# Patient Record
Sex: Female | Born: 1940
Health system: Southern US, Community
[De-identification: ages and names within clinical notes are randomized; demographics above are authoritative.]

## PROBLEM LIST (undated history)

## (undated) DIAGNOSIS — R600 Localized edema: Secondary | ICD-10-CM

## (undated) DIAGNOSIS — H35033 Hypertensive retinopathy, bilateral: Secondary | ICD-10-CM

## (undated) DIAGNOSIS — Z9889 Other specified postprocedural states: Secondary | ICD-10-CM

## (undated) DIAGNOSIS — Z9289 Personal history of other medical treatment: Secondary | ICD-10-CM

## (undated) DIAGNOSIS — I350 Nonrheumatic aortic (valve) stenosis: Secondary | ICD-10-CM

## (undated) DIAGNOSIS — K220 Achalasia of cardia: Secondary | ICD-10-CM

## (undated) DIAGNOSIS — M1990 Unspecified osteoarthritis, unspecified site: Secondary | ICD-10-CM

## (undated) DIAGNOSIS — Z8669 Personal history of other diseases of the nervous system and sense organs: Secondary | ICD-10-CM

## (undated) DIAGNOSIS — K219 Gastro-esophageal reflux disease without esophagitis: Secondary | ICD-10-CM

## (undated) DIAGNOSIS — D649 Anemia, unspecified: Secondary | ICD-10-CM

## (undated) DIAGNOSIS — R112 Nausea with vomiting, unspecified: Secondary | ICD-10-CM

## (undated) DIAGNOSIS — I10 Essential (primary) hypertension: Secondary | ICD-10-CM

## (undated) DIAGNOSIS — I4892 Unspecified atrial flutter: Secondary | ICD-10-CM

## (undated) DIAGNOSIS — M62838 Other muscle spasm: Secondary | ICD-10-CM

## (undated) DIAGNOSIS — K5900 Constipation, unspecified: Secondary | ICD-10-CM

## (undated) DIAGNOSIS — I051 Rheumatic mitral insufficiency: Secondary | ICD-10-CM

## (undated) DIAGNOSIS — I099 Rheumatic heart disease, unspecified: Secondary | ICD-10-CM

## (undated) DIAGNOSIS — R609 Edema, unspecified: Secondary | ICD-10-CM

## (undated) DIAGNOSIS — R351 Nocturia: Secondary | ICD-10-CM

## (undated) DIAGNOSIS — Z8619 Personal history of other infectious and parasitic diseases: Secondary | ICD-10-CM

## (undated) DIAGNOSIS — R011 Cardiac murmur, unspecified: Secondary | ICD-10-CM

## (undated) HISTORY — PX: ABDOMINAL HYSTERECTOMY: SHX81

## (undated) HISTORY — DX: Achalasia of cardia: K22.0

## (undated) HISTORY — DX: Unspecified atrial flutter: I48.92

## (undated) HISTORY — DX: Hypertensive retinopathy, bilateral: H35.033

## (undated) HISTORY — PX: MULTIPLE TOOTH EXTRACTIONS: SHX2053

## (undated) HISTORY — DX: Rheumatic heart disease, unspecified: I09.9

## (undated) HISTORY — DX: Unspecified osteoarthritis, unspecified site: M19.90

## (undated) HISTORY — DX: Essential (primary) hypertension: I10

## (undated) HISTORY — PX: COLONOSCOPY W/ BIOPSIES AND POLYPECTOMY: SHX1376

## (undated) HISTORY — DX: Nonrheumatic aortic (valve) stenosis: I35.0

## (undated) HISTORY — PX: CYST EXCISION: SHX5701

## (undated) HISTORY — DX: Cardiac murmur, unspecified: R01.1

## (undated) HISTORY — DX: Rheumatic mitral insufficiency: I05.1

---

## 1997-12-11 ENCOUNTER — Encounter: Admission: RE | Admit: 1997-12-11 | Discharge: 1997-12-11 | Payer: Self-pay | Admitting: Obstetrics & Gynecology

## 1998-09-13 ENCOUNTER — Encounter: Admission: RE | Admit: 1998-09-13 | Discharge: 1998-09-13 | Payer: Self-pay | Admitting: Internal Medicine

## 1998-10-21 ENCOUNTER — Encounter: Admission: RE | Admit: 1998-10-21 | Discharge: 1998-10-21 | Payer: Self-pay | Admitting: Internal Medicine

## 1998-12-03 ENCOUNTER — Encounter: Admission: RE | Admit: 1998-12-03 | Discharge: 1998-12-03 | Payer: Self-pay | Admitting: *Deleted

## 1999-02-04 ENCOUNTER — Encounter: Admission: RE | Admit: 1999-02-04 | Discharge: 1999-02-04 | Payer: Self-pay | Admitting: Internal Medicine

## 1999-02-14 ENCOUNTER — Encounter: Admission: RE | Admit: 1999-02-14 | Discharge: 1999-02-14 | Payer: Self-pay | Admitting: Internal Medicine

## 1999-09-26 ENCOUNTER — Encounter: Admission: RE | Admit: 1999-09-26 | Discharge: 1999-09-26 | Payer: Self-pay | Admitting: Internal Medicine

## 1999-09-30 ENCOUNTER — Encounter: Admission: RE | Admit: 1999-09-30 | Discharge: 1999-09-30 | Payer: Self-pay | Admitting: Internal Medicine

## 2000-11-13 ENCOUNTER — Encounter: Admission: RE | Admit: 2000-11-13 | Discharge: 2000-11-13 | Payer: Self-pay | Admitting: Obstetrics & Gynecology

## 2000-11-22 ENCOUNTER — Encounter: Admission: RE | Admit: 2000-11-22 | Discharge: 2000-11-22 | Payer: Self-pay | Admitting: Internal Medicine

## 2000-12-04 ENCOUNTER — Ambulatory Visit (HOSPITAL_COMMUNITY): Admission: RE | Admit: 2000-12-04 | Discharge: 2000-12-04 | Payer: Self-pay

## 2000-12-07 ENCOUNTER — Encounter: Payer: Self-pay | Admitting: Obstetrics

## 2000-12-07 ENCOUNTER — Encounter: Admission: RE | Admit: 2000-12-07 | Discharge: 2000-12-07 | Payer: Self-pay | Admitting: Obstetrics

## 2000-12-07 ENCOUNTER — Encounter: Admission: RE | Admit: 2000-12-07 | Discharge: 2000-12-07 | Payer: Self-pay | Admitting: Internal Medicine

## 2001-01-31 ENCOUNTER — Encounter: Admission: RE | Admit: 2001-01-31 | Discharge: 2001-01-31 | Payer: Self-pay | Admitting: Internal Medicine

## 2001-04-26 ENCOUNTER — Encounter: Admission: RE | Admit: 2001-04-26 | Discharge: 2001-04-26 | Payer: Self-pay

## 2001-05-31 ENCOUNTER — Encounter: Admission: RE | Admit: 2001-05-31 | Discharge: 2001-05-31 | Payer: Self-pay | Admitting: Internal Medicine

## 2002-01-06 ENCOUNTER — Encounter: Admission: RE | Admit: 2002-01-06 | Discharge: 2002-01-06 | Payer: Self-pay | Admitting: Internal Medicine

## 2002-01-07 ENCOUNTER — Encounter: Admission: RE | Admit: 2002-01-07 | Discharge: 2002-01-07 | Payer: Self-pay | Admitting: Internal Medicine

## 2002-01-21 ENCOUNTER — Encounter: Payer: Self-pay | Admitting: Internal Medicine

## 2002-01-21 ENCOUNTER — Ambulatory Visit (HOSPITAL_COMMUNITY): Admission: RE | Admit: 2002-01-21 | Discharge: 2002-01-21 | Payer: Self-pay | Admitting: Internal Medicine

## 2003-04-14 ENCOUNTER — Ambulatory Visit (HOSPITAL_COMMUNITY): Admission: RE | Admit: 2003-04-14 | Discharge: 2003-04-14 | Payer: Self-pay | Admitting: Sports Medicine

## 2003-06-12 ENCOUNTER — Encounter: Admission: RE | Admit: 2003-06-12 | Discharge: 2003-06-12 | Payer: Self-pay | Admitting: Internal Medicine

## 2003-06-12 ENCOUNTER — Encounter (INDEPENDENT_AMBULATORY_CARE_PROVIDER_SITE_OTHER): Payer: Self-pay | Admitting: Internal Medicine

## 2003-06-16 ENCOUNTER — Encounter: Admission: RE | Admit: 2003-06-16 | Discharge: 2003-06-16 | Payer: Self-pay | Admitting: Internal Medicine

## 2003-07-17 ENCOUNTER — Ambulatory Visit (HOSPITAL_COMMUNITY): Admission: RE | Admit: 2003-07-17 | Discharge: 2003-07-17 | Payer: Self-pay | Admitting: Internal Medicine

## 2003-07-17 ENCOUNTER — Encounter: Admission: RE | Admit: 2003-07-17 | Discharge: 2003-07-17 | Payer: Self-pay | Admitting: Internal Medicine

## 2003-07-31 ENCOUNTER — Encounter: Admission: RE | Admit: 2003-07-31 | Discharge: 2003-07-31 | Payer: Self-pay | Admitting: Internal Medicine

## 2003-09-13 DIAGNOSIS — Z9189 Other specified personal risk factors, not elsewhere classified: Secondary | ICD-10-CM | POA: Insufficient documentation

## 2003-11-20 ENCOUNTER — Ambulatory Visit: Payer: Self-pay | Admitting: Internal Medicine

## 2004-03-30 ENCOUNTER — Encounter: Admission: RE | Admit: 2004-03-30 | Discharge: 2004-03-30 | Payer: Self-pay | Admitting: Internal Medicine

## 2004-11-17 ENCOUNTER — Encounter: Admission: RE | Admit: 2004-11-17 | Discharge: 2004-11-17 | Payer: Self-pay | Admitting: Internal Medicine

## 2004-12-07 ENCOUNTER — Ambulatory Visit: Payer: Self-pay | Admitting: Internal Medicine

## 2004-12-13 ENCOUNTER — Ambulatory Visit: Payer: Self-pay | Admitting: Internal Medicine

## 2005-11-02 ENCOUNTER — Ambulatory Visit: Payer: Self-pay | Admitting: Internal Medicine

## 2005-11-16 ENCOUNTER — Encounter (INDEPENDENT_AMBULATORY_CARE_PROVIDER_SITE_OTHER): Payer: Self-pay | Admitting: *Deleted

## 2005-11-16 ENCOUNTER — Ambulatory Visit: Payer: Self-pay | Admitting: Internal Medicine

## 2005-11-16 LAB — CONVERTED CEMR LAB
Calcium: 9.7 mg/dL (ref 8.4–10.5)
Chlamydia, DNA Probe: NEGATIVE
Cholesterol: 164 mg/dL (ref 0–200)
Creatinine, Ser: 0.6 mg/dL (ref 0.40–1.20)
GC Probe Amp, Genital: NEGATIVE
Glucose, Bld: 103 mg/dL — ABNORMAL HIGH (ref 70–99)
HDL: 41 mg/dL (ref >39)
Ketones, ur: NEGATIVE mg/dL
Leukocytes, UA: NEGATIVE
Nitrite: NEGATIVE
Sodium: 140 meq/L (ref 135–145)
Specific Gravity, Urine: 1.018 (ref 1.005–1.03)
Total CHOL/HDL Ratio: 4
Triglycerides: 84 mg/dL (ref <150)
Urobilinogen, UA: 0.2 (ref 0.0–1.0)

## 2005-11-20 ENCOUNTER — Ambulatory Visit: Payer: Self-pay | Admitting: Hospitalist

## 2005-11-20 DIAGNOSIS — I10 Essential (primary) hypertension: Secondary | ICD-10-CM | POA: Insufficient documentation

## 2005-11-20 DIAGNOSIS — K5900 Constipation, unspecified: Secondary | ICD-10-CM | POA: Insufficient documentation

## 2005-11-27 ENCOUNTER — Ambulatory Visit (HOSPITAL_COMMUNITY): Admission: RE | Admit: 2005-11-27 | Discharge: 2005-11-27 | Payer: Self-pay | Admitting: Internal Medicine

## 2005-11-30 ENCOUNTER — Ambulatory Visit: Payer: Self-pay | Admitting: Internal Medicine

## 2006-01-12 DIAGNOSIS — M8949 Other hypertrophic osteoarthropathy, multiple sites: Secondary | ICD-10-CM | POA: Insufficient documentation

## 2006-01-12 DIAGNOSIS — R7301 Impaired fasting glucose: Secondary | ICD-10-CM | POA: Insufficient documentation

## 2006-01-12 DIAGNOSIS — M159 Polyosteoarthritis, unspecified: Secondary | ICD-10-CM | POA: Insufficient documentation

## 2006-01-12 DIAGNOSIS — M150 Primary generalized (osteo)arthritis: Secondary | ICD-10-CM

## 2006-05-02 ENCOUNTER — Encounter (INDEPENDENT_AMBULATORY_CARE_PROVIDER_SITE_OTHER): Payer: Self-pay | Admitting: *Deleted

## 2006-05-02 ENCOUNTER — Ambulatory Visit: Payer: Self-pay | Admitting: Hospitalist

## 2006-05-02 DIAGNOSIS — N760 Acute vaginitis: Secondary | ICD-10-CM | POA: Insufficient documentation

## 2006-05-02 LAB — CONVERTED CEMR LAB
Chlamydia, DNA Probe: NEGATIVE
GC Probe Amp, Genital: NEGATIVE
Hemoglobin, Urine: NEGATIVE
Ketones, ur: NEGATIVE mg/dL
Nitrite: NEGATIVE
Protein, ur: NEGATIVE mg/dL
pH: 6 (ref 5.0–8.0)

## 2006-05-03 ENCOUNTER — Encounter (INDEPENDENT_AMBULATORY_CARE_PROVIDER_SITE_OTHER): Payer: Self-pay | Admitting: *Deleted

## 2006-05-03 LAB — CONVERTED CEMR LAB: Gardnerella vaginalis: NEGATIVE

## 2006-07-23 ENCOUNTER — Telehealth (INDEPENDENT_AMBULATORY_CARE_PROVIDER_SITE_OTHER): Payer: Self-pay | Admitting: *Deleted

## 2006-12-03 ENCOUNTER — Ambulatory Visit (HOSPITAL_COMMUNITY): Admission: RE | Admit: 2006-12-03 | Discharge: 2006-12-03 | Payer: Self-pay | Admitting: Internal Medicine

## 2007-02-13 ENCOUNTER — Telehealth: Payer: Self-pay | Admitting: *Deleted

## 2007-02-18 ENCOUNTER — Ambulatory Visit: Payer: Self-pay | Admitting: Internal Medicine

## 2007-02-18 ENCOUNTER — Encounter: Payer: Self-pay | Admitting: Internal Medicine

## 2007-02-19 DIAGNOSIS — E876 Hypokalemia: Secondary | ICD-10-CM | POA: Insufficient documentation

## 2007-02-19 LAB — CONVERTED CEMR LAB
ALT: 27 units/L (ref 0–35)
CO2: 24 meq/L (ref 19–32)
Calcium: 9.3 mg/dL (ref 8.4–10.5)
Candida species: POSITIVE — AB
Chloride: 104 meq/L (ref 96–112)
Cholesterol: 135 mg/dL (ref 0–200)
Creatinine, Ser: 0.72 mg/dL (ref 0.40–1.20)
Glucose, Bld: 98 mg/dL (ref 70–99)
Sodium: 143 meq/L (ref 135–145)
Total Protein: 6.7 g/dL (ref 6.0–8.3)
Trichomonal Vaginitis: NEGATIVE

## 2007-03-20 ENCOUNTER — Encounter (INDEPENDENT_AMBULATORY_CARE_PROVIDER_SITE_OTHER): Payer: Self-pay | Admitting: Internal Medicine

## 2007-03-20 ENCOUNTER — Ambulatory Visit: Payer: Self-pay | Admitting: Infectious Diseases

## 2007-03-20 LAB — CONVERTED CEMR LAB
BUN: 9 mg/dL (ref 6–23)
Calcium: 9.4 mg/dL (ref 8.4–10.5)
Creatinine, Ser: 0.59 mg/dL (ref 0.40–1.20)

## 2007-03-25 LAB — CONVERTED CEMR LAB
Candida species: NEGATIVE
Trichomonal Vaginitis: NEGATIVE

## 2007-04-02 ENCOUNTER — Ambulatory Visit: Payer: Self-pay | Admitting: Internal Medicine

## 2007-04-09 ENCOUNTER — Encounter: Payer: Self-pay | Admitting: Internal Medicine

## 2007-04-09 ENCOUNTER — Ambulatory Visit (HOSPITAL_COMMUNITY): Admission: RE | Admit: 2007-04-09 | Discharge: 2007-04-09 | Payer: Self-pay | Admitting: Internal Medicine

## 2007-04-17 ENCOUNTER — Ambulatory Visit: Payer: Self-pay | Admitting: Internal Medicine

## 2007-04-23 ENCOUNTER — Telehealth: Payer: Self-pay | Admitting: *Deleted

## 2007-04-26 ENCOUNTER — Encounter (INDEPENDENT_AMBULATORY_CARE_PROVIDER_SITE_OTHER): Payer: Self-pay | Admitting: *Deleted

## 2007-07-31 ENCOUNTER — Encounter: Payer: Self-pay | Admitting: Internal Medicine

## 2007-10-22 ENCOUNTER — Ambulatory Visit: Payer: Self-pay | Admitting: Internal Medicine

## 2007-10-28 ENCOUNTER — Encounter: Payer: Self-pay | Admitting: Infectious Disease

## 2007-12-20 ENCOUNTER — Ambulatory Visit: Payer: Self-pay | Admitting: Internal Medicine

## 2008-01-14 ENCOUNTER — Ambulatory Visit (HOSPITAL_COMMUNITY): Admission: RE | Admit: 2008-01-14 | Discharge: 2008-01-14 | Payer: Self-pay | Admitting: Internal Medicine

## 2008-02-10 ENCOUNTER — Telehealth (INDEPENDENT_AMBULATORY_CARE_PROVIDER_SITE_OTHER): Payer: Self-pay | Admitting: Internal Medicine

## 2008-04-24 ENCOUNTER — Encounter: Payer: Self-pay | Admitting: Internal Medicine

## 2008-07-08 ENCOUNTER — Encounter: Payer: Self-pay | Admitting: Internal Medicine

## 2008-07-08 ENCOUNTER — Telehealth: Payer: Self-pay | Admitting: Internal Medicine

## 2008-10-16 ENCOUNTER — Encounter: Payer: Self-pay | Admitting: Internal Medicine

## 2008-10-16 ENCOUNTER — Encounter: Admission: RE | Admit: 2008-10-16 | Discharge: 2008-10-16 | Payer: Self-pay | Admitting: Family Medicine

## 2008-10-27 ENCOUNTER — Telehealth: Payer: Self-pay | Admitting: Internal Medicine

## 2008-10-29 ENCOUNTER — Telehealth (INDEPENDENT_AMBULATORY_CARE_PROVIDER_SITE_OTHER): Payer: Self-pay | Admitting: *Deleted

## 2009-02-01 ENCOUNTER — Telehealth: Payer: Self-pay | Admitting: Internal Medicine

## 2009-02-03 ENCOUNTER — Ambulatory Visit: Payer: Self-pay | Admitting: Internal Medicine

## 2009-02-03 ENCOUNTER — Telehealth: Payer: Self-pay | Admitting: *Deleted

## 2009-02-03 LAB — CONVERTED CEMR LAB
Potassium: 3.9 meq/L (ref 3.5–5.3)
Sodium: 139 meq/L (ref 135–145)

## 2009-02-11 ENCOUNTER — Ambulatory Visit (HOSPITAL_COMMUNITY): Admission: RE | Admit: 2009-02-11 | Discharge: 2009-02-11 | Payer: Self-pay | Admitting: Internal Medicine

## 2009-07-13 ENCOUNTER — Telehealth: Payer: Self-pay | Admitting: Internal Medicine

## 2010-01-23 ENCOUNTER — Encounter: Payer: Self-pay | Admitting: Internal Medicine

## 2010-02-01 NOTE — Progress Notes (Signed)
Summary: refill/ hla  Phone Note Refill Request Message from:  Fax from Pharmacy on July 13, 2009 12:22 PM  Refills Requested: Medication #1:  NORVASC 5 MG  TABS take 1 pill by mouth daily.   Dosage confirmed as above?Dosage Confirmed   Last Refilled: 4/30 last visit and lab 02/03/2009  Initial call taken by: Marin Roberts RN,  July 13, 2009 12:23 PM  Follow-up for Phone Call        completed refill, thank you Iskra  Follow-up by: Mliss Sax MD,  July 13, 2009 2:45 PM    Prescriptions: NORVASC 5 MG  TABS (AMLODIPINE BESYLATE) take 1 pill by mouth daily.  #31 x 11   Entered and Authorized by:   Mliss Sax MD   Signed by:   Mliss Sax MD on 07/13/2009   Method used:   Electronically to        [facility]  [phone]* (retail)       [address]       [place], [place]  [address]       Ph: [phone] or [phone]       Fax: [phone]   RxID:   [id number] HYDROCHLOROTHIAZIDE 25 MG TABS (HYDROCHLOROTHIAZIDE) Take 1 tablet by mouth once a day for high blood pressure  #90 x 11   Entered and Authorized by:   Mliss Sax MD   Signed by:   Mliss Sax MD on 07/13/2009   Method used:   Electronically to        [facility]  [phone]* (retail)       [address]       [place], [place]  [address]       Ph: [phone] or [phone]       Fax: [phone]   RxID:   [id number] LISINOPRIL 40 MG TABS (LISINOPRIL) Take 1 tablet by mouth once a day for high blood pressure  #90 x 11   Entered and Authorized by:   Mliss Sax MD   Signed by:   Mliss Sax MD on 07/13/2009   Method used:   Electronically to        [facility]  [phone]* (retail)       [address]       [place], [place]  [address]       Ph: [phone] or [phone]       Fax: [phone]   RxID:   [id number] ATENOLOL 100 MG TABS (ATENOLOL) Take 1 tablet by mouth once daily for high  blood pressure  #90 x 11   Entered and Authorized by:   Mliss Sax MD   Signed by:   Mliss Sax MD on 07/13/2009   Method used:   Electronically to        [facility]  [phone]* (retail)       [address]       [place], [place]  [address]       Ph: [phone] or [phone]       Fax: [phone]   RxID:   [id number]

## 2010-02-01 NOTE — Progress Notes (Signed)
Summary: refill  Phone Note Refill Request   call about premarin in the AM.  cost $ 80  Initial call taken by: Merrie Roof RN,  February 03, 2009 5:07 PM

## 2010-02-01 NOTE — Assessment & Plan Note (Signed)
Summary: per Venita Sheffield, pt. need vignal creme refill [mkj]   Vital Signs:  Patient profile:   70 year old female Height:      64 inches (162.56 cm) Weight:      222.6 pounds (101.18 kg) BMI:     38.35 Temp:     97.1 degrees F oral Pulse rate:   57 / minute BP sitting:   133 / 79  (right arm)  Vitals Entered By: Chinita Pester RN (February 03, 2009 3:28 PM) CC: Need refill on Vaginal cream. Is Patient Diabetic? No Pain Assessment Patient in pain? no      Nutritional Status BMI of > 30 = obese  Have you ever been in a relationship where you felt threatened, hurt or afraid?No   Does patient need assistance? Functional Status Self care Ambulation Normal   Primary Care Provider:  Madelaine Etienne MD  CC:  Need refill on Vaginal cream..  History of Present Illness: 70 yo female with PMH of HTN and OA came in for regular follow up appointment and needs a refill for her Estrace cream of which she has been taking couple of years ago secondary to vaginal pruritis. Pt has noticed increased vaginal itching since last week. No associated discharge, or odour.   No other complaints or concerns today.   Preventive Screening-Counseling & Management  Alcohol-Tobacco     Alcohol drinks/day: 0     Smoking Status: never     Passive Smoke Exposure: no  Caffeine-Diet-Exercise     Does Patient Exercise: yes     Type of exercise: stretching  exercises  Bone Scan  Procedure date:  10/16/2008  Findings:      abnormal:    Current Medications (verified): 1)  Hydrochlorothiazide 25 Mg Tabs (Hydrochlorothiazide) .... Take 1 Tablet By Mouth Once A Day For High Blood Pressure 2)  Atenolol 100 Mg Tabs (Atenolol) .... Take 1 Tablet By Mouth Once Daily For High Blood Pressure 3)  Lisinopril 40 Mg Tabs (Lisinopril) .... Take 1 Tablet By Mouth Once A Day For High Blood Pressure 4)  Metamucil 30.9 % Powd (Psyllium) .... Dissolve 2 Scoops in 8 Ounces of Water and Drink Once A Day For Constipation 5)   Estrace 0.1 Mg/gm  Crea (Estradiol) .... Insert The Equivalent of 0.3 Mg Into The Vagina Using The Provided Applicator (Fill To The 3 Gram Line) Twice A Week 6)  Norvasc 5 Mg  Tabs (Amlodipine Besylate) .... Take 1 Pill By Mouth Daily. 7)  Premarin 0.625 Mg/gm Crea (Estrogens, Conjugated) .... Inject Intra-Vaginally W/applicator Once Daily  At Bedtime -3 Weeks On 1 Week Off. 8)  Caltrate 600+d 600-400 Mg-Unit Tabs (Calcium Carbonate-Vitamin D) .... 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 CV:  Denies chest pain or discomfort, fatigue, lightheadness, palpitations, and swelling of feet.  Resp:  Denies chest pain with inspiration and cough. GI:  Denies abdominal pain and change in bowel habits. GU:  Denies abnormal vaginal bleeding, decreased libido, discharge, dysuria, nocturia, urinary frequency, and urinary hesitancy; increased vaginal itching. MS:  Denies joint pain.   Impression & Recommendations:  Problem # 1:  VAGINITIS NOS (ICD-616.10) Assessment Unchanged Pt has been on Estrace vaginal cream, reports she has run out of this cream. Performed vaginal exam, did not reveal any ulcers, lesions,  discharge or odor. Pt reports that she does not use this cream regularly and that she uses it 1-2 times a week. As discussed with Dr. Meredith Pel, the dosing is adequate as patient does not use more than recommended dose.   Plan: refill Estrace  Problem # 2:  HYPERTENSION (ICD-401.9) Assessment: Unchanged  Blood pressure well controlled under current regimen. Will continue, and check BMet to assess renal function.   Her updated medication list for this problem includes:    Hydrochlorothiazide 25 Mg Tabs (Hydrochlorothiazide) .[name]... Take 1 tablet by mouth once a day for high blood pressure    Atenolol 100 Mg Tabs (Atenolol) .[name]... Take 1 tablet by mouth once daily for high blood pressure    Lisinopril 40 Mg Tabs (Lisinopril) .[name]... Take 1 tablet by mouth once a day for high blood pressure    Norvasc 5 Mg Tabs (Amlodipine besylate) .[name]... Take 1 pill by mouth daily.  BP today: 133/79 Prior BP: 139/87 (12/20/2007)  Prior 10 Yr Risk Heart Disease: 13 % (04/17/2007)  Labs Reviewed: K+: 3.9 (03/20/2007) Creat: : 0.59 (03/20/2007)   Chol: 135 (02/18/2007)   HDL: 38 (02/18/2007)   LDL: 71 (02/18/2007)   TG: 131 (02/18/2007)  Orders: T-Basic Metabolic Panel [id number])  Complete Medication List: 1)  Hydrochlorothiazide 25 Mg Tabs (Hydrochlorothiazide) .... Take 1 tablet by mouth once a day for high blood pressure 2)  Atenolol 100 Mg Tabs (Atenolol) .... Take 1 tablet by mouth once daily for high blood pressure 3)  Lisinopril 40 Mg Tabs (Lisinopril) .... Take 1 tablet by mouth once a day for high blood pressure 4)  Metamucil 30.9 % Powd (Psyllium) .... Dissolve 2 scoops in 8 ounces of water and drink once a day for constipation 5)  Estrace 0.1 Mg/gm Crea (Estradiol) .... Insert the equivalent of 0.3 mg into the vagina using the provided applicator (fill to the 3 gram line) twice a week 6)  Norvasc 5 Mg Tabs (Amlodipine besylate) .... Take 1 pill by mouth daily. 7)  Premarin 0.625 Mg/gm Crea  (Estrogens, conjugated) .... Inject intra-vaginally w/applicator once daily  at bedtime -3 weeks on 1 week off. 8)  Caltrate 600+d 600-400 Mg-unit Tabs (Calcium carbonate-vitamin d) .... Take 1 tablet by mouth once a day  Other Orders: Mammogram (Screening) (Mammo)  Patient Instructions: 1)  Please schedule a follow-up appointment in 6 months. 2)  Please take all medications as directed.  Prescriptions: PREMARIN 0.625 MG/GM CREA (ESTROGENS, CONJUGATED) Inject intra-vaginally w/applicator once daily  at bedtime -3 weeks on 1 week off.  #42.5 g x 3   Entered and Authorized by:   Melida Quitter MD   Signed by:   Melida Quitter MD on 02/03/2009   Method used:   Electronically to        [facility]  [phone]* (retail)       [address]       [place], [place]  [address]  Ph: [phone] or [phone]       Fax: [phone]   RxID:   [id number] NORVASC 5 MG  TABS (AMLODIPINE BESYLATE) take 1 pill by mouth daily.  #31 x 3   Entered and Authorized by:   Melida Quitter MD   Signed by:   Melida Quitter MD on 02/03/2009   Method used:   Electronically to        [facility]  [phone]* (retail)       [address]       [place], [place]  [address]       Ph: [phone] or [phone]       Fax: [phone]   RxID:   [id number] ESTRACE 0.1 MG/GM  CREA (ESTRADIOL) Insert the equivalent of 0.3 mg into the vagina using the provided applicator (fill to the 3 gram line) twice a week  #45 Grams x 0   Entered and Authorized by:   Melida Quitter MD   Signed by:   Melida Quitter MD on 02/03/2009   Method used:   Electronically to        [facility]  [phone]* (retail)       [address]       [place], [place]  [address]       Ph: [phone] or [phone]       Fax: [phone]   RxID:   [id number] LISINOPRIL 40 MG TABS (LISINOPRIL) Take 1 tablet by mouth once a day for  high blood pressure  #90 x 3   Entered and Authorized by:   Melida Quitter MD   Signed by:   Melida Quitter MD on 02/03/2009   Method used:   Electronically to        [facility]  [phone]* (retail)       [address]       [place], [place]  [address]       Ph: [phone] or [phone]       Fax: [phone]   RxID:   [id number] ATENOLOL 100 MG TABS (ATENOLOL) Take 1 tablet by mouth once daily for high blood pressure  #90 x 3   Entered and Authorized by:   Melida Quitter MD   Signed by:   Melida Quitter MD on 02/03/2009   Method used:   Electronically to        [facility]  [phone]* (retail)       [address]       [place], [place]  [address]       Ph: [phone] or [phone]       Fax: [phone]   RxID:   [id number] HYDROCHLOROTHIAZIDE 25 MG TABS (HYDROCHLOROTHIAZIDE) Take 1 tablet by mouth once a day for high blood pressure  #90 x 3   Entered and Authorized by:   Melida Quitter MD   Signed by:   Melida Quitter MD on 02/03/2009   Method used:   Electronically to        [facility]  [phone]* (retail)       [address]       [place], [place]  [address]       Ph: [phone] or [phone]       Fax: [phone]  RxID:   [id number]   Prevention & Chronic Care Immunizations   Influenza vaccine: Not documented    Tetanus booster: Not documented    Pneumococcal vaccine: Not documented    H. zoster vaccine: Not documented  Colorectal Screening   Hemoccult: Not documented    Colonoscopy: Not documented  Other Screening   Pap smear: Not documented   Pap smear action/deferral: Not indicated S/P hysterectomy  (02/03/2009)    Mammogram: ASSESSMENT: Negative - BI-RADS 1^MM DIGITAL SCREENING  (01/14/2008)   Mammogram action/deferral: Ordered  (02/03/2009)    DXA bone density scan: Not documented  Reports requested:   Last DXA report  requested.  Smoking status: never  (02/03/2009)  Lipids   Total Cholesterol: 135  (02/18/2007)   LDL: 71  (02/18/2007)   LDL Direct: Not documented   HDL: 38  (02/18/2007)   Triglycerides: 131  (02/18/2007)  Hypertension   Last Blood Pressure: 133 / 79  (02/03/2009)   Serum creatinine: 0.59  (03/20/2007)   Serum potassium 3.9  (03/20/2007)    Hypertension flowsheet reviewed?: Yes   Progress toward BP goal: At goal  Self-Management Support :    Patient will work on the following items until the next clinic visit to reach self-care goals:     Medications and monitoring: check my blood pressure, bring all of my medications to every visit  (02/03/2009)     Activity: take a 30 minute walk every day  (02/03/2009)    Hypertension self-management support: BP self-monitoring log, Written self-care plan, Education handout  (02/03/2009)   Hypertension self-care plan printed.   Hypertension education handout printed   Nursing Instructions: Schedule screening mammogram (see order) Request report of last DXA scan   Process Orders Check Orders Results:     [facility]: Check successful Tests Sent for requisitioning (February 03, 2009 4:28 PM):     02/03/2009: [facility] -- T-Basic Metabolic Panel [id number] (signed)    Process Orders Check Orders Results:     [facility]: Check successful Tests Sent for requisitioning (February 03, 2009 4:28 PM):     02/03/2009: [facility] -- T-Basic Metabolic Panel [id number] (signed)

## 2010-02-01 NOTE — Progress Notes (Signed)
Summary: Refill/gh  Phone Note Refill Request Message from:  Patient on February 01, 2009 3:17 PM  Refills Requested: Medication #1:  ESTRACE 0.1 MG/GM  CREA Insert the equivalent of 0.3 mg into the vagina using the provided applicator (fill to the 3 gram line) every day for 3 weeks   Dosage confirmed as above?Dosage Confirmed   Supply Requested: 1 month   Last Refilled: 11/02/2008 Pt was advised that she will need to schedule an appointment. Pt was given an appointment for 02/04/2009.  Last office visit was 12/09. Last labs were 2/09.   Method Requested: Electronic Initial call taken by: Angelina Ok RN,  February 01, 2009 3:17 PM    New/Updated Medications: ESTRACE 0.1 MG/GM  CREA (ESTRADIOL) Insert the equivalent of 0.3 mg into the vagina using the provided applicator (fill to the 3 gram line) twice a week Prescriptions: ESTRACE 0.1 MG/GM  CREA (ESTRADIOL) Insert the equivalent of 0.3 mg into the vagina using the provided applicator (fill to the 3 gram line) twice a week  #45 Grams x 0   Entered and Authorized by:   Doneen Poisson MD   Signed by:   Doneen Poisson MD on 02/01/2009   Method used:   Electronically to        [facility]  [phone]* (retail)       [address]       [place], [place]  [address]       Ph: [phone] or [phone]       Fax: [phone]   RxID:   [id number]

## 2010-02-10 ENCOUNTER — Encounter: Payer: Self-pay | Admitting: Internal Medicine

## 2010-03-04 ENCOUNTER — Other Ambulatory Visit: Payer: Self-pay | Admitting: Family Medicine

## 2010-03-04 DIAGNOSIS — Z1231 Encounter for screening mammogram for malignant neoplasm of breast: Secondary | ICD-10-CM

## 2010-03-24 ENCOUNTER — Ambulatory Visit (HOSPITAL_COMMUNITY)
Admission: RE | Admit: 2010-03-24 | Discharge: 2010-03-24 | Disposition: A | Discharge status: Home / Self Care | Payer: MEDICARE | Source: Ambulatory Visit | Attending: Family Medicine | Admitting: Family Medicine

## 2010-03-24 DIAGNOSIS — Z1231 Encounter for screening mammogram for malignant neoplasm of breast: Secondary | ICD-10-CM | POA: Insufficient documentation

## 2010-07-12 ENCOUNTER — Other Ambulatory Visit: Payer: Self-pay | Admitting: *Deleted

## 2010-07-12 MED ORDER — AMLODIPINE BESYLATE 5 MG PO TABS: 5.0000 mg | ORAL_TABLET | Freq: Every day | ORAL | Status: DC

## 2010-07-12 NOTE — Telephone Encounter (Signed)
Pt requests 90 day supply

## 2010-10-14 ENCOUNTER — Other Ambulatory Visit: Payer: Self-pay | Admitting: Internal Medicine

## 2010-10-17 NOTE — Telephone Encounter (Signed)
Has appt in couple of days. Will refill.

## 2010-10-21 ENCOUNTER — Encounter: Payer: Self-pay | Admitting: Ophthalmology

## 2010-10-21 ENCOUNTER — Ambulatory Visit (INDEPENDENT_AMBULATORY_CARE_PROVIDER_SITE_OTHER): Payer: Medicare Other | Admitting: Ophthalmology

## 2010-10-21 VITALS — BP 123/80 | HR 66 | Temp 97.6°F | Ht 64.0 in | Wt 225.1 lb

## 2010-10-21 DIAGNOSIS — Z23 Encounter for immunization: Secondary | ICD-10-CM

## 2010-10-21 DIAGNOSIS — Z0000 Encounter for general adult medical examination without abnormal findings: Secondary | ICD-10-CM

## 2010-10-21 DIAGNOSIS — M1990 Unspecified osteoarthritis, unspecified site: Secondary | ICD-10-CM

## 2010-10-21 DIAGNOSIS — I10 Essential (primary) hypertension: Secondary | ICD-10-CM

## 2010-10-21 DIAGNOSIS — M810 Age-related osteoporosis without current pathological fracture: Secondary | ICD-10-CM | POA: Insufficient documentation

## 2010-10-21 MED ORDER — LOSARTAN POTASSIUM 25 MG PO TABS: 25.0000 mg | ORAL_TABLET | Freq: Every day | ORAL | Status: DC

## 2010-10-21 NOTE — Progress Notes (Addendum)
Subjective:   Patient ID: [name] female   DOB: [birth year] 70 y.o.   MRN: [id number]  HPI: Knee Pain [name] is a 70 y.o. woman who presented to clinic today after being called during our narcotics review since there was vicodin on her prescription list that we did not prescribe from the clinic. She explained that she sees Dr. Marcene Corning of [facility] for her osteoarthritic knees. He recommended that she have bilateral knee replacements but she is hesitant to do so since she wants to keep working. He is providing knee injections Q3 months and BID vicodin. She reports that the shot and vicodin are helping with the pain quite a bit but she still has morning stiffness that takes about 30 minutes to work out. I asked if she had taken advil or aleeve to help with inflammation. She hasn't taken NSAIDs regularly. She also admits to some constipation, I advised her that this is a known side effect of vicodin. She is taking OTC dulcolax which is producing daily BM with BID dosing.  HTN  Patient called for new prescriptions and it was noted that she is taking lisinopril even though she has a documented history of allergies to ACE-inhibitors. I stopped this medication today since she does admit to having some cough managed with OTC Delsym cough syrup.    Past Medical History  Diagnosis Date  . Hypertension   . OA (osteoarthritis)   . Breast mass 2005    mammogram 2006 negative for malignancy  . Systolic murmur     per patient report she has had rheumatic fever in childhood  . Osteoporosis, post-menopausal 2010   Current Outpatient Prescriptions  Medication Sig Dispense Refill  . amLODipine (NORVASC) 5 MG tablet Take 1 tablet (5 mg total) by mouth daily.  90 tablet  11  . atenolol (TENORMIN) 100 MG tablet Take 1 tablet (100 mg total) by mouth daily.  90 tablet  1  . Calcium Carbonate-Vitamin D (CALTRATE 600+D) 600-400 MG-UNIT per tablet Take 1 tablet by mouth daily.         [name] conjugated estrogens (PREMARIN) vaginal cream Place vaginally daily. 3 weeks on and 1 week off       . losartan (COZAAR) 25 MG tablet Take 25 mg by mouth daily.        . meloxicam (MOBIC) 7.5 MG tablet Take 7.5 mg by mouth daily.        . hydrochlorothiazide (HYDRODIURIL) 25 MG tablet Take 1 tablet (25 mg total) by mouth daily.  90 tablet  1   Family History  Problem Relation Age of Onset  . Cancer Mother   . Stroke Father    History   Social History  . Marital Status: Married    Spouse Name: N/A    Number of Children: N/A  . Years of Education: N/A   Social History Main Topics  . Smoking status: Former Games developer  . Smokeless tobacco: Not on file  . Alcohol Use: No  . Drug Use: No  . Sexually Active: Yes   Other Topics Concern  . 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   Review of Systems: Respiratory: reports mild cough  Gastrointestinal: reports some constipation and morning gas that is uncomfortable Genitourinary: reports nocturia   Objective:  Physical Exam: Filed Vitals:  10/21/10 1333  BP: 123/80  Pulse: 66  Temp: 97.6 F (36.4 C)  TempSrc: Oral  Height: 5\' 4"  (1.626 m)  Weight: 225 lb 1.6 oz (102.105 kg)  SpO2: 97%   General: pleasant elderly woman sitting in chair HEENT: PERRL, left eyelid is ptotic in comparison to right Cardiac: RRR, systolic murmur Pulm: clear to auscultation bilaterally, moving normal volumes of air Abd: soft, nontender, nondistended, BS present Ext: warm and well perfused, no pedal edema. Knees have crepitus with flexion and pre-patellar fluid present bilaterally. Neuro: alert and oriented X3, cranial nerves II-XII grossly intact, strength and sensation to light touch equal in bilateral upper and lower extremities Assessment & Plan:

## 2010-10-21 NOTE — Patient Instructions (Signed)
Please start cozaar for blood pressure and STOP lisinopril. See if this helps your cough. If insurance doesn't cover please call clinic and we can switch meds. Please start mobic for knee pain and see if it helps with inflammation. You will be contacted to schedule a colonoscopy.

## 2010-10-25 ENCOUNTER — Other Ambulatory Visit: Payer: Self-pay | Admitting: *Deleted

## 2010-10-25 MED ORDER — MELOXICAM 7.5 MG PO TABS: 7.5000 mg | ORAL_TABLET | Freq: Every day | ORAL | Status: DC | PRN

## 2010-11-07 DIAGNOSIS — Z0000 Encounter for general adult medical examination without abnormal findings: Secondary | ICD-10-CM | POA: Insufficient documentation

## 2010-11-07 NOTE — Assessment & Plan Note (Signed)
Patient is 70 years old and has had 1 colonoscopy which is not documented in EPIC or centricity. Patient believes this was more than 10 years ago. Since patient has a good life expectancy I will refer her for colonoscopy. Counseled patient that losing weight would help with both her knee pain and her HTN and may decrease the amount of medications that she would need to take. She admits that her weight has crept up 5 lbs in the last year or two.

## 2010-11-07 NOTE — Assessment & Plan Note (Signed)
Pain is well managed on vicodin and presumably steroid injections from Dr. Jerl Santos. Will start mobic today and monitor patient for any signs of GI upset at follow up visit.

## 2010-11-07 NOTE — Assessment & Plan Note (Signed)
very well controlled. Stopped lisinopril and will start cozaar (ARB) since patient has documented allergy to ACE-inhibitors. Will monitor BP at next visit after this change.

## 2010-11-28 ENCOUNTER — Encounter: Payer: Self-pay | Admitting: Ophthalmology

## 2010-11-28 ENCOUNTER — Ambulatory Visit (INDEPENDENT_AMBULATORY_CARE_PROVIDER_SITE_OTHER): Payer: Medicare Other | Admitting: Ophthalmology

## 2010-11-28 DIAGNOSIS — M1990 Unspecified osteoarthritis, unspecified site: Secondary | ICD-10-CM

## 2010-11-28 DIAGNOSIS — I10 Essential (primary) hypertension: Secondary | ICD-10-CM

## 2010-11-28 MED ORDER — ATENOLOL 100 MG PO TABS: 100.0000 mg | ORAL_TABLET | Freq: Every day | ORAL | Status: DC

## 2010-11-28 MED ORDER — LOSARTAN POTASSIUM 100 MG PO TABS: 100.0000 mg | ORAL_TABLET | Freq: Every day | ORAL | Status: DC

## 2010-11-28 MED ORDER — HYDROCHLOROTHIAZIDE 25 MG PO TABS: 25.0000 mg | ORAL_TABLET | Freq: Every day | ORAL | Status: DC

## 2010-11-28 NOTE — Assessment & Plan Note (Signed)
Patient was on maximum dose of lisinopril so she will likely tolerate maximum dose of cozaar. Instructed patient to take 100mg  of Cozaar from today on since her BP is not well controlled after the switch to ARB.

## 2010-11-28 NOTE — Assessment & Plan Note (Signed)
Patient did not benefit from Mobic so she stopped using it. It could also contribute to her HTN, so I agree with this. She is taking tylenol OTC along with vicodin BID for pain.

## 2010-11-28 NOTE — Progress Notes (Signed)
I have reviewed and discussed the care of this patient in detail with the resident physician including pertinent patient records, physical exam findings and data. I agree with details of this encounter.   

## 2010-11-28 NOTE — Progress Notes (Signed)
  Subjective:   Patient ID: [name] female   DOB: 01/23/1940 70 y.o.   MRN: [id number]  HPI: [name] is a 70 y.o. woman with HTN who presents for 1 month follow up after being switched from lisinopril to cozaar due to cough.  BP: cough is resolved, patient is very happy and says she feel better than she has in a long time. Was started on 25mg  Cozaar.  Osteoarthritis: started taking mobic, irritated stomach and didn't help pain so stopped it for several weeks now. Is taking vicodin from outside orthopedic surgeon and takes tylenol occasionally though she assures me this is no more than 1 pill every few days.  Past Medical History  Diagnosis Date  . Hypertension   . OA (osteoarthritis)   . Breast mass 2005    mammogram 2006 negative for malignancy  . Systolic murmur     per patient report she has had rheumatic fever in childhood  . Osteoporosis, post-menopausal 2010   Current Outpatient Prescriptions  Medication Sig Dispense Refill  . ACTONEL 35 MG tablet       . amLODipine (NORVASC) 5 MG tablet Take 1 tablet (5 mg total) by mouth daily.  90 tablet  11  . atenolol (TENORMIN) 100 MG tablet Take 1 tablet (100 mg total) by mouth daily.  90 tablet  1  . Calcium Carbonate-Vitamin D (CALTRATE 600+D) 600-400 MG-UNIT per tablet Take 1 tablet by mouth daily.        [name] conjugated estrogens (PREMARIN) vaginal cream Place vaginally daily. 3 weeks on and 1 week off       . hydrochlorothiazide (HYDRODIURIL) 25 MG tablet Take 1 tablet (25 mg total) by mouth daily.  90 tablet  1  . HYDROcodone-acetaminophen (VICODIN) 5-500 MG per tablet Take by mouth Twice daily as needed.      [name] losartan (COZAAR) 25 MG tablet Take 1 tablet (25 mg total) by mouth daily.  90 tablet  3  . meloxicam (MOBIC) 7.5 MG tablet Take 1 tablet (7.5 mg total) by mouth daily as needed for pain.  30 tablet  2   Family History  Problem Relation Age of Onset  . Cancer Mother   . Stroke Father    History   Social  History  . Marital Status: Married    Spouse Name: N/A    Number of Children: N/A  . Years of Education: N/A   Social History Main Topics  . Smoking status: Former Games developer  . Smokeless tobacco: None  . Alcohol Use: No  . Drug Use: No  . Sexually Active: Yes   Other Topics Concern  . 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   Objective:  Physical Exam: Filed Vitals:   11/28/10 0844  BP: 170/90  Pulse: 65  Temp: 97 F (36.1 C)  TempSrc: Oral  Height: 5\' 4"  (1.626 m)  Weight: 226 lb 3.2 oz (102.604 kg)   General: elderly woman with asymmetric eyelid openings HEENT: PERRL, EOMI, no scleral icterus Cardiac: RRR, systolic murmur  Pulm: clear to auscultation bilaterally, moving normal volumes of air Psych: patient has normal mood and affect  Assessment & Plan:

## 2010-11-28 NOTE — Patient Instructions (Signed)
Please take 100mg  of the cozaar (can take FOUR pills of the 25mg  cozaar at the same time until you finish those pills then take ONE of the 100mg  pills from then on)

## 2010-12-12 ENCOUNTER — Encounter: Payer: Self-pay | Admitting: Internal Medicine

## 2010-12-12 ENCOUNTER — Other Ambulatory Visit (HOSPITAL_COMMUNITY)
Admission: RE | Admit: 2010-12-12 | Discharge: 2010-12-12 | Disposition: A | Discharge status: Home / Self Care | Payer: Medicare Other | Source: Ambulatory Visit | Attending: Internal Medicine | Admitting: Internal Medicine

## 2010-12-12 ENCOUNTER — Ambulatory Visit (INDEPENDENT_AMBULATORY_CARE_PROVIDER_SITE_OTHER): Payer: Medicare Other | Admitting: Internal Medicine

## 2010-12-12 VITALS — BP 129/76 | HR 59 | Temp 97.0°F | Ht 64.0 in | Wt 229.2 lb

## 2010-12-12 DIAGNOSIS — I10 Essential (primary) hypertension: Secondary | ICD-10-CM

## 2010-12-12 DIAGNOSIS — L918 Other hypertrophic disorders of the skin: Secondary | ICD-10-CM

## 2010-12-12 DIAGNOSIS — L909 Atrophic disorder of skin, unspecified: Secondary | ICD-10-CM | POA: Insufficient documentation

## 2010-12-12 NOTE — Progress Notes (Signed)
  Subjective:    Patient ID: [name], female    DOB: [birth year], 70 y.o.   MRN: [id number]  HPI  Inches 70 year old female with a past medical history listed below, presents to the outpatient clinic for followup of her blood pressure, after medication adjustments recently, today her blood pressure is within goal patient denies any complaints reports medication compliance. Patient reports that she has a skin tag on the right side in the posterior axillary line, it has been present for many years, has no recent changes, patient would like this to be removed given that it's causing her some irritation.   Patient Active Problem List  Diagnoses  . HYPERTENSION  . OSTEOARTHRITIS  . Osteoporosis, post-menopausal  . Routine adult health maintenance   Current Outpatient Prescriptions on File Prior to Visit  Medication Sig Dispense Refill  . ACTONEL 35 MG tablet       . amLODipine (NORVASC) 5 MG tablet Take 1 tablet (5 mg total) by mouth daily.  90 tablet  11  . aspirin 81 MG tablet Take 81 mg by mouth daily.        [name] atenolol (TENORMIN) 100 MG tablet Take 1 tablet (100 mg total) by mouth daily.  90 tablet  3  . Calcium Carbonate-Vitamin D (CALTRATE 600+D) 600-400 MG-UNIT per tablet Take 1 tablet by mouth daily.        [name] conjugated estrogens (PREMARIN) vaginal cream Place vaginally daily. 3 weeks on and 1 week off       . hydrochlorothiazide (HYDRODIURIL) 25 MG tablet Take 1 tablet (25 mg total) by mouth daily.  90 tablet  3  . HYDROcodone-acetaminophen (VICODIN) 5-500 MG per tablet Take by mouth Twice daily as needed.      [name] losartan (COZAAR) 100 MG tablet Take 1 tablet (100 mg total) by mouth daily.  90 tablet  3  . losartan (COZAAR) 25 MG tablet Take 1 tablet (25 mg total) by mouth daily.  90 tablet  3   Allergies  Allergen Reactions  . Ace Inhibitors Other (See Comments)    cough     Review of Systems  All other systems reviewed and are negative.       Objective:   Physical  Exam  Nursing note and vitals reviewed. Constitutional: She is oriented to person, place, and time. She appears well-developed and well-nourished.  HENT:  Head: Normocephalic and atraumatic.  Eyes: Pupils are equal, round, and reactive to light.  Neck: Normal range of motion. Neck supple. No JVD present. No thyromegaly present.  Cardiovascular: Normal rate, regular rhythm and normal heart sounds.   No murmur heard. Pulmonary/Chest: Effort normal and breath sounds normal. She has no wheezes. She has no rales.  Abdominal: Soft. Bowel sounds are normal.  Musculoskeletal: Normal range of motion. She exhibits no edema.  Neurological: She is alert and oriented to person, place, and time.  Skin: Skin is warm and dry.             Assessment & Plan:

## 2010-12-12 NOTE — Assessment & Plan Note (Signed)
Presents for many years, and unchanged, however is irritating the patient , is located in the right posterior axillary line with a size of about 1 cm . Excision was performed today, Procedure explained in detail with risk and benefits. consent obtained, time out done, site preped with betadine and alcohol. 25G needed used to inject lidocaine, 11 blade scalpel was used to excise this contact, bleeding was controlled with 3 simple interrupted sutures using a 4-0 absorbable monofilament suture. sucessful without complications. Patient to return in one week for suture removal. Noted specimens were sent to pathology for analysis

## 2010-12-12 NOTE — Assessment & Plan Note (Signed)
Well-controlled with recent medication change, no changes today, will check a basic metabolic panel.

## 2010-12-12 NOTE — Patient Instructions (Signed)
Please come back in 1 week for stiches removal

## 2010-12-13 NOTE — Progress Notes (Signed)
Addended by: Darnelle Maffucci on: 12/13/2010 11:44 AM   Modules accepted: Orders

## 2010-12-19 ENCOUNTER — Encounter: Payer: Self-pay | Admitting: Internal Medicine

## 2010-12-19 ENCOUNTER — Ambulatory Visit (INDEPENDENT_AMBULATORY_CARE_PROVIDER_SITE_OTHER): Payer: Medicare Other | Admitting: Internal Medicine

## 2010-12-19 VITALS — BP 124/83 | HR 62 | Temp 97.4°F | Ht 64.0 in | Wt 223.1 lb

## 2010-12-19 DIAGNOSIS — I10 Essential (primary) hypertension: Secondary | ICD-10-CM

## 2010-12-19 DIAGNOSIS — L918 Other hypertrophic disorders of the skin: Secondary | ICD-10-CM

## 2010-12-19 LAB — BASIC METABOLIC PANEL
CO2: 28 mEq/L (ref 19–32)
Calcium: 9.7 mg/dL (ref 8.4–10.5)
Creat: 0.85 mg/dL (ref 0.50–1.10)
Glucose, Bld: 115 mg/dL — ABNORMAL HIGH (ref 70–99)
Sodium: 140 mEq/L (ref 135–145)

## 2010-12-19 NOTE — Assessment & Plan Note (Signed)
Since removal patient had not had any complications, is here today for suture removal, 3 sutures were removed without any bleeding, surgical sites appears free of infection and healing well. Surgical pathology results pending from the skin tag.

## 2010-12-19 NOTE — Assessment & Plan Note (Addendum)
Excellent controlled, check basic metabolic panel today, given that it was not checked at last visit.

## 2010-12-19 NOTE — Patient Instructions (Signed)

## 2010-12-19 NOTE — Progress Notes (Signed)
Subjective:     Patient ID: [name], female   DOB: [birth year], 70 y.o.   MRN: [id number]  HPI  Patient is a 70 year old female with a past medical history listed below, presents to the outpatient clinic for followup of her blood pressure, and post skin tag removal on the right side in the posterior axillary line, it has been present for many years, with no recent changes, patient has this removed here last week given that it was causing her some irritation. Since removal patient denies any wound complications, or irritation, and is here for suture removal.    Patient Active Problem List  Diagnoses  . HYPERTENSION  . OSTEOARTHRITIS  . Osteoporosis, post-menopausal  . Routine adult health maintenance  . Skin tag     Current Outpatient Prescriptions on File Prior to Visit  Medication Sig Dispense Refill  . ACTONEL 35 MG tablet       . amLODipine (NORVASC) 5 MG tablet Take 1 tablet (5 mg total) by mouth daily.  90 tablet  11  . aspirin 81 MG tablet Take 81 mg by mouth daily.        [name] atenolol (TENORMIN) 100 MG tablet Take 1 tablet (100 mg total) by mouth daily.  90 tablet  3  . Calcium Carbonate-Vitamin D (CALTRATE 600+D) 600-400 MG-UNIT per tablet Take 1 tablet by mouth daily.        [name] conjugated estrogens (PREMARIN) vaginal cream Place vaginally daily. 3 weeks on and 1 week off       . hydrochlorothiazide (HYDRODIURIL) 25 MG tablet Take 1 tablet (25 mg total) by mouth daily.  90 tablet  3  . HYDROcodone-acetaminophen (VICODIN) 5-500 MG per tablet Take by mouth Twice daily as needed.      [name] losartan (COZAAR) 100 MG tablet Take 1 tablet (100 mg total) by mouth daily.  90 tablet  3  . losartan (COZAAR) 25 MG tablet Take 1 tablet (25 mg total) by mouth daily.  90 tablet  3   Allergies  Allergen Reactions  . Ace Inhibitors Other (See Comments)    cough     Review of Systems  All other systems reviewed and are negative.       Objective:   Physical Exam  Skin:

## 2011-03-03 ENCOUNTER — Other Ambulatory Visit: Payer: Self-pay | Admitting: Internal Medicine

## 2011-03-03 DIAGNOSIS — Z1231 Encounter for screening mammogram for malignant neoplasm of breast: Secondary | ICD-10-CM

## 2011-03-28 ENCOUNTER — Ambulatory Visit (HOSPITAL_COMMUNITY): Payer: Medicare Other | Attending: Internal Medicine

## 2011-05-08 ENCOUNTER — Encounter: Payer: Medicare Other | Admitting: Ophthalmology

## 2011-05-09 ENCOUNTER — Encounter: Payer: Medicare Other | Admitting: Ophthalmology

## 2011-05-10 ENCOUNTER — Encounter: Payer: Self-pay | Admitting: Ophthalmology

## 2011-05-10 ENCOUNTER — Ambulatory Visit (INDEPENDENT_AMBULATORY_CARE_PROVIDER_SITE_OTHER): Payer: Medicare Other | Admitting: Ophthalmology

## 2011-05-10 VITALS — BP 187/95 | HR 60 | Temp 97.2°F | Ht 64.0 in | Wt 228.4 lb

## 2011-05-10 DIAGNOSIS — I10 Essential (primary) hypertension: Secondary | ICD-10-CM

## 2011-05-10 DIAGNOSIS — M1990 Unspecified osteoarthritis, unspecified site: Secondary | ICD-10-CM

## 2011-05-10 DIAGNOSIS — N952 Postmenopausal atrophic vaginitis: Secondary | ICD-10-CM | POA: Insufficient documentation

## 2011-05-10 MED ORDER — ESTRADIOL 10 MCG VA TABS: 1.0000 | ORAL_TABLET | VAGINAL | Status: DC

## 2011-05-10 NOTE — Assessment & Plan Note (Signed)
Patient was taking headache medicine (aspirin/tylenol/caffeine) as well as Mobic and her pain was well controlled. She did not feel mobic worked well in November so I have asked her to stop this since it may contribute to her hypertension. Will continue headache medicine, I asked her to reduce dose to 1 pill twice a day instead of 2 pills since the aspirin dose is 500mg  for 2 pills and may cause stomach irritation.

## 2011-05-10 NOTE — Progress Notes (Signed)
Subjective:   Patient ID: [name] female   DOB: 12-May-1940 71 y.o.   MRN: [id number]  HPI: [name] is a 71 y.o. woman who is here for routine follow up for HTN and osteoarthritis.  HTN: patient's pressure is very elevated today. She doesn't seem to be taking amlodipine right now. It is crossed out on in her notebook. She says she stopped taking it when she filled her prescriptions in April.  Osteoarthritis: patient is currently taking 250 tylenol/ 250 aspirin/65 caffeine- 2 pills twice a day and meloxicam once a day. Meloxicam she has only been taking for the past 2 weeks. She thought it was replacing the amlodipine which she stopped.  Is taking vicodin maybe one pill every few days (prescribed by ortho)  Vaginal itching: using premarin cream every 2-3 months when she has a flare since it is expensive copay of $37.  Past Medical History  Diagnosis Date  . Hypertension   . OA (osteoarthritis)   . Breast mass 2005    mammogram 2006 negative for malignancy  . Systolic murmur     per patient report she has had rheumatic fever in childhood  . Osteoporosis, post-menopausal 2010   Current Outpatient Prescriptions  Medication Sig Dispense Refill  . ACTONEL 35 MG tablet       . amLODipine (NORVASC) 5 MG tablet Take 1 tablet (5 mg total) by mouth daily.  90 tablet  11  . aspirin 81 MG tablet Take 81 mg by mouth daily.        [name] atenolol (TENORMIN) 100 MG tablet Take 1 tablet (100 mg total) by mouth daily.  90 tablet  3  . Calcium Carbonate-Vitamin D (CALTRATE 600+D) 600-400 MG-UNIT per tablet Take 1 tablet by mouth daily.        [name] conjugated estrogens (PREMARIN) vaginal cream Place vaginally daily. 3 weeks on and 1 week off       . hydrochlorothiazide (HYDRODIURIL) 25 MG tablet Take 1 tablet (25 mg total) by mouth daily.  90 tablet  3  . HYDROcodone-acetaminophen (VICODIN) 5-500 MG per tablet Take by mouth Twice daily as needed.      [name] losartan (COZAAR) 100 MG tablet Take 1  tablet (100 mg total) by mouth daily.  90 tablet  3  . DISCONTD: losartan (COZAAR) 25 MG tablet Take 1 tablet (25 mg total) by mouth daily.  90 tablet  3   Family History  Problem Relation Age of Onset  . Cancer Mother   . Stroke Father    History   Social History  . Marital Status: Married    Spouse Name: N/A    Number of Children: N/A  . Years of Education: N/A   Social History Main Topics  . Smoking status: Former Games developer  . Smokeless tobacco: None  . Alcohol Use: No  . Drug Use: No  . Sexually Active: Yes   Other Topics Concern  . 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   Objective:  Physical Exam: Filed Vitals:   05/10/11 1547 05/10/11 1603  BP: 180/104 187/95  Pulse: 61 60  Temp: 97.2 F (36.2 C)   TempSrc: Oral   Height: 5\' 4"  (1.626 m)   Weight: 228 lb 6.4 oz (103.602 kg)   SpO2: 98%    General: pleasant elderly woman sitting  in chair with notebook HEENT: PERRL, EOMI, no scleral icterus, eyelid opening very asymmetric Neuro: alert and oriented X3, cranial nerves II-XII grossly intact  Assessment & Plan:

## 2011-05-10 NOTE — Patient Instructions (Addendum)
-  Please restart taking amlodipine -Please STOP meloxicam -you should be taking 4 blood pressure medicines- amlodipine, losartan, hydrochorothiazide, and atenolol -try to cut down to one pill of the headache pill instead of two if possible

## 2011-05-10 NOTE — Assessment & Plan Note (Signed)
Patient has been off amlodipine and back on meloxicam. I am not certain if she is taking other 3 BP agents since she did not bring her bottles today. Asked her to return in 1 week and bring medications and start back on amlodipine. I looked into combination BP pills at [facility] and we could consider switching to chlothalidone/atenolol to simplify her regimen once her BP is controlled.

## 2011-05-10 NOTE — Assessment & Plan Note (Signed)
Patient has been taking premarin cream with good response but the co-pay is high for her so I looked into other option and Dr. Meredith Pel recommended Vagifem which is an intravaginal pill which is about 1/4 the price. Will try this and see if her co-pay is better and if she responds well.

## 2011-05-17 ENCOUNTER — Ambulatory Visit (INDEPENDENT_AMBULATORY_CARE_PROVIDER_SITE_OTHER): Payer: Medicare Other | Admitting: Ophthalmology

## 2011-05-17 ENCOUNTER — Encounter: Payer: Self-pay | Admitting: Ophthalmology

## 2011-05-17 VITALS — BP 134/86 | HR 60 | Temp 97.6°F | Wt 226.8 lb

## 2011-05-17 DIAGNOSIS — M1990 Unspecified osteoarthritis, unspecified site: Secondary | ICD-10-CM

## 2011-05-17 DIAGNOSIS — I10 Essential (primary) hypertension: Secondary | ICD-10-CM

## 2011-05-17 DIAGNOSIS — N952 Postmenopausal atrophic vaginitis: Secondary | ICD-10-CM

## 2011-05-17 MED ORDER — LOSARTAN POTASSIUM 100 MG PO TABS: 100.0000 mg | ORAL_TABLET | Freq: Every day | ORAL | Status: DC

## 2011-05-17 MED ORDER — MELOXICAM 7.5 MG PO TABS: 7.5000 mg | ORAL_TABLET | Freq: Every day | ORAL | Status: DC

## 2011-05-17 MED ORDER — AMLODIPINE BESYLATE 10 MG PO TABS: 10.0000 mg | ORAL_TABLET | Freq: Every day | ORAL | Status: DC

## 2011-05-17 MED ORDER — ESTROGENS, CONJUGATED 0.625 MG/GM VA CREA: TOPICAL_CREAM | Freq: Every day | VAGINAL | Status: DC

## 2011-05-17 MED ORDER — ATENOLOL-CHLORTHALIDONE 100-25 MG PO TABS: 1.0000 | ORAL_TABLET | Freq: Every day | ORAL | Status: DC

## 2011-05-17 NOTE — Assessment & Plan Note (Addendum)
Patient is currently taking baby aspirin, meloxicam and aspirin in her OTC headache remedy. Asked her again to STOP meloxicam and stop baby aspirin as well and limit her use of the headache remedy to 3 pills a day= 750mg  of aspirin. She is not on a PPI, so will monitor for GERD & GI bleed.

## 2011-05-17 NOTE — Assessment & Plan Note (Signed)
Unfortunately, vagifem intravaginal pill was more expensive than premarin cream so will continue premarin cream.

## 2011-05-17 NOTE — Patient Instructions (Addendum)
-  Hydrochlorathiazide + atenolol will be together in one bottle= chlorthalidone + atenolol -continue losartan and amlodipine at the same doses (tell pharmacist that 10mg  of amlodipine is a mistake) -STOP the baby aspirin and STOP meloxicam -(don't pick up the refill for meloxicam) -limit the over the counter headache medicine to 3 pills a day

## 2011-05-17 NOTE — Assessment & Plan Note (Signed)
Patient's blood pressure is within reasonable control today after recheck was 134/86. I have combined her atenolol and HCTZ into chlorthalidone/atenolol pill and will ask her to continue losartan and amlodipine.

## 2011-05-17 NOTE — Progress Notes (Signed)
  Subjective:   Patient ID: [name] female   DOB: Mar 02, 1940 71 y.o.   MRN: [id number]  HPI: [name] is a 71 y.o. woman who presents for BP check.   Osteoarthritis: patient continued meloxicam once a day, though she was told to stop it and has been taking headache OTC medicine 1-2 pills two times a day. Is also taking vicodin very rarely (took one pill since last visit).  HTN: no dizziness  Past Medical History  Diagnosis Date  . Hypertension   . OA (osteoarthritis)   . Breast mass 2005    mammogram 2006 negative for malignancy  . Systolic murmur     per patient report she has had rheumatic fever in childhood  . Osteoporosis, post-menopausal 2010   Current Outpatient Prescriptions  Medication Sig Dispense Refill  . ACTONEL 35 MG tablet       . amLODipine (NORVASC) 5 MG tablet Take 1 tablet (5 mg total) by mouth daily.  90 tablet  11  . aspirin 81 MG tablet Take 81 mg by mouth daily.        [name] atenolol (TENORMIN) 100 MG tablet Take 1 tablet (100 mg total) by mouth daily.  90 tablet  3  . Calcium Carbonate-Vitamin D (CALTRATE 600+D) 600-400 MG-UNIT per tablet Take 1 tablet by mouth daily.        [name] conjugated estrogens (PREMARIN) vaginal cream Place vaginally daily. 3 weeks on and 1 week off       . Estradiol (VAGIFEM) 10 MCG TABS Place 1 tablet (10 mcg total) vaginally 2 (two) times a week.  8 tablet  4  . hydrochlorothiazide (HYDRODIURIL) 25 MG tablet Take 1 tablet (25 mg total) by mouth daily.  90 tablet  3  . HYDROcodone-acetaminophen (VICODIN) 5-500 MG per tablet Take by mouth Twice daily as needed.      [name] losartan (COZAAR) 100 MG tablet Take 1 tablet (100 mg total) by mouth daily.  90 tablet  3   Family History  Problem Relation Age of Onset  . Cancer Mother   . Stroke Father    History   Social History  . Marital Status: Married    Spouse Name: N/A    Number of Children: N/A  . Years of Education: N/A   Social History Main Topics  . Smoking status:  Former Games developer  . Smokeless tobacco: None  . Alcohol Use: No  . Drug Use: No  . Sexually Active: Yes   Other Topics Concern  . 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    Objective:  Physical Exam: Filed Vitals:   05/17/11 1542  BP: 148/97  Pulse: 84  Temp: 97.6 F (36.4 C)  TempSrc: Oral  Weight: 226 lb 12.8 oz (102.876 kg)   General: pleasant elderly woman sitting in chair in no distress HEENT: PERRL, EOMI, no scleral icterus Neuro: alert and oriented X3, cranial nerves II-XII grossly intact  Assessment & Plan:

## 2011-06-08 ENCOUNTER — Ambulatory Visit (HOSPITAL_COMMUNITY)
Admission: RE | Admit: 2011-06-08 | Discharge: 2011-06-08 | Disposition: A | Discharge status: Home / Self Care | Payer: Medicare Other | Source: Ambulatory Visit | Attending: Internal Medicine | Admitting: Internal Medicine

## 2011-06-08 DIAGNOSIS — Z1231 Encounter for screening mammogram for malignant neoplasm of breast: Secondary | ICD-10-CM | POA: Insufficient documentation

## 2012-01-18 ENCOUNTER — Telehealth: Payer: Self-pay

## 2012-01-18 NOTE — Telephone Encounter (Signed)
PT STATES SHE DR Kenyon Ana USUALLY GIVES HER A PILL THAT SHE TAKE ONCE A WEEK AND THE PHARMACY TOLD HER SHE HAD TO CALL us ABOUT IT PLEASE CALL [phone]    [facility] ON [address]

## 2012-01-18 NOTE — Telephone Encounter (Signed)
Spoke with patient to try and see what medication she was talking about. Didn't see any medication in med list in epic that was once a week. Pulled her paper chart to try and help her to see which one she was talking about. She said it was for her bones and then see she was on Actonel. Her last OV was 04/28/09. I explained she would have to come in and be seen since it has been so long. I gave her Dr. Loma Boston schedule and she said she would come in and see him.

## 2012-01-20 ENCOUNTER — Ambulatory Visit (INDEPENDENT_AMBULATORY_CARE_PROVIDER_SITE_OTHER): Payer: Medicare Other | Admitting: Family Medicine

## 2012-01-20 VITALS — BP 126/76 | HR 57 | Temp 97.6°F | Resp 16 | Ht 63.0 in | Wt 222.0 lb

## 2012-01-20 DIAGNOSIS — M810 Age-related osteoporosis without current pathological fracture: Secondary | ICD-10-CM

## 2012-01-20 DIAGNOSIS — M1990 Unspecified osteoarthritis, unspecified site: Secondary | ICD-10-CM

## 2012-01-20 DIAGNOSIS — M129 Arthropathy, unspecified: Secondary | ICD-10-CM

## 2012-01-20 MED ORDER — ALENDRONATE SODIUM 70 MG PO TABS: 70.0000 mg | ORAL_TABLET | ORAL | Status: DC

## 2012-01-20 MED ORDER — DICLOFENAC SODIUM 50 MG PO TBEC: 50.0000 mg | DELAYED_RELEASE_TABLET | Freq: Two times a day (BID) | ORAL | Status: DC

## 2012-01-20 NOTE — Progress Notes (Signed)
72 yo woman with osteoporosis and chronic knee pain under the care of an orthopod (Dr. Jerl Santos) who is injecting weekly.  Minimal improvement on knees so far.  Obj:  NAD Chest clear, mild kyphosis Heart:  Regular with no murmur  Assessment:  72 yo woman with h/o osteoporosis who has been on actonel for 2 years.  There is no reason to refill the medicine after this  Year  1. Osteoporosis, unspecified  alendronate (FOSAMAX) 70 MG tablet  2. Arthritis  diclofenac (VOLTAREN) 50 MG EC tablet

## 2012-02-21 ENCOUNTER — Telehealth: Payer: Self-pay | Admitting: *Deleted

## 2012-02-21 NOTE — Telephone Encounter (Signed)
Pharmacy needs refill on Hydrochlorothiazide 25mg  #90 one tablet by mouth every day. Last refill 11/13/11. Stanton Kidney Kjirsten Bloodgood RN 02/21/12 9:55AM

## 2012-02-27 NOTE — Telephone Encounter (Signed)
Thank You for the follow-up.  Will not renew the HCTZ as she was to stop this. Will wait until she is seen by Dr. Earlene Plater.  If continued HCTZ is appropriate at that time we can renew it at that visit.  Otherwise, we can clarify with her what medications she was supposed to be taking and which ones she was not supposed to be taking.

## 2012-02-27 NOTE — Telephone Encounter (Signed)
Pt called for a refill of hctz, i called pt and she has continued to take the hctz as well as all her other medicines, states she does not have her meds w/ her at the moment so cannot really tell me what she is taking. An appt is scheduled w/ dr Josem Kaufmann for April but also an appt mon 3/10 dr Earlene Plater to straighten her meds out, she will bring all her meds w/ her

## 2012-03-05 ENCOUNTER — Telehealth: Payer: Self-pay | Admitting: *Deleted

## 2012-03-05 NOTE — Telephone Encounter (Signed)
Phaqrmacy is requesting refill on HCTZ 25mg  #90 - last refill 11/13/11. [facility] Stanton Kidney Ditzler RN 03/05/12 2:45PM

## 2012-03-06 NOTE — Telephone Encounter (Signed)
Please see note from 02/21/2012 and call pharmacy to inform of decision.  If refill appropriate at 03/11/2012 visit with Dr. Earlene Plater we will refill the medication but there is considerable confusion of what she is to be taking and this has to be corrected.  Thank You.

## 2012-03-11 ENCOUNTER — Ambulatory Visit (INDEPENDENT_AMBULATORY_CARE_PROVIDER_SITE_OTHER): Payer: Medicare Other | Admitting: Internal Medicine

## 2012-03-11 ENCOUNTER — Encounter: Payer: Self-pay | Admitting: Internal Medicine

## 2012-03-11 VITALS — BP 142/77 | HR 66 | Temp 96.9°F | Ht 64.0 in | Wt 225.2 lb

## 2012-03-11 DIAGNOSIS — Z23 Encounter for immunization: Secondary | ICD-10-CM

## 2012-03-11 DIAGNOSIS — I10 Essential (primary) hypertension: Secondary | ICD-10-CM

## 2012-03-11 MED ORDER — LOSARTAN POTASSIUM 100 MG PO TABS: 100.0000 mg | ORAL_TABLET | Freq: Every day | ORAL | Status: DC

## 2012-03-11 NOTE — Progress Notes (Signed)
  Subjective:    Patient ID: [name], female    DOB: [birth year], 72 y.o.   MRN: [id number]  HPI:  This is a 72 year old woman with osteoarthritis and hypertension. She comes to the clinic today for routine followup and with questions about her medicines. The pharmacy recently contacted the clinic for a refill of hydrochlorothiazide. This medicine did not appear on our records. The patient wants to know which blood pressure medicines she should be taking. She is otherwise without any complaints today. Her review of systems was negative. I reviewed with her her medicines and how she organizes and takes them. It seems she combines off her medicines into a 10 can to take to work. In many of her medicine bottles, she has mixed over-the-counter medicines, vitamins, and prescription medicines. She did, however, have a good understanding of what her medicines were for and how often she takes them.   Review of Systems  Respiratory: Negative for shortness of breath.   Cardiovascular: Negative for chest pain.  Gastrointestinal: Negative for nausea, vomiting, abdominal pain, diarrhea, constipation and blood in stool.  Genitourinary: Negative for dysuria and hematuria.  Neurological: Positive for headaches. Negative for dizziness.    Current Medications: 1. Alendronate 70 mg weekly 2. Amlodipine 10 mg daily 3. Aspirin 81 mg daily 4. OTC headache medicine as needed 5. Atenolol-chlorthalidone 100-25 mg daily 6. Calcium-vitamin D 600-400 mg-unit daily 7. Premarin vaginal cream daily 8. Diclofenac 50 mg twice a day 9. Hydrocodone-acetaminophen 5-500 mg twice a day as needed 10. Losartan 100 mg daily 11. Tramadol 50 mg every 8 hours as needed      Objective:   Physical Exam: GENERAL: well developed, well nourished; no acute distress HEAD: atraumatic, normocephalic EYES: pupils equal, round and reactive; sclera anicteric; normal conjunctiva EARS: canals patent and TMs normal bilaterally,  cholesteatoma visualized in the right middle ear NOSE/THROAT: oropharynx clear, moist mucous membranes, pink gingiva, dentures in place NECK: supple, no carotid bruits, thyroid normal in size and without palpable nodules LYMPH: no cervical or supraclavicular lymphadenopathy LUNGS: clear to auscultation bilaterally, normal work of breathing HEART: normal rate and regular rhythm; normal S1 and S2 without S3 or S4; 2/6 systolic murmur heard best at the upper sternal borders PULSES: radial 2+ and symmetric ABDOMEN: soft, non-tender, normal bowel sounds SKIN: warm, dry, intact, normal turgor, no rashes EXTREMITIES: no peripheral edema, clubbing, or cyanosis PSYCH: patient is alert and oriented, mood and affect are normal and congruent  Filed Vitals:   03/11/12 0821  BP: 142/77  Pulse: 66  Temp: 96.9 F (36.1 C)    BP Readings from Last 3 Encounters:  03/11/12 142/77  01/20/12 126/76  05/17/11 134/86          Assessment & Plan:   Polysaccharide pneumococcal vaccination and Tdap vaccination administered.

## 2012-03-11 NOTE — Assessment & Plan Note (Addendum)
BP Readings from Last 3 Encounters:  03/11/12 142/77  01/20/12 126/76  05/17/11 134/86    Lab Results  Component Value Date   NA 140 12/19/2010   K 4.2 12/19/2010   CREATININE 0.85 12/19/2010    Assessment:  Blood pressure control: mildly elevated  Progress toward BP goal:  deteriorated  Plan:  Medications:  Atenolol-chlorthalidone 100-25 mg daily and losartan 100 mg daily  Educational resources provided: brochure  Self management tools provided: home blood pressure logbook  Other plans: Educated patient on low-salt diet and provided instruction on proper medication organization   ADDENDUM:  BMET Component Value Date/Time   NA 139 03/11/2012 0916   K 3.6 03/11/2012 0916   CL 106 03/11/2012 0916   CO2 25 03/11/2012 0916   GLUCOSE 101* 03/11/2012 0916   BUN 17 03/11/2012 0916   CREATININE 0.70 03/11/2012 0916   CALCIUM 9.9 03/11/2012 0916

## 2012-03-11 NOTE — Patient Instructions (Addendum)
General Instructions:  Separate out your daily blood pressure medicines into a dedicated medication box divided by day and time of day as we discussed.  Keep your pain medicine separate and in their original bottles. Only take pain medicine when you are in pain.  Reduce the amount of salt in your diet.  Remember not to take more than 3,000mg  of acetaminophen in 24 hours.  Headache medicines, Tylenol, Vicodin, and many other medications have acetaminophen in them; so make sure that the total dose from all sources does not add up to more than 3,000mg .  Daily medicines: 1. AMLODIPINE 10 mg daily 2. ATENOLOL-CHLORTHALIDONE 100-25 mg daily 3. LOSARTAN 100 mg daily 4. ASPIRIN 81 mg daily 5. CALCIUM-VITAMIN D supplement daily  Pain medicines to be taken only as needed: 1. Over-the-counter headache medicine every 6 hours as needed 2. HYDROCODONE-ACETAMINOPHEN 5-500 mg twice a day as needed 3. TRAMADOL 50 mg every 8 hours as needed 4. DICLOFENAC 50 mg twice a day as needed   Treatment Goals:  Goals (1 Years of Data) as of 03/11/12         As of Today 01/20/12 05/17/11 05/17/11 05/10/11     Blood Pressure    . Blood Pressure < 140/90  142/77 126/76 134/86 148/97 187/95      Progress Toward Treatment Goals:  Treatment Goal 03/11/2012  Blood pressure deteriorated    Self Care Goals & Plans:  Self Care Goal 03/11/2012  Manage my medications take my medicines as prescribed; bring my medications to every visit; refill my medications on time  Eat healthy foods drink diet soda or water instead of juice or soda; eat more vegetables; eat foods that are low in salt       Care Management & Community Referrals:  Referral 03/11/2012  Referrals made for care management support none needed

## 2012-03-12 LAB — BASIC METABOLIC PANEL
Chloride: 106 mEq/L (ref 96–112)
Potassium: 3.6 mEq/L (ref 3.5–5.3)

## 2012-03-15 NOTE — Telephone Encounter (Signed)
Pt was seen 03/11/12 Dr Earlene Plater about meds.

## 2012-04-26 ENCOUNTER — Ambulatory Visit (INDEPENDENT_AMBULATORY_CARE_PROVIDER_SITE_OTHER): Payer: Medicare Other | Admitting: Internal Medicine

## 2012-04-26 ENCOUNTER — Encounter: Payer: Self-pay | Admitting: Internal Medicine

## 2012-04-26 VITALS — BP 120/79 | HR 73 | Temp 97.2°F | Wt 221.1 lb

## 2012-04-26 DIAGNOSIS — E669 Obesity, unspecified: Secondary | ICD-10-CM | POA: Insufficient documentation

## 2012-04-26 DIAGNOSIS — I10 Essential (primary) hypertension: Secondary | ICD-10-CM

## 2012-04-26 DIAGNOSIS — M129 Arthropathy, unspecified: Secondary | ICD-10-CM

## 2012-04-26 DIAGNOSIS — Z0000 Encounter for general adult medical examination without abnormal findings: Secondary | ICD-10-CM

## 2012-04-26 DIAGNOSIS — E66811 Obesity, class 1: Secondary | ICD-10-CM | POA: Insufficient documentation

## 2012-04-26 DIAGNOSIS — M1990 Unspecified osteoarthritis, unspecified site: Secondary | ICD-10-CM

## 2012-04-26 DIAGNOSIS — M810 Age-related osteoporosis without current pathological fracture: Secondary | ICD-10-CM

## 2012-04-26 DIAGNOSIS — N952 Postmenopausal atrophic vaginitis: Secondary | ICD-10-CM

## 2012-04-26 LAB — LIPID PANEL
HDL: 40 mg/dL (ref >39)
LDL Cholesterol: 106 mg/dL — ABNORMAL HIGH (ref 0–99)
Total CHOL/HDL Ratio: 4.1 Ratio
Triglycerides: 89 mg/dL (ref <150)
VLDL: 18 mg/dL (ref 0–40)

## 2012-04-26 MED ORDER — ESTROGENS, CONJUGATED 0.625 MG/GM VA CREA: TOPICAL_CREAM | Freq: Every day | VAGINAL | Status: DC

## 2012-04-26 MED ORDER — LOSARTAN POTASSIUM 100 MG PO TABS: 100.0000 mg | ORAL_TABLET | Freq: Every day | ORAL | Status: DC

## 2012-04-26 MED ORDER — DICLOFENAC SODIUM 50 MG PO TBEC: 50.0000 mg | DELAYED_RELEASE_TABLET | Freq: Two times a day (BID) | ORAL | Status: DC

## 2012-04-26 MED ORDER — ATENOLOL-CHLORTHALIDONE 100-25 MG PO TABS: 1.0000 | ORAL_TABLET | Freq: Every day | ORAL | Status: DC

## 2012-04-26 MED ORDER — CALCIUM CARBONATE-VITAMIN D 600-400 MG-UNIT PO TABS: 1.0000 | ORAL_TABLET | Freq: Every day | ORAL | Status: DC

## 2012-04-26 MED ORDER — HYDROCODONE-ACETAMINOPHEN 5-500 MG PO TABS: 1.0000 | ORAL_TABLET | Freq: Three times a day (TID) | ORAL | Status: DC | PRN

## 2012-04-26 MED ORDER — ALENDRONATE SODIUM 70 MG PO TABS: 70.0000 mg | ORAL_TABLET | ORAL | Status: DC

## 2012-04-26 MED ORDER — AMLODIPINE BESYLATE 10 MG PO TABS: 10.0000 mg | ORAL_TABLET | Freq: Every day | ORAL | Status: DC

## 2012-04-26 NOTE — Assessment & Plan Note (Signed)
She continues taking the Fosamax 70 mg by mouth weekly and calcium vitamin D daily. She is tolerating these medications well. His been 3-1/2 years since her DEXA scan that resulted in the diagnosis of osteoporosis. At the followup visit we will discuss repeating the DEXA scan to assure that there is no further progression of her osteoporosis on the current therapy which we will continue.

## 2012-04-26 NOTE — Assessment & Plan Note (Signed)
Her blood pressure is well within the target range on the amlodipine 10 mg by mouth daily, atenolol-chlorthalidone 100-25 mg by mouth daily, and losartan 100 mg by mouth daily. She is tolerating these medications well and we will continue the current regimen.  BP Readings from Last 3 Encounters:  04/26/12 120/79  03/11/12 142/77  01/20/12 126/76    Lab Results  Component Value Date   NA 139 03/11/2012   K 3.6 03/11/2012   CREATININE 0.70 03/11/2012    Assessment: Blood pressure control: controlled Progress toward BP goal:  at goal  Plan: Medications:  continue current medications Educational resources provided: brochure;video Self management tools provided: home blood pressure logbook

## 2012-04-26 NOTE — Assessment & Plan Note (Signed)
She is without complaints while using the estrogen vaginal cream. She asked that we continue with this therapy and this was represcribed.

## 2012-04-26 NOTE — Progress Notes (Signed)
  Subjective:    Patient ID: [name], female    DOB: 04-20-1940, 72 y.o.   MRN: [id number]  HPI  Please see the A&P for the status of the pt's chronic medical problems.  Review of Systems  Constitutional: Negative for activity change, appetite change, fatigue and unexpected weight change.  Respiratory: Negative for cough, shortness of breath and wheezing.   Cardiovascular: Negative for chest pain and leg swelling.  Gastrointestinal: Negative for abdominal pain and abdominal distention.  Musculoskeletal: Positive for arthralgias. Negative for myalgias, back pain, joint swelling and gait problem.  Skin: Negative for color change, pallor, rash and wound.  Allergic/Immunologic: Negative for environmental allergies.  Neurological: Negative for seizures, syncope, weakness, light-headedness and headaches.  Psychiatric/Behavioral: Negative for dysphoric mood. The patient is not nervous/anxious.       Objective:   Physical Exam  Nursing note and vitals reviewed. Constitutional: She is oriented to person, place, and time. She appears well-developed and well-nourished. No distress.  HENT:  Head: Normocephalic and atraumatic.  Eyes: Conjunctivae are normal. Right eye exhibits no discharge. Left eye exhibits no discharge. No scleral icterus.  Neck: Normal range of motion.  Cardiovascular: Normal rate and regular rhythm.  Exam reveals no gallop and no friction rub.   Murmur heard. Pulmonary/Chest: Effort normal and breath sounds normal. No respiratory distress. She has no wheezes. She has no rales.  Abdominal: Soft. Bowel sounds are normal. She exhibits no distension. There is no tenderness. There is no rebound and no guarding.  Musculoskeletal: Normal range of motion. She exhibits no edema and no tenderness.  Neurological: She is alert and oriented to person, place, and time.  Skin: Skin is warm and dry. No rash noted. She is not diaphoretic. No erythema. No pallor.  Psychiatric: She has a  normal mood and affect. Her behavior is normal. Judgment and thought content normal.      Assessment & Plan:   Please see Problem Oriented Charting.

## 2012-04-26 NOTE — Assessment & Plan Note (Signed)
We obtained a lipid profile at this visit and the LDL was 106 which is well within target.

## 2012-04-26 NOTE — Assessment & Plan Note (Signed)
She has long-standing arthritis of both knees. She's been treated with what sounds like Synvisc injections as well as steroid injections. These have helped with her symptoms. She also notes bilateral arthralgias in the hands which developed more recently. Her arthritis has been well controlled with the diclofenac 50 mg by mouth twice daily and Vicodin 5-500 one tablet by mouth every 12 hours as needed for pain. In an attempt to ease her access to care I will assume the responsibility of prescribing the Vicodin. She signed a pain contract today for 60 Vicodin per month. We will also continue the diclofenac and discontinue the tramadol which was ineffective.

## 2012-04-26 NOTE — Patient Instructions (Signed)
It was nice to meet you.  I look forward to caring for you for years to come.  1) Keep taking all of your medications as you are.  2) Please stop taking the tramadol.  3) I will call you if the cholesterol labs are concerning.  4) I will see you back in 6 months to check up on your blood pressure.

## 2012-11-11 ENCOUNTER — Other Ambulatory Visit: Payer: Self-pay | Admitting: Internal Medicine

## 2012-11-11 DIAGNOSIS — Z1231 Encounter for screening mammogram for malignant neoplasm of breast: Secondary | ICD-10-CM

## 2012-12-03 ENCOUNTER — Ambulatory Visit (HOSPITAL_COMMUNITY)
Admission: RE | Admit: 2012-12-03 | Discharge: 2012-12-03 | Disposition: A | Discharge status: Home / Self Care | Payer: Medicare Other | Source: Ambulatory Visit | Attending: Internal Medicine | Admitting: Internal Medicine

## 2012-12-03 DIAGNOSIS — Z1231 Encounter for screening mammogram for malignant neoplasm of breast: Secondary | ICD-10-CM | POA: Insufficient documentation

## 2012-12-21 ENCOUNTER — Other Ambulatory Visit: Payer: Self-pay | Admitting: Family Medicine

## 2013-04-29 ENCOUNTER — Other Ambulatory Visit: Payer: Self-pay | Admitting: Internal Medicine

## 2013-05-23 ENCOUNTER — Ambulatory Visit (INDEPENDENT_AMBULATORY_CARE_PROVIDER_SITE_OTHER): Payer: Medicare Other | Admitting: Internal Medicine

## 2013-05-23 ENCOUNTER — Encounter: Payer: Self-pay | Admitting: Internal Medicine

## 2013-05-23 VITALS — BP 131/83 | HR 60 | Temp 96.4°F | Wt 219.0 lb

## 2013-05-23 DIAGNOSIS — M810 Age-related osteoporosis without current pathological fracture: Secondary | ICD-10-CM

## 2013-05-23 DIAGNOSIS — I10 Essential (primary) hypertension: Secondary | ICD-10-CM

## 2013-05-23 DIAGNOSIS — M1710 Unilateral primary osteoarthritis, unspecified knee: Secondary | ICD-10-CM

## 2013-05-23 DIAGNOSIS — N952 Postmenopausal atrophic vaginitis: Secondary | ICD-10-CM

## 2013-05-23 DIAGNOSIS — Z0000 Encounter for general adult medical examination without abnormal findings: Secondary | ICD-10-CM

## 2013-05-23 DIAGNOSIS — E669 Obesity, unspecified: Secondary | ICD-10-CM

## 2013-05-23 DIAGNOSIS — M1990 Unspecified osteoarthritis, unspecified site: Secondary | ICD-10-CM

## 2013-05-23 DIAGNOSIS — IMO0002 Reserved for concepts with insufficient information to code with codable children: Secondary | ICD-10-CM

## 2013-05-23 LAB — BASIC METABOLIC PANEL WITH GFR
BUN: 15 mg/dL (ref 6–23)
CALCIUM: 9.8 mg/dL (ref 8.4–10.5)
CO2: 27 mEq/L (ref 19–32)
Chloride: 102 mEq/L (ref 96–112)
Creat: 0.77 mg/dL (ref 0.50–1.10)
GFR, EST AFRICAN AMERICAN: 89 mL/min
GFR, Est Non African American: 77 mL/min
Glucose, Bld: 90 mg/dL (ref 70–99)
Potassium: 4 mEq/L (ref 3.5–5.3)
SODIUM: 139 meq/L (ref 135–145)

## 2013-05-23 MED ORDER — DICLOFENAC SODIUM 50 MG PO TBEC: 50.0000 mg | DELAYED_RELEASE_TABLET | Freq: Two times a day (BID) | ORAL | Status: DC

## 2013-05-23 MED ORDER — ATENOLOL-CHLORTHALIDONE 100-25 MG PO TABS: 1.0000 | ORAL_TABLET | Freq: Every day | ORAL | Status: DC

## 2013-05-23 MED ORDER — LOSARTAN POTASSIUM 100 MG PO TABS: 100.0000 mg | ORAL_TABLET | Freq: Every day | ORAL | Status: DC

## 2013-05-23 MED ORDER — AMLODIPINE BESYLATE 10 MG PO TABS: 10.0000 mg | ORAL_TABLET | Freq: Every day | ORAL | Status: DC

## 2013-05-23 NOTE — Assessment & Plan Note (Addendum)
Her blood pressure today was at target at 131/83. She only brought in her Tenoretic and losartan. That being said, she states she's on 3 blood pressure medications and has been taking her amlodipine. We will therefore continue the amlodipine at 10 mg by mouth daily, Tenoretic 100-25 mg by mouth daily, and losartan 100 mg by mouth daily. We will reassess her blood pressure control at the followup visit. A basic metabolic panel was drawn at today's visit and is pending at the time of this dictation.

## 2013-05-23 NOTE — Assessment & Plan Note (Signed)
Zostavax was deferred at this time secondary to its expense. We will rediscuss the issue at the followup visit.

## 2013-05-23 NOTE — Assessment & Plan Note (Signed)
She has lost 6 pounds in the last year and she was encouraged to continue her efforts at losing weight. This will be very important in order to maintain control of her osteoarthritic pain.

## 2013-05-23 NOTE — Assessment & Plan Note (Signed)
She no longer can afford the estrogen vaginal cream. She does not have symptoms of itching at this point. When she does, she uses over-the-counter Monistat which resolves her problems. Examination today revealed no evidence of a yeast infection. The estrogen vaginal cream was therefore discontinued and as needed over-the-counter Monistat will be used for any itching.

## 2013-05-23 NOTE — Patient Instructions (Signed)
It was good to see you again.  You are doing a good job taking care of yourself.  1) Keep taking all of your medications as you have been doing.  2) Please check to make sure you are taking your amlodipine 10 mg daily for your blood pressure.  3) I restarted the diclofrenac for your knee pain which you can take with your other knee pain medication (acetaminophen-Asprin).  I sent this presecription to [facility].  4) Take TUMS twice a day to get your calcium for the day.  5) Call when you have time for the bone scan we talked about to check the strength of your bones.  I will see you back in 6 months, sooner if necessary.

## 2013-05-23 NOTE — Assessment & Plan Note (Signed)
She has not been taking her calcium, vitamin D, and Fosamax. She is due for a DEXA scan and I made this proposal. Unfortunately, her work schedule does not allow this to be done easily. She will therefore call the clinic when she may have time to have a DEXA scan scheduled. In the meantime, she was encouraged to at least take TUMS which she has at home to get some calcium. I'm hopeful that the DEXA scan results will encourage her to maintain compliance with her calcium, vitamin D, and bisphosphonate therapy.

## 2013-05-23 NOTE — Assessment & Plan Note (Signed)
She states she continues to have some pain in her knees. This has responded well to over-the-counter acetaminophen-aspirin combination. She has not required any of her narcotic and this was discontinued. She would like the diclofenac back, which she feels helped. This was therefore rewritten and sent to the pharmacy at [facility] per her request.

## 2013-05-23 NOTE — Progress Notes (Signed)
   Subjective:    Patient ID: [name], female    DOB: [birth year], 73 y.o.   MRN: [id number]  HPI  Please see the A&P for the status of the pt's chronic medical problems.  Review of Systems  Constitutional: Negative for fever, activity change, appetite change, fatigue and unexpected weight change.  HENT:       Ear itchiness bilaterally  Respiratory: Negative for chest tightness and shortness of breath.   Cardiovascular: Negative for chest pain and leg swelling.  Gastrointestinal: Negative for abdominal pain.  Genitourinary:       Occasional vaginal itching responsive to OTC Monostat  Musculoskeletal: Negative for arthralgias, back pain, joint swelling, myalgias and neck pain.  Skin: Negative for rash.  Neurological: Negative for syncope, weakness and light-headedness.  Psychiatric/Behavioral: The patient is not nervous/anxious.       Objective:   Physical Exam  Nursing note and vitals reviewed. Constitutional: She is oriented to person, place, and time. She appears well-developed and well-nourished. No distress.  HENT:  Head: Normocephalic and atraumatic.  Right Ear: External ear normal.  Left Ear: External ear normal.  Eyes: Conjunctivae are normal. Right eye exhibits no discharge. Left eye exhibits no discharge. No scleral icterus.  Neck: Neck supple.  Cardiovascular: Normal rate and regular rhythm.  Exam reveals no gallop and no friction rub.   Murmur heard. Crescendo murmur at RUSB  Pulmonary/Chest: Effort normal and breath sounds normal. No respiratory distress. She has no wheezes. She has no rales.  Abdominal: Soft. Bowel sounds are normal. She exhibits no distension. There is no tenderness. There is no rebound and no guarding.  Genitourinary: Vagina normal. No vaginal discharge found.  Musculoskeletal: Normal range of motion. She exhibits no edema and no tenderness.  Neurological: She is alert and oriented to person, place, and time. She exhibits normal muscle  tone.  Skin: Skin is warm and dry. No rash noted. She is not diaphoretic. No erythema.  Psychiatric: She has a normal mood and affect. Her behavior is normal. Judgment and thought content normal.      Assessment & Plan:   Please see problem oriented charting.

## 2013-06-03 NOTE — Progress Notes (Signed)
BMP: Within normal values.  We will continue with current therapy as outlined above.

## 2013-07-29 ENCOUNTER — Other Ambulatory Visit: Payer: Self-pay | Admitting: Internal Medicine

## 2013-07-29 DIAGNOSIS — I10 Essential (primary) hypertension: Secondary | ICD-10-CM

## 2013-10-27 ENCOUNTER — Ambulatory Visit (INDEPENDENT_AMBULATORY_CARE_PROVIDER_SITE_OTHER): Payer: Medicare Other | Admitting: Family Medicine

## 2013-10-27 ENCOUNTER — Ambulatory Visit (INDEPENDENT_AMBULATORY_CARE_PROVIDER_SITE_OTHER): Payer: Medicare Other

## 2013-10-27 ENCOUNTER — Encounter: Payer: Self-pay | Admitting: Family Medicine

## 2013-10-27 VITALS — BP 134/78 | HR 67 | Temp 98.5°F | Resp 16 | Ht 63.0 in | Wt 205.2 lb

## 2013-10-27 DIAGNOSIS — M25552 Pain in left hip: Secondary | ICD-10-CM

## 2013-10-27 DIAGNOSIS — R109 Unspecified abdominal pain: Secondary | ICD-10-CM

## 2013-10-27 LAB — POCT UA - MICROSCOPIC ONLY
Casts, Ur, LPF, POC: NEGATIVE
Crystals, Ur, HPF, POC: NEGATIVE
Mucus, UA: POSITIVE
Yeast, UA: NEGATIVE

## 2013-10-27 LAB — POCT URINALYSIS DIPSTICK
Bilirubin, UA: NEGATIVE
Blood, UA: NEGATIVE
Glucose, UA: NEGATIVE
Ketones, UA: NEGATIVE
Leukocytes, UA: NEGATIVE
Nitrite, UA: NEGATIVE
Spec Grav, UA: 1.02
Urobilinogen, UA: 0.2
pH, UA: 6

## 2013-10-27 MED ORDER — PREDNISONE 20 MG PO TABS: 40.0000 mg | ORAL_TABLET | Freq: Every day | ORAL | Status: DC

## 2013-10-27 NOTE — Patient Instructions (Signed)
I believe the pain is coming from some strained muscles in addition to some mild arthritis.  Please let me know by Wednesday if the medicine I have prescribed is not working.

## 2013-10-27 NOTE — Progress Notes (Addendum)
Patient ID: [name], female   DOB: 03-17-1940, 73 y.o.   MRN: [id number]  This chart was scribed for Abigail ChickKristi [name] Smith, MD by Charline BillsEssence Howell, ED Scribe. The patient was seen in room 24. Patient's care was started at 8:13 AM.  Patient ID: [name] MRN: [id number], DOB: 03-17-1940, 73 y.o. Date of Encounter: 10/27/2013, 8:13 AM  Primary Physician: Rocco SereneKLIMA, LAWRENCE D, MD  Chief Complaint  Patient presents with   Back Pain    lower left back pain  l lower back pain HPI: 73 y.o. year old female with history below presents with L flank pain onset 1 month ago, worsened 2 days ago. Pt states "it feels like a trapped bubble". Pain is exacerbated with lifting her leg, turning and standing up. She denies injury. Pt suspects that pain could be attributed to adding another mattress on her bed. She also denies SOB, any other joint pain or any other complaints at this time.   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  She has to keep working because her husband had  Stroke and she needs the money.  She is seeing an orthopedist for her knees, but these are not bothering her at the moment.  She hopes to retire in May  Past Medical History  Diagnosis Date   Essential hypertension    Osteoarthritis     knees and hands   Systolic murmur     per patient report she has had rheumatic fever in childhood   Osteoporosis, post-menopausal 2010    left femur T -2.7, left forearm T -3.5   Blood transfusion without reported diagnosis     1960's, unknown reason   Post-menopausal atrophic vaginitis    Obesity, Class II, BMI 35-39.9      Home Meds: Prior to Admission medications   Medication Sig Start Date End Date Taking? Authorizing Provider  alendronate (FOSAMAX) 70 MG tablet Take 1 tablet (70 mg total) by mouth every 7 (seven) days. Take with a full glass of water on an empty stomach. 04/26/12  Yes Rocco SereneLawrence D Klima, MD  amLODipine  (NORVASC) 10 MG tablet Take 1 tablet (10 mg total) by mouth daily. 07/29/13  Yes Rocco SereneLawrence D Klima, MD  aspirin 81 MG tablet Take 81 mg by mouth daily.     Yes Historical Provider, MD  atenolol-chlorthalidone (TENORETIC) 100-25 MG per tablet Take 1 tablet by mouth daily. 07/29/13  Yes Rocco SereneLawrence D Klima, MD  Calcium Carbonate-Vitamin D (CALTRATE 600+D) 600-400 MG-UNIT per tablet Take 1 tablet by mouth daily. 04/26/12  Yes Rocco SereneLawrence D Klima, MD  diclofenac (VOLTAREN) 50 MG EC tablet Take 1 tablet (50 mg total) by mouth 2 (two) times daily. 05/23/13  Yes Rocco SereneLawrence D Klima, MD  losartan (COZAAR) 100 MG tablet Take 1 tablet (100 mg total) by mouth daily. 07/29/13  Yes Rocco SereneLawrence D Klima, MD    Allergies:  Allergies  Allergen Reactions   Ace Inhibitors Other (See Comments)    cough    History   Social History   Marital Status: Married    Spouse Name: N/A    Number of Children: N/A   Years of Education: N/A   Occupational History   Not on file.   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     Review of Systems: Constitutional: negative for chills, fever, night sweats, weight changes, or fatigue  HEENT: negative for vision changes, hearing loss, congestion, rhinorrhea, ST, epistaxis, or sinus pressure Cardiovascular: negative for chest pain or palpitations Respiratory: negative for hemoptysis, wheezing, shortness of breath, or cough Abdominal: negative for abdominal pain, nausea, vomiting, diarrhea, or constipation GU: +flank pain Dermatological: negative for rash Neurologic: negative for headache, dizziness, or syncope All other systems reviewed and are otherwise negative  with the exception to those above and in the HPI.  Physical Exam: Triage Vitals: Blood pressure 134/78, pulse 67, temperature 98.5 F (36.9 C), temperature source Oral, resp. rate 16, height 5\' 3"  (1.6 [name]), weight 205 lb 3.2 oz (93.078 kg), SpO2 100.00%., Body mass index is 36.36 kg/([name]^2). General: Well developed, well nourished, in no acute distress. Head: Normocephalic, atraumatic, eyes without discharge, sclera non-icteric, nares are without discharge. Bilateral auditory canals clear, TM's are without perforation, pearly grey and translucent with reflective cone of light bilaterally. Oral cavity moist, posterior pharynx without exudate, erythema, peritonsillar abscess, or post nasal. No teeth. Neck: Supple. No thyromegaly. Full ROM. No lymphadenopathy.  Ptosis left eye. Lungs: Clear bilaterally to auscultation without wheezes, rales, or rhonchi. Breathing is unlabored. Heart: RRR with S1 S2. No murmurs, rubs, or gallops appreciated. Abdomen: Soft, non-tender, non-distended with normoactive bowel sounds. No hepatomegaly. No rebound/guarding. No obvious abdominal masses. Msk:  Strength and tone normal for age. Decreased ROM in L hip. Unable to internally rotate L hip. Flat feet. Synovial thickening on knees with good ROM and good pulses.  Slow gait.  Nontender trochanter.  Normal SLR Extremities/Skin: Warm and dry. No clubbing or cyanosis. No edema. No rashes or suspicious lesions. Neuro: Alert and oriented X 3. Moves all extremities spontaneously. Gait is normal. CNII-XII grossly in tact. Psych:  Responds to questions appropriately with a normal affect.   Labs:   Results for orders placed in visit on 05/23/13  BASIC METABOLIC PANEL WITH GFR      Result Value Ref Range   Sodium 139  135 - 145 mEq/L   Potassium 4.0  3.5 - 5.3 mEq/L   Chloride 102  96 - 112 mEq/L   CO2 27  19 - 32 mEq/L   Glucose, Bld 90  70 - 99 mg/dL   BUN 15  6 - 23 mg/dL   Creat 4.090.77  8.110.50 - 9.141.10 mg/dL   Calcium 9.8  8.4  - 78.210.5 mg/dL   GFR, Est African American 89     GFR, Est Non African American 77     U/A:  Normal dip [facility] reading (PRIMARY) by  Dr. Milus GlazierLauenstein  Left hip: no severe arthritis changes although the trochanters are slightly irregular.  The L5 S1 joint is sclerotic    ASSESSMENT AND PLAN:  73 y.o. 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 The  recorded information has been reviewed and is accurate.  Signed, Elvina Sidle, MD 10/27/2013 8:13 AM

## 2013-10-31 ENCOUNTER — Telehealth: Payer: Self-pay

## 2013-10-31 DIAGNOSIS — M25552 Pain in left hip: Secondary | ICD-10-CM

## 2013-10-31 DIAGNOSIS — R109 Unspecified abdominal pain: Secondary | ICD-10-CM

## 2013-10-31 MED ORDER — PREDNISONE 20 MG PO TABS: 40.0000 mg | ORAL_TABLET | Freq: Every day | ORAL | Status: DC

## 2013-10-31 NOTE — Telephone Encounter (Signed)
Pt states that the pain has gotten better but she is still having pain. She is asking for what the next step should be. She wanted additional prednisone. Pt is going out of town this weekend but would like something to help with the pain besides Tramadol.

## 2013-10-31 NOTE — Telephone Encounter (Signed)
Patient states she would like a med refill on predniSONE (DELTASONE) 20 MG tablet [[id number]. Please advise at [phone]

## 2013-10-31 NOTE — Telephone Encounter (Signed)
Pt advised.

## 2014-02-26 DIAGNOSIS — H40013 Open angle with borderline findings, low risk, bilateral: Secondary | ICD-10-CM | POA: Diagnosis not present

## 2014-02-26 DIAGNOSIS — H2513 Age-related nuclear cataract, bilateral: Secondary | ICD-10-CM | POA: Diagnosis not present

## 2014-02-26 DIAGNOSIS — H25013 Cortical age-related cataract, bilateral: Secondary | ICD-10-CM | POA: Diagnosis not present

## 2014-03-15 ENCOUNTER — Encounter (HOSPITAL_COMMUNITY): Payer: Self-pay

## 2014-03-15 ENCOUNTER — Observation Stay (HOSPITAL_COMMUNITY)
Admission: EM | Admit: 2014-03-15 | Discharge: 2014-03-16 | Disposition: A | Discharge status: Home / Self Care | Payer: Medicare Other | Attending: Internal Medicine | Admitting: Internal Medicine

## 2014-03-15 ENCOUNTER — Other Ambulatory Visit (HOSPITAL_COMMUNITY): Payer: Self-pay

## 2014-03-15 DIAGNOSIS — R5383 Other fatigue: Secondary | ICD-10-CM

## 2014-03-15 DIAGNOSIS — M170 Bilateral primary osteoarthritis of knee: Secondary | ICD-10-CM | POA: Diagnosis not present

## 2014-03-15 DIAGNOSIS — I129 Hypertensive chronic kidney disease with stage 1 through stage 4 chronic kidney disease, or unspecified chronic kidney disease: Secondary | ICD-10-CM | POA: Insufficient documentation

## 2014-03-15 DIAGNOSIS — E669 Obesity, unspecified: Secondary | ICD-10-CM | POA: Diagnosis not present

## 2014-03-15 DIAGNOSIS — K5730 Diverticulosis of large intestine without perforation or abscess without bleeding: Secondary | ICD-10-CM | POA: Diagnosis not present

## 2014-03-15 DIAGNOSIS — I441 Atrioventricular block, second degree: Secondary | ICD-10-CM

## 2014-03-15 DIAGNOSIS — R42 Dizziness and giddiness: Secondary | ICD-10-CM | POA: Diagnosis not present

## 2014-03-15 DIAGNOSIS — D649 Anemia, unspecified: Secondary | ICD-10-CM | POA: Insufficient documentation

## 2014-03-15 DIAGNOSIS — R9431 Abnormal electrocardiogram [ECG] [EKG]: Secondary | ICD-10-CM | POA: Diagnosis present

## 2014-03-15 DIAGNOSIS — R197 Diarrhea, unspecified: Secondary | ICD-10-CM | POA: Insufficient documentation

## 2014-03-15 DIAGNOSIS — M19042 Primary osteoarthritis, left hand: Secondary | ICD-10-CM | POA: Insufficient documentation

## 2014-03-15 DIAGNOSIS — I4581 Long QT syndrome: Secondary | ICD-10-CM | POA: Insufficient documentation

## 2014-03-15 DIAGNOSIS — M810 Age-related osteoporosis without current pathological fracture: Secondary | ICD-10-CM | POA: Diagnosis present

## 2014-03-15 DIAGNOSIS — R0600 Dyspnea, unspecified: Secondary | ICD-10-CM

## 2014-03-15 DIAGNOSIS — I440 Atrioventricular block, first degree: Secondary | ICD-10-CM | POA: Diagnosis not present

## 2014-03-15 DIAGNOSIS — R7301 Impaired fasting glucose: Secondary | ICD-10-CM | POA: Diagnosis not present

## 2014-03-15 DIAGNOSIS — R531 Weakness: Secondary | ICD-10-CM | POA: Diagnosis not present

## 2014-03-15 DIAGNOSIS — R112 Nausea with vomiting, unspecified: Secondary | ICD-10-CM | POA: Diagnosis not present

## 2014-03-15 DIAGNOSIS — E876 Hypokalemia: Secondary | ICD-10-CM | POA: Diagnosis not present

## 2014-03-15 DIAGNOSIS — I38 Endocarditis, valve unspecified: Secondary | ICD-10-CM | POA: Insufficient documentation

## 2014-03-15 DIAGNOSIS — M19041 Primary osteoarthritis, right hand: Secondary | ICD-10-CM | POA: Insufficient documentation

## 2014-03-15 DIAGNOSIS — Z6833 Body mass index (BMI) 33.0-33.9, adult: Secondary | ICD-10-CM | POA: Diagnosis not present

## 2014-03-15 DIAGNOSIS — E785 Hyperlipidemia, unspecified: Secondary | ICD-10-CM | POA: Insufficient documentation

## 2014-03-15 DIAGNOSIS — I10 Essential (primary) hypertension: Secondary | ICD-10-CM | POA: Diagnosis present

## 2014-03-15 DIAGNOSIS — E860 Dehydration: Secondary | ICD-10-CM | POA: Diagnosis not present

## 2014-03-15 DIAGNOSIS — N182 Chronic kidney disease, stage 2 (mild): Secondary | ICD-10-CM | POA: Diagnosis not present

## 2014-03-15 HISTORY — DX: Anemia, unspecified: D64.9

## 2014-03-15 LAB — HEPATIC FUNCTION PANEL
ALBUMIN: 3.9 g/dL (ref 3.5–5.2)
ALT: 21 U/L (ref 0–35)
AST: 23 U/L (ref 0–37)
Alkaline Phosphatase: 61 U/L (ref 39–117)
Bilirubin, Direct: <0.1 mg/dL (ref 0.0–0.5)
Total Bilirubin: 0.4 mg/dL (ref 0.3–1.2)
Total Protein: 7 g/dL (ref 6.0–8.3)

## 2014-03-15 LAB — PROTIME-INR
INR: 1.02 (ref 0.00–1.49)
Prothrombin Time: 13.5 seconds (ref 11.6–15.2)

## 2014-03-15 LAB — CBC
HCT: 26.4 % — ABNORMAL LOW (ref 36.0–46.0)
Hemoglobin: 8.9 g/dL — ABNORMAL LOW (ref 12.0–15.0)
MCH: 31.2 pg (ref 26.0–34.0)
MCHC: 33.7 g/dL (ref 30.0–36.0)
MCV: 92.6 fL (ref 78.0–100.0)
Platelets: 216 10*3/uL (ref 150–400)
RBC: 2.85 MIL/uL — ABNORMAL LOW (ref 3.87–5.11)
RDW: 14 % (ref 11.5–15.5)
WBC: 11.7 10*3/uL — ABNORMAL HIGH (ref 4.0–10.5)

## 2014-03-15 LAB — URINALYSIS, ROUTINE W REFLEX MICROSCOPIC
Bilirubin Urine: NEGATIVE
GLUCOSE, UA: NEGATIVE mg/dL
Hgb urine dipstick: NEGATIVE
Ketones, ur: NEGATIVE mg/dL
Leukocytes, UA: NEGATIVE
Nitrite: NEGATIVE
PROTEIN: NEGATIVE mg/dL
Specific Gravity, Urine: 1.012 (ref 1.005–1.030)
UROBILINOGEN UA: 0.2 mg/dL (ref 0.0–1.0)
pH: 6.5 (ref 5.0–8.0)

## 2014-03-15 LAB — BASIC METABOLIC PANEL
ANION GAP: 11 (ref 5–15)
BUN: 31 mg/dL — AB (ref 6–23)
CHLORIDE: 102 mmol/L (ref 96–112)
CO2: 23 mmol/L (ref 19–32)
CREATININE: 1 mg/dL (ref 0.50–1.10)
Calcium: 9.3 mg/dL (ref 8.4–10.5)
GFR calc Af Amer: 63 mL/min — ABNORMAL LOW (ref >90)
GFR calc non Af Amer: 55 mL/min — ABNORMAL LOW (ref >90)
Glucose, Bld: 128 mg/dL — ABNORMAL HIGH (ref 70–99)
Potassium: 2.9 mmol/L — ABNORMAL LOW (ref 3.5–5.1)
Sodium: 136 mmol/L (ref 135–145)

## 2014-03-15 LAB — TROPONIN I: Troponin I: <0.03 ng/mL (ref <0.031)

## 2014-03-15 LAB — LIPASE, BLOOD: LIPASE: 27 U/L (ref 11–59)

## 2014-03-15 LAB — BRAIN NATRIURETIC PEPTIDE: B NATRIURETIC PEPTIDE 5: 166 pg/mL — AB (ref 0.0–100.0)

## 2014-03-15 MED ORDER — POTASSIUM CHLORIDE CRYS ER 20 MEQ PO TBCR
40.0000 meq | EXTENDED_RELEASE_TABLET | Freq: Once | ORAL | Status: AC
  Administered 2014-03-15: 40 meq via ORAL
  Filled 2014-03-15: qty 2

## 2014-03-15 MED ORDER — POTASSIUM CHLORIDE 10 MEQ/100ML IV SOLN
10.0000 meq | Freq: Once | INTRAVENOUS | Status: AC
  Administered 2014-03-15: 10 meq via INTRAVENOUS
  Filled 2014-03-15: qty 100

## 2014-03-15 NOTE — ED Notes (Signed)
EKG performed in triage

## 2014-03-15 NOTE — ED Notes (Signed)
Patient states she has been feeling weak and nauseated x 1 week. Today, patient was driving and felt dizzy and drove home. Patient denies any pain.

## 2014-03-15 NOTE — ED Provider Notes (Signed)
CSN: [id number]     Arrival date & time 03/15/14  1823 History   First MD Initiated Contact with Patient 03/15/14 2117     Chief Complaint  Patient presents with  . Weakness  . Dizziness     (Consider location/radiation/quality/duration/timing/severity/associated sxs/prior Treatment) HPI Patient had a vomiting illness about midweek. She reports that on Wednesday she felt very poorly and threw up several times. She continued to have some symptoms of nausea and weakness for the following day. This morning however she was feeling much better and went to church. On the drive home she became extremely nauseated, weak and sweaty. She called her daughter who came to check on her and found her to be very pale in appearance and sweating. The patient reports she's felt dizzy with position change. She denies she's had any chest pain. She does reports she's been feeling short of breath this week and getting winded easily. Past Medical History  Diagnosis Date  . Essential hypertension   . Osteoarthritis     knees and hands  . Systolic murmur     per patient report she has had rheumatic fever in childhood  . Osteoporosis, post-menopausal 2010    left femur T -2.7, left forearm T -3.5  . Blood transfusion without reported diagnosis     1960's, unknown reason  . Post-menopausal atrophic vaginitis   . Obesity, Class II, BMI 35-39.9   . Anemia 03/16/2014  . Chronic kidney disease (CKD), stage II (mild) 03/16/2014  . Hyperlipidemia 03/16/2014   Past Surgical History  Procedure Laterality Date  . Abdominal hysterectomy      Fibroids   Family History  Problem Relation Age of Onset  . Cancer Mother     Unknown type  . Stroke Father   . Unexplained death Sister   . Other Brother     Unknown health  . Healthy Daughter   . Healthy Son   . Asthma Sister   . Other Sister     Unknown health  . Other Brother     Unknown health  . Other Brother     Unknown health  . Cancer Brother     Unknown  type  . Cancer Brother     Unknown type  . Other Brother     Accident  . Other Brother     Garment/textile technologistndustrial accident  . Healthy Daughter   . Healthy Daughter    History  Substance Use Topics  . Smoking status: Never Smoker   . Smokeless tobacco: Never Used  . Alcohol Use: No   OB History    No data available     Review of Systems 10 Systems reviewed and are negative for acute change except as noted in the HPI.    Allergies  Ace inhibitors  Home Medications   Prior to Admission medications   Medication Sig Start Date End Date Taking? Authorizing Provider  aspirin 81 MG tablet Take 81 mg by mouth daily.     Yes Historical Provider, MD  Calcium Carbonate-Vitamin D (CALTRATE 600+D) 600-400 MG-UNIT per tablet Take 1 tablet by mouth daily. 04/26/12  Yes Doneen PoissonLawrence Klima, MD  Cholecalciferol (VITAMIN D PO) Take 500 Units by mouth 2 (two) times daily.   Yes Historical Provider, MD  diclofenac (VOLTAREN) 50 MG EC tablet Take 1 tablet (50 mg total) by mouth 2 (two) times daily. 05/23/13  Yes Doneen PoissonLawrence Klima, MD  predniSONE (DELTASONE) 20 MG tablet Take 2 tablets (40 mg total) by mouth daily.  10/31/13  Yes Elvina Sidle, MD  traMADol (ULTRAM) 50 MG tablet Take 50 mg by mouth every 6 (six) hours as needed for moderate pain or severe pain (pain).    Yes Historical Provider, MD  potassium chloride (K-DUR) 10 MEQ tablet Take 2 tablets (20 mEq total) by mouth daily. 03/16/14   Stark Bray, MD   BP 100/62 mmHg  Pulse 69  Temp(Src) 98.6 F (37 C) (Oral)  Resp 18  Ht  (1.626 m)  Wt 196 lb 6.9 oz (89.1 kg)  BMI 33.70 kg/m2  SpO2 100% Physical Exam  Constitutional: She is oriented to person, place, and time. She appears well-developed and well-nourished.  HENT:  Head: Normocephalic and atraumatic.  Eyes: EOM are normal. Pupils are equal, round, and reactive to light.  Neck: Neck supple.  Cardiovascular: Normal rate, normal heart sounds and intact distal pulses.   Occasional ectopic  beats.  Pulmonary/Chest: Effort normal and breath sounds normal.  Abdominal: Soft. Bowel sounds are normal. She exhibits no distension. There is no tenderness.  Musculoskeletal: Normal range of motion. She exhibits no edema.  Neurological: She is alert and oriented to person, place, and time. She has normal strength. Coordination normal. GCS eye subscore is 4. GCS verbal subscore is 5. GCS motor subscore is 6.  Skin: Skin is warm, dry and intact. There is pallor.  Psychiatric: She has a normal mood and affect.    ED Course  Procedures (including critical care time) Labs Review Labs Reviewed  CBC - Abnormal; Notable for the following:    WBC 11.7 (*)    RBC 2.85 (*)    Hemoglobin 8.9 (*)    HCT 26.4 (*)    All other components within normal limits  BASIC METABOLIC PANEL - Abnormal; Notable for the following:    Potassium 2.9 (*)    Glucose, Bld 128 (*)    BUN 31 (*)    GFR calc non Af Amer 55 (*)    GFR calc Af Amer 63 (*)    All other components within normal limits  BRAIN NATRIURETIC PEPTIDE - Abnormal; Notable for the following:    B Natriuretic Peptide 166.0 (*)    All other components within normal limits  BASIC METABOLIC PANEL - Abnormal; Notable for the following:    Potassium 3.1 (*)    Glucose, Bld 104 (*)    GFR calc non Af Amer 84 (*)    All other components within normal limits  VITAMIN D 25 HYDROXY - Abnormal; Notable for the following:    Vit D, 25-Hydroxy 25.2 (*)    All other components within normal limits  IRON AND TIBC - Abnormal; Notable for the following:    Saturation Ratios 14 (*)    All other components within normal limits  RETICULOCYTES - Abnormal; Notable for the following:    Retic Ct Pct 6.2 (*)    RBC. 2.77 (*)    All other components within normal limits  LIPID PANEL - Abnormal; Notable for the following:    HDL 36 (*)    All other components within normal limits  CBC WITH DIFFERENTIAL/PLATELET - Abnormal; Notable for the following:    RBC  2.77 (*)    Hemoglobin 8.5 (*)    HCT 25.2 (*)    All other components within normal limits  BASIC METABOLIC PANEL - Abnormal; Notable for the following:    Potassium 3.2 (*)    Glucose, Bld 110 (*)    GFR calc  non Af Amer 84 (*)    All other components within normal limits  POC OCCULT BLOOD, ED - Abnormal; Notable for the following:    Fecal Occult Bld POSITIVE (*)    All other components within normal limits  HEPATIC FUNCTION PANEL  LIPASE, BLOOD  TROPONIN I  PROTIME-INR  URINALYSIS, ROUTINE W REFLEX MICROSCOPIC  MAGNESIUM  PHOSPHORUS  TSH  CK  APTT  HEMOGLOBIN A1C  TECHNOLOGIST SMEAR REVIEW  VITAMIN B12  FOLATE  FERRITIN  LACTIC ACID, PLASMA  TROPONIN I  HIV ANTIBODY (ROUTINE TESTING)  TYPE AND SCREEN  ABO/RH    Imaging Review No results found.   EKG Interpretation None      Consult: Patient's case was reviewed with Dr. Mayford Knife. We reviewed the EKG and determined that the pattern was awake he LOC. At this time she is instructed to hold Tenoretic and observed.  MDM   Final diagnoses:  Anemia, unspecified anemia type  Dyspnea  Wenckebach second degree AV block   The patient presents with weakness and dyspnea. There are several possible etiologies. The patient is anemic without significant orthostasis. She has not had any active bleeding that she is noted she's had no blood noted in her emesis. Rectal examination shows brown stool that is non-melanotic. The other issue today is wenkebach.    Arby Barrette, MD 03/19/14 [phone]

## 2014-03-16 ENCOUNTER — Encounter (HOSPITAL_COMMUNITY): Payer: Self-pay | Admitting: Emergency Medicine

## 2014-03-16 ENCOUNTER — Observation Stay (HOSPITAL_COMMUNITY): Payer: Medicare Other

## 2014-03-16 DIAGNOSIS — R5383 Other fatigue: Secondary | ICD-10-CM | POA: Diagnosis not present

## 2014-03-16 DIAGNOSIS — I129 Hypertensive chronic kidney disease with stage 1 through stage 4 chronic kidney disease, or unspecified chronic kidney disease: Secondary | ICD-10-CM

## 2014-03-16 DIAGNOSIS — K5730 Diverticulosis of large intestine without perforation or abscess without bleeding: Secondary | ICD-10-CM | POA: Insufficient documentation

## 2014-03-16 DIAGNOSIS — R42 Dizziness and giddiness: Secondary | ICD-10-CM

## 2014-03-16 DIAGNOSIS — E876 Hypokalemia: Secondary | ICD-10-CM | POA: Diagnosis present

## 2014-03-16 DIAGNOSIS — R110 Nausea: Secondary | ICD-10-CM | POA: Diagnosis not present

## 2014-03-16 DIAGNOSIS — E785 Hyperlipidemia, unspecified: Secondary | ICD-10-CM | POA: Diagnosis not present

## 2014-03-16 DIAGNOSIS — M810 Age-related osteoporosis without current pathological fracture: Secondary | ICD-10-CM | POA: Diagnosis not present

## 2014-03-16 DIAGNOSIS — D649 Anemia, unspecified: Secondary | ICD-10-CM | POA: Diagnosis not present

## 2014-03-16 DIAGNOSIS — E860 Dehydration: Secondary | ICD-10-CM

## 2014-03-16 DIAGNOSIS — R531 Weakness: Secondary | ICD-10-CM | POA: Diagnosis not present

## 2014-03-16 DIAGNOSIS — I441 Atrioventricular block, second degree: Secondary | ICD-10-CM | POA: Diagnosis not present

## 2014-03-16 DIAGNOSIS — K648 Other hemorrhoids: Secondary | ICD-10-CM | POA: Insufficient documentation

## 2014-03-16 DIAGNOSIS — R7301 Impaired fasting glucose: Secondary | ICD-10-CM

## 2014-03-16 DIAGNOSIS — R9431 Abnormal electrocardiogram [ECG] [EKG]: Secondary | ICD-10-CM | POA: Diagnosis present

## 2014-03-16 DIAGNOSIS — N182 Chronic kidney disease, stage 2 (mild): Secondary | ICD-10-CM | POA: Diagnosis present

## 2014-03-16 LAB — IRON AND TIBC
IRON: 44 ug/dL (ref 42–145)
Saturation Ratios: 14 % — ABNORMAL LOW (ref 20–55)
TIBC: 304 ug/dL (ref 250–470)
UIBC: 260 ug/dL (ref 125–400)

## 2014-03-16 LAB — CBC WITH DIFFERENTIAL/PLATELET
Basophils Absolute: 0 10*3/uL (ref 0.0–0.1)
Basophils Relative: 0 % (ref 0–1)
EOS ABS: 0.4 10*3/uL (ref 0.0–0.7)
Eosinophils Relative: 4 % (ref 0–5)
HEMATOCRIT: 25.2 % — AB (ref 36.0–46.0)
Hemoglobin: 8.5 g/dL — ABNORMAL LOW (ref 12.0–15.0)
LYMPHS PCT: 30 % (ref 12–46)
Lymphs Abs: 2.5 10*3/uL (ref 0.7–4.0)
MCH: 30.7 pg (ref 26.0–34.0)
MCHC: 33.7 g/dL (ref 30.0–36.0)
MCV: 91 fL (ref 78.0–100.0)
MONO ABS: 0.6 10*3/uL (ref 0.1–1.0)
Monocytes Relative: 8 % (ref 3–12)
Neutro Abs: 4.8 10*3/uL (ref 1.7–7.7)
Neutrophils Relative %: 58 % (ref 43–77)
Platelets: 198 10*3/uL (ref 150–400)
RBC: 2.77 MIL/uL — ABNORMAL LOW (ref 3.87–5.11)
RDW: 14.2 % (ref 11.5–15.5)
WBC: 8.3 10*3/uL (ref 4.0–10.5)

## 2014-03-16 LAB — BASIC METABOLIC PANEL
ANION GAP: 7 (ref 5–15)
Anion gap: 6 (ref 5–15)
BUN: 17 mg/dL (ref 6–23)
BUN: 15 mg/dL (ref 6–23)
CALCIUM: 9.1 mg/dL (ref 8.4–10.5)
CO2: 26 mmol/L (ref 19–32)
CO2: 26 mmol/L (ref 19–32)
CREATININE: 0.69 mg/dL (ref 0.50–1.10)
Calcium: 9.2 mg/dL (ref 8.4–10.5)
Chloride: 107 mmol/L (ref 96–112)
Chloride: 103 mmol/L (ref 96–112)
Creatinine, Ser: 0.69 mg/dL (ref 0.50–1.10)
GFR calc Af Amer: >90 mL/min (ref >90)
GFR calc Af Amer: >90 mL/min (ref >90)
GFR, EST NON AFRICAN AMERICAN: 84 mL/min — AB (ref >90)
GFR, EST NON AFRICAN AMERICAN: 84 mL/min — AB (ref >90)
GLUCOSE: 104 mg/dL — AB (ref 70–99)
GLUCOSE: 110 mg/dL — AB (ref 70–99)
POTASSIUM: 3.1 mmol/L — AB (ref 3.5–5.1)
Potassium: 3.2 mmol/L — ABNORMAL LOW (ref 3.5–5.1)
SODIUM: 135 mmol/L (ref 135–145)
Sodium: 140 mmol/L (ref 135–145)

## 2014-03-16 LAB — LIPID PANEL
Cholesterol: 129 mg/dL (ref 0–200)
HDL: 36 mg/dL — AB (ref >39)
LDL CALC: 79 mg/dL (ref 0–99)
TRIGLYCERIDES: 70 mg/dL (ref <150)
Total CHOL/HDL Ratio: 3.6 RATIO
VLDL: 14 mg/dL (ref 0–40)

## 2014-03-16 LAB — RETICULOCYTES
RBC.: 2.77 MIL/uL — ABNORMAL LOW (ref 3.87–5.11)
RETIC COUNT ABSOLUTE: 171.7 10*3/uL (ref 19.0–186.0)
Retic Ct Pct: 6.2 % — ABNORMAL HIGH (ref 0.4–3.1)

## 2014-03-16 LAB — HIV ANTIBODY (ROUTINE TESTING W REFLEX): HIV Screen 4th Generation wRfx: NONREACTIVE

## 2014-03-16 LAB — FERRITIN: Ferritin: 41 ng/mL (ref 10–291)

## 2014-03-16 LAB — TYPE AND SCREEN
ABO/RH(D): O POS
Antibody Screen: NEGATIVE

## 2014-03-16 LAB — LACTIC ACID, PLASMA: Lactic Acid, Venous: 1.5 mmol/L (ref 0.5–2.0)

## 2014-03-16 LAB — MAGNESIUM: Magnesium: 1.8 mg/dL (ref 1.5–2.5)

## 2014-03-16 LAB — TROPONIN I

## 2014-03-16 LAB — VITAMIN B12: Vitamin B-12: 568 pg/mL (ref 211–911)

## 2014-03-16 LAB — TSH: TSH: 3.297 u[IU]/mL (ref 0.350–4.500)

## 2014-03-16 LAB — TECHNOLOGIST SMEAR REVIEW

## 2014-03-16 LAB — PHOSPHORUS: PHOSPHORUS: 3.2 mg/dL (ref 2.3–4.6)

## 2014-03-16 LAB — POC OCCULT BLOOD, ED: Fecal Occult Bld: POSITIVE — AB

## 2014-03-16 LAB — FOLATE: FOLATE: 8.7 ng/mL

## 2014-03-16 LAB — ABO/RH: ABO/RH(D): O POS

## 2014-03-16 LAB — CK: Total CK: 143 U/L (ref 7–177)

## 2014-03-16 LAB — APTT: APTT: 29 s (ref 24–37)

## 2014-03-16 MED ORDER — ACETAMINOPHEN 650 MG RE SUPP: 650.0000 mg | Freq: Four times a day (QID) | RECTAL | Status: DC | PRN

## 2014-03-16 MED ORDER — ONDANSETRON HCL 4 MG PO TABS: 4.0000 mg | ORAL_TABLET | Freq: Four times a day (QID) | ORAL | Status: DC | PRN

## 2014-03-16 MED ORDER — PROMETHAZINE HCL 25 MG/ML IJ SOLN: 12.5000 mg | Freq: Four times a day (QID) | INTRAMUSCULAR | Status: DC | PRN

## 2014-03-16 MED ORDER — ONDANSETRON HCL 4 MG/2ML IJ SOLN: 4.0000 mg | Freq: Four times a day (QID) | INTRAMUSCULAR | Status: DC | PRN

## 2014-03-16 MED ORDER — CHOLECALCIFEROL 10 MCG (400 UNIT) PO TABS
400.0000 [IU] | ORAL_TABLET | Freq: Every day | ORAL | Status: DC
  Administered 2014-03-16: 400 [IU] via ORAL
  Filled 2014-03-16: qty 1

## 2014-03-16 MED ORDER — CALCIUM CARBONATE-VITAMIN D 600-400 MG-UNIT PO TABS: 1.0000 | ORAL_TABLET | Freq: Every day | ORAL | Status: DC

## 2014-03-16 MED ORDER — POTASSIUM CHLORIDE ER 10 MEQ PO TBCR: 20.0000 meq | EXTENDED_RELEASE_TABLET | Freq: Every day | ORAL | Status: DC

## 2014-03-16 MED ORDER — SODIUM CHLORIDE 0.9 % IV SOLN
INTRAVENOUS | Status: AC
  Administered 2014-03-16: 06:00:00 via INTRAVENOUS

## 2014-03-16 MED ORDER — POTASSIUM CHLORIDE 10 MEQ/100ML IV SOLN
10.0000 meq | INTRAVENOUS | Status: DC
Start: 2014-03-16 — End: 2014-03-16

## 2014-03-16 MED ORDER — ACETAMINOPHEN 325 MG PO TABS: 650.0000 mg | ORAL_TABLET | Freq: Four times a day (QID) | ORAL | Status: DC | PRN

## 2014-03-16 MED ORDER — SODIUM CHLORIDE 0.9 % IV SOLN
INTRAVENOUS | Status: AC
  Administered 2014-03-16: 05:00:00 via INTRAVENOUS

## 2014-03-16 MED ORDER — POTASSIUM CHLORIDE CRYS ER 20 MEQ PO TBCR
40.0000 meq | EXTENDED_RELEASE_TABLET | Freq: Once | ORAL | Status: AC
  Administered 2014-03-16: 40 meq via ORAL
  Filled 2014-03-16: qty 2

## 2014-03-16 MED ORDER — ASPIRIN EC 81 MG PO TBEC
81.0000 mg | DELAYED_RELEASE_TABLET | Freq: Every day | ORAL | Status: DC
  Administered 2014-03-16: 81 mg via ORAL
  Filled 2014-03-16: qty 1

## 2014-03-16 MED ORDER — CALCIUM CARBONATE-VITAMIN D 500-200 MG-UNIT PO TABS
1.0000 | ORAL_TABLET | Freq: Every day | ORAL | Status: DC
  Administered 2014-03-16: 1 via ORAL
  Filled 2014-03-16 (×2): qty 1

## 2014-03-16 MED ORDER — POTASSIUM CHLORIDE CRYS ER 20 MEQ PO TBCR
40.0000 meq | EXTENDED_RELEASE_TABLET | ORAL | Status: DC
  Administered 2014-03-16: 40 meq via ORAL
  Filled 2014-03-16: qty 2

## 2014-03-16 MED ORDER — DICLOFENAC SODIUM 50 MG PO TBEC
50.0000 mg | DELAYED_RELEASE_TABLET | Freq: Two times a day (BID) | ORAL | Status: DC
  Administered 2014-03-16: 50 mg via ORAL
  Filled 2014-03-16 (×2): qty 1

## 2014-03-16 NOTE — Discharge Instructions (Signed)
Please DO NOT take your blood pressure medications until your appointment at the clinic (this includes Norvasc, Cozaar and Tenoretic.  Please start taking kdur (potassium supplement) 20 mg daily until your appointment at the clinic.  Please continue to drink plenty of fluids.

## 2014-03-16 NOTE — Evaluation (Signed)
Physical Therapy Evaluation/ Discharge Patient Details Name: Erma HeritageLizzie M Lambson MRN: [id number] DOB: 1940-06-28 Today's Date: 03/16/2014   History of Present Illness  Mrs Annia FriendlyBeard is a 74 year old female with hypertension, hyperlipidemia, osteoporosis, CKD Stage 2 presents with dizziness and weakness  Clinical Impression  Pt very pleasant and moving well. Pt reports no dizziness today and that with further probing she felt off balance not dizzy. Performed bil Dix Hallpike and supine head roll all of which were negative. Pt currently at baseline function with altered gait secondary to bad knees per pt. Pt agreeable she is at her baseline, unable to reproduce symptoms and currently no further therapy needs. Will sign off with pt aware and in agreement with encouragement to continued ambulate acutely with nursing assist for line.    Follow Up Recommendations No PT follow up    Equipment Recommendations  None recommended by PT    Recommendations for Other Services       Precautions / Restrictions Precautions Precautions: None      Mobility  Bed Mobility Overal bed mobility: Modified Independent                Transfers Overall transfer level: Modified independent                  Ambulation/Gait Ambulation/Gait assistance: Modified independent (Device/Increase time) Ambulation Distance (Feet): 350 Feet Assistive device: None Gait Pattern/deviations: Wide base of support   Gait velocity interpretation: at or above normal speed for age/gender General Gait Details: pt with wide based gait with increased sway secondary to bil decreased knee flexion with gait. Pt reports baseline bil knees with no plans for replacements  Stairs            Wheelchair Mobility    Modified Rankin (Stroke Patients Only)       Balance Overall balance assessment: No apparent balance deficits (not formally assessed)                                            Pertinent Vitals/Pain Pain Assessment: No/denies pain    Home Living Family/patient expects to be discharged to:: Private residence Living Arrangements: Spouse/significant other Available Help at Discharge: Family Type of Home: House Home Access: Level entry     Home Layout: One level Home Equipment: None      Prior Function Level of Independence: Independent         Comments: pt cares for 74 yo during the day 10hrs 5 days aweek and cares for her disabled husband at home     Hand Dominance        Extremity/Trunk Assessment   Upper Extremity Assessment: Generalized weakness           Lower Extremity Assessment: Generalized weakness      Cervical / Trunk Assessment: Kyphotic  Communication   Communication: No difficulties  Cognition Arousal/Alertness: Awake/alert Behavior During Therapy: WFL for tasks assessed/performed Overall Cognitive Status: Within Functional Limits for tasks assessed                      General Comments      Exercises        Assessment/Plan    PT Assessment Patent does not need any further PT services  PT Diagnosis Abnormality of gait   PT Problem List    PT Treatment Interventions  PT Goals (Current goals can be found in the Care Plan section) Acute Rehab PT Goals PT Goal Formulation: All assessment and education complete, DC therapy    Frequency     Barriers to discharge        Co-evaluation               End of Session   Activity Tolerance: Patient tolerated treatment well Patient left: in chair;with call bell/phone within reach      Functional Assessment Tool Used: clinical judgement Functional Limitation: Mobility: Walking and moving around Mobility: Walking and Moving Around Current Status (W0981): At least 1 percent but less than 20 percent impaired, limited or restricted Mobility: Walking and Moving Around Goal Status [phone]): At least 1 percent but less than 20 percent impaired,  limited or restricted Mobility: Walking and Moving Around Discharge Status [phone]): At least 1 percent but less than 20 percent impaired, limited or restricted    Time: 2130-8657 PT Time Calculation (min) (ACUTE ONLY): 18 min   Charges:   PT Evaluation $Initial PT Evaluation Tier I: 1 Procedure     PT G Codes:   PT G-Codes **NOT FOR INPATIENT CLASS** Functional Assessment Tool Used: clinical judgement Functional Limitation: Mobility: Walking and moving around Mobility: Walking and Moving Around Current Status (Q4696): At least 1 percent but less than 20 percent impaired, limited or restricted Mobility: Walking and Moving Around Goal Status [phone]): At least 1 percent but less than 20 percent impaired, limited or restricted Mobility: Walking and Moving Around Discharge Status [phone]): At least 1 percent but less than 20 percent impaired, limited or restricted    Delorse Lek 03/16/2014, 10:40 AM Delaney Meigs, PT [phone]

## 2014-03-16 NOTE — ED Notes (Signed)
[facility] called for transport. 

## 2014-03-16 NOTE — Progress Notes (Signed)
PT Cancellation Note  Patient Details Name: [name] MRN: [id number] DOB: July 18, 1940   Cancelled Treatment:    Reason Eval/Treat Not Completed: Medical issues which prohibited therapy (pt currently on strict bedrest and unable to evaluate at this time)   Delorse Lekabor, Marylan Glore Beth 03/16/2014, 10:01 AM Delaney MeigsMaija Tabor Primitivo Merkey, PT [phone]

## 2014-03-16 NOTE — Progress Notes (Signed)
Subjective: [name] feels much better today. She no longer feels weak or dizzy, and was even able to walk around the halls with the physical therapist.  Objective: Vital signs in last 24 hours: Filed Vitals:   04-11-14 0000 04/11/14 0107 2014-04-11 0353 04/11/2014 0400  BP: 110/74 105/70  97/58  Pulse: 73 72  72  Temp:  98.4 F (36.9 C)  98 F (36.7 C)  TempSrc:  Oral  Oral  Resp: Height:    (1.626 m)   Weight:   196 lb 6.9 oz (89.1 kg)   SpO2: 100% 99%  100%   Weight change:  No intake or output data in the 24 hours ending 04-11-14 0745   Physical Exam: Appearance: elderly woman sitting up in her chair, in NAD, very pleasant HEENT: AT/[place], PERRL, EOMi, no lymphadenopathy, pale mucous membranes, significant ptosis OS Heart: RRR, SEM greatest at R upper sternal border Lungs: normal work of breathing, CTAB, no wheezes Abdomen: BS+, soft, nontender, nondistended Musculoskeletal: normal range of motion Extremities: no edema Neurologic: A&Ox3, grossly intact Skin: no rashes or lesions  Lab Results: Basic Metabolic Panel:  Recent Labs Lab 03/15/14 1859 04/11/14 0616  NA 136 140  K 2.9* 3.1*  CL 102 107  CO2 23 26  GLUCOSE 128* 104*  BUN 31* 17  CREATININE 1.00 0.69  CALCIUM 9.3 9.1  MG  --  1.8  PHOS  --  3.2   Liver Function Tests:  Recent Labs Lab 03/15/14 2158  AST 23  ALT 21  ALKPHOS 61  BILITOT 0.4  PROT 7.0  ALBUMIN 3.9    Recent Labs Lab 03/15/14 2158  LIPASE 27   CBC:  Recent Labs Lab 03/15/14 1859 2014-04-11 0616  WBC 11.7* 8.3  NEUTROABS  --  4.8  HGB 8.9* 8.5*  HCT 26.4* 25.2*  MCV 92.6 91.0  PLT 216 198   Cardiac Enzymes:  Recent Labs Lab 03/15/14 2158 11-Apr-2014 0616  CKTOTAL  --  143  TROPONINI <0.03 <0.03   Fasting Lipid Panel:  Recent Labs Lab 04/11/2014 0616  CHOL 129  HDL 36*  LDLCALC 79  TRIG 70  CHOLHDL 3.6   Thyroid Function Tests:  Recent Labs Lab 04/11/14 0616  TSH 3.297    Coagulation:  Recent Labs Lab 03/15/14 2158  LABPROT 13.5  INR 1.02   Anemia Panel:  Recent Labs Lab 04-11-14 0616  RETICCTPCT 6.2*   Urinalysis:  Recent Labs Lab 03/15/14 2215  COLORURINE YELLOW  LABSPEC 1.012  PHURINE 6.5  GLUCOSEU NEGATIVE  HGBUR NEGATIVE  BILIRUBINUR NEGATIVE  KETONESUR NEGATIVE  PROTEINUR NEGATIVE  UROBILINOGEN 0.2  NITRITE NEGATIVE  LEUKOCYTESUR NEGATIVE   Studies/Results: Dg Chest 2 View  04/11/2014   CLINICAL DATA:  Week and is nauseated.  EXAM: CHEST  2 VIEW  COMPARISON:  07/17/2003  FINDINGS: The heart size and mediastinal contours are within normal limits. Both lungs are clear. The visualized skeletal structures are unremarkable.  IMPRESSION: No active cardiopulmonary disease.   Electronically Signed   By: Ellery Plunk M.D.   On: 04/11/2014 01:56   EKG: 03/15/14: No prior EKGs. Prolonged PR interval with dropped beats suggestive of second degree type I heart block  04-11-14: heart block less pronounced  Medications: I have reviewed the patient's current medications. Scheduled Meds: . sodium chloride   Intravenous STAT  . calcium-vitamin D  1 tablet Oral Q breakfast  . potassium chloride SA  40 mEq Oral Once  Continuous Infusions: . sodium chloride 100 mL/hr at 03/16/14 0601   PRN Meds:.acetaminophen **OR** acetaminophen, promethazine Assessment/Plan: Principal Problem:   Generalized weakness Active Problems:   Essential hypertension   Osteoporosis, post-menopausal   Normocytic anemia   Hypokalemia   Prolonged Q-T interval on ECG   Second degree heart block   Elevated fasting glucose   Chronic kidney disease (CKD), stage II (mild)   Hyperlipidemia  [name] is a very pleasant 74 yo woman admitted overnight for dizziness and weakness. She initially appeared dry and slightly pale on exam. With IVF, her symptoms fully resolved, but her hemoglobin did result at 8.9 (no prior CBC for comparison) and her potassium was low  at 2.9. She has GI follow up tomorrow and she is receiving potassium supplementation.  Resolved Dizziness and Weakness: May be due to vertigo or presyncope in the context of nausea and vomiting (likely a GI virus). She appears dry and is on home diuretics, though she was not orthostatic. Her heart block may be new (no prior EKGs) and could be contributing to her symptoms. EF 65-75% without wall motion abnormalities and mild focal basal septal hypertrophy on echo 04/09/2007. BNP 166 in the ED. Troponin negative. Of note, she reports a history of rheumatic fever as a child (has an aortic murmur and 2009 echo comments on a possible trileaflet aortic valve). Some concern for GI bleed in her history of dark stools, though she also has been taking a supplement for her hair and nails that likely contains iron. Her hemoglobin was 8.9 with no known prior. Labs show normocytic anemia with normal iron, normal TIBC, low saturation, normal ferritin. Prior colonoscopy 02/06/11 notable for small internal hemorrhoids and sigmoid diverticulosis. Lipase reassuring for no pancreatic process. Chest x-ray reassuring for no acute infiltrate. LA, CK, TSH, Magnesium, Phosphorus WNL. FOBT + in ED. - Appreciate PT assessment; no need for continued PT - Continue telemetry - Patient has follow up tomorrow with GI (Dr. Loreta Ave) - Give Tylenol 650 mg as needed every 6 hours for fever - Repeat echo pending - Continue Phenergan 12.5 mg IV every 6 hours for nausea and vomiting  Resolving Hypokalemia: Likely in the context of home diuretic. Supplementing. 2.9-->3.1-->pending - Continue kdur supplementation today and as outpatient - Will need repeat BMET to trend K at follow up appointment  Essential Hypertension: Currently normotensive with some episodes of hypotension last night. Home meds include amlodipine 10 mg, atenolol-chlorthalidone 100-25 milligrams, losartan 100 mg. - Hold home medications  Osteoporosis: At her last office visit  with Dr. Josem Kaufmann on 05/23/13, she was noted to be nonadherent to her calcium, vitamin D, Fosamax. She was to call back for a time during which she could schedule her DEXA scan. - Check vitamin D - Continue Os-Cal with vitamin D 1 tablet with breakfast  Elevated Fasting Glucose: Glucose 128 on admission. - A1c pending  Chronic Kidney Disease (CKD), Stage II: Baseline creatinine 0.6 to 0.8. Cr 1.0 on admission, likely in the setting of her acute illness. - Continue to trend  Hyperlipidemia: Total cholesterol 129, LDL 79, HDL 36, Triglycerides 70. This panel is improved from 2014.  Diet: Regular  DVT ppx: SCDs in context of low hgb   Dispo: Disposition is deferred at this time, awaiting improvement of current medical problems.  Anticipated discharge in approximately 0 day(s).   The patient does have a current PCP Doneen Poisson, MD) and does need an [facility] hospital follow-up appointment after discharge.  The patient does not  know have transportation limitations that hinder transportation to clinic appointments.  .Services Needed at time of discharge: Y = Yes, Blank = No PT:   OT:   RN:   Equipment:   Other:       Stark BrayJulia E Cambren Helm, MD 03/16/2014, 7:45 AM

## 2014-03-16 NOTE — Discharge Summary (Signed)
Name: [name] MRN: [id number] DOB: 02-26-1940 74 y.o. PCP: Doneen Poisson, MD  Date of Admission: 03/15/2014  6:44 PM Date of Discharge: 03/16/2014 Attending Physician: Earl Lagos, MD  Discharge Diagnosis: Principal Problem:   Generalized weakness Active Problems:   Essential hypertension   Osteoporosis, post-menopausal   Normocytic anemia   Hypokalemia   Prolonged Q-T interval on ECG   Second degree heart block   Elevated fasting glucose   Chronic kidney disease (CKD), stage II (mild)   Hyperlipidemia  Discharge Medications:   Medication List    STOP taking these medications        amLODipine 10 MG tablet  Commonly known as:  NORVASC     atenolol-chlorthalidone 100-25 MG per tablet  Commonly known as:  TENORETIC     losartan 100 MG tablet  Commonly known as:  COZAAR      TAKE these medications        aspirin 81 MG tablet  Take 81 mg by mouth daily.     Calcium Carbonate-Vitamin D 600-400 MG-UNIT per tablet  Commonly known as:  CALTRATE 600+D  Take 1 tablet by mouth daily.     diclofenac 50 MG EC tablet  Commonly known as:  VOLTAREN  Take 1 tablet (50 mg total) by mouth 2 (two) times daily.     potassium chloride 10 MEQ tablet  Commonly known as:  K-DUR  Take 2 tablets (20 mEq total) by mouth daily.     predniSONE 20 MG tablet  Commonly known as:  DELTASONE  Take 2 tablets (40 mg total) by mouth daily.     traMADol 50 MG tablet  Commonly known as:  ULTRAM  Take 50 mg by mouth every 6 (six) hours as needed for moderate pain or severe pain (pain).     VITAMIN D PO  Take 500 Units by mouth 2 (two) times daily.        Disposition and follow-up:   [name] was discharged from [facility] in Stable condition.  At the hospital follow up visit please address:  1.  Hypertension: Holding BP medications until follow up appointment, as BP was ~100s / 60s in the hospital. Please take BP and reassess restarting these  medications.  Resolving Hypokalemia: Corrected with kdur 2.9-->3.2. Sent home on kdur. Please recheck.  Normocytic Anemia: Hemoglobin 8.9 with some concern for GI bleed (dark stools). GI follow up on 03/17/14.  Generalized Weakness: Resolved with IV fluids. Patient's initial EKG was concerning for Mobitz type I. Repeat EKG on 3/14 showed first degree A-V block. Patient neither symptomatic nor unstable. Please repeat EKG at follow-up appointment.    2.  Labs / imaging needed at time of follow-up: BMET for K and CBC for hemoglobin, EKG  3.  Pending labs/ test needing follow-up: A1c and vitamin D 25 hydroxy  Follow-up Appointments:     Follow-up Information    Follow up with Otis Brace, MD On 03/24/2014.   Specialty:  Internal Medicine   Why:  10:45   Contact information:   [address] [place] [address] [phone]       Follow up with Charna Elizabeth, MD On 03/17/2014.   Specialty:  Gastroenterology   Why:  2:45   Contact information:   [address], [place] [address] [phone]       Discharge Instructions: Please DO NOT take your blood pressure medications until your appointment at the clinic (this includes Norvasc,  Cozaar and Tenoretic.  Please start taking kdur (potassium supplement) 20 mg daily until your appointment at the clinic.  Please continue to drink plenty of fluids.  Consultations:    Procedures Performed:  Dg Chest 2 View  03/16/2014   CLINICAL DATA:  Week and is nauseated.  EXAM: CHEST  2 VIEW  COMPARISON:  07/17/2003  FINDINGS: The heart size and mediastinal contours are within normal limits. Both lungs are clear. The visualized skeletal structures are unremarkable.  IMPRESSION: No active cardiopulmonary disease.   Electronically Signed   By: Ellery Plunkaniel R Mitchell M.D.   On: 03/16/2014 01:56   Admission HPI: [name] is a 74 year old female with hypertension, hyperlipidemia, osteoporosis, CKD Stage 2 presents with dizziness and  weakness.  Six days ago, she reports coming home from work and experiencing the acute onset of nausea, nonbloody and nonbilious vomiting, diarrhea, dizziness. 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 Yesterday, she reports as she was driving home from church with her husband, she again noted the onset of nausea and to the point where she needed to come to the hospital. She describes her dizziness as "feeling as though I'm going to topple over" and improves when she lies supine though has never experienced this kind of sensation before. She also reports fatigue out of the ordinary and dark stools and recently started taking a multivitamin for hair, skin and nails but is unsure if it has iron in it. She denies any recent sick contacts, persistent abdominal pain, new food exposure, changes to her medication, syncope, muscle cramps, sneezing, runny nose, headache though reports a prior hysterectomy done some 20 years ago and "feeling as though she is smothered" at night over the past week. She has been vaccinated for the flu this year. In the ED, she was started on IV fluids and repleted with potassium. EKG was notable for second-degree, type I heart block.  Hospital Course by problem list: Principal Problem:   Generalized weakness Active Problems:   Essential hypertension   Osteoporosis, post-menopausal   Normocytic anemia   Hypokalemia   Prolonged Q-T interval on ECG   Second degree heart block   Elevated fasting glucose   Chronic kidney disease (CKD), stage II (mild)   Hyperlipidemia   [name] is a very pleasant 74 yo woman admitted overnight for dizziness and weakness. She initially appeared dry and slightly pale on exam. With IV fluids, her symptoms fully resolved, but her hemoglobin did result at 8.9 (no prior CBC for comparison) and her potassium was low at 2.9. Her fecal occult blood  test was positive and she reports some recent, dark stools. She has GI follow up the day after discharge, and she has been started on potassium supplementation.  Resolved Generalized Weakness: May be due to vertigo or presyncope in the context of nausea and vomiting (likely a GI virus). She appears dry and is on home diuretics, though she was not orthostatic. Also had an EKG concerning for heart block, which resolved, and concern for GI bleed (see below). Lipase reassuring for no pancreatic process. Chest x-ray reassuring for no acute infiltrate. LA, CK, TSH, Magnesium, Phosphorus WNL. FOBT + in ED. Patient has follow up tomorrow with GI (Dr. Loreta AveMann).  Concern for Heart Block: She had an EKG 3/13 concerning for Mobitz Type I (with no prior EKGs); repeat EKG 3/14 showed first degree A-V block.  EF 65-75% without wall motion abnormalities and mild focal basal septal hypertrophy on echo 04/09/2007. BNP 166 in the ED. Troponin negative. Of note, she reports a history of rheumatic fever as a child (has an aortic murmur and 2009 echo comments on a possible trileaflet aortic valve). Patient was neither symptomatic nor unstable.  Normocytic Anemia: Hemoglobin 8.9 on admission (no prior on record). FOBT+ in ED, with some concern for GI bleed in her history of dark stools, though she also has been taking a supplement for her hair and nails that likely contains iron. Labs show normocytic anemia with normal iron, normal TIBC, low saturation, normal ferritin. Prior colonoscopy 02/06/11 notable for small internal hemorrhoids and sigmoid diverticulosis.   Resolving Hypokalemia: Likely in the context of home diuretic. Supplementing. 2.9-->3.1-->3.2. Will continue kdur supplementation today and as outpatient. Will need repeat BMET to trend K at follow up appointment.  Essential Hypertension: Currently normotensive with some episodes of hypotension last night. Home meds include amlodipine 10 mg, atenolol-chlorthalidone 100-25  milligrams, losartan 100 mg. Held home anti-hypertensives in the hospital and will not resume until follow-up appointment.  Osteoporosis: At her last office visit with Dr. Josem Kaufmann on 05/23/13, she was noted to be nonadherent to her calcium, vitamin D, Fosamax. She is to call back for a time during which she could schedule her DEXA scan. Vitamin D pending.  Elevated Fasting Glucose: Glucose 128 on admission. A1c pending.  Chronic Kidney Disease (CKD), Stage II: Baseline creatinine 0.6 to 0.8. Cr 1.0 on admission, likely in the setting of her acute illness.  Hyperlipidemia: Total cholesterol 129, LDL 79, HDL 36, Triglycerides 70. This panel is improved from 2014.  Discharge Vitals:   BP 100/62 mmHg  Pulse 69  Temp(Src) 98.6 F (37 C) (Oral)  Resp 18  Ht  (1.626 m)  Wt 196 lb 6.9 oz (89.1 kg)  BMI 33.70 kg/m2  SpO2 100%  Discharge Labs:  Results for orders placed or performed during the hospital encounter of 03/15/14 (from the past 24 hour(s))  Basic metabolic panel     Status: Abnormal   Collection Time: 03/16/14  4:59 PM  Result Value Ref Range   Sodium 135 135 - 145 mmol/L   Potassium 3.2 (L) 3.5 - 5.1 mmol/L   Chloride 103 96 - 112 mmol/L   CO2 26 19 - 32 mmol/L   Glucose, Bld 110 (H) 70 - 99 mg/dL   BUN 15 6 - 23 mg/dL   Creatinine, Ser 1.61 0.50 - 1.10 mg/dL   Calcium 9.2 8.4 - 09.6 mg/dL   GFR calc non Af Amer 84 (L) >90 mL/min   GFR calc Af Amer >90 >90 mL/min   Anion gap 6 5 - 15    Signed: Stark Bray, MD 03/16/2014, 1:53 PM    Services Ordered on Discharge: none Equipment Ordered on Discharge: none

## 2014-03-16 NOTE — Progress Notes (Signed)
OT Cancellation Note  Patient Details Name: [name] MRN: [id number] DOB: 03-22-1940   Cancelled Treatment:    Reason Eval/Treat Not Completed: OT screened, no needs identified, will sign off.  Pt is back to baseline and is independent with ADLs.    Angelene GiovanniConarpe, Ryoma Nofziger M  Nuchem Grattan Prior [place], OTR/L [phone]  03/16/2014, 11:27 AM

## 2014-03-16 NOTE — H&P (Signed)
Date: 03/16/2014               Patient Name:  [name] MRN: [id number]  DOB: February 28, 1940 Age / Sex: 74 y.o., female   PCP: Doneen Poisson, MD         Medical Service: Internal Medicine Teaching Service         Attending Physician: Dr. Earl Lagos, MD    First Contact: Dr. Eleonore Chiquito  Pager: [phone]  Second Contact: Dr. Inocente Salles  Pager: [phone]       After Hours (After 5p/  First Contact Pager: [phone]  weekends / holidays): Second Contact Pager: [phone]   Chief Complaint: Dizziness and weakness  History of Present Illness: Miss Grassel is a 74 year old female with hypertension, hyperlipidemia, osteoporosis, CKD Stage 2 presents with dizziness and weakness.  Six days ago, she reports coming home from work and experiencing the acute onset of nausea, nonbloody and nonbilious vomiting, diarrhea, dizziness. 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 Yesterday, she reports as she was driving home from church with her husband, she again noted the onset of nausea and to the point where she needed to come to the hospital. She describes her dizziness as "feeling as though I'm going to topple over" and improves when she lies supine though has never experienced this kind of sensation before. She also reports fatigue out of the ordinary and dark stools and recently started taking a multivitamin for hair, skin and nails but is unsure if it has iron in it. She denies any recent sick contacts, persistent abdominal pain, new food exposure, changes to her medication, syncope, muscle cramps, sneezing, runny nose, headache though reports a prior hysterectomy done some 20 years ago and "feeling as though she is smothered" at night over the past week. She has been vaccinated for the flu this year. In the ED, she was started on IV fluids and repleted with potassium. EKG was notable for  second-degree, type I heart block.  Meds: Current Facility-Administered Medications  Medication Dose Route Frequency Provider Last Rate Last Dose  . 0.9 %  sodium chloride infusion   Intravenous STAT Arby Barrette, MD 75 mL/hr at 03/16/14 0431    . 0.9 %  sodium chloride infusion   Intravenous Continuous Marjan Rabbani, MD      . acetaminophen (TYLENOL) tablet 650 mg  650 mg Oral Q6H PRN Otis Brace, MD       Or  . acetaminophen (TYLENOL) suppository 650 mg  650 mg Rectal Q6H PRN Marjan Rabbani, MD      . calcium-vitamin D (OSCAL WITH D) 500-200 MG-UNIT per tablet 1 tablet  1 tablet Oral Q breakfast Nischal Narendra, MD      . promethazine (PHENERGAN) injection 12.5 mg  12.5 mg Intravenous Q6H PRN Otis Brace, MD        Allergies: Allergies as of 03/15/2014 - Review Complete 03/15/2014  Allergen Reaction Noted  . Ace inhibitors Other (See Comments) 02/10/2009   Past Medical History  Diagnosis Date  . Essential hypertension   . Osteoarthritis     knees and hands  . Systolic murmur     per patient report she has had rheumatic fever in childhood  . Osteoporosis, post-menopausal 2010    left femur T -2.7, left forearm T -3.5  . Blood transfusion without reported diagnosis     1960's, unknown  reason  . Post-menopausal atrophic vaginitis   . Obesity, Class II, BMI 35-39.9   . Anemia 03/16/2014  . Chronic kidney disease (CKD), stage II (mild) 03/16/2014  . Hyperlipidemia 03/16/2014   Past Surgical History  Procedure Laterality Date  . Abdominal hysterectomy      Fibroids   Family History  Problem Relation Age of Onset  . Cancer Mother     Unknown type  . Stroke Father   . Unexplained death Sister   . Other Brother     Unknown health  . Healthy Daughter   . Healthy Son   . Asthma Sister   . Other Sister     Unknown health  . Other Brother     Unknown health  . Other Brother     Unknown health  . Cancer Brother     Unknown type  . Cancer Brother     Unknown  type  . Other Brother     Accident  . Other Brother     Garment/textile technologistndustrial accident  . Healthy Daughter   . Healthy Daughter    History   Social History  . Marital Status: Married    Spouse Name: N/A  . Number of Children: N/A  . Years of Education: N/A   Occupational History  . Not on file.   Social History Main Topics  . Smoking status: Never Smoker   . Smokeless tobacco: Never Used  . Alcohol Use: No  . Drug Use: No  . Sexual Activity: No   Other Topics Concern  . 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    Review of Systems: As noted in the history of present illness  Physical Exam: Blood pressure 97/58, pulse 72, temperature 98 F (36.7 C), temperature source Oral, resp. rate 18, height 5\' 4"  (1.626 m), weight 196 lb 6.9 oz (89.1 kg), SpO2 100 %. General: Elderly African-American female resting in bed, NAD HEENT: PERRL, EOMI, no scleral icterus, dry mucous membranes Cardiac: Systolic ejection murmur best appreciated right upper sternal border Pulm: clear to auscultation bilaterally, no wheezes, rales, or rhonchi Abd: soft, nontender, nondistended, BS present Ext: warm and well perfused, no pedal or tibial edema Neuro: responds to questions appropriately; moving all extremities freely   Lab results: Basic Metabolic Panel:  Recent Labs  09/81/1903/13/16 1859  NA 136  K 2.9*  CL 102  CO2 23  GLUCOSE 128*  BUN 31*  CREATININE 1.00  CALCIUM 9.3   Liver Function Tests:  Recent Labs  03/15/14 2158  AST 23  ALT 21  ALKPHOS 61  BILITOT 0.4  PROT 7.0  ALBUMIN 3.9    Recent Labs  03/15/14 2158  LIPASE 27   CBC:  Recent Labs  03/15/14 1859  WBC 11.7*  HGB 8.9*  HCT 26.4*  MCV 92.6  PLT 216   Cardiac Enzymes:  Recent Labs  03/15/14 2158  TROPONINI <0.03   Coagulation:  Recent Labs  03/15/14 2158    LABPROT 13.5  INR 1.02   Urinalysis:  Recent Labs  03/15/14 2215  COLORURINE YELLOW  LABSPEC 1.012  PHURINE 6.5  GLUCOSEU NEGATIVE  HGBUR NEGATIVE  BILIRUBINUR NEGATIVE  KETONESUR NEGATIVE  PROTEINUR NEGATIVE  UROBILINOGEN 0.2  NITRITE NEGATIVE  LEUKOCYTESUR NEGATIVE     Imaging results:  Dg Chest 2 View  03/16/2014  CLINICAL DATA:  Week and is nauseated.  EXAM: CHEST  2 VIEW  COMPARISON:  07/17/2003  FINDINGS: The heart size and mediastinal contours are within normal limits. Both lungs are clear. The visualized skeletal structures are unremarkable.  IMPRESSION: No active cardiopulmonary disease.   Electronically Signed   By: Ellery Plunk M.D.   On: 03/16/2014 01:56    Other results: EKG: Reviewed and compared with none prior. Prolonged PR interval with dropped beats suggestive of second degree type I heart block  Assessment & Plan by Problem: Dizziness and weakness: Possibilities include vertigo or presyncope. Her abdominal symptoms sound like a viral syndrome should very well precipitate labyrinthitis and thus vertigo-like symptoms. A viral syndrome could also make her profoundly dehydrated, especially in the setting of her home diuretics, and accounts for her hypokalemia on admission though she was not found to be orthostatic in the emergency department. Her heart block appears new given no prior EKGs and could also contribute to her symptoms. EF 65-75% without wall motion abnormalities and mild focal basal septal hypertrophy on echo 04/09/2007. BNP 166 in the ED. Of note, she reports a history of rheumatic fever as a child for which her aortic murmur is a sequela though interestingly enough her echo comments on a possible trileaflet aortic valve. Iron deficiency anemia secondary to colonic bleeding is also suspect given the dark school she reported in the absence of iron supplementation hemoglobin 8.9 with no known prior. Prior colonoscopy 02/06/11 notable for small internal  hemorrhoids and sigmoid diverticulosis. Initial troponin negative which is reassuring for no ACS. Lipase reassuring for no pancreatic process. Chest x-ray reassuring for no acute infiltrate. -Recheck BMET to assess resolution of hypokalemia -Repeat CBC to confirm anemia -Continue NS @ 100cc/hr -Consult PT/OT for gait assessment -Continue telemetry -Recheck FOBT as it has not resulted from when it was collected in the ED -Check anemia panel -Give Tylenol 650 mg as needed every 6 hours for fever -Check TSH -Repeat echo -Check troponin -Check lactate -Check magnesium/phosphorus -Check CK -Continue Phenergan 12.5 mg IV every 6 hours for nausea and vomiting -Advance diet as tolerated  Essential hypertension: Home meds include amlodipine 10 mg, atenolol-chlorthalidone 100-25 milligrams, losartan 100 mg -Hold home medications  Osteoporosis: At her last office visit with Dr. Josem Kaufmann on 05/23/13, she was noted to be nonadherent to her calcium, vitamin D, Fosamax. She was to call back for a time during which she could schedule her DEXA scan. -Check vitamin D -Continue Os-Cal with vitamin D 1 tablet with breakfast  Elevated fasting glucose: Glucose 128 on admission. -Check lipid panel, A1c  Chronic kidney disease (CKD), stage II: Baseline creatinine 0.6 to 0.8 though 1.0 on admission, likely in the setting of her acute illness. -Continue following   Hyperlipidemia: Total cholesterol 164, LDL 106 on lipid panel 04/26/2012. -Recheck lipid panel  #FEN:  -Diet: Clear liquids  #DVT prophylaxis: SCDs  #CODE STATUS: FULL CODE -Defer to daughter Elyn Peers [phone]] if patients lacks decision-making capacity -Confirmed with patient on admission   Dispo: Disposition is deferred at this time, awaiting improvement of current medical problems.   The patient does have a current PCP Doneen Poisson, MD) and does need an [facility] hospital follow-up appointment after discharge.  The patient does  have transportation limitations that hinder transportation to clinic appointments.  Signed: Beather Arbour, MD 03/16/2014, 5:03 AM

## 2014-03-16 NOTE — Progress Notes (Signed)
UR completed 

## 2014-03-17 DIAGNOSIS — K5730 Diverticulosis of large intestine without perforation or abscess without bleeding: Secondary | ICD-10-CM | POA: Diagnosis not present

## 2014-03-17 DIAGNOSIS — K5900 Constipation, unspecified: Secondary | ICD-10-CM | POA: Diagnosis not present

## 2014-03-17 DIAGNOSIS — R634 Abnormal weight loss: Secondary | ICD-10-CM | POA: Diagnosis not present

## 2014-03-17 DIAGNOSIS — K921 Melena: Secondary | ICD-10-CM | POA: Diagnosis not present

## 2014-03-17 DIAGNOSIS — D509 Iron deficiency anemia, unspecified: Secondary | ICD-10-CM | POA: Diagnosis not present

## 2014-03-17 LAB — HEMOGLOBIN A1C
Hgb A1c MFr Bld: 5.6 % (ref 4.8–5.6)
Mean Plasma Glucose: 114 mg/dL

## 2014-03-17 LAB — VITAMIN D 25 HYDROXY (VIT D DEFICIENCY, FRACTURES): VIT D 25 HYDROXY: 25.2 ng/mL — AB (ref 30.0–100.0)

## 2014-03-24 ENCOUNTER — Encounter: Payer: Self-pay | Admitting: Internal Medicine

## 2014-03-24 ENCOUNTER — Ambulatory Visit (INDEPENDENT_AMBULATORY_CARE_PROVIDER_SITE_OTHER): Payer: Medicare Other | Admitting: Internal Medicine

## 2014-03-24 VITALS — BP 148/91 | HR 70 | Temp 97.8°F | Ht 64.0 in | Wt 203.1 lb

## 2014-03-24 DIAGNOSIS — E559 Vitamin D deficiency, unspecified: Secondary | ICD-10-CM

## 2014-03-24 DIAGNOSIS — I10 Essential (primary) hypertension: Secondary | ICD-10-CM

## 2014-03-24 DIAGNOSIS — Z0000 Encounter for general adult medical examination without abnormal findings: Secondary | ICD-10-CM

## 2014-03-24 DIAGNOSIS — D649 Anemia, unspecified: Secondary | ICD-10-CM

## 2014-03-24 DIAGNOSIS — M810 Age-related osteoporosis without current pathological fracture: Secondary | ICD-10-CM | POA: Diagnosis not present

## 2014-03-24 DIAGNOSIS — E876 Hypokalemia: Secondary | ICD-10-CM | POA: Diagnosis not present

## 2014-03-24 MED ORDER — CALCIUM CARBONATE ANTACID 500 MG PO CHEW: 1.0000 | CHEWABLE_TABLET | Freq: Every day | ORAL | Status: DC

## 2014-03-24 MED ORDER — VITAMIN D 50 MCG (2000 UT) PO TABS: 2000.0000 [IU] | ORAL_TABLET | Freq: Every day | ORAL | Status: DC

## 2014-03-24 MED ORDER — AMLODIPINE BESYLATE 10 MG PO TABS: 10.0000 mg | ORAL_TABLET | Freq: Every day | ORAL | Status: DC

## 2014-03-24 NOTE — Progress Notes (Signed)
Patient ID: [name], female   DOB: 10-13-1940, 74 y.o.   MRN: [id number]     Subjective:   Patient ID: [name] female   DOB: May 18, 1940 74 y.o.   MRN: [id number]  HPI: [name] is a 74 y.o. very pleasant woman with past medical history of hypertension, hyperlipidemia, osteoporosis no longer on bisphosphonate therapy, chronic normocytic anemia, vitamin D insufficiency, childhood rheumatic fever, and OA who presents for hospital follow-up visit.   She was hospitalized from 3/13-14 for generalized weakness in setting of volume depletion from continued diuretic use with viral GI illness. She received IVF's with improvement of symptoms. She was also found to have dark stools (FOBT positive) with hemoglobin of 8.5-8.9 with no prior Hg for baseline. Anemia panel revealed low normal ferritin of 41. She is not on iron supplementation. She has had colonoscopy done in 2013 that revealed sigmoid diverticulosis and small internal hemorrhoids. She has never had EGD before and is scheduled to have one done on 3/24. She is on chronic NSAID therapy (voltarn 50 mg BID) for osteoarthritis. She reports her dark stools are normalizing.   She was also found to have Mobitz Type 1 (no prior EKG for comparison) later repeated to show 1st degree AV block. She was discontinued on atenolol in addition to her other anti-hypertensives (amlodipine, losartan, and chlorthalidone) in setting of normotension. She reports her energy is coming back since discharge and no longer feels weak. She denies lightheadedness or syncope.   She was found to have hypokalemia (normal magnesium levels) and started on  kdur 20 mEq daily which she is compliant with. She denies muscle cramping, chest pain, or palpitations.    Her vitamin D level was mildly low at 25.2. She is currently on 500 U BID of vitamin D as well as caltrate. She had DEXA scan in 2010 that revealed osteoporosis and was on bisphosphonate therapy which she is no  longer taking. She denies history of fracture or recent fall.     Past Medical History  Diagnosis Date  . Essential hypertension   . Osteoarthritis     knees and hands  . Systolic murmur     per patient report she has had rheumatic fever in childhood  . Osteoporosis, post-menopausal 2010    left femur T -2.7, left forearm T -3.5  . Blood transfusion without reported diagnosis     1960's, unknown reason  . Post-menopausal atrophic vaginitis   . Obesity, Class II, BMI 35-39.9   . Anemia 03/16/2014  . Chronic kidney disease (CKD), stage II (mild) 03/16/2014  . Hyperlipidemia 03/16/2014   Current Outpatient Prescriptions  Medication Sig Dispense Refill  . aspirin 81 MG tablet Take 81 mg by mouth daily.      . Calcium Carbonate-Vitamin D (CALTRATE 600+D) 600-400 MG-UNIT per tablet Take 1 tablet by mouth daily. 90 tablet 3  . Cholecalciferol (VITAMIN D PO) Take 500 Units by mouth 2 (two) times daily.    . diclofenac (VOLTAREN) 50 MG EC tablet Take 1 tablet (50 mg total) by mouth 2 (two) times daily. 180 tablet 3  . potassium chloride (K-DUR) 10 MEQ tablet Take 2 tablets (20 mEq total) by mouth daily. 30 tablet 0  . predniSONE (DELTASONE) 20 MG tablet Take 2 tablets (40 mg total) by mouth daily. 10 tablet 0  . traMADol (ULTRAM) 50 MG tablet Take 50 mg by mouth every 6 (six) hours as needed for moderate pain or severe pain (pain).  No current facility-administered medications for this visit.   Family History  Problem Relation Age of Onset  . Cancer Mother     Unknown type  . Stroke Father   . Unexplained death Sister   . Other Brother     Unknown health  . Healthy Daughter   . Healthy Son   . Asthma Sister   . Other Sister     Unknown health  . Other Brother     Unknown health  . Other Brother     Unknown health  . Cancer Brother     Unknown type  . Cancer Brother     Unknown type  . Other Brother     Accident  . Other Brother     Garment/textile technologist  . Healthy  Daughter   . Healthy Daughter    History   Social History  . Marital Status: Married    Spouse Name: N/A  . Number of Children: N/A  . Years of Education: N/A   Social History Main Topics  . Smoking status: Never Smoker   . Smokeless tobacco: Never Used  . Alcohol Use: No  . Drug Use: No  . Sexual Activity: No   Other Topics Concern  . 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   Review of Systems: Review of Systems  Constitutional: Negative for fever, chills and malaise/fatigue.  HENT: Negative for congestion.        Ear itching  Eyes: Negative for blurred vision.  Respiratory: Positive for cough. Negative for shortness of breath and wheezing.   Cardiovascular: Negative for chest pain, palpitations and leg swelling.  Gastrointestinal: Positive for melena (improving ). Negative for nausea, vomiting, abdominal pain, diarrhea, constipation and blood in stool.  Genitourinary: Negative for dysuria, urgency and frequency.  Musculoskeletal: Negative for falls.  Neurological: Negative for dizziness, weakness and headaches.    Objective:  Physical Exam: Filed Vitals:   03/24/14 1110  BP: 148/91  Pulse: 70  Temp: 97.8 F (36.6 C)  TempSrc: Oral  Height:  (1.626 m)  Weight: 203 lb 1.6 oz (92.126 kg)  SpO2: 99%    Physical Exam  Constitutional: She is oriented to person, place, and time. She appears well-developed and well-nourished. No distress.  HENT:  Head: Normocephalic and atraumatic.  Right Ear: External ear normal.  Left Ear: External ear normal.  Nose: Nose normal.  Mouth/Throat: Oropharynx is clear and moist. No oropharyngeal exudate.  Eyes: Conjunctivae and EOM are normal. Pupils are equal, round, and reactive to light. Right eye exhibits no discharge. Left eye exhibits no discharge. No scleral icterus.  Neck: Normal range  of motion. Neck supple.  Cardiovascular: Normal rate and regular rhythm.   Murmur heard. Pulmonary/Chest: Effort normal and breath sounds normal. No respiratory distress. She has no wheezes. She has no rales.  Abdominal: Soft. Bowel sounds are normal. She exhibits no distension. There is no tenderness. There is no rebound and no guarding.  Musculoskeletal: Normal range of motion. She exhibits edema (trace b/l LE). She exhibits no tenderness.  Neurological: She is alert and oriented to person, place, and time.  Skin: Skin is warm and dry. No rash noted. She is not diaphoretic. No erythema. No pallor.  Psychiatric: She has a normal mood and affect. Her behavior is normal. Judgment and thought content normal.  Assessment & Plan:   Please see problem list for problem-based assessment and plan

## 2014-03-24 NOTE — Patient Instructions (Signed)
-  Start taking amlodipine 10 mg daily for high blood pressure -Start taking tums daily and vitamin D 2000 U daily  -Will check your bloodwork today and call you with the results -Please come back in 1 month -Glad you are doing better!  General Instructions:   Thank you for bringing your medicines today. This helps us keep you safe from mistakes.   Progress Toward Treatment Goals:  Treatment Goal 05/23/2013  Blood pressure at goal    Self Care Goals & Plans:  Self Care Goal 05/23/2013  Manage my medications take my medicines as prescribed; bring my medications to every visit; refill my medications on time  Monitor my health keep track of my blood pressure  Eat healthy foods eat foods that are low in salt; eat baked foods instead of fried foods  Be physically active find an activity I enjoy    No flowsheet data found.   Care Management & Community Referrals:  Referral 05/23/2013  Referrals made for care management support none needed  Referrals made to community resources none

## 2014-03-25 ENCOUNTER — Other Ambulatory Visit: Payer: Self-pay | Admitting: Gastroenterology

## 2014-03-25 DIAGNOSIS — E559 Vitamin D deficiency, unspecified: Secondary | ICD-10-CM | POA: Insufficient documentation

## 2014-03-25 DIAGNOSIS — K921 Melena: Secondary | ICD-10-CM | POA: Diagnosis not present

## 2014-03-25 DIAGNOSIS — R1310 Dysphagia, unspecified: Secondary | ICD-10-CM | POA: Diagnosis not present

## 2014-03-25 DIAGNOSIS — R634 Abnormal weight loss: Secondary | ICD-10-CM | POA: Diagnosis not present

## 2014-03-25 DIAGNOSIS — D509 Iron deficiency anemia, unspecified: Secondary | ICD-10-CM | POA: Diagnosis not present

## 2014-03-25 DIAGNOSIS — K222 Esophageal obstruction: Secondary | ICD-10-CM

## 2014-03-25 LAB — CBC
HCT: 28.4 % — ABNORMAL LOW (ref 36.0–46.0)
Hemoglobin: 9.1 g/dL — ABNORMAL LOW (ref 12.0–15.0)
MCH: 29.1 pg (ref 26.0–34.0)
MCHC: 32 g/dL (ref 30.0–36.0)
MCV: 90.7 fL (ref 78.0–100.0)
MPV: 10 fL (ref 8.6–12.4)
Platelets: 364 10*3/uL (ref 150–400)
RBC: 3.13 MIL/uL — ABNORMAL LOW (ref 3.87–5.11)
RDW: 15 % (ref 11.5–15.5)
WBC: 6.7 10*3/uL (ref 4.0–10.5)

## 2014-03-25 LAB — BASIC METABOLIC PANEL WITH GFR
BUN: 9 mg/dL (ref 6–23)
CHLORIDE: 104 meq/L (ref 96–112)
CO2: 24 meq/L (ref 19–32)
Calcium: 9.1 mg/dL (ref 8.4–10.5)
Creat: 0.62 mg/dL (ref 0.50–1.10)
GFR, Est African American: >89 mL/min
GFR, Est Non African American: >89 mL/min
GLUCOSE: 88 mg/dL (ref 70–99)
POTASSIUM: 4.7 meq/L (ref 3.5–5.3)
SODIUM: 136 meq/L (ref 135–145)

## 2014-03-25 NOTE — Assessment & Plan Note (Signed)
Pt declined screening mammogram and influenza, zoster, and pneumococcal vaccinations at this time, inquire again at next visit.

## 2014-03-25 NOTE — Assessment & Plan Note (Addendum)
Assessment: Pt with well-controlled hypertension previously on four-class anti-hypertensive therapy who presents with blood pressure of 148/91.   Plan:   -BP 148/91 not at goal <140/90 -Start amlodipine 10 mg daily -Avoid BB in setting of heart block -Obtain BMP ---> normal  -Pt to return in 1 month for blood pressure recheck

## 2014-03-25 NOTE — Assessment & Plan Note (Addendum)
Assessment: Pt is s/p hysterectomy with chronic normocytic anemia with last colonoscopy with sigmoid diverticulosis and internal hemorrhoids in 2013 with no prior EGD and last anemia panel on 3/14 with ferritin of 41 on chronic NSAID therapy with recent dark stools and FOBT positive concerning for NSAID-induced ulcer who presents with no active bleeding or hemodynamic instability.   Plan:  -Obtain CBC ----> Hg improved to 9.1 from 8.5 (unclear baseline) -Pt to have EGD on 3/24 to assess for source of blood loss, if ulceration present will need to discontinue NSAIDs    -Consider iron supplementation at next visit due to low-normal ferritin level

## 2014-03-25 NOTE — Assessment & Plan Note (Addendum)
Assessment: Pt with 25-OH vitamin D level of 25.2 on 03/16/14 compliant with vitamin D supplementation who presents with no recent fall or fracture.   Plan:  -Increase vitamin D from 500 U BID to 2000 U daily and discontinue caltrate

## 2014-03-25 NOTE — Assessment & Plan Note (Addendum)
Assessment: Pt with hypokalemia during recent hospitalization most likely due to GI illness and diuretic therapy compliant with newly started potassium supplementation found to have normal potassium level.   Plan:  -Obtain BMP ---> K normal at 4.7 -Continue kdur 20 mEq daily  -Pt to return in 1 month for repeat BMP

## 2014-03-25 NOTE — Assessment & Plan Note (Signed)
Assessment: Pt with evidence of osteoporosis on DEXA scan in 2010 noncompliant with bisphosphonate therapy who presents with no recent fall or fracture.   Plan:  -Discontinue caltrate and prescribe calcium carbonate 500 mg daily and vitamin D 2000 U daily -Pt to think about having repeat DEXA scan at next visit

## 2014-03-25 NOTE — Progress Notes (Signed)
Case discussed with Dr. Rabbani soon after the resident saw the patient.  We reviewed the resident's history and exam and pertinent patient test results.  I agree with the assessment, diagnosis, and plan of care documented in the resident's note. 

## 2014-03-26 ENCOUNTER — Ambulatory Visit (HOSPITAL_COMMUNITY)
Admission: RE | Admit: 2014-03-26 | Discharge: 2014-03-26 | Disposition: A | Discharge status: Home / Self Care | Payer: Medicare Other | Source: Ambulatory Visit | Attending: Gastroenterology | Admitting: Gastroenterology

## 2014-03-26 DIAGNOSIS — K449 Diaphragmatic hernia without obstruction or gangrene: Secondary | ICD-10-CM | POA: Insufficient documentation

## 2014-03-26 DIAGNOSIS — K222 Esophageal obstruction: Secondary | ICD-10-CM

## 2014-03-29 ENCOUNTER — Telehealth: Payer: Self-pay | Admitting: Internal Medicine

## 2014-03-29 ENCOUNTER — Other Ambulatory Visit: Payer: Self-pay | Admitting: Internal Medicine

## 2014-03-29 DIAGNOSIS — E876 Hypokalemia: Secondary | ICD-10-CM

## 2014-03-29 MED ORDER — GUAIFENESIN 100 MG/5ML PO SOLN: 5.0000 mL | ORAL | Status: DC | PRN

## 2014-03-29 MED ORDER — ONDANSETRON HCL 4 MG PO TABS: 4.0000 mg | ORAL_TABLET | Freq: Three times a day (TID) | ORAL | Status: DC | PRN

## 2014-03-29 NOTE — Telephone Encounter (Signed)
   Reason for call:   I received a call from Ms. [name]'s daughter at 4pm PM indicating that her mother was coughing and gagging. Pt has had this problem for a while and she is wondering if there is anything that can be done. She is presently been evaluated by Dr Loreta AveMann for this and as an up coming appointment with ENT.  She denies fever, chills, diarrhea and abdominal pain. She is otheriwse in her normal self.  Pt has tried dextromethophan for cough.   Pertinent Data:   EGD- 03/25/2014- could not be completed, as pt was found to have a stricture/narrowing per family and suggested by indications for subsequent requested- Barium swallow- done- 03/26/2014. Pt was therefore referred to ENT. She has not gotten an appointment yet with ENT, but this should be resolved on Monday.  QTc- 450.   Assessment / Plan / Recommendations:   Will call in Zofran 4mg  Q6H and Cough syrup- Guaifenasin- 5mls Q8H.   Patient advised if worsening symptoms should go to the ED, or let us know and come to the clinic.    Abigail BoerEjiroghene E Cumi Sanagustin, MD   03/29/2014, 4:07 PM

## 2014-03-30 NOTE — Telephone Encounter (Signed)
Last K level on KCl 20 mEq was 4.7.  Initially hypokalemic secondary to GI illness with diarrhea.  Will lower KCl to 10 mEq daily and reassess K level at the follow-up visit on the lower potassium dose now that GI illness has resolved.

## 2014-03-31 DIAGNOSIS — R1314 Dysphagia, pharyngoesophageal phase: Secondary | ICD-10-CM | POA: Diagnosis not present

## 2014-03-31 DIAGNOSIS — K220 Achalasia of cardia: Secondary | ICD-10-CM | POA: Diagnosis not present

## 2014-04-03 ENCOUNTER — Other Ambulatory Visit: Payer: Self-pay | Admitting: Otolaryngology

## 2014-04-08 ENCOUNTER — Encounter: Payer: Self-pay | Admitting: Internal Medicine

## 2014-04-08 DIAGNOSIS — K220 Achalasia of cardia: Secondary | ICD-10-CM

## 2014-04-08 HISTORY — DX: Achalasia of cardia: K22.0

## 2014-04-13 ENCOUNTER — Encounter (HOSPITAL_COMMUNITY): Payer: Self-pay | Admitting: Nurse Practitioner

## 2014-04-13 ENCOUNTER — Ambulatory Visit (HOSPITAL_COMMUNITY)
Admission: RE | Admit: 2014-04-13 | Discharge: 2014-04-13 | Disposition: A | Discharge status: Home / Self Care | Payer: Medicare Other | Source: Ambulatory Visit | Attending: Nurse Practitioner | Admitting: Nurse Practitioner

## 2014-04-13 ENCOUNTER — Encounter (HOSPITAL_COMMUNITY)
Admission: RE | Admit: 2014-04-13 | Discharge: 2014-04-13 | Disposition: A | Discharge status: Home / Self Care | Payer: Medicare Other | Source: Ambulatory Visit | Attending: Otolaryngology | Admitting: Otolaryngology

## 2014-04-13 ENCOUNTER — Other Ambulatory Visit (HOSPITAL_COMMUNITY): Payer: Self-pay | Admitting: Nurse Practitioner

## 2014-04-13 ENCOUNTER — Ambulatory Visit (HOSPITAL_BASED_OUTPATIENT_CLINIC_OR_DEPARTMENT_OTHER)
Admission: RE | Admit: 2014-04-13 | Discharge: 2014-04-13 | Disposition: A | Discharge status: Home / Self Care | Payer: Medicare Other | Source: Ambulatory Visit | Attending: Nurse Practitioner | Admitting: Nurse Practitioner

## 2014-04-13 ENCOUNTER — Encounter (HOSPITAL_COMMUNITY): Payer: Self-pay

## 2014-04-13 DIAGNOSIS — Z01812 Encounter for preprocedural laboratory examination: Secondary | ICD-10-CM

## 2014-04-13 DIAGNOSIS — Z79891 Long term (current) use of opiate analgesic: Secondary | ICD-10-CM | POA: Diagnosis not present

## 2014-04-13 DIAGNOSIS — N182 Chronic kidney disease, stage 2 (mild): Secondary | ICD-10-CM | POA: Insufficient documentation

## 2014-04-13 DIAGNOSIS — R9431 Abnormal electrocardiogram [ECG] [EKG]: Secondary | ICD-10-CM

## 2014-04-13 DIAGNOSIS — M810 Age-related osteoporosis without current pathological fracture: Secondary | ICD-10-CM | POA: Diagnosis not present

## 2014-04-13 DIAGNOSIS — R1310 Dysphagia, unspecified: Secondary | ICD-10-CM

## 2014-04-13 DIAGNOSIS — I340 Nonrheumatic mitral (valve) insufficiency: Secondary | ICD-10-CM

## 2014-04-13 DIAGNOSIS — Z01818 Encounter for other preprocedural examination: Secondary | ICD-10-CM | POA: Insufficient documentation

## 2014-04-13 DIAGNOSIS — Z6834 Body mass index (BMI) 34.0-34.9, adult: Secondary | ICD-10-CM | POA: Insufficient documentation

## 2014-04-13 DIAGNOSIS — I4891 Unspecified atrial fibrillation: Secondary | ICD-10-CM | POA: Diagnosis not present

## 2014-04-13 DIAGNOSIS — I129 Hypertensive chronic kidney disease with stage 1 through stage 4 chronic kidney disease, or unspecified chronic kidney disease: Secondary | ICD-10-CM | POA: Insufficient documentation

## 2014-04-13 DIAGNOSIS — M19041 Primary osteoarthritis, right hand: Secondary | ICD-10-CM | POA: Diagnosis not present

## 2014-04-13 DIAGNOSIS — M19042 Primary osteoarthritis, left hand: Secondary | ICD-10-CM | POA: Diagnosis not present

## 2014-04-13 DIAGNOSIS — I4892 Unspecified atrial flutter: Secondary | ICD-10-CM | POA: Diagnosis not present

## 2014-04-13 DIAGNOSIS — R0600 Dyspnea, unspecified: Secondary | ICD-10-CM | POA: Diagnosis not present

## 2014-04-13 DIAGNOSIS — K449 Diaphragmatic hernia without obstruction or gangrene: Secondary | ICD-10-CM | POA: Insufficient documentation

## 2014-04-13 DIAGNOSIS — Z6833 Body mass index (BMI) 33.0-33.9, adult: Secondary | ICD-10-CM | POA: Diagnosis not present

## 2014-04-13 DIAGNOSIS — D649 Anemia, unspecified: Secondary | ICD-10-CM

## 2014-04-13 DIAGNOSIS — Z79899 Other long term (current) drug therapy: Secondary | ICD-10-CM | POA: Insufficient documentation

## 2014-04-13 DIAGNOSIS — Z791 Long term (current) use of non-steroidal anti-inflammatories (NSAID): Secondary | ICD-10-CM | POA: Diagnosis not present

## 2014-04-13 DIAGNOSIS — I350 Nonrheumatic aortic (valve) stenosis: Secondary | ICD-10-CM

## 2014-04-13 DIAGNOSIS — E669 Obesity, unspecified: Secondary | ICD-10-CM

## 2014-04-13 DIAGNOSIS — Z7982 Long term (current) use of aspirin: Secondary | ICD-10-CM | POA: Diagnosis not present

## 2014-04-13 DIAGNOSIS — M170 Bilateral primary osteoarthritis of knee: Secondary | ICD-10-CM | POA: Diagnosis not present

## 2014-04-13 DIAGNOSIS — K222 Esophageal obstruction: Secondary | ICD-10-CM | POA: Diagnosis not present

## 2014-04-13 DIAGNOSIS — K220 Achalasia of cardia: Secondary | ICD-10-CM | POA: Diagnosis not present

## 2014-04-13 DIAGNOSIS — I071 Rheumatic tricuspid insufficiency: Secondary | ICD-10-CM

## 2014-04-13 LAB — CBC
HEMATOCRIT: 32.9 % — AB (ref 36.0–46.0)
Hemoglobin: 10 g/dL — ABNORMAL LOW (ref 12.0–15.0)
MCH: 25.6 pg — ABNORMAL LOW (ref 26.0–34.0)
MCHC: 30.4 g/dL (ref 30.0–36.0)
MCV: 84.1 fL (ref 78.0–100.0)
Platelets: 387 10*3/uL (ref 150–400)
RBC: 3.91 MIL/uL (ref 3.87–5.11)
RDW: 15.9 % — ABNORMAL HIGH (ref 11.5–15.5)
WBC: 8.8 10*3/uL (ref 4.0–10.5)

## 2014-04-13 LAB — BASIC METABOLIC PANEL
Anion gap: 13 (ref 5–15)
BUN: 10 mg/dL (ref 6–23)
CALCIUM: 9.3 mg/dL (ref 8.4–10.5)
CO2: 22 mmol/L (ref 19–32)
Chloride: 104 mmol/L (ref 96–112)
Creatinine, Ser: 0.66 mg/dL (ref 0.50–1.10)
GFR calc Af Amer: >90 mL/min (ref >90)
GFR, EST NON AFRICAN AMERICAN: 86 mL/min — AB (ref >90)
GLUCOSE: 101 mg/dL — AB (ref 70–99)
Potassium: 3.5 mmol/L (ref 3.5–5.1)
Sodium: 139 mmol/L (ref 135–145)

## 2014-04-13 MED ORDER — DEXAMETHASONE SODIUM PHOSPHATE 10 MG/ML IJ SOLN
10.0000 mg | Freq: Once | INTRAMUSCULAR | Status: AC
  Administered 2014-04-14: 10 mg via INTRAVENOUS
  Filled 2014-04-13: qty 1

## 2014-04-13 MED ORDER — CEFAZOLIN SODIUM-DEXTROSE 2-3 GM-% IV SOLR
2.0000 g | INTRAVENOUS | Status: AC
  Administered 2014-04-14: 2 g via INTRAVENOUS
  Filled 2014-04-13: qty 50

## 2014-04-13 NOTE — Progress Notes (Signed)
Patient ID: [name], female   DOB: May 30, 1940, 74 y.o.   MRN: [id number]  Primary Care Physician: Rocco SereneKLIMA, LAWRENCE D, MD Referring Physician: Pre op    Abigail HeritageLizzie M [name] is a 74 y.o. female with a h/o HTN, normoocytic anemia, hypokalemia,CKD(Stage 11), that is being seen at the request of pre-op unit for aflutter on EKG,rate controlled, presumed to be new onset from which she is asypmtomatic. She gives a history of hospitalization of one month ago for dehydration/volume depletion/hypotension, with AV dissociation and Kt of 2.9, replaced and d/c EKG showing SR with first degree AV block. She was on 100 mg atenolol with 25 mg chlorthalidone, which was d/ced. Norvasc 10 mg continues with BP acceptable today. She also has lost 36 lbs over the last few months due to dysphagia and is pending rigid endoscopy with dilatation and botox injection for tomorrow. She had been on baby asa but has been stopped for pending surgery. Chadsvasc score of 3(HTN, AGE,FEMALE) She also has a h/o rheumatic heart disease at age 74 with murmur.  Does not remember having an echo. Appears that she was to f/u with echo from last hospitalization but  was never performed for unclear reasons.  Today, she denies symptoms of palpitations, exertional chest pain, shortness of breath, orthopnea, PND, lower extremity edema, dizziness, presyncope, syncope, or neurologic sequela. The patient is tolerating medications without difficulties and is otherwise without complaint today.   Past Medical History  Diagnosis Date  . Essential hypertension   . Osteoarthritis     knees and hands  . Systolic murmur     per patient report she has had rheumatic fever in childhood  . Osteoporosis, post-menopausal 2010    left femur T -2.7, left forearm T -3.5  . Blood transfusion without reported diagnosis     1960's, unknown reason  . Post-menopausal atrophic vaginitis   . Obesity, Class II, BMI 35-39.9   . Anemia 03/16/2014  . Chronic  kidney disease (CKD), stage II (mild) 03/16/2014  . Hyperlipidemia 03/16/2014  . Cricopharyngeal achalasia 04/08/2014    Rigid esophagoscopy, dilation, and Botox injection into cricopharyngeus muscle recommended   . Shortness of breath dyspnea   . Wears glasses   . Cricopharyngeal achalasia   . History of hiatal hernia   . Pneumonia   . Rheumatic fever   . HOH (hard of hearing)    Past Surgical History  Procedure Laterality Date  . Abdominal hysterectomy      Fibroids  . Cyst excision      on hand  . Colonoscopy w/ biopsies and polypectomy    . Multiple tooth extractions      Current Outpatient Prescriptions  Medication Sig Dispense Refill  . amLODipine (NORVASC) 10 MG tablet Take 1 tablet (10 mg total) by mouth daily. 30 tablet 5  . aspirin 81 MG tablet Take 81 mg by mouth daily.      . calcium carbonate (TUMS) 500 MG chewable tablet Chew 1 tablet (200 mg of elemental calcium total) by mouth daily.    . Cholecalciferol (VITAMIN D) 2000 UNITS tablet Take 1 tablet (2,000 Units total) by mouth daily. 30 tablet 5  . diclofenac (VOLTAREN) 50 MG EC tablet Take 1 tablet (50 mg total) by mouth 2 (two) times daily. 180 tablet 3  . guaiFENesin (ROBITUSSIN) 100 MG/5ML SOLN Take 5 mLs (100 mg total) by mouth every 4 (four) hours as needed for cough or to loosen phlegm. 118 mL 0  .  potassium chloride (K-DUR) 10 MEQ tablet Take 1 tablet (10 mEq total) by mouth daily. Please not dose change to 1 tablet daily 30 tablet 5  . traMADol (ULTRAM) 50 MG tablet Take 50 mg by mouth every 6 (six) hours as needed for moderate pain or severe pain (pain).     . ondansetron (ZOFRAN) 4 MG tablet Take 1 tablet (4 mg total) by mouth every 8 (eight) hours as needed for nausea or vomiting. (Patient not taking: Reported on 04/13/2014) 20 tablet 0   No current facility-administered medications for this encounter.   Facility-Administered Medications Ordered in Other Encounters  Medication Dose Route Frequency Provider  Last Rate Last Dose  . [START ON 04/14/2014] ceFAZolin (ANCEF) IVPB 2 g/50 mL premix  2 g Intravenous On Call to OR Osborn Coho, MD      . dexamethasone (DECADRON) injection 10 mg  10 mg Intravenous Once Osborn Coho, MD        Allergies  Allergen Reactions  . Ace Inhibitors Other (See Comments)    cough    History   Social History  . Marital Status: Married    Spouse Name: N/A  . Number of Children: N/A  . Years of Education: N/A   Occupational History  . Not on file.   Social History Main Topics  . Smoking status: Never Smoker   . Smokeless tobacco: Never Used  . Alcohol Use: No  . Drug Use: No  . Sexual Activity: No   Other Topics Concern  . 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    Family History  Problem Relation Age of Onset  . Cancer Mother     Unknown type  . Stroke Father   . Unexplained death Sister   . Other Brother     Unknown health  . Healthy Daughter   . Healthy Son   . Asthma Sister   . Other Sister     Unknown health  . Other Brother     Unknown health  . Other Brother     Unknown health  . Cancer Brother     Unknown type  . Cancer Brother     Unknown type  . Other Brother     Accident  . Other Brother     Garment/textile technologist  . Healthy Daughter   . Healthy Daughter     ROS- All systems are reviewed and negative except as per the HPI above  Physical Exam: There were no vitals filed for this visit.  GEN- The patient is well appearing, alert and oriented x 3 today.   Head- normocephalic, atraumatic Eyes-  Sclera clear, conjunctiva pink Ears- hearing intact Oropharynx- clear Neck- supple, no JVP Lymph- no cervical lymphadenopathy Lungs- Clear to ausculation bilaterally, normal work of breathing Heart- Mostly regular rate and rhythm, 2-3/6 systolic murmur, rubs or gallops, PMI not  laterally displaced GI- soft, NT, ND, + BS Extremities- no clubbing, cyanosis, or edema MS- no significant deformity or atrophy Skin- no rash or lesion Psych- euthymic mood, full affect Neuro- strength and sensation are intact  EKG- Atrial flutter with variable AV block, 60 beats per minute, nonspecific T wave abnormality. Echo( performed today)-  Left ventricle: The cavity size was normal. Wall thickness was increased in a pattern of mild LVH. There was mild focal basal hypertrophy  of the septum. Systolic function was normal. The estimated ejection fraction was in the range of 55% to 60%. Wall motion was normal; there were no regional wall motion abnormalities. - Aortic valve: There was mild stenosis. - Mitral valve: Calcified annulus. Mildly thickened leaflets . There was moderate to severe regurgitation. - Left atrium: The atrium was severely dilated. - Atrial septum: There was a patent foramen ovale. - Pulmonary arteries: Systolic pressure was moderately to severely increased. PA peak pressure: 56 mm Hg (S).  Impressions:  - Normal LV function; severe LAE; mild AS; moderate to severe MR; mild TR; moderate to severe elevation in pulmonary pressure.    Labs:  04/13/14 10.0 (L) 9.1 (L) 8.5 (L) 8.9 (L)    HCT 36.0 - 46.0 % 32.9 (       Creat 0.66, BUN 10, Kt 3.5, Na 139                                                                                     Assessment and Plan-  1.New onset aflutter Asymptomatic Rate controlled Echo reveals mod-severe mitral value insufficiency, mild AS, Normal EF, Pulmonary HTN. Patent foramen ovale Spoke to Dr. Johney Frame re proceeding to surgery, he thinks she  is thought to be moderate risk but for a low risk procedure and can continue with plans for endoscopy tomorrow. Will need to f/u with cardiology for above structural changes with new aflutter with Chadsvasc of 3, after surgery, for consideration for  Anticoagulation. Will  schedule with Dr. Jens Som. Off asa now pending procedure.  2. Murmur/ h/o rheumatic heart disease See above echo report  3. HTN Stable on amlodipine  4. Anemia  Appears improved at 10.0 compared to previous labs.  4. H/o AV dissociation, one month ago, associated with hypokalemia and BB use. Avoid BB use, Kt currently 3.5

## 2014-04-13 NOTE — Progress Notes (Signed)
  Echocardiogram 2D Echocardiogram has been performed.  Tanyiah Laurich FRANCES 04/13/2014, 3:15 PM

## 2014-04-13 NOTE — Progress Notes (Addendum)
Anesthesia Pre-operative Evaluation: Patient is a 74 year old female scheduled for rigid esophagoscopy with dilitation and Botox injection tomorrow with Dr. Annalee GentaShoemaker. DX: Cricopharyngeal achalasia.   Other history includes non-smoker, HTN, rheumatic fever with history of murmur, CKD stage II, HLD, hiatal hernia, SOB, anemia, hysterectomy. BMI is consistent with obesity. She was hospitalized 03/15/14 for N/V/D and dizziness.  Presenting EKG also was concerning for third degree heart block then Wenckebach with labs showed hypokalemia with a K of 2.9, BUN was elevated at 31, Cr normal at 1.00. Troponin was negative.  She was treated with IVF, and discharged home on 3/141/6 with plans for follow-up EKG at her follow-up visit and determine if out-patient cardiology referral was felt warranted at that time. EKG on the day of discharge showed SR with first degree AVB (PR 310 ms), LVH, non-specific ST/T changes.  PCP is listed as Dr. Jess BartersKilma Lawrence with [facility]. Out-patient follow-up with GI Dr. Loreta AveMann was recommended due to anemia with FOBT + in ED.  By notes, colonoscopy on 02/06/11 showed small internal hemorrhoids and sigmoid diverticulosis.  PAT VITALS: BP 137/74, HR 73, RR 20, T 36.8, O2 sat 100%.  Meds include amlodipine, ASA, Tums, Vitamin D, diclofenac, Robitussin, Zofran, Kdur, tramadol. (Two of her antihypertensive medications were held following her 03/2014 admission due to hypotension, possibly related to dehydration.  She was previously on atenolol-chlorthalidone 100-25mg  daily and losartan 100mg  based on 10/2013 records. ASA was also recently discontinued due to anemia.)  04/09/07 Echo: SUMMARY - Overall left ventricular systolic function was vigorous. Left    ventricular ejection fraction was estimated , range being 65    % to 75 %. There was no diagnostic evidence of left    ventricular regional wall motion abnormalities. Left    ventricular wall thickness was mildly  increased. There was    mild focal basal septal hypertrophy. Doppler parameters were    consistent with high left ventricular filling pressure. - Aortic valve thickness was mildly increased. There was mildly    reduced aortic valve leaflet excursion. The mean transaortic    valve gradient was 7 mmHg. Estimated aortic valve area (by    VTI) was 1.94 cm^2. Estimated aortic valve area (by Vmax) was    1.87 cm^2. - The left atrium was mildly dilated.  03/16/14 CXR: No acute cardiopulmonary process.  03/26/14 Esophogram/Barium Swallow:  IMPRESSION: 1. Small hiatal hernia with nonobstructive ring. 2. The patient's choking and gagging symptoms were reproduced when she tried to swallow a barium pill, which lodged in the pharynx. A prominent cricopharyngeal bar was noted, and may be related.  Today's BMET and CBC noted.  BUN/Cr 10/0.66. Glucose 101.  H/H up to 10.0/32.0.  TSH was normal on 03/16/14.  Patient presents with two of her daughters.  She is a very pleasant black female in NAD.  She reports she is feeling well since her hospital discharge.  No more N/V/D or dizziness.  Denies history of chest pain or pre-syncope/syncope.  One night last week she awoke feeling SOB, but otherwise denied.  She is able to sleep flat on one pillow. Her weight has been stable.  She gets mild intermittent BLE edema.  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  Her job includes caring for a mentally and physically disabled 74 year old female.  His stature is 3-4 feet tall. She has to help with transfers, bathing, etc.  She does not get chest pain, prolonged SOB, or  pre-syncope with this level of activity.  She has a known murmur since childhood, but is not followed by a cardiologist. She thinks her last echo was in 2009. She denied known history of afib/flutter.  On exam she has no conversational dyspnea.  Heart RRR.  She does have a grade II-III/VI SEM.  Lungs clear.  Trace pedal  edema.  I have updated anesthesiologist Dr. Krista Blue.  Plan for cardiology pre-operative evaluation due to new onset aflutter and recent history of Wenckebach and possibly 3rd degree heart block in the setting of dehydration and hypopkalemia. Would anticipate she will be getting an echo in the near future, so that will help with re-assessing for valvular heart disease.  I called [facility], and they were able to work patient in this morning.  Mandy at Dr. Thurmon Fair office and patient and her daughters notified that surgery may need to be delayed depending of cardiology recommendations.  We will await formal cardiology recommendations.  Velna Ochs [facility] Short Stay Center/Anesthesiology Phone [phone] 04/13/2014 11:12 AM  Addendum: 04/13/14 Echo: - Left ventricle: The cavity size was normal. Wall thickness was increased in a pattern of mild LVH. There was mild focal basal hypertrophy of the septum. Systolic function was normal. The estimated ejection fraction was in the range of 55% to 60%. Wall motion was normal; there were no regional wall motion abnormalities. - Aortic valve: There was mild stenosis. - Mitral valve: Calcified annulus. Mildly thickened leaflets . There was moderate to severe regurgitation. - Left atrium: The atrium was severely dilated. - Atrial septum: There was a patent foramen ovale. - Pulmonary arteries: Systolic pressure was moderately to severely increased. PA peak pressure: 56 mm Hg (S).  I received a phone call from Rudi Coco, NP. She has evaluated patient and reviewed surgery plans and echo findings with Dr. Johney Frame. Her note is still pending, but by her report, Dr. Johney Frame has cleared patient with low to moderate risk for this procedure. She is in rate controlled aflutter and is without significant CV symptoms. Her intermittent heart block last month was attributed to illness, hypokalemia, and high dose beta-blocker. She  did recommend to avoid b-blocker therapy at this time. Her CHADS score is 3, so she will likely need to start some sort of anticoagulation therapy in the future, but cardiology will avoid for now with pending surgery. They plan to arrange out-patient cardiology follow-up (likely with Dr. Olga Millers) to re-evaluate aflutter, determine anticoagulation plan, and discuss follow-up for valvular disease. Noralyn Pick, NP to let patient/daughter know that she is cleared for surgery, but will need out-patient follow-up. I have updated Dr. Krista Blue and Dr. Annalee Genta.   Velna Ochs [facility] Short Stay Center/Anesthesiology Phone [phone] 04/13/2014 5:00 PM

## 2014-04-13 NOTE — Pre-Procedure Instructions (Signed)
Erma HeritageLizzie M Godley  04/13/2014   Your procedure is scheduled on: Tuesday, April 14, 2014  [facility]mitting at 5:30 AM.  Call this number if you have problems the morning of surgery: [phone]   Remember:   Do not eat food or drink liquids after midnight.   Take these medicines the morning of surgery with A SIP OF WATER: amLODipine (NORVASC), if needed: ondansetron (ZOFRAN), traMADol (ULTRAM) for pain  Stop taking Aspirin, vitamins and herbal medications. Do not take any NSAIDs ie: Ibuprofen, Advil, Naproxen or any medication containing Aspirin such as diclofenac (VOLTAREN); stop now.  Do not wear jewelry, make-up or nail polish.  Do not wear lotions, powders, or perfumes. You may not  wear deodorant.  Do not shave 48 hours prior to surgery.   Do not bring valuables to the hospital.  [facility] is not responsible for any belongings or valuables.               Contacts, dentures or bridgework may not be worn into surgery.  Leave suitcase in the car. After surgery it may be brought to your room.  For patients admitted to the hospital, discharge time is determined by your treatment team.               Patients discharged the day of surgery will not be allowed to drive home.  Name and phone number of your driver:   Special Instructions:  Special Instructions:Special Instructions: [facility] - [facility]  Before surgery, you can play an important role.  Because skin is not sterile, your skin needs to be as free of germs as possible.  You can reduce the number of germs on you skin by washing with CHG (chlorahexidine gluconate) soap before surgery.  CHG is an antiseptic cleaner which kills germs and bonds with the skin to continue killing germs even after washing.  Please DO NOT use if you have an allergy to CHG or antibacterial soaps.  If your skin becomes reddened/irritated stop using the CHG and inform your nurse when you arrive at Short Stay.  Do not  shave (including legs and underarms) for at least 48 hours prior to the first CHG shower.  You may shave your face.  Please follow these instructions carefully:   1.  Shower with CHG Soap the night before surgery and the morning of Surgery.  2.  If you choose to wash your hair, wash your hair first as usual with your normal shampoo.  3.  After you shampoo, rinse your hair and body thoroughly to remove the Shampoo.  4.  Use CHG as you would any other liquid soap.  You can apply chg directly  to the skin and wash gently with scrungie or a clean washcloth.  5.  Apply the CHG Soap to your body ONLY FROM THE NECK DOWN.  Do not use on open wounds or open sores.  Avoid contact with your eyes, ears, mouth and genitals (private parts).  Wash genitals (private parts) with your normal soap.  6.  Wash thoroughly, paying special attention to the area where your surgery will be performed.  7.  Thoroughly rinse your body with warm water from the neck down.  8.  DO NOT shower/wash with your normal soap after using and rinsing off the CHG Soap.  9.  Pat yourself dry with a clean towel.            10.  Wear clean pajamas.  11.  Place clean sheets on your bed the night of your first shower and do not sleep with pets.  Day of Surgery  Do not apply any lotions/deodorants the morning of surgery.  Please wear clean clothes to the hospital/surgery center.   Please read over the following fact sheets that you were given: Pain Booklet, Coughing and Deep Breathing and Surgical Site Infection Prevention

## 2014-04-13 NOTE — Progress Notes (Signed)
Anesthesia Pre-operative Evaluation: Patient is a 74 year old female scheduled for rigid esophagoscopy with dilitation and Botox injection tomorrow with Dr. Annalee GentaShoemaker. DX: Cricopharyngeal achalasia.   Other history includes non-smoker, HTN, rheumatic fever with history of murmur, CKD stage II, HLD, hiatal hernia, SOB, anemia, hysterectomy. BMI is consistent with obesity. She was hospitalized 03/15/14 for N/V/D and dizziness.  Presenting EKG also was concerning for third degree heart block then Wenckebach with labs showed hypokalemia with a K of 2.9, BUN was elevated at 31, Cr normal at 1.00. Troponin was negative.  She was treated with IVF, and discharged home on 3/141/6 with plans for follow-up EKG at her follow-up visit and determine if out-patient cardiology referral was felt warranted at that time. EKG on the day of discharge showed SR with first degree AVB (PR 310 ms), LVH, non-specific ST/T changes.  PCP is listed as Dr. Jess BartersKilma Lawrence with [facility]. Out-patient follow-up with GI Dr. Loreta AveMann was recommended due to anemia with FOBT + in ED.  By notes, colonoscopy on 02/06/11 showed small internal hemorrhoids and sigmoid diverticulosis.  PAT VITALS: BP 137/74, HR 73, RR 20, T 36.8, O2 sat 100%.  Meds include amlodipine, ASA, Tums, Vitamin D, diclofenac, Robitussin, Zofran, Kdur, tramadol. (Two of her antihypertensive medications were held following her 03/2014 admission due to hypotension, possibly related to dehydration.  She was previously on atenolol-chlorthalidone 100-25mg  daily and losartan 100mg  based on 10/2013 records. ASA was also recently discontinued due to anemia.)  04/09/07 Echo: SUMMARY - Overall left ventricular systolic function was vigorous. Left    ventricular ejection fraction was estimated , range being 65    % to 75 %. There was no diagnostic evidence of left    ventricular regional wall motion abnormalities. Left    ventricular wall thickness was mildly  increased. There was    mild focal basal septal hypertrophy. Doppler parameters were    consistent with high left ventricular filling pressure. - Aortic valve thickness was mildly increased. There was mildly    reduced aortic valve leaflet excursion. The mean transaortic    valve gradient was 7 mmHg. Estimated aortic valve area (by    VTI) was 1.94 cm^2. Estimated aortic valve area (by Vmax) was    1.87 cm^2. - The left atrium was mildly dilated.  03/16/14 CXR: No acute cardiopulmonary process.  03/26/14 Esophogram/Barium Swallow:  IMPRESSION: 1. Small hiatal hernia with nonobstructive ring. 2. The patient's choking and gagging symptoms were reproduced when she tried to swallow a barium pill, which lodged in the pharynx. A prominent cricopharyngeal bar was noted, and may be related.  Today's BMET and CBC noted.  BUN/Cr 10/0.66. Glucose 101.  H/H up to 10.0/32.0.  TSH was normal on 03/16/14.  Patient presents with two of her daughters.  She is a very pleasant black female in NAD.  She reports she is feeling well since her hospital discharge.  No more N/V/D or dizziness.  Denies history of chest pain or pre-syncope/syncope.  One night last week she awoke feeling SOB, but otherwise denied.  She is able to sleep flat on one pillow. Her weight has been stable.  She gets mild intermittent BLE edema.  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  Her job includes caring for a mentally and physically disabled 74 year old female.  His stature is 3-4 feet tall. She has to help with transfers, bathing, etc.  She does not get chest pain, prolonged SOB, or  pre-syncope with this level of activity.  She has a known murmur since childhood, but is not followed by a cardiologist. She thinks her last echo was in 2009. She denied known history of afib/flutter.  On exam she has no conversational dyspnea.  Heart RRR.  She does have a grade II-III/VI SEM.  Lungs clear.  Trace pedal  edema.  I have updated anesthesiologist Dr. Krista Blue.  Plan for cardiology pre-operative evaluation due to new onset aflutter and recent history of Wenckebach and possibly 3rd degree heart block in the setting of dehydration and hypopkalemia. Would anticipate she will be getting an echo in the near future, so that will help with re-assessing for valvular heart disease.  I called [facility], and they were able to work patient in this morning.  Mandy at Dr. Thurmon Fair office and patient and her daughters notified that surgery may need to be delayed depending of cardiology recommendations.  We will await formal cardiology recommendations.  Velna Ochs [facility] Short Stay Center/Anesthesiology Phone [phone] 04/13/2014 4:53 PM  Addendum: 04/13/14 Echo: - Left ventricle: The cavity size was normal. Wall thickness was increased in a pattern of mild LVH. There was mild focal basal hypertrophy of the septum. Systolic function was normal. The estimated ejection fraction was in the range of 55% to 60%. Wall motion was normal; there were no regional wall motion abnormalities. - Aortic valve: There was mild stenosis. - Mitral valve: Calcified annulus. Mildly thickened leaflets . There was moderate to severe regurgitation. - Left atrium: The atrium was severely dilated. - Atrial septum: There was a patent foramen ovale. - Pulmonary arteries: Systolic pressure was moderately to severely increased. PA peak pressure: 56 mm Hg (S).  I received a phone call from Rudi Coco, NP.  She has evaluated patient and reviewed surgery plans and echo findings with Dr. Johney Frame.  Her note is still pending, but by her report, Dr. Johney Frame has cleared patient with low to moderate risk for this procedure.  She is in rate controlled aflutter and is without significant CV symptoms. Her intermittent heart block last month was attributed to illness, hypokalemia, and high dose beta-blocker.  She did  recommend to avoid b-blocker therapy at this time. Her CHADS score is 3, so she will likely need to start some sort of anticoagulation therapy in the future, but cardiology will avoid for now with pending surgery.  They plan to arrange out-patient cardiology follow-up (likely with Dr. Olga Millers) to re-evaluate aflutter, determine anticoagulation plan, and discuss follow-up for valvular disease. Noralyn Pick, NP to let patient/daughter know that she is cleared for surgery, but will need out-patient follow-up.  I have updated Dr. Krista Blue and Dr. Annalee Genta.   Velna Ochs [facility] Short Stay Center/Anesthesiology Phone [phone] 04/13/2014 5:00 PM

## 2014-04-14 ENCOUNTER — Encounter (HOSPITAL_COMMUNITY): Payer: Self-pay | Admitting: *Deleted

## 2014-04-14 ENCOUNTER — Ambulatory Visit (HOSPITAL_COMMUNITY): Payer: Medicare Other | Admitting: Vascular Surgery

## 2014-04-14 ENCOUNTER — Encounter (HOSPITAL_COMMUNITY)
Admission: RE | Disposition: A | Discharge status: Home / Self Care | Payer: Self-pay | Source: Ambulatory Visit | Attending: Otolaryngology

## 2014-04-14 ENCOUNTER — Telehealth (HOSPITAL_COMMUNITY): Payer: Self-pay | Admitting: *Deleted

## 2014-04-14 ENCOUNTER — Ambulatory Visit (HOSPITAL_COMMUNITY): Payer: Medicare Other | Admitting: Anesthesiology

## 2014-04-14 ENCOUNTER — Ambulatory Visit (HOSPITAL_COMMUNITY)
Admission: RE | Admit: 2014-04-14 | Discharge: 2014-04-14 | Disposition: A | Discharge status: Home / Self Care | Payer: Medicare Other | Source: Ambulatory Visit | Attending: Otolaryngology | Admitting: Otolaryngology

## 2014-04-14 ENCOUNTER — Telehealth: Payer: Self-pay | Admitting: Cardiology

## 2014-04-14 DIAGNOSIS — Z6833 Body mass index (BMI) 33.0-33.9, adult: Secondary | ICD-10-CM | POA: Insufficient documentation

## 2014-04-14 DIAGNOSIS — Z791 Long term (current) use of non-steroidal anti-inflammatories (NSAID): Secondary | ICD-10-CM | POA: Diagnosis not present

## 2014-04-14 DIAGNOSIS — K220 Achalasia of cardia: Secondary | ICD-10-CM | POA: Insufficient documentation

## 2014-04-14 DIAGNOSIS — I129 Hypertensive chronic kidney disease with stage 1 through stage 4 chronic kidney disease, or unspecified chronic kidney disease: Secondary | ICD-10-CM | POA: Diagnosis not present

## 2014-04-14 DIAGNOSIS — N182 Chronic kidney disease, stage 2 (mild): Secondary | ICD-10-CM | POA: Diagnosis not present

## 2014-04-14 DIAGNOSIS — E669 Obesity, unspecified: Secondary | ICD-10-CM | POA: Insufficient documentation

## 2014-04-14 DIAGNOSIS — I4891 Unspecified atrial fibrillation: Secondary | ICD-10-CM | POA: Diagnosis not present

## 2014-04-14 DIAGNOSIS — Z7982 Long term (current) use of aspirin: Secondary | ICD-10-CM | POA: Insufficient documentation

## 2014-04-14 DIAGNOSIS — Z79891 Long term (current) use of opiate analgesic: Secondary | ICD-10-CM | POA: Insufficient documentation

## 2014-04-14 DIAGNOSIS — Z79899 Other long term (current) drug therapy: Secondary | ICD-10-CM | POA: Insufficient documentation

## 2014-04-14 DIAGNOSIS — M810 Age-related osteoporosis without current pathological fracture: Secondary | ICD-10-CM | POA: Diagnosis not present

## 2014-04-14 DIAGNOSIS — J392 Other diseases of pharynx: Secondary | ICD-10-CM | POA: Diagnosis not present

## 2014-04-14 DIAGNOSIS — M170 Bilateral primary osteoarthritis of knee: Secondary | ICD-10-CM | POA: Diagnosis not present

## 2014-04-14 DIAGNOSIS — M19042 Primary osteoarthritis, left hand: Secondary | ICD-10-CM | POA: Diagnosis not present

## 2014-04-14 DIAGNOSIS — R1310 Dysphagia, unspecified: Secondary | ICD-10-CM | POA: Diagnosis not present

## 2014-04-14 DIAGNOSIS — M19041 Primary osteoarthritis, right hand: Secondary | ICD-10-CM | POA: Insufficient documentation

## 2014-04-14 DIAGNOSIS — K222 Esophageal obstruction: Secondary | ICD-10-CM | POA: Insufficient documentation

## 2014-04-14 DIAGNOSIS — R1313 Dysphagia, pharyngeal phase: Secondary | ICD-10-CM | POA: Diagnosis not present

## 2014-04-14 HISTORY — PX: ESOPHAGOSCOPY WITH DILITATION: SHX5618

## 2014-04-14 SURGERY — ESOPHAGOSCOPY, WITH DILATION
Anesthesia: General | Site: Mouth

## 2014-04-14 MED ORDER — LIDOCAINE HCL 4 % MT SOLN
OROMUCOSAL | Status: DC | PRN
  Administered 2014-04-14: 4 mL via TOPICAL

## 2014-04-14 MED ORDER — LIDOCAINE HCL (CARDIAC) 20 MG/ML IV SOLN
INTRAVENOUS | Status: DC | PRN
  Administered 2014-04-14: 80 mg via INTRAVENOUS

## 2014-04-14 MED ORDER — SUCCINYLCHOLINE CHLORIDE 20 MG/ML IJ SOLN
INTRAMUSCULAR | Status: DC | PRN
  Administered 2014-04-14: 60 mg via INTRAVENOUS

## 2014-04-14 MED ORDER — FENTANYL CITRATE 0.05 MG/ML IJ SOLN
INTRAMUSCULAR | Status: DC | PRN
  Administered 2014-04-14 (×2): 50 ug via INTRAVENOUS

## 2014-04-14 MED ORDER — ONABOTULINUMTOXINA 100 UNITS IJ SOLR
100.0000 [IU] | Freq: Once | INTRAMUSCULAR | Status: DC
Start: 2014-04-14 — End: 2014-04-14
  Filled 2014-04-14: qty 100

## 2014-04-14 MED ORDER — ONABOTULINUMTOXINA 100 UNITS IJ SOLR
INTRAMUSCULAR | Status: DC | PRN
  Administered 2014-04-14: 100 [IU] via INTRAMUSCULAR

## 2014-04-14 MED ORDER — FENTANYL CITRATE 0.05 MG/ML IJ SOLN
INTRAMUSCULAR | Status: AC
  Filled 2014-04-14: qty 5

## 2014-04-14 MED ORDER — LIDOCAINE-EPINEPHRINE 1 %-1:100000 IJ SOLN
INTRAMUSCULAR | Status: AC
  Filled 2014-04-14: qty 1

## 2014-04-14 MED ORDER — PROPOFOL 10 MG/ML IV BOLUS
INTRAVENOUS | Status: AC
  Filled 2014-04-14: qty 20

## 2014-04-14 MED ORDER — MIDAZOLAM HCL 2 MG/2ML IJ SOLN
INTRAMUSCULAR | Status: AC
  Filled 2014-04-14: qty 2

## 2014-04-14 MED ORDER — ONDANSETRON HCL 4 MG/2ML IJ SOLN
INTRAMUSCULAR | Status: DC | PRN
  Administered 2014-04-14: 4 mg via INTRAVENOUS

## 2014-04-14 MED ORDER — EPINEPHRINE HCL (NASAL) 0.1 % NA SOLN
NASAL | Status: AC
  Filled 2014-04-14: qty 30

## 2014-04-14 MED ORDER — 0.9 % SODIUM CHLORIDE (POUR BTL) OPTIME
TOPICAL | Status: DC | PRN
  Administered 2014-04-14: 1000 mL

## 2014-04-14 MED ORDER — LACTATED RINGERS IV SOLN
INTRAVENOUS | Status: DC | PRN
  Administered 2014-04-14: 08:00:00 via INTRAVENOUS

## 2014-04-14 MED ORDER — MIDAZOLAM HCL 5 MG/5ML IJ SOLN
INTRAMUSCULAR | Status: DC | PRN
Start: 2014-04-14 — End: 2014-04-14
  Administered 2014-04-14 (×2): 1 mg via INTRAVENOUS

## 2014-04-14 MED ORDER — PROPOFOL 10 MG/ML IV BOLUS
INTRAVENOUS | Status: DC | PRN
  Administered 2014-04-14: 20 mg via INTRAVENOUS
  Administered 2014-04-14: 120 mg via INTRAVENOUS
  Administered 2014-04-14: 20 mg via INTRAVENOUS

## 2014-04-14 MED ORDER — ARTIFICIAL TEARS OP OINT
TOPICAL_OINTMENT | OPHTHALMIC | Status: DC | PRN
  Administered 2014-04-14: 1 via OPHTHALMIC

## 2014-04-14 MED ORDER — SODIUM CHLORIDE 0.9 % IJ SOLN
INTRAMUSCULAR | Status: DC | PRN
  Administered 2014-04-14: 10 mL via INTRAVENOUS

## 2014-04-14 MED ORDER — PROMETHAZINE HCL 25 MG/ML IJ SOLN
INTRAMUSCULAR | Status: AC
  Administered 2014-04-14: 6.25 mg
  Filled 2014-04-14: qty 1

## 2014-04-14 SURGICAL SUPPLY — 31 items
BALLN PULM 15 16.5 18X75 (BALLOONS)
BALLOON PULM 15 16.5 18X75 (BALLOONS) IMPLANT
BLADE SURG 15 STRL LF DISP TIS (BLADE) IMPLANT
BLADE SURG 15 STRL SS (BLADE)
CANISTER SUCTION 2500CC (MISCELLANEOUS) ×2 IMPLANT
COVER TABLE BACK 60X90 (DRAPES) ×2 IMPLANT
DRAPE PROXIMA HALF (DRAPES) ×2 IMPLANT
GAUZE SPONGE 4X4 12PLY STRL (GAUZE/BANDAGES/DRESSINGS) ×2 IMPLANT
GLOVE BIO SURGEON STRL SZ8 (GLOVE) ×2 IMPLANT
GLOVE ECLIPSE 7.5 STRL STRAW (GLOVE) ×2 IMPLANT
GLOVE SURG SS PI 6.0 STRL IVOR (GLOVE) ×4 IMPLANT
GLOVE SURG SS PI 6.5 STRL IVOR (GLOVE) ×2 IMPLANT
GUARD TEETH (MISCELLANEOUS) IMPLANT
H R LUBE JELLY XXX (MISCELLANEOUS) ×2 IMPLANT
KIT BASIN OR (CUSTOM PROCEDURE TRAY) ×2 IMPLANT
KIT ROOM TURNOVER OR (KITS) ×2 IMPLANT
MARKER SKIN DUAL TIP RULER LAB (MISCELLANEOUS) IMPLANT
NEEDLE 18GX1X1/2 (RX/OR ONLY) (NEEDLE) ×2 IMPLANT
NEEDLE HYPO 25GX1X1/2 BEV (NEEDLE) IMPLANT
NEEDLE INJECT RIGID (NEEDLE) ×2 IMPLANT
NS IRRIG 1000ML POUR BTL (IV SOLUTION) ×2 IMPLANT
PAD ARMBOARD 7.5X6 YLW CONV (MISCELLANEOUS) ×4 IMPLANT
PATTIES SURGICAL .5 X3 (DISPOSABLE) IMPLANT
SPECIMEN JAR SMALL (MISCELLANEOUS) IMPLANT
SYR 3ML LL SCALE MARK (SYRINGE) ×2 IMPLANT
SYR CONTROL 10ML LL (SYRINGE) IMPLANT
SYR TB 1ML LUER SLIP (SYRINGE) IMPLANT
SYRINGE 10CC LL (SYRINGE) ×2 IMPLANT
TOWEL OR 17X24 6PK STRL BLUE (TOWEL DISPOSABLE) ×2 IMPLANT
TUBE CONNECTING 12X1/4 (SUCTIONS) ×2 IMPLANT
WATER STERILE IRR 1000ML POUR (IV SOLUTION) ×2 IMPLANT

## 2014-04-14 NOTE — Anesthesia Postprocedure Evaluation (Signed)
  Anesthesia Post-op Note  [name]  Procedure(s) Performed: Procedure(s): RIGID ESOPHAGOSCOPY WITH DILITATION AND BOTOX  INJECTION (N/A)  Patient Location: PACU  Anesthesia Type:General  Level of Consciousness: awake  Airway and Oxygen Therapy: Patient Spontanous Breathing  Post-op Pain: none  Post-op Assessment: Post-op Vital signs reviewed, Patient's Cardiovascular Status Stable, Respiratory Function Stable, Patent Airway, No signs of Nausea or vomiting and Pain level controlled  Post-op Vital Signs: Reviewed and stable  Last Vitals:  Filed Vitals:   04/14/14 1145  BP:   Pulse: 76  Temp: 36 C  Resp: 16    Complications: No apparent anesthesia complications

## 2014-04-14 NOTE — Telephone Encounter (Addendum)
Called and spoke with daughter, Annice Pihjackie - informed her I had called to make the appointment for general cardiology and will call her back once I have the appointment in place.    4/13 - called and left message for daughter, Annice PihJackie - informing her of appointment with Dr. Patty SermonsBrackbill set up for May 3rd at 1130am for cardiology establishment in setting of new onset aflutter.

## 2014-04-14 NOTE — Discharge Instructions (Signed)
Esophageal Dilatation °The esophagus is the long, narrow tube which carries food and liquid from the mouth to the stomach. Esophageal dilatation is the technique used to stretch a blocked or narrowed portion of the esophagus. This procedure is used when a part of the esophagus has become so narrow that it becomes difficult, painful or even impossible to swallow. This is generally an uncomplicated form of treatment. When this is not successful, chest surgery may be required. This is a much more extensive form of treatment with a longer recovery time. °CAUSES  °Some of the more common causes of blockage or strictures of the esophagus are: °· Narrowing from longstanding inflammation (soreness and redness) of the lower esophagus. This comes from the constant exposure of the lower esophagus to the acid which bubbles up from the stomach. Over time this causes scarring and narrowing of the lower esophagus. °· Hiatal hernia in which a small part of the stomach bulges (herniates) up through the diaphragm. This can cause a gradual narrowing of the end of the esophagus. °· Schatzki ring is a narrow ring of benign (non-cancerous) fibrous tissue which constricts the lower esophagus. The reason for this is not known. °· Scleroderma is a connective tissue disorder that affects the esophagus and makes swallowing difficult. °· Achalasia is an absence of nerves to the lower esophagus and to the esophageal sphincter. This is the circular muscle between the stomach and esophagus that relaxes to allow food into the stomach. After swallowing, it contracts to keep food in the stomach. This absence of nerves may be congenital (present since birth). This can cause irregular spasms of the lower esophageal muscle. This spasm does not open up to allow food and fluid through. The result is a persistent blockage with subsequent slow trickling of the esophageal contents into the stomach. °· Strictures may develop from swallowing materials which  damage the esophagus. Some examples are strong acids or alkalis such as lye. °· Growths such as benign (non-cancerous) and malignant (cancerous) tumors can block the esophagus. °· Hereditary (present since birth) causes. °DIAGNOSIS  °Your caregiver often suspects this problem by taking a medical history. They will also do a physical exam. They can then prove their suspicions using X-rays and endoscopy. Endoscopy is an exam in which a tube like a small, flexible telescope is used to look at your esophagus.  °TREATMENT °There are different stretching (dilating) techniques that can be used. Simple bougie dilatation may be done in the office. This usually takes only a couple minutes. A numbing (anesthetic) spray of the throat is used. Endoscopy, when done, is done in an endoscopy suite under mild sedation. When fluoroscopy is used, the procedure is performed in X-ray. Other techniques require a little longer time. Recovery is usually quick. There is no waiting time to begin eating and drinking to test success of the treatment. Following are some of the methods used. °Narrowing of the esophagus is treated by making it bigger. °Commonly this is a mechanical problem which can be treated with stretching. This can be done in different ways. Your caregiver will discuss these with you. Some of the means used are: °· A series of graduated (increasing thickness) flexible dilators can be used. These are weighted tubes passed through the esophagus into the stomach. The tubes used become progressively larger until the desired stretched size is reached. Graduated dilators are a simple and quick way of opening the esophagus. No visualization is required. °· Another method is the use of endoscopy to place   a flexible wire across the stricture. The endoscope is removed and the wire left in place. A dilator with a hole through it from end to end is guided down the esophagus and across the stricture. One or more of these dilators are  passed over the wire. At the end of the exam, the wire is removed. This type of treatment may be performed in the X-ray department under fluoroscopy. An advantage of this procedure is the examiner is visualizing the end opening in the esophagus. °· Stretching of the esophagus may be done using balloons. Deflated balloons are placed through the endoscope and across the stricture. This type of balloon dilatation is often done at the time of endoscopy or fluoroscopy. Flexible endoscopy allows the examiner to directly view the stricture. A balloon is inserted in the deflated form into the area of narrowing. It is then inflated with air to a certain pressure that is preset for a given circumference. When inflated, it becomes sausage shaped, stretched, and makes the stricture larger. °· Achalasia requires a longer, larger balloon-type dilator. This is frequently done under X-ray control. In this situation, the spastic muscle fibers in the lower esophagus are stretched. °All of the above procedures make the passage of food and water into the stomach easier. They also make it easier for stomach contents to reflux back into the esophagus. Special medications may be used following the procedure to help prevent further stricturing. Proton-pump inhibitor medications are good at decreasing the amount of acid in the stomach juice. When stomach juice refluxes into the esophagus, the juice is no longer as acidic and is less likely to burn or scar the esophagus. °RISKS AND COMPLICATIONS °Esophageal dilatation is usually performed effectively and without problems. Some complications that can occur are: °· A small amount of bleeding almost always happens where the stretching takes place. If this is too excessive it may require more aggressive treatment. °· An uncommon complication is perforation (making a hole) of the esophagus. The esophagus is thin. It is easy to make a hole in it. If this happens, an operation may be necessary to  repair this. °· A small, undetected perforation could lead to an infection in the chest. This can be very serious. °HOME CARE INSTRUCTIONS  °· If you received sedation for your procedure, do not drive, make important decisions, or perform any activities requiring your full coordination. Do not drink alcohol, take sedatives, or use any mind altering chemicals unless instructed by your caregiver. °· You may use throat lozenges or warm salt water gargles if you have throat discomfort. °· You can begin eating and drinking normally on return home unless instructed otherwise. Do not purposely try to force large chunks of food down to test the benefits of your procedure. °· Mild discomfort can be eased with sips of ice water. °· Medications for discomfort may or may not be needed. °SEEK IMMEDIATE MEDICAL CARE IF:  °· You begin vomiting up blood. °· You develop black, tarry stools. °· You develop chills or an unexplained temperature of over 101°F (38.3°C) °· You develop chest or abdominal pain. °· You develop shortness of breath, or feel light-headed or faint. °· Your swallowing is becoming more painful, difficult, or you are unable to swallow. °MAKE SURE YOU:  °· Understand these instructions. °· Will watch your condition. °· Will get help right away if you are not doing well or get worse. °Document Released: 02/09/2005 Document Revised: 05/05/2013 Document Reviewed: 03/29/2005 °[employer]® Patient Information ©2015 [employer].   This information is not intended to replace advice given to you by your health care provider. Make sure you discuss any questions you have with your health care provider. ° °

## 2014-04-14 NOTE — Anesthesia Procedure Notes (Signed)
Procedure Name: Intubation Date/Time: 04/14/2014 8:03 AM Performed by: Suzy Bouchard Pre-anesthesia Checklist: Patient identified, Timeout performed, Emergency Drugs available, Suction available and Patient being monitored Patient Re-evaluated:Patient Re-evaluated prior to inductionOxygen Delivery Method: Circle system utilized Preoxygenation: Pre-oxygenation with 100% oxygen Intubation Type: IV induction Ventilation: Mask ventilation without difficulty Laryngoscope Size: Miller and 2 Grade View: Grade I Tube type: Oral Number of attempts: 1 Airway Equipment and Method: Stylet and LTA kit utilized Placement Confirmation: ETT inserted through vocal cords under direct vision,  breath sounds checked- equal and bilateral and positive ETCO2 Secured at: 21 cm Tube secured with: Tape Dental Injury: Teeth and Oropharynx as per pre-operative assessment

## 2014-04-14 NOTE — Progress Notes (Signed)
Report given to maria rn as caregiver 

## 2014-04-14 NOTE — Telephone Encounter (Signed)
Stacy called in stating that the pt has new onset Afib and Dr. Johney FrameAllred wanted this pt to be seen by Dr.Crenshaw within the next week. She will be a new pt  thanks

## 2014-04-14 NOTE — Op Note (Signed)
[name]:  [name], [name]                ACCOUNT NO.:  [id number]  MEDICAL RECORD NO.:  [id number]  LOCATION:  [facility]                         FACILITY:  [facility]  PHYSICIAN:  Kinnie Scalesavid L. Annalee GentaShoemaker, M.D.DATE OF BIRTH:  08-Oct-1940  DATE OF PROCEDURE:  04/14/2014 DATE OF DISCHARGE:                              OPERATIVE REPORT   PREOPERATIVE DIAGNOSES: 1. Severe dysphagia. 2. Cricopharyngeal muscle hypertrophy.  POSTOPERATIVE DIAGNOSES: 1. Severe dysphagia. 2. Cricopharyngeal muscle hypertrophy.  SURGEON:  Kinnie Scalesavid L. Annalee GentaShoemaker, M.D.  ASSISTANT:  Gloris ManchesterKarol T. Lazarus SalinesWolicki, M.D.  ANESTHESIA:  General endotracheal.  COMPLICATIONS:  None.  BLOOD LOSS:  None.  The patient transferred from the operating room to the recovery room in stable condition.  BRIEF HISTORY:  The patient is a 74 year old black female, who was referred by her gastroenterologist for progressive symptoms of dysphagia.  The patient has no prior history of smoking radiation or caustic ingestion.  She and her family have noted progressive symptoms of esophageal dysfunction and dysphagia with coughing and choking particularly with solids and pills.  She underwent upper GI evaluation through Gastroenterology and they were unable to pass the endoscope.  A barium swallow was then performed, which showed significant cricopharyngeal hypertrophy with narrowing of the esophageal introitus and poor transit through the upper esophagus.  No evidence of mass, mucosal lesion or tumor and no evidence of obvious Zenker's diverticulum.  The patient was referred to our office for further evaluation.  In the office, flexible laryngoscopy was performed, and the patient had a normal-appearing larynx, no evidence of posterior glottic abnormality, swelling, mass or tumor and no evidence of aspiration. Given the patient's history and findings, I recommended esophagoscopy, dilation and Botox injection to the cricopharyngeus muscle to improve her symptoms of  chronic dysphagia.  The risks and benefits of these procedures were discussed in detail and prior to surgery.  She was evaluated by her cardiologist and cleared for general anesthesia.  The patient and her family understood the risks and agreed with our surgical plan.  DESCRIPTION OF PROCEDURE:  The patient was brought to the operating room on April 14, 2014.  She was placed in supine position on the operating table, and general endotracheal anesthesia was established without difficulty.  Prior to surgical procedure, time-out was obtained and the patient was prepped, positioned and draped.  The procedure was begun with direct laryngoscopy using a Dedo laryngoscope.  The patient's oral cavity, oropharynx and larynx were examined.  Hypopharynx was examined. There was no evidence of mucosal lesion, mass, or ulcer.  The posterior aspect of the hypopharynx and esophageal introitus were then inspected using the laryngoscope.  The patient had redundant mucosa and some outpouching, which may represent an early diverticulum.  There was no evidence of foreign body material, mucosal mass or tumor.  The region of the cricopharyngeus was directly visualized and a 14-French Jackson esophageal dilator was carefully passed without resistance in order to identify the natural lumen of the esophagus.  The patient was then injected with 100 units of Botox, which was reconstituted in 3 mL of non- bacteriostatic saline and was injected through the laryngeal needle in three separate points circumferentially around the posterolateral aspect of the  cricopharyngeus muscle in a transmucosal fashion.  Total of 100 units was injected.  The cricopharyngeus and esophageal introitus were then dilated serially using Jackson dilators under direct visualization, dilated to 30-French without significant resistance or difficulty, no evidence of mucosal tear or bleeding.  The laryngoscope was then removed and the rigid  esophagoscope was inserted, there was some tightness at the esophageal introitus at the level of the dilation, but the esophagoscope passed without difficulty and the remainder of the cervical and thoracic esophagus appeared normal.  There was no evidence of mucosal lesion, mass or tumor.  The esophagoscope was inserted and then carefully withdrawn under direct visualization.  The cervical esophagoscope, which was somewhat larger in diameter was then inserted again without difficulty or trauma to the esophageal mucosa, it was inserted and then carefully withdrawn.  The patient's oropharynx and hypopharynx were irrigated and suctioned.  There was no bleeding, no evidence of esophageal trauma or tear.  The patient was then awakened from her anesthetic, she was extubated and was transferred from the operating room to the recovery room in stable condition.  There were no complications.  The blood loss was minimal.          ______________________________ Kinnie Scales. Annalee Genta, M.D.     DLS/MEDQ  D:  16/10/9602  T:  04/14/2014  Job:  [id number]

## 2014-04-14 NOTE — Anesthesia Preprocedure Evaluation (Addendum)
Anesthesia Evaluation  Patient identified by MRN, date of birth, ID band Patient awake    Reviewed: Allergy & Precautions, NPO status , Patient's Chart, lab work & pertinent test results  History of Anesthesia Complications Negative for: history of anesthetic complications  Airway Mallampati: I  TM Distance: >3 FB Neck ROM: Full    Dental  (+) Edentulous Upper, Edentulous Lower   Pulmonary shortness of breath, neg sleep apnea, neg COPDneg recent URI,  breath sounds clear to auscultation        Cardiovascular hypertension, Pt. on medications + dysrhythmias (newly diagnosed) Atrial Fibrillation + Valvular Problems/Murmurs Rhythm:Irregular     Neuro/Psych  Neuromuscular disease    GI/Hepatic Neg liver ROS, hiatal hernia,   Endo/Other    Renal/GU negative Renal ROS     Musculoskeletal  (+) Arthritis -,   Abdominal   Peds  Hematology  (+) anemia ,   Anesthesia Other Findings   Reproductive/Obstetrics                            Anesthesia Physical Anesthesia Plan  ASA: III  Anesthesia Plan: General   Post-op Pain Management:    Induction: Intravenous  Airway Management Planned: Oral ETT  Additional Equipment: None  Intra-op Plan:   Post-operative Plan: Extubation in OR  Informed Consent: I have reviewed the patients History and Physical, chart, labs and discussed the procedure including the risks, benefits and alternatives for the proposed anesthesia with the patient or authorized representative who has indicated his/her understanding and acceptance.   Dental advisory given  Plan Discussed with: CRNA and Surgeon  Anesthesia Plan Comments:         Anesthesia Quick Evaluation

## 2014-04-14 NOTE — H&P (Signed)
[name] is an 74 y.o. female.   Chief Complaint: dysphagia HPI: Prog dysphagia with CP hypertrophy  Past Medical History  Diagnosis Date  . Essential hypertension   . Osteoarthritis     knees and hands  . Systolic murmur     per patient report she has had rheumatic fever in childhood  . Osteoporosis, post-menopausal 2010    left femur T -2.7, left forearm T -3.5  . Blood transfusion without reported diagnosis     1960's, unknown reason  . Post-menopausal atrophic vaginitis   . Obesity, Class II, BMI 35-39.9   . Anemia 03/16/2014  . Chronic kidney disease (CKD), stage II (mild) 03/16/2014  . Hyperlipidemia 03/16/2014  . Cricopharyngeal achalasia 04/08/2014    Rigid esophagoscopy, dilation, and Botox injection into cricopharyngeus muscle recommended   . Shortness of breath dyspnea   . Wears glasses   . Cricopharyngeal achalasia   . History of hiatal hernia   . Pneumonia   . Rheumatic fever   . HOH (hard of hearing)     Past Surgical History  Procedure Laterality Date  . Abdominal hysterectomy      Fibroids  . Cyst excision      on hand  . Colonoscopy w/ biopsies and polypectomy    . Multiple tooth extractions      Family History  Problem Relation Age of Onset  . Cancer Mother     Unknown type  . Stroke Father   . Unexplained death Sister   . Other Brother     Unknown health  . Healthy Daughter   . Healthy Son   . Asthma Sister   . Other Sister     Unknown health  . Other Brother     Unknown health  . Other Brother     Unknown health  . Cancer Brother     Unknown type  . Cancer Brother     Unknown type  . Other Brother     Accident  . Other Brother     Chiropractor  . Healthy Daughter   . Healthy Daughter    Social History:  reports that she has never smoked. She has never used smokeless tobacco. She reports that she does not drink alcohol or use illicit drugs.  Allergies:  Allergies  Allergen Reactions  . Ace Inhibitors Other (See  Comments)    cough    Medications Prior to Admission  Medication Sig Dispense Refill  . amLODipine (NORVASC) 10 MG tablet Take 1 tablet (10 mg total) by mouth daily. 30 tablet 5  . aspirin 81 MG tablet Take 81 mg by mouth daily.      . calcium carbonate (TUMS) 500 MG chewable tablet Chew 1 tablet (200 mg of elemental calcium total) by mouth daily.    . Cholecalciferol (VITAMIN D) 2000 UNITS tablet Take 1 tablet (2,000 Units total) by mouth daily. 30 tablet 5  . diclofenac (VOLTAREN) 50 MG EC tablet Take 1 tablet (50 mg total) by mouth 2 (two) times daily. 180 tablet 3  . guaiFENesin (ROBITUSSIN) 100 MG/5ML SOLN Take 5 mLs (100 mg total) by mouth every 4 (four) hours as needed for cough or to loosen phlegm. 118 mL 0  . potassium chloride (K-DUR) 10 MEQ tablet Take 1 tablet (10 mEq total) by mouth daily. Please not dose change to 1 tablet daily 30 tablet 5  . traMADol (ULTRAM) 50 MG tablet Take 50 mg by mouth every 6 (six) hours as needed  for moderate pain or severe pain (pain).     . ondansetron (ZOFRAN) 4 MG tablet Take 1 tablet (4 mg total) by mouth every 8 (eight) hours as needed for nausea or vomiting. (Patient not taking: Reported on 04/13/2014) 20 tablet 0    Results for orders placed or performed during the hospital encounter of 04/13/14 (from the past 48 hour(s))  Basic metabolic panel     Status: Abnormal   Collection Time: 04/13/14 10:00 AM  Result Value Ref Range   Sodium 139 135 - 145 mmol/L   Potassium 3.5 3.5 - 5.1 mmol/L   Chloride 104 96 - 112 mmol/L   CO2 22 19 - 32 mmol/L   Glucose, Bld 101 (H) 70 - 99 mg/dL   BUN 10 6 - 23 mg/dL   Creatinine, Ser 0.66 0.50 - 1.10 mg/dL   Calcium 9.3 8.4 - 10.5 mg/dL   GFR calc non Af Amer 86 (L) >90 mL/min   GFR calc Af Amer >90 >90 mL/min    Comment: (NOTE) The eGFR has been calculated using the CKD EPI equation. This calculation has not been validated in all clinical situations. eGFR's persistently <90 mL/min signify possible  Chronic Kidney Disease.    Anion gap 13 5 - 15  CBC     Status: Abnormal   Collection Time: 04/13/14 10:10 AM  Result Value Ref Range   WBC 8.8 4.0 - 10.5 K/uL   RBC 3.91 3.87 - 5.11 MIL/uL   Hemoglobin 10.0 (L) 12.0 - 15.0 g/dL   HCT 32.9 (L) 36.0 - 46.0 %   MCV 84.1 78.0 - 100.0 fL   MCH 25.6 (L) 26.0 - 34.0 pg   MCHC 30.4 30.0 - 36.0 g/dL   RDW 15.9 (H) 11.5 - 15.5 %   Platelets 387 150 - 400 K/uL   No results found.  Review of Systems  Constitutional: Negative.   HENT: Negative.   Respiratory: Negative.   Cardiovascular: Negative.     Blood pressure 142/85, pulse 68, temperature 98.4 F (36.9 C), temperature source Oral, resp. rate 20, height 5' 4" (1.626 m), weight 89.359 kg (197 lb), SpO2 100 %. Physical Exam  Constitutional: She is oriented to person, place, and time. She appears well-developed and well-nourished.  Neck: Normal range of motion. Neck supple.  Respiratory: Effort normal.  GI: Soft.  Musculoskeletal: Normal range of motion.  Neurological: She is alert and oriented to person, place, and time.     Assessment/Plan Adm for OP DL/esophogoscopy/dilation and Botox injection  [place]ab Camps 04/14/2014, 7:25 AM

## 2014-04-14 NOTE — Telephone Encounter (Signed)
Forward to [employer]! BrandsDEBRA MATHIS RN TO PLACE ON SCHEDULE NOTIFIED STACY

## 2014-04-14 NOTE — Brief Op Note (Signed)
04/14/2014  8:47 AM  PATIENT:  [name]  74 y.o. female  PRE-OPERATIVE DIAGNOSIS:  cricopharygl achalasia/pharyngoesophageal dysphagia  POST-OPERATIVE DIAGNOSIS:  cricopharygl achalasia/pharyngoesophageal dysphagia  PROCEDURE:  Procedure(s): RIGID ESOPHAGOSCOPY WITH DILITATION AND BOTOX  INJECTION (N/A)  SURGEON:  Surgeon(s) and Role:    * Osborn Cohoavid Shyniece Scripter, MD - Primary    * Flo ShanksKarol Wolicki, MD - Assisting  PHYSICIAN ASSISTANT:   ASSISTANTS: Wolicki   ANESTHESIA:   general  EBL:   Min  BLOOD ADMINISTERED:none  DRAINS: none   LOCAL MEDICATIONS USED:  NONE  SPECIMEN:  No Specimen  DISPOSITION OF SPECIMEN:  N/A  COUNTS:  YES  TOURNIQUET:  * No tourniquets in log *  DICTATION: .Other Dictation: Dictation Number [id number]  PLAN OF CARE: Discharge to home after PACU  PATIENT DISPOSITION:  PACU - hemodynamically stable.   Delay start of Pharmacological VTE agent (>24hrs) due to surgical blood loss or risk of bleeding: not applicable

## 2014-04-14 NOTE — Transfer of Care (Signed)
Immediate Anesthesia Transfer of Care Note  [name]  Procedure(s) Performed: Procedure(s): RIGID ESOPHAGOSCOPY WITH DILITATION AND BOTOX  INJECTION (N/A)  Patient Location: PACU  Anesthesia Type:General  Level of Consciousness: awake and sedated  Airway & Oxygen Therapy: Patient Spontanous Breathing and Patient connected to nasal cannula oxygen  Post-op Assessment: Report given to RN and Post -op Vital signs reviewed and stable  Post vital signs: Reviewed and stable  Last Vitals:  Filed Vitals:   04/14/14 0605  BP: 142/85  Pulse: 68  Temp: 36.9 C  Resp: 20    Complications: No apparent anesthesia complications

## 2014-04-15 NOTE — Telephone Encounter (Signed)
Follow up scheduled

## 2014-04-16 ENCOUNTER — Encounter (HOSPITAL_COMMUNITY): Payer: Self-pay | Admitting: Otolaryngology

## 2014-04-25 DIAGNOSIS — M170 Bilateral primary osteoarthritis of knee: Secondary | ICD-10-CM | POA: Diagnosis not present

## 2014-04-28 DIAGNOSIS — H40013 Open angle with borderline findings, low risk, bilateral: Secondary | ICD-10-CM | POA: Diagnosis not present

## 2014-05-05 ENCOUNTER — Encounter: Payer: Self-pay | Admitting: Cardiology

## 2014-05-05 ENCOUNTER — Ambulatory Visit (INDEPENDENT_AMBULATORY_CARE_PROVIDER_SITE_OTHER): Payer: Medicare Other | Admitting: Cardiology

## 2014-05-05 VITALS — BP 140/78 | HR 60 | Ht 64.0 in | Wt 193.0 lb

## 2014-05-05 DIAGNOSIS — I051 Rheumatic mitral insufficiency: Secondary | ICD-10-CM | POA: Insufficient documentation

## 2014-05-05 DIAGNOSIS — R0600 Dyspnea, unspecified: Secondary | ICD-10-CM | POA: Diagnosis not present

## 2014-05-05 DIAGNOSIS — I099 Rheumatic heart disease, unspecified: Secondary | ICD-10-CM | POA: Diagnosis not present

## 2014-05-05 DIAGNOSIS — I4892 Unspecified atrial flutter: Secondary | ICD-10-CM

## 2014-05-05 DIAGNOSIS — D649 Anemia, unspecified: Secondary | ICD-10-CM | POA: Diagnosis not present

## 2014-05-05 HISTORY — DX: Rheumatic heart disease, unspecified: I09.9

## 2014-05-05 HISTORY — DX: Rheumatic mitral insufficiency: I05.1

## 2014-05-05 LAB — CBC WITH DIFFERENTIAL/PLATELET
BASOS ABS: 0 10*3/uL (ref 0.0–0.1)
Basophils Relative: 0.5 % (ref 0.0–3.0)
EOS ABS: 0.3 10*3/uL (ref 0.0–0.7)
EOS PCT: 3 % (ref 0.0–5.0)
HEMATOCRIT: 33.7 % — AB (ref 36.0–46.0)
HEMOGLOBIN: 10.8 g/dL — AB (ref 12.0–15.0)
Lymphocytes Relative: 24.6 % (ref 12.0–46.0)
Lymphs Abs: 2.6 10*3/uL (ref 0.7–4.0)
MCHC: 32 g/dL (ref 30.0–36.0)
MCV: 76.1 fl — AB (ref 78.0–100.0)
Monocytes Absolute: 0.9 10*3/uL (ref 0.1–1.0)
Monocytes Relative: 8.5 % (ref 3.0–12.0)
NEUTROS ABS: 6.7 10*3/uL (ref 1.4–7.7)
Neutrophils Relative %: 63.4 % (ref 43.0–77.0)
PLATELETS: 390 10*3/uL (ref 150.0–400.0)
RBC: 4.43 Mil/uL (ref 3.87–5.11)
RDW: 19.5 % — AB (ref 11.5–15.5)
WBC: 10.5 10*3/uL (ref 4.0–10.5)

## 2014-05-05 LAB — BASIC METABOLIC PANEL
BUN: 12 mg/dL (ref 6–23)
CHLORIDE: 103 meq/L (ref 96–112)
CO2: 29 meq/L (ref 19–32)
Calcium: 10.5 mg/dL (ref 8.4–10.5)
Creatinine, Ser: 0.68 mg/dL (ref 0.40–1.20)
GFR: 108.8 mL/min (ref >60.00)
GLUCOSE: 105 mg/dL — AB (ref 70–99)
POTASSIUM: 3.7 meq/L (ref 3.5–5.1)
SODIUM: 137 meq/L (ref 135–145)

## 2014-05-05 MED ORDER — APIXABAN 5 MG PO TABS
5.0000 mg | ORAL_TABLET | Freq: Two times a day (BID) | ORAL | Status: DC
Start: 2014-05-05 — End: 2014-09-17

## 2014-05-05 MED ORDER — PANTOPRAZOLE SODIUM 40 MG PO TBEC: 40.0000 mg | DELAYED_RELEASE_TABLET | Freq: Every day | ORAL | Status: DC

## 2014-05-05 NOTE — Progress Notes (Signed)
Cardiology Office Note   Date:  05/05/2014   ID:  [name], DOB 12-06-1940, MRN [id number]  PCP:  Abigail Serene, MD  Cardiologist: Abigail Clement MD  No chief complaint on file.     History of Present Illness: [name] is a 74 y.o. female who presents for evaluation of new atrial flutter.  She has a medical patient of Dr. Josem [name].  She was found to be in atrial flutter when she presented for a surgical procedure at [facility] on 04/13/2014.  Dr. Annalee [name] was scheduled to do a rigid esophagoscopy to evaluate her for dysphagia.  She was found to be in new atrial flutter.  She was cleared for the surgery and went on to have her surgical procedure without incident.  She was found to have cricopharyngeal muscle hypertrophy.  She was given an injection of Botox into the muscle.  She states that her swallowing difficulty is much improved.  She returns now for further evaluation of her atrial flutter.  She had an echocardiogram preoperatively on 04/13/2014 which showed the following:  - Left ventricle: The cavity size was normal. Wall thickness was increased in a pattern of mild LVH. There was mild focal basal hypertrophy of the septum. Systolic function was normal. The estimated ejection fraction was in the range of 55% to 60%. Wall motion was normal; there were no regional wall motion abnormalities. - Aortic valve: There was mild stenosis. - Mitral valve: Calcified annulus. Mildly thickened leaflets . There was moderate to severe regurgitation. - Left atrium: The atrium was severely dilated. - Atrial septum: There was a patent foramen ovale. - Pulmonary arteries: Systolic pressure was moderately to severely increased. PA peak pressure: 56 mm Hg (S).   the patient is relatively asymptomatic in terms of her atrial flutter.  She is currently on just aspirin.  Her ventricular response is 60/min and she is not on any AV blocking drugs.  She does not have any  history of congestive heart failure.  She is not diabetic.  She sleeps on one pillow and is not having any paroxysmal nocturnal dyspnea.  She does have a history of high blood pressure. She has a history of osteoarthritis and is on Voltaren. She does not have any history of GI bleeding. She does have a past history of heart block on beta blockers.    Past Medical History  Diagnosis Date  . Essential hypertension   . Osteoarthritis     knees and hands  . Systolic murmur     per patient report she has had rheumatic fever in childhood  . Osteoporosis, post-menopausal 2010    left femur T -2.7, left forearm T -3.5  . Blood transfusion without reported diagnosis     1960's, unknown reason  . Post-menopausal atrophic vaginitis   . Obesity, Class II, BMI 35-39.9   . Anemia 03/16/2014  . Chronic kidney disease (CKD), stage II (mild) 03/16/2014  . Hyperlipidemia 03/16/2014  . Cricopharyngeal achalasia 04/08/2014    Rigid esophagoscopy, dilation, and Botox injection into cricopharyngeus muscle recommended   . Shortness of breath dyspnea   . Wears glasses   . Cricopharyngeal achalasia   . History of hiatal hernia   . Pneumonia   . Rheumatic fever   . HOH (hard of hearing)     Past Surgical History  Procedure Laterality Date  . Abdominal hysterectomy      Fibroids  . Cyst excision      on hand  .  Colonoscopy w/ biopsies and polypectomy    . Multiple tooth extractions    . Esophagoscopy with dilitation N/A 04/14/2014    Procedure: RIGID ESOPHAGOSCOPY WITH DILITATION AND BOTOX  INJECTION;  Surgeon: Osborn Coho, MD;  Location: [facility] OR;  Service: ENT;  Laterality: N/A;     Current Outpatient Prescriptions  Medication Sig Dispense Refill  . amLODipine (NORVASC) 10 MG tablet Take 1 tablet (10 mg total) by mouth daily. 30 tablet 5  . calcium carbonate (TUMS) 500 MG chewable tablet Chew 1 tablet (200 mg of elemental calcium total) by mouth daily.    . Cholecalciferol (VITAMIN D) 2000 UNITS  tablet Take 1 tablet (2,000 Units total) by mouth daily. 30 tablet 5  . diclofenac (VOLTAREN) 50 MG EC tablet Take 1 tablet (50 mg total) by mouth 2 (two) times daily. 180 tablet 3  . guaiFENesin (ROBITUSSIN) 100 MG/5ML liquid Take 100 mg by mouth 4 (four) times daily as needed for cough (for cough).    . pantoprazole (PROTONIX) 40 MG tablet Take 1 tablet (40 mg total) by mouth daily. 30 tablet 5  . potassium chloride (K-DUR) 10 MEQ tablet Take 1 tablet (10 mEq total) by mouth daily. Please not dose change to 1 tablet daily 30 tablet 5  . traMADol (ULTRAM) 50 MG tablet Take 50 mg by mouth every 6 (six) hours as needed for moderate pain or severe pain (pain).     [name] apixaban (ELIQUIS) 5 MG TABS tablet Take 1 tablet (5 mg total) by mouth 2 (two) times daily. 60 tablet 3   No current facility-administered medications for this visit.    Allergies:   Ace inhibitors    Social History:  The patient  reports that she has never smoked. She has never used smokeless tobacco. She reports that she does not drink alcohol or use illicit drugs.   Family History:  The patient's family history includes Asthma in her sister; Cancer in her brother, brother, and mother; Healthy in her daughter, daughter, daughter, and son; Other in her brother, brother, brother, brother, brother, and sister; Stroke in her father; Unexplained death in her sister.    ROS:  Please see the history of present illness.   Otherwise, review of systems are positive for none.   All other systems are reviewed and negative.    PHYSICAL EXAM: VS:  BP 140/78 mmHg  Pulse 60  Ht  (1.626 m)  Wt 193 lb (87.544 kg)  BMI 33.11 kg/m2 , BMI Body mass index is 33.11 kg/(m^2). GEN: Well nourished, well developed, in no acute distress HEENT: normal Neck: no JVD, carotid bruits, or masses Cardiac: RRR; no murmurs, rubs, or gallops,no edema  Respiratory:  clear to auscultation bilaterally, normal work of breathing GI: soft, nontender,  nondistended, + BS MS: no deformity or atrophy Skin: warm and dry, no rash Neuro:  Strength and sensation are intact Psych: euthymic mood, full affect   EKG:  EKG is ordered today. The ekg ordered today demonstrates atrial flutter with slow and variable ventricular response.  Heart rate is 60 bpm   Recent Labs: 03/15/2014: ALT 21; B Natriuretic Peptide 166.0* 03/16/2014: Magnesium 1.8; TSH 3.297 05/05/2014: BUN 12; Creatinine 0.68; Hemoglobin 10.8*; Platelets 390.0; Potassium 3.7; Sodium 137    Lipid Panel    Component Value Date/Time   CHOL 129 03/16/2014 0616   TRIG 70 03/16/2014 0616   HDL 36* 03/16/2014 0616   CHOLHDL 3.6 03/16/2014 0616   VLDL 14 03/16/2014 1610  LDLCALC 79 03/16/2014 0616      Wt Readings from Last 3 Encounters:  05/05/14 193 lb (87.544 kg)  04/14/14 197 lb (89.359 kg)  03/24/14 203 lb 1.6 oz (92.126 kg)      Other studies Reviewed: Additional studies/ records that were reviewed today include: Hospital records from 04/13/14.    ASSESSMENT AND PLAN:  1.  Recent onset of atrial flutter fibrillation.  Her Chadds vasc score is 3 for age, female sex, and hypertension.  She should be on anticoagulation. 2.  Hypertension 3.  Moderate to severe mitral regurgitation with left atrial enlargement by echo    Current medicines are reviewed at length with the patient today.  The patient does not have concerns regarding medicines.  The following changes have been made:  We will start her on Eliquis 5 mg twice a day.  She will stop her baby aspirin.  She states she cannot do without her Voltaren.  For this reason we will add Protonix 40 mg daily to help protect her GI tract  Labs/ tests ordered today include:   Orders Placed This Encounter  Procedures  . Basic metabolic panel  . CBC with Differential/Platelet  . EKG 12-Lead     Plan: We will have her return in one month for a follow-up office visit and EKG.  Consider setting up elective outpatient  direct current cardioversion after that next visit.  Karie SchwalbeSigned, Treylen Gibbs MD 05/05/2014 7:20 PM    [facility] [address], [place], [place]  [address] Phone: [phone]; Fax: [phone]

## 2014-05-05 NOTE — Patient Instructions (Addendum)
Medication Instructions:  STOP ASPIRIN   START ELIQUIS 5 MG TWICE A DAY  START PROTONIX 40 MG DAILY   Labwork: BMET/CBC   Testing/Procedures: NONE  Follow-Up: 1 MONTH OV

## 2014-05-07 ENCOUNTER — Encounter: Payer: Self-pay | Admitting: Internal Medicine

## 2014-05-07 ENCOUNTER — Telehealth: Payer: Self-pay

## 2014-05-07 DIAGNOSIS — I4891 Unspecified atrial fibrillation: Secondary | ICD-10-CM | POA: Insufficient documentation

## 2014-05-07 DIAGNOSIS — I4892 Unspecified atrial flutter: Secondary | ICD-10-CM

## 2014-05-07 HISTORY — DX: Unspecified atrial flutter: I48.92

## 2014-05-07 NOTE — Telephone Encounter (Signed)
Prior auth for Eliquis sent to [facility] thru [employer].

## 2014-05-08 ENCOUNTER — Telehealth: Payer: Self-pay | Admitting: Cardiology

## 2014-05-08 ENCOUNTER — Telehealth: Payer: Self-pay

## 2014-05-08 NOTE — Telephone Encounter (Signed)
Additional clinical information sent to [facility] [facility] today for approval of Eliquis. PA # [id number].

## 2014-05-08 NOTE — Telephone Encounter (Signed)
New message     Need clinical information on eliquis 5mg  for prior approval.  It will expire on sunday

## 2014-05-08 NOTE — Telephone Encounter (Signed)
Abigail PimpleLinda M Reiland, LPN at 1/6/10965/06/2014 11:27 AM     Status: Signed       Expand All Collapse All   Additional clinical information sent to [facility] today for approval of Eliquis. PA # [id number].

## 2014-05-11 ENCOUNTER — Telehealth: Payer: Self-pay | Admitting: Cardiology

## 2014-05-11 ENCOUNTER — Telehealth: Payer: Self-pay

## 2014-05-11 NOTE — Telephone Encounter (Signed)
Informed patient that eliquis was approved and that Dr. Patty SermonsBrackbill wants pateint to add a multi-vitamin with iron daily.

## 2014-05-11 NOTE — Telephone Encounter (Signed)
New message ° ° ° ° ° °Returning a nurses call °

## 2014-05-11 NOTE — Telephone Encounter (Signed)
Fax received from [facility] [facility] with approval for patient;s Eliquis 5mg  tabs, good thru 01/02/2015. [id number].

## 2014-05-22 ENCOUNTER — Encounter: Payer: Self-pay | Admitting: Internal Medicine

## 2014-05-22 ENCOUNTER — Ambulatory Visit (INDEPENDENT_AMBULATORY_CARE_PROVIDER_SITE_OTHER): Payer: Medicare Other | Admitting: Internal Medicine

## 2014-05-22 VITALS — BP 146/86 | HR 83 | Temp 98.2°F | Wt 194.1 lb

## 2014-05-22 DIAGNOSIS — M170 Bilateral primary osteoarthritis of knee: Secondary | ICD-10-CM

## 2014-05-22 DIAGNOSIS — Z23 Encounter for immunization: Secondary | ICD-10-CM

## 2014-05-22 DIAGNOSIS — D649 Anemia, unspecified: Secondary | ICD-10-CM | POA: Diagnosis not present

## 2014-05-22 DIAGNOSIS — M810 Age-related osteoporosis without current pathological fracture: Secondary | ICD-10-CM

## 2014-05-22 DIAGNOSIS — Z6833 Body mass index (BMI) 33.0-33.9, adult: Secondary | ICD-10-CM

## 2014-05-22 DIAGNOSIS — Z1239 Encounter for other screening for malignant neoplasm of breast: Secondary | ICD-10-CM

## 2014-05-22 DIAGNOSIS — I10 Essential (primary) hypertension: Secondary | ICD-10-CM | POA: Diagnosis not present

## 2014-05-22 DIAGNOSIS — E559 Vitamin D deficiency, unspecified: Secondary | ICD-10-CM

## 2014-05-22 DIAGNOSIS — K220 Achalasia of cardia: Secondary | ICD-10-CM

## 2014-05-22 DIAGNOSIS — I4892 Unspecified atrial flutter: Secondary | ICD-10-CM

## 2014-05-22 DIAGNOSIS — I483 Typical atrial flutter: Secondary | ICD-10-CM

## 2014-05-22 DIAGNOSIS — I099 Rheumatic heart disease, unspecified: Secondary | ICD-10-CM

## 2014-05-22 DIAGNOSIS — N952 Postmenopausal atrophic vaginitis: Secondary | ICD-10-CM

## 2014-05-22 DIAGNOSIS — E669 Obesity, unspecified: Secondary | ICD-10-CM

## 2014-05-22 DIAGNOSIS — E876 Hypokalemia: Secondary | ICD-10-CM | POA: Diagnosis not present

## 2014-05-22 DIAGNOSIS — Z0000 Encounter for general adult medical examination without abnormal findings: Secondary | ICD-10-CM

## 2014-05-22 LAB — BASIC METABOLIC PANEL WITH GFR
BUN: 14 mg/dL (ref 6–23)
CALCIUM: 9.5 mg/dL (ref 8.4–10.5)
CO2: 26 meq/L (ref 19–32)
CREATININE: 0.7 mg/dL (ref 0.50–1.10)
Chloride: 102 mEq/L (ref 96–112)
GFR, Est African American: >89 mL/min
GFR, Est Non African American: 86 mL/min
GLUCOSE: 89 mg/dL (ref 70–99)
Potassium: 3.8 mEq/L (ref 3.5–5.3)
Sodium: 138 mEq/L (ref 135–145)

## 2014-05-22 LAB — FERRITIN: FERRITIN: 21 ng/mL (ref 10–291)

## 2014-05-22 MED ORDER — TRAMADOL HCL 50 MG PO TABS: 50.0000 mg | ORAL_TABLET | Freq: Four times a day (QID) | ORAL | Status: DC | PRN

## 2014-05-22 MED ORDER — AMLODIPINE BESYLATE 10 MG PO TABS: 10.0000 mg | ORAL_TABLET | Freq: Every day | ORAL | Status: DC

## 2014-05-22 MED ORDER — DICLOFENAC SODIUM 50 MG PO TBEC: 50.0000 mg | DELAYED_RELEASE_TABLET | Freq: Two times a day (BID) | ORAL | Status: DC

## 2014-05-22 NOTE — Assessment & Plan Note (Signed)
After her Botox injection she has done well and denies any difficulty at this time with swallowing. We will reassess for dysphagia at the follow-up visit.

## 2014-05-22 NOTE — Assessment & Plan Note (Signed)
She continues with the calcium and vitamin D but stopped taking the bisphosphonate as she was told to do so since she had been on it for 5 years. She was willing to undergo a repeat DEXA scan to assess whether or not her osteoporosis has stabilized or improved compared to the previous reading in 2010. An order for a DEXA scan was placed and an appointment was obtained and given to the patient. We will follow-up the results of the DEXA scan and compare it to the 2010 study when it results.

## 2014-05-22 NOTE — Assessment & Plan Note (Signed)
She has lost weight and now her obesity has been downgraded from class II to class I with a BMI just under 34. She was congratulated on her work at decreasing her weight as this will improve her osteoarthritis pain as well as the success she has in managing her atrial flutter. She was encouraged to continue portion control and a healthy diet and to remain as active as possible recognizing the limitations in activity secondary to her bilateral knee osteoarthritis. We will reassess her weight at the follow-up visit.

## 2014-05-22 NOTE — Patient Instructions (Signed)
It was good to see you again today.  You are doing a good job taking care of yourself!  1) We checked your potassium and vitamin D levels today.  I will call you next week when I get the results as we may be able to stop the potassium and decrease the vitamin D.  2) We gave you the pneumonia shot today.  3) We will order a mammogram and DEXA scan (bones).  4) Stop the diclofrenac (the red pill) for your knee pain as it may interact with your blood thinner.  5) Take the tramadol 50 mg up to 4 times a day for knee pain.  This will not interact with your blood thinner.  I called this medication into your pharmacy.  I will see you in 6 months, sooner if necessary.

## 2014-05-22 NOTE — Assessment & Plan Note (Signed)
She continues to have bilateral knee pain and has received Synvisc for this. There was some confusion as to which medication she should take that would not interact with her anticoagulation. We went over these medications and I showed her the tablets that were safe to take and the tablets that were not safe to take. The diclofenac was stopped because of the potential interaction with the apixaban. She will continue with the tramadol as needed for her pain. If she no longer requires the diclofenac at the next visit we will stop the pantoprazole as she will not require NSAID associated gastritis prophylaxis. We will reassess the symptomatic control of her osteoarthritis of the knees on the tramadol at the follow-up visit.

## 2014-05-22 NOTE — Assessment & Plan Note (Signed)
Since the last clinic visit she was diagnosed with paroxysmal atrial flutter. Interestingly, she likely has some conduction system disease as her heart rate is normal despite no AV nodal agents. She is tolerating the apixaban well and only rarely has a sensation of palpitations. We discussed her risk of stroke given her chads2vasc score of 3. Given her risk the importance of compliance with the apixaban was stressed. We will continue the anticoagulation for the presumed rheumatic heart disease associated atrial flutter and avoid nodal agents given her likely underlying conduction system abnormalities.

## 2014-05-22 NOTE — Assessment & Plan Note (Signed)
A vitamin D level was obtained and is pending at the time of this dictation. If it returns abnormal we will decrease her vitamin D dose from 2000 international units daily back to 800 international units daily.

## 2014-05-22 NOTE — Assessment & Plan Note (Signed)
A CBC was obtained to assess for persistence of her anemia. A ferritin was also obtained to see if she had an iron deficiency associated with her anemia. These results are pending at the time of this dictation.

## 2014-05-22 NOTE — Assessment & Plan Note (Signed)
She was given the Prevnar 13 Pneumovax today. A mammogram and DEXA scan were ordered and scheduled. We will discuss her interest in Zostavax at the follow-up visit. She is otherwise up-to-date on her preventative health care maintenance.

## 2014-05-22 NOTE — Assessment & Plan Note (Signed)
She denies any vaginal itching symptoms at this visit. When it does occur she takes over-the-counter Monistat or occasionally uses yogurt. We will continue to follow symptomatically.

## 2014-05-22 NOTE — Assessment & Plan Note (Signed)
Her blood pressure today was 146/86. Given her age, this is at target at less than 150/90. This is while on amlodipine 10 mg by mouth daily. We will continue the amlodipine at 10 mg by mouth daily and reassess her blood pressure at the follow-up visit.

## 2014-05-22 NOTE — Assessment & Plan Note (Signed)
A potassium level was checked today to assess if she is normokalemic. It is pending at the time of this dictation, but if well within the normal limits we will consider stopping the potassium chloride and reassessing her potassium at the follow-up visit.

## 2014-05-22 NOTE — Progress Notes (Signed)
   Subjective:    Patient ID: [name], female    DOB: [birth year], 74 y.o.   MRN: [id number]  HPI  [name] is here for follow-up of her paroxysmal atrial flutter, rheumatic heart disease, and osteoarthritis of her knees. Please see the A&P for the status of the pt's chronic medical problems.  Review of Systems  Constitutional: Negative for activity change, appetite change and unexpected weight change.  Respiratory: Negative for cough, chest tightness, shortness of breath and wheezing.   Cardiovascular: Positive for palpitations. Negative for chest pain and leg swelling.  Gastrointestinal: Positive for constipation. Negative for nausea, vomiting, abdominal pain and diarrhea.  Musculoskeletal: Positive for arthralgias. Negative for joint swelling and gait problem.  Neurological: Negative for dizziness and syncope.      Objective:   Physical Exam  Constitutional: She is oriented to person, place, and time. She appears well-developed and well-nourished. No distress.  HENT:  Head: Normocephalic and atraumatic.  Eyes: Conjunctivae are normal. Right eye exhibits no discharge. Left eye exhibits no discharge. No scleral icterus.  Cardiovascular: Normal rate and regular rhythm.  Exam reveals no gallop and no friction rub.   Murmur heard. Pulmonary/Chest: Effort normal and breath sounds normal. No respiratory distress. She has no wheezes. She has no rales.  Musculoskeletal: Normal range of motion. She exhibits no edema or tenderness.  Neurological: She is alert and oriented to person, place, and time. She exhibits normal muscle tone.  Skin: Skin is warm and dry. No rash noted. She is not diaphoretic. No erythema.  Psychiatric: She has a normal mood and affect. Her behavior is normal. Judgment and thought content normal.  Nursing note and vitals reviewed.     Assessment & Plan:   Please see problem oriented charting.

## 2014-05-23 LAB — VITAMIN D 25 HYDROXY (VIT D DEFICIENCY, FRACTURES): Vit D, 25-Hydroxy: 36 ng/mL (ref 30–100)

## 2014-05-25 NOTE — Progress Notes (Signed)
BMP: Unremarkable, K 3.8, eGFR > 89  Ferritin 21  Vitamin D 36  I called [name] with the results.  She will stop the iron supplementation and we will reassess the ferritin at the follow-up visit.  She will stop the KCl and we will check the potassium level at the follow-up visit.  She will finish her bottle of Vitamin D 2000 units daily and at the follow-up visit I will restart vitamin D at 800 IU daily.

## 2014-05-26 NOTE — Addendum Note (Signed)
Addended by: Doneen PoissonKLIMA, Jonatan Wilsey D on: 05/26/2014 09:55 AM   Modules accepted: Orders, Medications

## 2014-06-02 ENCOUNTER — Ambulatory Visit (HOSPITAL_COMMUNITY): Payer: Medicare Other

## 2014-06-02 ENCOUNTER — Ambulatory Visit (HOSPITAL_COMMUNITY)
Admission: RE | Admit: 2014-06-02 | Discharge: 2014-06-02 | Disposition: A | Discharge status: Home / Self Care | Payer: Medicare Other | Source: Ambulatory Visit | Attending: Internal Medicine | Admitting: Internal Medicine

## 2014-06-02 DIAGNOSIS — Z780 Asymptomatic menopausal state: Secondary | ICD-10-CM | POA: Insufficient documentation

## 2014-06-02 DIAGNOSIS — Z1231 Encounter for screening mammogram for malignant neoplasm of breast: Secondary | ICD-10-CM | POA: Diagnosis not present

## 2014-06-02 DIAGNOSIS — Z1239 Encounter for other screening for malignant neoplasm of breast: Secondary | ICD-10-CM

## 2014-06-02 DIAGNOSIS — Z13820 Encounter for screening for osteoporosis: Secondary | ICD-10-CM | POA: Insufficient documentation

## 2014-06-02 DIAGNOSIS — M810 Age-related osteoporosis without current pathological fracture: Secondary | ICD-10-CM

## 2014-06-03 ENCOUNTER — Encounter: Payer: Self-pay | Admitting: Cardiology

## 2014-06-03 ENCOUNTER — Ambulatory Visit (INDEPENDENT_AMBULATORY_CARE_PROVIDER_SITE_OTHER): Payer: Medicare Other | Admitting: Cardiology

## 2014-06-03 VITALS — BP 140/92 | HR 78 | Ht 64.0 in | Wt 192.4 lb

## 2014-06-03 DIAGNOSIS — I051 Rheumatic mitral insufficiency: Secondary | ICD-10-CM

## 2014-06-03 DIAGNOSIS — I4819 Other persistent atrial fibrillation: Secondary | ICD-10-CM

## 2014-06-03 DIAGNOSIS — R609 Edema, unspecified: Secondary | ICD-10-CM

## 2014-06-03 DIAGNOSIS — I481 Persistent atrial fibrillation: Secondary | ICD-10-CM | POA: Diagnosis not present

## 2014-06-03 MED ORDER — FUROSEMIDE 20 MG PO TABS: 20.0000 mg | ORAL_TABLET | Freq: Every day | ORAL | Status: DC | PRN

## 2014-06-03 NOTE — Patient Instructions (Signed)
Medication Instructions:  START LASIX (FUROSEMIDE) 20 MG DAILY AS NEEDED FOR FLUID  Labwork: NONE  Testing/Procedures: NONE  Follow-Up: Your physician wants you to follow-up in:  4 MONTH OV/EKG/BMET/CBC You will receive a reminder letter in the mail two months in advance. If you don't receive a letter, please call our office to schedule the follow-up appointment.

## 2014-06-03 NOTE — Progress Notes (Signed)
Cardiology Office Note   Date:  06/03/2014   ID:  [name], DOB January 05, 1940, MRN [id number]  PCP:  Rocco Serene, MD  Cardiologist: Cassell Clement MD  No chief complaint on file.     History of Present Illness: [name] is a 74 y.o. female who presents for a one-month follow-up office visit.  [name] is a 74 y.o. female who is being seen for follow-up of her recent onset of atrial flutter fibrillation.. She has a medical patient of Dr. Josem Kaufmann. She was found to be in atrial flutter when she presented for a surgical procedure at [facility] on 04/13/2014.She had had a previous EKG on 03/16/14 which showed that she was in normal sinus rhythm at that time.  Dr. Annalee Genta was scheduled to do a rigid esophagoscopy to evaluate her for dysphagia. She was found to be in new atrial flutter. She was cleared for the surgery and went on to have her surgical procedure without incident. She was found to have cricopharyngeal muscle hypertrophy. She was given an injection of Botox into the muscle. She states that her swallowing difficulty is much improved. She returns now for further evaluation of her atrial flutter. She had an echocardiogram preoperatively on 04/13/2014 which showed the following:  - Left ventricle: The cavity size was normal. Wall thickness was increased in a pattern of mild LVH. There was mild focal basal hypertrophy of the septum. Systolic function was normal. The estimated ejection fraction was in the range of 55% to 60%. Wall motion was normal; there were no regional wall motion abnormalities. - Aortic valve: There was mild stenosis. - Mitral valve: Calcified annulus. Mildly thickened leaflets . There was moderate to severe regurgitation. - Left atrium: The atrium was severely dilated. - Atrial septum: There was a patent foramen ovale. - Pulmonary arteries: Systolic pressure was moderately to severely increased. PA peak pressure:  56 mm Hg (S).  the patient is relatively asymptomatic in terms of her atrial flutter. She is currently on just aspirin. Her ventricular response is 60/min and she is not on any AV blocking drugs. She does not have any history of congestive heart failure. She is not diabetic. She sleeps on one pillow and is not having any paroxysmal nocturnal dyspnea. She does have a history of high blood pressure.  She has had some mild ankle edema.  She is on amlodipine but has been on amlodipine for many years. She has a history of rheumatoid arthritis. She does not have any history of GI bleeding. She does have a past history of heart block on beta blockers. She has been on and requests 5 mg twice a day since her last visit one month ago and she has not been having any side effects from the Elliquis.  Past Medical History  Diagnosis Date  . Essential hypertension   . Osteoarthritis     knees and hands  . Systolic murmur     per patient report she has had rheumatic fever in childhood  . Osteoporosis, post-menopausal 2010    left femur T -2.7, left forearm T -3.5  . Blood transfusion without reported diagnosis     1960's, unknown reason  . Post-menopausal atrophic vaginitis   . Obesity, Class I, BMI 30-34.9   . Anemia 03/16/2014  . Cricopharyngeal achalasia 04/08/2014    Rigid esophagoscopy, dilation, and Botox injection into cricopharyngeus muscle recommended   . Chronic rheumatic heart disease 05/05/2014  . Mitral valve regurgitation, rheumatic  05/05/2014    Moderate to severe with peak PA pressure of 56 mmHg on Echo 04/2014   . Atrial flutter 05/07/2014    With controlled ventricular rate on no AV nodal agents     Past Surgical History  Procedure Laterality Date  . Abdominal hysterectomy      Fibroids  . Cyst excision      on hand  . Colonoscopy w/ biopsies and polypectomy    . Multiple tooth extractions    . Esophagoscopy with dilitation N/A 04/14/2014    Procedure: RIGID ESOPHAGOSCOPY WITH  DILITATION AND BOTOX  INJECTION;  Surgeon: Osborn Cohoavid Shoemaker, MD;  [facility];  Service: ENT;  Laterality: N/A;     Current Outpatient Prescriptions  Medication Sig Dispense Refill  . amLODipine (NORVASC) 10 MG tablet Take 1 tablet (10 mg total) by mouth daily. 90 tablet 3  . apixaban (ELIQUIS) 5 MG TABS tablet Take 1 tablet (5 mg total) by mouth 2 (two) times daily. 60 tablet 3  . calcium carbonate (TUMS) 500 MG chewable tablet Chew 1 tablet (200 mg of elemental calcium total) by mouth daily.    . Cholecalciferol (VITAMIN D) 2000 UNITS tablet Take 1 tablet (2,000 Units total) by mouth daily. 30 tablet 5  . guaiFENesin (ROBITUSSIN) 100 MG/5ML liquid Take 100 mg by mouth 4 (four) times daily as needed for cough (for cough).    . pantoprazole (PROTONIX) 40 MG tablet Take 1 tablet (40 mg total) by mouth daily. 30 tablet 5  . traMADol (ULTRAM) 50 MG tablet Take 1 tablet (50 mg total) by mouth every 6 (six) hours as needed for severe pain (pain). 120 tablet 5  . furosemide (LASIX) 20 MG tablet Take 1 tablet (20 mg total) by mouth daily as needed for fluid. 30 tablet 5   No current facility-administered medications for this visit.    Allergies:   Ace inhibitors    Social History:  The patient  reports that she has never smoked. She has never used smokeless tobacco. She reports that she does not drink alcohol or use illicit drugs.   Family History:  The patient's family history includes Asthma in her sister; Cancer in her brother, brother, and mother; Healthy in her daughter, daughter, daughter, and son; Other in her brother, brother, brother, brother, brother, and sister; Stroke in her father; Unexplained death in her sister.    ROS:  Please see the history of present illness.   Otherwise, review of systems are positive for none.   All other systems are reviewed and negative.    PHYSICAL EXAM: VS:  BP 140/92 mmHg  Pulse 78  Ht 5\' 4"  (1.626 m)  Wt 192 lb 6.4 oz (87.272 kg)  BMI 33.01  kg/m2 , BMI Body mass index is 33.01 kg/(m^2). GEN: Well nourished, well developed, in no acute distress HEENT: normal Neck: no JVD, carotid bruits, or masses Cardiac: Irregularly irregular rhythm.  Grade 2/6 holosystolic murmur of mitral regurgitation at apex.  There is mild ankle edema. Respiratory:  clear to auscultation bilaterally, normal work of breathing GI: soft, nontender, nondistended, + BS MS: no deformity or atrophy Skin: warm and dry, no rash Neuro:  Strength and sensation are intact Psych: euthymic mood, full affect   EKG:  EKG is ordered today. The ekg ordered today demonstrates atrial flutter fibrillation with controlled ventricular response and heart rate of 71 bpm   Recent Labs: 03/15/2014: ALT 21; B Natriuretic Peptide 166.0* 03/16/2014: Magnesium 1.8; TSH 3.297 05/05/2014: Hemoglobin 10.8*;  Platelets 390.0 05/22/2014: BUN 14; Creatinine 0.70; Potassium 3.8; Sodium 138    Lipid Panel    Component Value Date/Time   CHOL 129 03/16/2014 0616   TRIG 70 03/16/2014 0616   HDL 36* 03/16/2014 0616   CHOLHDL 3.6 03/16/2014 0616   VLDL 14 03/16/2014 0616   LDLCALC 79 03/16/2014 0616      Wt Readings from Last 3 Encounters:  06/03/14 192 lb 6.4 oz (87.272 kg)  05/22/14 194 lb 1.6 oz (88.043 kg)  05/05/14 193 lb (87.544 kg)        ASSESSMENT AND PLAN:  1. Recent onset of atrial flutter fibrillation. Her Chadds vasc score is 3 for age, female sex, and hypertension. Continue long-term anticoagulation. 2. Hypertension 3. Moderate to severe mitral regurgitation with left atrial enlargement by echo   Current medicines are reviewed at length with the patient today.  The patient does not have concerns regarding medicines.  The following changes have been made:  no change  Labs/ tests ordered today include:  Orders Placed This Encounter  Procedures  . Basic metabolic panel  . CBC with Differential/Platelet  . EKG 12-Lead     Disposition: I had a long  conversation with the patient and her daughter concerning the pros and cons of elective cardioversion.  It would be reasonable to try cardioversion once.  However likelihood of maintaining normal sinus rhythm long-term is low because of her severe left atrial enlargement and her moderate to severe mitral regurgitation.  The patient and family have elected not to pursue cardioversion.  We will continue with strategy of anticoagulation and rate control.  Her heart rate is satisfactory without any AV blocking drugs.  In the past she has had bradycardia from beta blockers. We will give her Lasix 20 mg daily on an as-needed basis for significant fluid retention if that occurs.  Recheck in 4 months for office visit EKG CBC and basal metabolic panel.  Karie Schwalbe MD 06/03/2014 5:09 PM    [facility] [address] [place], [place], [place]  [address] Phone: [phone]; Fax: [phone]

## 2014-06-08 ENCOUNTER — Telehealth: Payer: Self-pay

## 2014-06-08 NOTE — Telephone Encounter (Signed)
Spoke with daughter and explained the copay and deductible Daughter stated she understood and that they would continue with this medication

## 2014-06-08 NOTE — Telephone Encounter (Signed)
Patient's daughter called about a copay card for eliquis, I tried to talk to her to let her know that her mother did not qualified for a free copay card, but she said did the doctor or nurse tell me that. So she states that she will be here today to talk to the nurse. I also called [employer] to find out why her Eliquis was 220.00 dollars and they stated that 175 dollars was a deductable because of it being a tier 3 after the deductable has been paid it will be 45.00 a month

## 2014-06-29 ENCOUNTER — Encounter: Payer: Self-pay | Admitting: Cardiology

## 2014-06-29 ENCOUNTER — Encounter: Payer: Self-pay | Admitting: Internal Medicine

## 2014-06-29 ENCOUNTER — Telehealth: Payer: Self-pay

## 2014-06-29 ENCOUNTER — Encounter: Payer: Self-pay | Admitting: Family Medicine

## 2014-06-29 NOTE — Telephone Encounter (Addendum)
Adela LankJacqueline would like to know if Dr Kenyon AnaKurt was the one who gave her mother shots in her knees. They are trying to locate the Dr that did do it. Please call [phone]

## 2014-06-30 ENCOUNTER — Telehealth: Payer: Self-pay | Admitting: Cardiology

## 2014-06-30 NOTE — Telephone Encounter (Signed)
Left message to call back   The Apixaban can sometimes cause nausea and may be affecting her mother's appetite. They could try switching to Xarelto 20 mg once a day and see if it makes a difference.  In regards to her knee injections she should hold angticog therapy for 24 hours prior

## 2014-06-30 NOTE — Telephone Encounter (Signed)
Spoke with pt's daughter and advised her Dr. Yisroel Rammingaldorf gave the injections.

## 2014-06-30 NOTE — Telephone Encounter (Signed)
Left message to call back      I hope all is well with you and your staff. I want to thank you first for being so kind to my Mom and I. I am noticing that my Mom is loosing weight and I was wondering is any of the meds she is on from your office would cause her to not have an appetite. I tried to research this myself, but I am not as well informed as you all would be.  thanks so much.  Elyn PeersJackie Bynum, her daughter.  Please note to have the nurse to call me instead of my mom or her home. [phone]

## 2014-06-30 NOTE — Telephone Encounter (Signed)
Follow up      Pt needs clarity on whether the injection will interfere with afib. Please advise

## 2014-06-30 NOTE — Telephone Encounter (Signed)
New message      Pt get injections in knee.  Pt needs to know if injections will interfere with the afib. Please advise   Injection doctor: Dr. Marcene CorningPeter Dalldorf  [phone]

## 2014-07-01 NOTE — Telephone Encounter (Signed)
Spoke with daughter and she does not feel patient is having nausea and will just stick with the current Apixaban,  Dr. Patty SermonsBrackbill aware. Advised to have her hold for 24 hours prior to injection

## 2014-07-15 ENCOUNTER — Encounter: Payer: Self-pay | Admitting: Cardiology

## 2014-07-16 ENCOUNTER — Telehealth: Payer: Self-pay | Admitting: *Deleted

## 2014-07-16 NOTE — Telephone Encounter (Signed)
Spoke with daughter and she will discuss with her mother and call back/send message tomorrow      We can change her to Xarelto 20 mg one daily.    ----- Message -----     From: Lorayne BenderAnita E Harding, RN     Sent: 07/15/2014 10:36 AM      To: Laveda NormanMelinda B Aylee Littrell, Thomas Brackbill, MD    Subject: Annell GreeningFW: Non-Urgent Medical Question                       ----- Message -----     From: Erma HeritageLizzie M Stemm     Sent: 07/15/2014 10:29 AM      To: [facility] Triage    Subject: Non-Urgent Medical Question                 Hello    I would like to see if we can change my mother's blood thinner, she is on Eloquis and is loosing weight as I had shared with you previously . We would like to see if she can try another blood thinner please. My Mom got the injections in her knees for her arthritis and has started back on the Eloquis. She was off the Eloquis and her appetite picked up within one day. So we would appreciate if we can go to another blood thinner please.    Thank you for everything.     Elyn PeersJackie Bynum     her daughter, [phone]

## 2014-08-24 NOTE — Addendum Note (Signed)
Encounter addended by: Doneen Poisson, MD on: 08/24/2014 10:45 AM<BR>     Documentation filed: Notes Section

## 2014-08-24 NOTE — Progress Notes (Signed)
DEXA scan shows improvement in T score of left hip compared to previous DEXA scan.  Will maintain on vitamin D and calcium and continue to hold the bisphosphonate given a 5 year course has been completed.

## 2014-09-14 DIAGNOSIS — M1711 Unilateral primary osteoarthritis, right knee: Secondary | ICD-10-CM | POA: Diagnosis not present

## 2014-09-14 DIAGNOSIS — M1712 Unilateral primary osteoarthritis, left knee: Secondary | ICD-10-CM | POA: Diagnosis not present

## 2014-09-17 ENCOUNTER — Other Ambulatory Visit: Payer: Self-pay | Admitting: Cardiology

## 2014-09-17 NOTE — Telephone Encounter (Signed)
Is the patient still to be taking this? After reading the phone note from July, I wanted to be sure as it was mentioned that she could change to xarelto. Please advise. Thanks, MI

## 2014-09-17 NOTE — Telephone Encounter (Signed)
Ok to fill 30 day supply per [employer] for 30 day supply. I called daughter back and explained I could send in 30 day supply and the nurse would be calling her within the next 24-48 hours to discuss further.

## 2014-09-18 ENCOUNTER — Telehealth: Payer: Self-pay | Admitting: *Deleted

## 2014-09-18 NOTE — Telephone Encounter (Signed)
Tried to call daughter to discuss and unable to leave message Will follow up next week   Candice A Price, CMA  Yesterday   (3:49 PM)    Ok to fill 30 day supply per [employer] for 30 day supply. I called daughter back and explained I could send in 30 day supply and the nurse would be calling her within the next 24-48 hours to discuss further.

## 2014-09-25 NOTE — Telephone Encounter (Signed)
Per daughter patient is feeling good now and wants to continue Eliquis

## 2014-10-06 DIAGNOSIS — K220 Achalasia of cardia: Secondary | ICD-10-CM | POA: Diagnosis not present

## 2014-10-06 DIAGNOSIS — R1314 Dysphagia, pharyngoesophageal phase: Secondary | ICD-10-CM | POA: Diagnosis not present

## 2014-10-07 ENCOUNTER — Encounter: Payer: Self-pay | Admitting: Cardiology

## 2014-10-07 ENCOUNTER — Ambulatory Visit (INDEPENDENT_AMBULATORY_CARE_PROVIDER_SITE_OTHER): Payer: Medicare Other | Admitting: Cardiology

## 2014-10-07 VITALS — BP 126/78 | HR 79 | Ht 64.0 in | Wt 186.0 lb

## 2014-10-07 DIAGNOSIS — I481 Persistent atrial fibrillation: Secondary | ICD-10-CM

## 2014-10-07 DIAGNOSIS — I051 Rheumatic mitral insufficiency: Secondary | ICD-10-CM

## 2014-10-07 DIAGNOSIS — I4819 Other persistent atrial fibrillation: Secondary | ICD-10-CM

## 2014-10-07 NOTE — Patient Instructions (Signed)
Medication Instructions:  Your physician recommends that you continue on your current medications as directed. Please refer to the Current Medication list given to you today.  Labwork: NONE  Testing/Procedures: NONE  Follow-Up: Your physician recommends that you schedule a follow-up appointment in: 4 MONTH OV WITH LORI G NP OR SCOTT W PA      

## 2014-10-07 NOTE — Progress Notes (Signed)
Cardiology Office Note   Date:  10/07/2014   ID:  [name], DOB 07-11-1940, MRN [id number]  PCP:  Rocco Serene, MD  Cardiologist: Cassell Clement MD  No chief complaint on file.     History of Present Illness: [name] is a 74 y.o. female who presents for a four-month follow-up office visit.  She has a medical patient of Dr. Josem Kaufmann. She was found to be in atrial flutter when she presented for a surgical procedure at [facility] on 04/13/2014.She had had a previous EKG on 03/16/14 which showed that she was in normal sinus rhythm at that time. Dr. Annalee Genta was scheduled to do a rigid esophagoscopy to evaluate her for dysphagia. She was found to be in new atrial flutter. She was cleared for the surgery and went on to have her surgical procedure without incident. She was found to have cricopharyngeal muscle hypertrophy. She was given an injection of Botox into the muscle. She states that her swallowing difficulty is much improved. . She had an echocardiogram preoperatively on 04/13/2014 which showed the following:  - Left ventricle: The cavity size was normal. Wall thickness was increased in a pattern of mild LVH. There was mild focal basal hypertrophy of the septum. Systolic function was normal. The estimated ejection fraction was in the range of 55% to 60%. Wall motion was normal; there were no regional wall motion abnormalities. - Aortic valve: There was mild stenosis. - Mitral valve: Calcified annulus. Mildly thickened leaflets . There was moderate to severe regurgitation. - Left atrium: The atrium was severely dilated. - Atrial septum: There was a patent foramen ovale. - Pulmonary arteries: Systolic pressure was moderately to severely increased. PA peak pressure: 56 mm Hg (S).  the patient is relatively asymptomatic in terms of her atrial flutter. . Her ventricular response is 60/min and she is not on any AV blocking drugs. She does  not have any history of congestive heart failure. She is not diabetic. She sleeps on one pillow and is not having any paroxysmal nocturnal dyspnea. She does have a history of high blood pressure. She does have a past history of heart block on beta blockers.  She has significant osteoarthritis of her knees, particularly the right knee and has been told that she might need to have a total knee replacement.  She cannot remember her orthopedic doctor's name. She does not take her furosemide often.  However she does have nocturia each night several times.  She has not had a urology evaluation for this.   Past Medical History  Diagnosis Date  . Essential hypertension   . Osteoarthritis     knees and hands  . Systolic murmur     per patient report she has had rheumatic fever in childhood  . Osteoporosis, post-menopausal 2010    left femur T -2.7, left forearm T -3.5  . Blood transfusion without reported diagnosis     1960's, unknown reason  . Post-menopausal atrophic vaginitis   . Obesity, Class I, BMI 30-34.9   . Anemia 03/16/2014  . Cricopharyngeal achalasia 04/08/2014    Rigid esophagoscopy, dilation, and Botox injection into cricopharyngeus muscle recommended   . Chronic rheumatic heart disease 05/05/2014  . Mitral valve regurgitation, rheumatic 05/05/2014    Moderate to severe with peak PA pressure of 56 mmHg on Echo 04/2014   . Atrial flutter (HCC) 05/07/2014    With controlled ventricular rate on no AV nodal agents     Past Surgical  History  Procedure Laterality Date  . Abdominal hysterectomy      Fibroids  . Cyst excision      on hand  . Colonoscopy w/ biopsies and polypectomy    . Multiple tooth extractions    . Esophagoscopy with dilitation N/A 04/14/2014    Procedure: RIGID ESOPHAGOSCOPY WITH DILITATION AND BOTOX  INJECTION;  Surgeon: Osborn Coho, MD;  Location: [facility] OR;  Service: ENT;  Laterality: N/A;     Current Outpatient Prescriptions  Medication Sig Dispense Refill    . amLODipine (NORVASC) 10 MG tablet Take 1 tablet (10 mg total) by mouth daily. 90 tablet 3  . calcium carbonate (TUMS) 500 MG chewable tablet Chew 1 tablet (200 mg of elemental calcium total) by mouth daily.    . Cholecalciferol (VITAMIN D) 2000 UNITS tablet Take 1 tablet (2,000 Units total) by mouth daily. 30 tablet 5  . ELIQUIS 5 MG TABS tablet TAKE 1 TABLET(5 MG) BY MOUTH TWICE DAILY 60 tablet 0  . furosemide (LASIX) 20 MG tablet Take 1 tablet (20 mg total) by mouth daily as needed for fluid. 30 tablet 5  . guaiFENesin (ROBITUSSIN) 100 MG/5ML liquid Take 100 mg by mouth 4 (four) times daily as needed for cough (for cough).    . pantoprazole (PROTONIX) 40 MG tablet Take 1 tablet (40 mg total) by mouth daily. 30 tablet 5  . traMADol (ULTRAM) 50 MG tablet Take 1 tablet (50 mg total) by mouth every 6 (six) hours as needed for severe pain (pain). 120 tablet 5   No current facility-administered medications for this visit.    Allergies:   Ace inhibitors    Social History:  The patient  reports that she has never smoked. She has never used smokeless tobacco. She reports that she does not drink alcohol or use illicit drugs.   Family History:  The patient's family history includes Asthma in her sister; Cancer in her brother, brother, and mother; Healthy in her daughter, daughter, daughter, and son; Other in her brother, brother, brother, brother, brother, and sister; Stroke in her father; Unexplained death in her sister.    ROS:  Please see the history of present illness.   Otherwise, review of systems are positive for none.   All other systems are reviewed and negative.    PHYSICAL EXAM: VS:  BP 126/78 mmHg  Pulse 79  Ht  (1.626 m)  Wt 186 lb (84.369 kg)  BMI 31.91 kg/m2 , BMI Body mass index is 31.91 kg/(m^2). GEN: Well nourished, well developed, in no acute distress HEENT: normal Neck: no JVD, carotid bruits, or masses Cardiac: Irregularly irregular no murmurs, rubs, or gallops,no  edema  Respiratory:  clear to auscultation bilaterally, normal work of breathing GI: soft, nontender, nondistended, + BS MS: no deformity or atrophy Skin: warm and dry, no rash Neuro:  Strength and sensation are intact Psych: euthymic mood, full affect   EKG:  EKG is ordered today. The ekg ordered today demonstrates atrial fibrillation with ventricular response of 79 bpm.  Since previous tracing of 06/03/14, no significant change   Recent Labs: 03/15/2014: ALT 21; B Natriuretic Peptide 166.0* 03/16/2014: Magnesium 1.8; TSH 3.297 05/05/2014: Hemoglobin 10.8*; Platelets 390.0 05/22/2014: BUN 14; Creat 0.70; Potassium 3.8; Sodium 138    Lipid Panel    Component Value Date/Time   CHOL 129 03/16/2014 0616   TRIG 70 03/16/2014 0616   HDL 36* 03/16/2014 0616   CHOLHDL 3.6 03/16/2014 0616   VLDL 14 03/16/2014 7829  LDLCALC 79 03/16/2014 0616      Wt Readings from Last 3 Encounters:  10/07/14 186 lb (84.369 kg)  06/03/14 192 lb 6.4 oz (87.272 kg)  05/22/14 194 lb 1.6 oz (88.043 kg)        ASSESSMENT AND PLAN:  1. Recent onset of atrial flutter fibrillation. Her Chadds vasc score is 3 for age, female sex, and hypertension. Continue long-term anticoagulation. 2. Hypertension 3. Moderate to severe mitral regurgitation with left atrial enlargement by echo 4.  Osteoarthritis of both knees. 5.  Nocturia and urinary frequency  Disposition: We will continue with strategy of anticoagulation and rate control. Her heart rate is satisfactory without any AV blocking drugs. In the past she has had bradycardia from beta blockers she will discuss with her PCP the possibility of a urology consultation.  Current medicines are reviewed at length with the patient today.  The patient does not have concerns regarding medicines.  The following changes have been made:  no change  Labs/ tests ordered today include:   Orders Placed This Encounter  Procedures  . EKG 12-Lead    His  physician: I commended her on her 6 pound weight loss.  She'll continue current medication.  She will return in 4 months at which time I will be retired and she will be seeing a new cardiologist in my place.  Karie Schwalbe MD 10/07/2014 6:43 PM    [facility] [address] [place], [place], [place]  [address] Phone: [phone]; Fax: [phone]

## 2014-10-16 ENCOUNTER — Other Ambulatory Visit: Payer: Self-pay | Admitting: Cardiology

## 2014-10-20 ENCOUNTER — Other Ambulatory Visit: Payer: Self-pay | Admitting: Cardiology

## 2014-11-06 ENCOUNTER — Ambulatory Visit (INDEPENDENT_AMBULATORY_CARE_PROVIDER_SITE_OTHER): Payer: Medicare Other | Admitting: Internal Medicine

## 2014-11-06 ENCOUNTER — Encounter: Payer: Self-pay | Admitting: Internal Medicine

## 2014-11-06 VITALS — BP 144/88 | HR 91 | Temp 97.8°F | Wt 183.0 lb

## 2014-11-06 DIAGNOSIS — I4892 Unspecified atrial flutter: Secondary | ICD-10-CM

## 2014-11-06 DIAGNOSIS — D649 Anemia, unspecified: Secondary | ICD-10-CM | POA: Diagnosis not present

## 2014-11-06 DIAGNOSIS — E66811 Obesity, class 1: Secondary | ICD-10-CM

## 2014-11-06 DIAGNOSIS — I483 Typical atrial flutter: Secondary | ICD-10-CM

## 2014-11-06 DIAGNOSIS — M810 Age-related osteoporosis without current pathological fracture: Secondary | ICD-10-CM

## 2014-11-06 DIAGNOSIS — Z7901 Long term (current) use of anticoagulants: Secondary | ICD-10-CM

## 2014-11-06 DIAGNOSIS — I10 Essential (primary) hypertension: Secondary | ICD-10-CM | POA: Diagnosis not present

## 2014-11-06 DIAGNOSIS — M170 Bilateral primary osteoarthritis of knee: Secondary | ICD-10-CM

## 2014-11-06 DIAGNOSIS — B3731 Acute candidiasis of vulva and vagina: Secondary | ICD-10-CM

## 2014-11-06 DIAGNOSIS — K220 Achalasia of cardia: Secondary | ICD-10-CM

## 2014-11-06 DIAGNOSIS — Z0000 Encounter for general adult medical examination without abnormal findings: Secondary | ICD-10-CM

## 2014-11-06 DIAGNOSIS — K5909 Other constipation: Secondary | ICD-10-CM | POA: Diagnosis not present

## 2014-11-06 DIAGNOSIS — Z6831 Body mass index (BMI) 31.0-31.9, adult: Secondary | ICD-10-CM

## 2014-11-06 DIAGNOSIS — Z09 Encounter for follow-up examination after completed treatment for conditions other than malignant neoplasm: Secondary | ICD-10-CM

## 2014-11-06 DIAGNOSIS — E669 Obesity, unspecified: Secondary | ICD-10-CM

## 2014-11-06 DIAGNOSIS — B373 Candidiasis of vulva and vagina: Secondary | ICD-10-CM | POA: Diagnosis not present

## 2014-11-06 MED ORDER — TRAMADOL HCL 50 MG PO TABS: 100.0000 mg | ORAL_TABLET | Freq: Four times a day (QID) | ORAL | Status: DC | PRN

## 2014-11-06 MED ORDER — FLUCONAZOLE 150 MG PO TABS: ORAL_TABLET | ORAL | Status: DC

## 2014-11-06 MED ORDER — SORBITOL 70 % PO SOLN: 15.0000 mL | Freq: Every day | ORAL | Status: DC | PRN

## 2014-11-06 NOTE — Progress Notes (Signed)
   Subjective:    Patient ID: [name], female    DOB: Nov 19, 1940, 74 y.o.   MRN: [id number]  HPI  Abigail HeritageLizzie M Lansdale is here for follow-up of bilateral knee osteoarthritis, vaginal itching, and constipation. Please see the A&P for the status of the pt's chronic medical problems.  Review of Systems  Constitutional: Negative for activity change, appetite change and unexpected weight change.  Cardiovascular: Negative for leg swelling.  Gastrointestinal: Positive for constipation. Negative for nausea, vomiting, abdominal pain, diarrhea, blood in stool, abdominal distention and anal bleeding.  Genitourinary:       Vaginal itching  Musculoskeletal: Positive for arthralgias and gait problem. Negative for joint swelling.  Neurological: Negative for weakness and numbness.      Objective:   Physical Exam  Constitutional: She is oriented to person, place, and time. She appears well-developed and well-nourished. No distress.  HENT:  Head: Normocephalic and atraumatic.  Eyes: Conjunctivae are normal. Right eye exhibits no discharge. Left eye exhibits no discharge. No scleral icterus.  Cardiovascular: Normal rate.   Murmur heard. Irregularly irregular  Pulmonary/Chest: Effort normal and breath sounds normal. No respiratory distress. She has no wheezes. She has no rales.  Abdominal: Soft. Bowel sounds are normal. She exhibits no distension. There is no tenderness. There is no rebound and no guarding.  Musculoskeletal: Normal range of motion. She exhibits edema.  1+ pitting edema to shins bilaterally  Neurological: She is alert and oriented to person, place, and time. She exhibits normal muscle tone.  Skin: Skin is warm and dry. No rash noted. She is not diaphoretic. No erythema.  Psychiatric: She has a normal mood and affect. Her behavior is normal. Judgment and thought content normal.  Nursing note and vitals reviewed.     Assessment & Plan:   Please see problem oriented charting.

## 2014-11-06 NOTE — Assessment & Plan Note (Signed)
She continues to have progressive pain in both knees. This makes ambulation difficult. She ultimately plans to have bilateral knee replacements and is aware this has been recommended. At this point the tramadol is effective but she feels the dose is too low. She has been taking 50 mg twice daily with some relief, which is as the tramadol is prescribed. On occasion she will take an extra 50 mg dose with improved pain control. Given the effectiveness of the tramadol we decided to increase it to 100 mg by mouth every 8-12 hours as needed for knee pain. We will reassess whether or not the escalation of the tramadol dose is effective in improving her pain control and ability to ambulate at the follow-up visit.

## 2014-11-06 NOTE — Assessment & Plan Note (Signed)
Her blood pressure today was at target for her age at 86144/88. This is on amlodipine 10 mg by mouth daily. We will continue with the amlodipine at 10 mg by mouth daily and reassess her blood pressure at the follow-up visit.

## 2014-11-06 NOTE — Assessment & Plan Note (Signed)
We discussed the results of the most recent DEXA scan and compared them to the results from 2010. Although she still has osteoporosis, this is markedly improved from previously after her 5 years of bisphosphonate therapy. We decided to decrease her vitamin D dose to 800 international units daily (which she will obtain OTC) along with her calcium supplementation. At this point we do not believe bisphosphonate therapy is necessary, especially after her response after 5 years.

## 2014-11-06 NOTE — Assessment & Plan Note (Signed)
She's had no further problems with swallowing after the Botox injection in April. We will continue to monitor for recurrence of symptoms.

## 2014-11-06 NOTE — Assessment & Plan Note (Signed)
She has a history of recurrent vaginal candidiasis. This has usually been responsive to over-the-counter Monistat. Unfortunately, she's been unable to afford this therapy on a consistent basis and when she does use it it seems to now be ineffective. I offered a speculum exam to assess her vagina and obtain samples. Given her knee pain she deferred stating the symptoms were typical of her usual vaginal candidiasis. We therefore decided to try fluconazole 150 mg every 3 days for 3 doses. If this is ineffective she is to call as she may require reassessment including a formal pelvic examination. We will reassess her symptom control at the follow-up visit.

## 2014-11-06 NOTE — Assessment & Plan Note (Signed)
She's had no symptomatic palpitations or dizziness with her atrial fibrillation. She's made the decision to defer an attempted cardioversion given the low likelihood of success with rheumatic heart disease and a very dilated left atrium. She does not require any nodal agents as her rate is controlled likely secondary to some underlying conduction system disease. She is tolerating the Eliquis well although does dread the $45 per month price tag. We will continue with her anticoagulation given her chadsVasc score of 3. We will reassess for symptoms at the follow-up visit.

## 2014-11-06 NOTE — Assessment & Plan Note (Signed)
She continues her current slow and steady weight loss which is intentional. She was praised for her success in this area and encouraged to continue. If she is able to continue the pace of weight loss by the next visit she should be in the overweight rather than obese category.

## 2014-11-06 NOTE — Assessment & Plan Note (Signed)
She has already receive the influenza vaccination. She has given me permission to discuss any of the healthcare issues that she may have with her daughter Annice PihJackie. Jackie's phone number is [phone].

## 2014-11-06 NOTE — Patient Instructions (Signed)
It was good to see you again.  You are doing a nice job with your health.  1)  Stop the vitamin D you are taking and start vitamin D 800 IU (international units) daily.  2) Start fluconazole 150 mg every three days for three doses.  This is for the itching.  3) Start sorbitol 70% 1-5 tablespoons in coffee each day and adjust for frequency of bowel movements.  4) Increase the tramadol to 100 mg (2 tablets) every 8-12 hours as needed for pain.  5) Try to limit the fluid you drink after dinner.  6) I drew some labs today.  I will call your daughter next week if there are any changes we need to make.  I will see you in 6 months, sooner if necessary.

## 2014-11-06 NOTE — Assessment & Plan Note (Signed)
We checked a ferritin today to make sure it is still within the normal range after stopping the supplemental iron. We also checked a potassium after stopping the potassium chloride supplementation at the previous visit. The results are pending at the time of this dictation.

## 2014-11-06 NOTE — Assessment & Plan Note (Signed)
She has had chronic constipation for which she would like therapy for. She has tried MiraLAX and other over-the-counter medications without relief. We decided, given the chronic nature of her constipation, she decided to try sorbitol 70% 1-5 tablespoons in the morning coffee adjusted to the desired frequency of bowel movements. This medication is not on her insurance formulary but they wanted to see how much a bottle would cost. Therefore, I sent the prescription to her pharmacy so they could find this out. If it was too expensive they were asked to call me and we would find an alternative. We will reassess the constipation symptoms at the follow-up visit.

## 2014-11-07 LAB — BMP8+ANION GAP
Anion Gap: 17 mmol/L (ref 10.0–18.0)
BUN / CREAT RATIO: 13 (ref 11–26)
BUN: 8 mg/dL (ref 8–27)
CO2: 24 mmol/L (ref 18–29)
CREATININE: 0.63 mg/dL (ref 0.57–1.00)
Calcium: 9.9 mg/dL (ref 8.7–10.3)
Chloride: 98 mmol/L (ref 97–106)
GFR calc non Af Amer: 89 mL/min/{1.73_m2} (ref >59)
GFR, EST AFRICAN AMERICAN: 102 mL/min/{1.73_m2} (ref >59)
Glucose: 86 mg/dL (ref 65–99)
Potassium: 3.9 mmol/L (ref 3.5–5.2)
Sodium: 139 mmol/L (ref 136–144)

## 2014-11-07 LAB — FERRITIN: FERRITIN: 15 ng/mL (ref 15–150)

## 2014-11-18 ENCOUNTER — Other Ambulatory Visit: Payer: Self-pay | Admitting: Cardiology

## 2014-11-24 NOTE — Progress Notes (Signed)
K 3.9  No evidence of hypokalemia after stopping the KCl  Ferritin 15  Low end of normal value.  No need for reinstitution of iron supplementation at this time, although we will recheck the ferritin level at the follow-up and if it has dropped to the abnormal range we will restart the oral iron supplementation.

## 2014-12-07 DIAGNOSIS — M25562 Pain in left knee: Secondary | ICD-10-CM | POA: Diagnosis not present

## 2014-12-07 DIAGNOSIS — M25561 Pain in right knee: Secondary | ICD-10-CM | POA: Diagnosis not present

## 2014-12-15 ENCOUNTER — Other Ambulatory Visit: Payer: Self-pay | Admitting: Internal Medicine

## 2014-12-16 ENCOUNTER — Other Ambulatory Visit: Payer: Self-pay | Admitting: Internal Medicine

## 2014-12-30 ENCOUNTER — Telehealth: Payer: Self-pay | Admitting: Internal Medicine

## 2014-12-30 ENCOUNTER — Other Ambulatory Visit: Payer: Self-pay | Admitting: Internal Medicine

## 2014-12-30 DIAGNOSIS — B3731 Acute candidiasis of vulva and vagina: Secondary | ICD-10-CM

## 2014-12-30 DIAGNOSIS — B373 Candidiasis of vulva and vagina: Secondary | ICD-10-CM

## 2014-12-30 NOTE — Telephone Encounter (Signed)
Daughter states pt has recurring yeast infection and her doctor is awared. The pharmacy had informed her , pt has no refills left. I told her , Dr Josem KaufmannKlima is not in the clinic at this time; allowed 24-48hrs to refill any medications. Refilled 11/4 x 1 refill. States her mother has been eating foods she probably should not like ice cream.

## 2014-12-30 NOTE — Telephone Encounter (Signed)
Pt daughter called requesting yeast infection med to be filled @ [facility] on [address].

## 2014-12-30 NOTE — Telephone Encounter (Signed)
Called pt's daughter for more details - no answer; message left.

## 2014-12-30 NOTE — Telephone Encounter (Signed)
Pt daughter requesting the nurse to call back. °

## 2014-12-31 NOTE — Telephone Encounter (Signed)
Refilled

## 2015-01-13 DIAGNOSIS — M170 Bilateral primary osteoarthritis of knee: Secondary | ICD-10-CM | POA: Diagnosis not present

## 2015-01-20 DIAGNOSIS — M1712 Unilateral primary osteoarthritis, left knee: Secondary | ICD-10-CM | POA: Diagnosis not present

## 2015-01-20 DIAGNOSIS — M1711 Unilateral primary osteoarthritis, right knee: Secondary | ICD-10-CM | POA: Diagnosis not present

## 2015-01-25 DIAGNOSIS — M25562 Pain in left knee: Secondary | ICD-10-CM | POA: Diagnosis not present

## 2015-01-25 DIAGNOSIS — M25561 Pain in right knee: Secondary | ICD-10-CM | POA: Diagnosis not present

## 2015-01-26 DIAGNOSIS — M25561 Pain in right knee: Secondary | ICD-10-CM | POA: Diagnosis not present

## 2015-01-26 DIAGNOSIS — M25562 Pain in left knee: Secondary | ICD-10-CM | POA: Diagnosis not present

## 2015-01-27 DIAGNOSIS — M1711 Unilateral primary osteoarthritis, right knee: Secondary | ICD-10-CM | POA: Diagnosis not present

## 2015-01-27 DIAGNOSIS — M1712 Unilateral primary osteoarthritis, left knee: Secondary | ICD-10-CM | POA: Diagnosis not present

## 2015-01-29 ENCOUNTER — Other Ambulatory Visit: Payer: Self-pay | Admitting: Pharmacist

## 2015-01-29 DIAGNOSIS — I483 Typical atrial flutter: Secondary | ICD-10-CM

## 2015-01-29 MED ORDER — APIXABAN 5 MG PO TABS: 5.0000 mg | ORAL_TABLET | Freq: Two times a day (BID) | ORAL | Status: DC

## 2015-01-29 NOTE — Telephone Encounter (Signed)
[name] is a 75 y.o. female who was contacted via telephone for monitoring of apixaban (Eliquis) therapy.    ASSESSMENT Indication(s): atrial flutter Duration: indefinite  Labs:    Component Value Date/Time   AST 23 03/15/2014 2158   ALT 21 03/15/2014 2158   NA 139 11/06/2014 1041   NA 138 05/22/2014 1202   K 3.9 11/06/2014 1041   CL 98 11/06/2014 1041   CO2 24 11/06/2014 1041   GLUCOSE 86 11/06/2014 1041   GLUCOSE 89 05/22/2014 1202   HGBA1C 5.6 03/16/2014 0616   BUN 8 11/06/2014 1041   BUN 14 05/22/2014 1202   CREATININE 0.63 11/06/2014 1041   CREATININE 0.70 05/22/2014 1202   CALCIUM 9.9 11/06/2014 1041   GFRAA 102 11/06/2014 1041   GFRAA >89 05/22/2014 1202   WBC 10.5 05/05/2014 1247   HGB 10.8* 05/05/2014 1247   HCT 33.7* 05/05/2014 1247   PLT 390.0 05/05/2014 1247   apixaban (Eliquis) Dose: 5 mg BID  Safety: patient reports no recent signs or symptoms of bleeding, no signs of symptoms of thromboembolism. Medication changes: no.  Adherence: patient does not report adherence challenges but states the copay is a little high for her. Discussed risks vs benefits of therapy and patient would like to stay on this medication. I did submit a free 30 day voucher to assist patient. She was advised to contact me if there are ongoing concerns with medication cost. Patient has run out of refills (pharmacy was contacted and records indicate consistent refill history).  Patient Instructions: Patient advised to contact clinic or seek medical attention if signs/symptoms of bleeding or thromboembolism occur. Patient verbalized understanding by repeating back information.  Follow-up No Follow-up on file.  Tennile Styles J Clinical Pharmacist 01/29/2015, 9:54 AM

## 2015-02-01 DIAGNOSIS — M25561 Pain in right knee: Secondary | ICD-10-CM | POA: Diagnosis not present

## 2015-02-01 DIAGNOSIS — M25562 Pain in left knee: Secondary | ICD-10-CM | POA: Diagnosis not present

## 2015-02-02 ENCOUNTER — Other Ambulatory Visit: Payer: Self-pay | Admitting: Cardiology

## 2015-02-02 DIAGNOSIS — M25562 Pain in left knee: Secondary | ICD-10-CM | POA: Diagnosis not present

## 2015-02-02 DIAGNOSIS — M25561 Pain in right knee: Secondary | ICD-10-CM | POA: Diagnosis not present

## 2015-02-03 DIAGNOSIS — M1711 Unilateral primary osteoarthritis, right knee: Secondary | ICD-10-CM | POA: Diagnosis not present

## 2015-02-03 DIAGNOSIS — M1712 Unilateral primary osteoarthritis, left knee: Secondary | ICD-10-CM | POA: Diagnosis not present

## 2015-02-04 ENCOUNTER — Other Ambulatory Visit: Payer: Self-pay | Admitting: Internal Medicine

## 2015-02-04 DIAGNOSIS — B373 Candidiasis of vulva and vagina: Secondary | ICD-10-CM

## 2015-02-04 DIAGNOSIS — B3731 Acute candidiasis of vulva and vagina: Secondary | ICD-10-CM

## 2015-02-05 ENCOUNTER — Ambulatory Visit (INDEPENDENT_AMBULATORY_CARE_PROVIDER_SITE_OTHER): Payer: Medicare Other | Admitting: Internal Medicine

## 2015-02-05 ENCOUNTER — Encounter: Payer: Self-pay | Admitting: Internal Medicine

## 2015-02-05 VITALS — BP 140/87 | HR 66 | Temp 98.0°F | Ht 64.0 in | Wt 179.3 lb

## 2015-02-05 DIAGNOSIS — L900 Lichen sclerosus et atrophicus: Secondary | ICD-10-CM

## 2015-02-05 DIAGNOSIS — N904 Leukoplakia of vulva: Secondary | ICD-10-CM | POA: Insufficient documentation

## 2015-02-05 DIAGNOSIS — N952 Postmenopausal atrophic vaginitis: Secondary | ICD-10-CM

## 2015-02-05 DIAGNOSIS — N898 Other specified noninflammatory disorders of vagina: Secondary | ICD-10-CM | POA: Diagnosis not present

## 2015-02-05 NOTE — Progress Notes (Signed)
Patient ID: [name], female   DOB: Nov 04, 1940, 75 y.o.   MRN: [id number]   Subjective:   Patient ID: [name] female   DOB: 1940-01-07 75 y.o.   MRN: [id number]  HPI: [name] is a 75 y.o. female with PMH as below, here for f/u vaginal itching.  Please see Problem-Based charting for the status of the patient's chronic medical issues.  Patient reports she has had vaginal itching for a long time.  She was previously effectively treated with Premarin cream, but the medication became too expensive.  She was then managing with Monistat. She has recently been taking Fluconazole every 3 days since December without relief.  She recently put rubbing alcohol on her vagina because it was itching so bad, which then caused to burn.  She denies vaginal discharge or odor.  Her vagina has bled 2/2 scratching.  She denies white or shiny color to her vagina, but thinks that it may have scarred 2/2 her chronic scratching.   Past Medical History  Diagnosis Date  . Essential hypertension   . Osteoarthritis     knees and hands  . Systolic murmur     per patient report she has had rheumatic fever in childhood  . Osteoporosis, post-menopausal 2010    left femur T -2.7, left forearm T -3.5  . Blood transfusion without reported diagnosis     1960's, unknown reason  . Post-menopausal atrophic vaginitis   . Obesity, Class I, BMI 30-34.9   . Anemia 03/16/2014  . Cricopharyngeal achalasia 04/08/2014    Rigid esophagoscopy, dilation, and Botox injection into cricopharyngeus muscle recommended   . Chronic rheumatic heart disease 05/05/2014  . Mitral valve regurgitation, rheumatic 05/05/2014    Moderate to severe with peak PA pressure of 56 mmHg on Echo 04/2014   . Atrial flutter (HCC) 05/07/2014    With controlled ventricular rate on no AV nodal agents    Current Outpatient Prescriptions  Medication Sig Dispense Refill  . amLODipine (NORVASC) 10 MG tablet Take 1 tablet (10 mg total) by mouth daily. 90  tablet 3  . apixaban (ELIQUIS) 5 MG TABS tablet Take 1 tablet (5 mg total) by mouth 2 (two) times daily. 60 tablet 11  . calcium carbonate (TUMS) 500 MG chewable tablet Chew 1 tablet (200 mg of elemental calcium total) by mouth daily.    . fluconazole (DIFLUCAN) 150 MG tablet TAKE 1 TABLET(150 MG) BY MOUTH EVERY 3 DAYS FOR 3 DOSES 3 tablet 0  . furosemide (LASIX) 20 MG tablet TAKE 1 TABLET(20 MG) BY MOUTH DAILY AS NEEDED FOR FLUID RETENTION 30 tablet 10  . guaiFENesin (ROBITUSSIN) 100 MG/5ML liquid Take 100 mg by mouth 4 (four) times daily as needed for cough (for cough).    . pantoprazole (PROTONIX) 40 MG tablet TAKE 1 TABLET(40 MG) BY MOUTH DAILY 30 tablet 11  . sorbitol 70 % solution Take 15-60 mLs by mouth daily as needed (1 tablespoon in coffee each morning (may slowly adjust up to 5 tablespoons daily)). 473 mL 0  . traMADol (ULTRAM) 50 MG tablet Take 2 tablets (100 mg total) by mouth every 6 (six) hours as needed for severe pain (pain). 180 tablet 5   No current facility-administered medications for this visit.   Family History  Problem Relation Age of Onset  . Cancer Mother     Unknown type  . Stroke Father   . Unexplained death Sister   . Other Brother     Unknown health  .  Healthy Daughter   . Healthy Son   . Asthma Sister   . Other Sister     Unknown health  . Other Brother     Unknown health  . Other Brother     Unknown health  . Cancer Brother     Unknown type  . Cancer Brother     Unknown type  . Other Brother     Accident  . Other Brother     Garment/textile technologist  . Healthy Daughter   . Healthy Daughter    Social History   Social History  . Marital Status: Married    Spouse Name: N/A  . Number of Children: N/A  . Years of Education: N/A   Social History Main Topics  . Smoking status: Never Smoker   . Smokeless tobacco: Never Used  . Alcohol Use: No  . Drug Use: No  . Sexual Activity: No   Other Topics Concern  . 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   Review of Systems: Pertinent items are noted in HPI. Objective:  Physical Exam: Filed Vitals:   02/05/15 1411  BP: 140/87  Pulse: 66  Temp: 98 F (36.7 C)  TempSrc: Oral  Height:  (1.626 m)  Weight: 179 lb 4.8 oz (81.33 kg)  SpO2: 100%   Physical Exam  Constitutional: She is oriented to person, place, and time and well-developed, well-nourished, and in no distress. No distress.  HENT:  Head: Normocephalic and atraumatic.  Eyes: EOM are normal. No scleral icterus.  Neck: No tracheal deviation present.  Cardiovascular: Regular rhythm.   Grade III/VI crescendo murmur heard best at RUSB  Pulmonary/Chest: Effort normal and breath sounds normal. No stridor. No respiratory distress. She has no wheezes.  Minimal crackles in bilateral bases.  Genitourinary:  Interior labia majora with thickened white plaques and linear superficial erosions.  No odor or discharge.  Musculoskeletal: She exhibits no edema.  Neurological: She is alert and oriented to person, place, and time.  Skin: Skin is warm and dry. No rash noted. She is not diaphoretic.  No Wickam's striae appreciated in the oral mucosa.  Nails could not be visualized due to manicure.     Assessment & Plan:   Patient and case were discussed with Dr. Oswaldo Done.  Please refer to Problem Based charting for further documentation.

## 2015-02-05 NOTE — Assessment & Plan Note (Signed)
Patient complains of extreme vaginal itch without odor or discharge. She was previously treated with Premarin cream, but she has not used it recently due to cost.  Exam demonstrates thickened white plaques to interior aspects of labia majora.  Atrophic vaginitis would not explain extreme itching or thickened plaques, though plaques may be 2/2 lichen simplex chronicus in the setting of prolonged scratching.  However, due to concern for lichen sclerosus versus vulvovaginal lichen planus, will refer to dermatology for biopsy and treatment.  No other signs of LP appreciated on exam, with no Wickham's striae or rash. Nail changes could not be appreciated.  Therefore, lichen sclerosus is favored. - Referral to dermatology - Vaseline topically until evaluated by dermatology.  Will hold topical steroids so as not to alter biopsy results.

## 2015-02-05 NOTE — Patient Instructions (Signed)
1. Apply vaseline topically as necessary 2. Do your best not to scratch. 3. Follow up with Dermatology as soon as possible.  Lichen Sclerosus Lichen sclerosus is a skin problem. It can happen on any part of the body, but it commonly involves the anal or genital areas. It can cause itching and discomfort in these areas. Treatment can help to control symptoms. When the genital area is affected, getting treatment is important because the condition can cause scarring that may lead to other problems. CAUSES The cause of this condition is not known. It could be the result of an overactive immune system or a lack of certain hormones. Lichen sclerosus is not an infection or a fungus. It is not passed from one person to another (not contagious). RISK FACTORS This condition is more likely to develop in women, usually after menopause. SYMPTOMS Symptoms of this condition include:  Thin, wrinkled, white areas on the skin.  Thickened white areas on the skin.  Red and swollen patches (lesions) on the skin.  Tears or cracks in the skin.  Bruising.  Blood blisters.  Severe itching. You may also have pain, itching, or burning with urination. Constipation is also common in people with lichen sclerosus. DIAGNOSIS This condition may be diagnosed with a physical exam. In some cases, a tissue sample (biopsy sample) may be removed to be looked at under a microscope. TREATMENT This condition is usually treated with medicated creams or ointments (topical steroids) that are applied over the affected areas. HOME CARE INSTRUCTIONS  Take over-the-counter and prescription medicines only as told by your health care provider.  Use creams or ointments as told by your health care provider.  Do not scratch the affected areas of skin.  Women should keep the vaginal area as clean and dry as possible.  Keep all follow-up visits as told by your health care provider. This is important. SEEK MEDICAL CARE IF:  You  have increasing redness, swelling, or pain in the affected area.  You have fluid, blood, or pus coming from the affected area.  You have new lesions on your skin.  You have pain or burning with urination.  You have pain during sex.   This information is not intended to replace advice given to you by your health care provider. Make sure you discuss any questions you have with your health care provider.   Document Released: 05/11/2010 Document Revised: 09/09/2014 Document Reviewed: 03/16/2014 Elsevier Interactive Patient Education [employer]

## 2015-02-10 DIAGNOSIS — M1711 Unilateral primary osteoarthritis, right knee: Secondary | ICD-10-CM | POA: Diagnosis not present

## 2015-02-10 DIAGNOSIS — M1712 Unilateral primary osteoarthritis, left knee: Secondary | ICD-10-CM | POA: Diagnosis not present

## 2015-02-10 NOTE — Progress Notes (Signed)
Internal Medicine Clinic Attending  Case discussed with Dr. Taylor at the time of the visit.  We reviewed the resident's history and exam and pertinent patient test results.  I agree with the assessment, diagnosis, and plan of care documented in the resident's note. 

## 2015-02-12 DIAGNOSIS — L308 Other specified dermatitis: Secondary | ICD-10-CM | POA: Diagnosis not present

## 2015-02-16 ENCOUNTER — Ambulatory Visit (INDEPENDENT_AMBULATORY_CARE_PROVIDER_SITE_OTHER): Payer: Medicare Other | Admitting: Nurse Practitioner

## 2015-02-16 ENCOUNTER — Encounter: Payer: Self-pay | Admitting: Nurse Practitioner

## 2015-02-16 VITALS — BP 128/68 | HR 60 | Ht 64.0 in | Wt 177.8 lb

## 2015-02-16 DIAGNOSIS — I481 Persistent atrial fibrillation: Secondary | ICD-10-CM

## 2015-02-16 DIAGNOSIS — I051 Rheumatic mitral insufficiency: Secondary | ICD-10-CM | POA: Diagnosis not present

## 2015-02-16 DIAGNOSIS — Z79899 Other long term (current) drug therapy: Secondary | ICD-10-CM | POA: Diagnosis not present

## 2015-02-16 DIAGNOSIS — I4819 Other persistent atrial fibrillation: Secondary | ICD-10-CM

## 2015-02-16 LAB — HEPATIC FUNCTION PANEL
ALT: 14 U/L (ref 6–29)
AST: 19 U/L (ref 10–35)
Albumin: 3.7 g/dL (ref 3.6–5.1)
Alkaline Phosphatase: 91 U/L (ref 33–130)
Bilirubin, Direct: 0.1 mg/dL (ref <=0.2)
Indirect Bilirubin: 0.4 mg/dL (ref 0.2–1.2)
Total Bilirubin: 0.5 mg/dL (ref 0.2–1.2)
Total Protein: 6.9 g/dL (ref 6.1–8.1)

## 2015-02-16 LAB — BASIC METABOLIC PANEL
BUN: 10 mg/dL (ref 7–25)
CO2: 28 mmol/L (ref 20–31)
Calcium: 9.2 mg/dL (ref 8.6–10.4)
Chloride: 103 mmol/L (ref 98–110)
Creat: 0.77 mg/dL (ref 0.60–0.93)
Glucose, Bld: 102 mg/dL — ABNORMAL HIGH (ref 65–99)
Potassium: 3.3 mmol/L — ABNORMAL LOW (ref 3.5–5.3)
Sodium: 138 mmol/L (ref 135–146)

## 2015-02-16 LAB — CBC
HCT: 39.3 % (ref 36.0–46.0)
Hemoglobin: 12.9 g/dL (ref 12.0–15.0)
MCH: 26.9 pg (ref 26.0–34.0)
MCHC: 32.8 g/dL (ref 30.0–36.0)
MCV: 81.9 fL (ref 78.0–100.0)
MPV: 10.8 fL (ref 8.6–12.4)
Platelets: 285 10*3/uL (ref 150–400)
RBC: 4.8 MIL/uL (ref 3.87–5.11)
RDW: 16.2 % — ABNORMAL HIGH (ref 11.5–15.5)
WBC: 8.5 10*3/uL (ref 4.0–10.5)

## 2015-02-16 LAB — LIPID PANEL
Cholesterol: 157 mg/dL (ref 125–200)
HDL: 47 mg/dL (ref >=46)
LDL Cholesterol: 96 mg/dL (ref <130)
Total CHOL/HDL Ratio: 3.3 Ratio (ref <=5.0)
Triglycerides: 69 mg/dL (ref <150)
VLDL: 14 mg/dL (ref <30)

## 2015-02-16 NOTE — Progress Notes (Addendum)
CARDIOLOGY OFFICE NOTE  Date:  02/16/2015    [name] Date of Birth: 1940-01-10 Medical Record #[id number]  PCP:  Rocco Serene, MD  Cardiologist:  Patty Sermons    Chief Complaint  Patient presents with  . Atrial Fibrillation  . Hypertension  . Cardiac Valve Problem    4 month check - seen for Dr. Patty Sermons    History of Present Illness: [name] is a 75 y.o. female who presents today for a 4 month check. Seen for Dr. Patty Sermons.   She has a history of chronic atrial fib, HTN, OA, rheumatic fever with MV regurgitation and anemia. She is on Eliquis for her anticoagulation. No beta blocker due to bradycardia.   She was found to be in atrial flutter when she presented for a surgical procedure at [facility] back on 04/13/2014.She had had a previous EKG on 03/16/14 which showed that she was in normal sinus rhythm at that time. She was cleared for the surgery and went on to have her surgical procedure without incident. She was found to have cricopharyngeal muscle hypertrophy - was treated with Botox.  Last seen in October and felt to be doing ok. Continued with rate control management and anticoagulation.   Comes back today. Here with her daughter. Notes she is tired. 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 Recently cut back to part time hours and thinking about stopping altogether. No chest pain. Breathing is ok. Still pretty limited by her arthritis. No syncope. Not dizzy. Needs labs today. No bleeding, bruising or falls. Tolerating her medicines.   Past Medical History  Diagnosis Date  . Essential hypertension   . Osteoarthritis     knees and hands  . Systolic murmur     per patient report she has had rheumatic fever in childhood  . Osteoporosis, post-menopausal 2010    left femur T -2.7, left forearm T -3.5  . Blood transfusion without reported diagnosis     1960's, unknown reason  .  Post-menopausal atrophic vaginitis   . Obesity, Class I, BMI 30-34.9   . Anemia 03/16/2014  . Cricopharyngeal achalasia 04/08/2014    Rigid esophagoscopy, dilation, and Botox injection into cricopharyngeus muscle recommended   . Chronic rheumatic heart disease 05/05/2014  . Mitral valve regurgitation, rheumatic 05/05/2014    Moderate to severe with peak PA pressure of 56 mmHg on Echo 04/2014   . Atrial flutter (HCC) 05/07/2014    With controlled ventricular rate on no AV nodal agents     Past Surgical History  Procedure Laterality Date  . Abdominal hysterectomy      Fibroids  . Cyst excision      on hand  . Colonoscopy w/ biopsies and polypectomy    . Multiple tooth extractions    . Esophagoscopy with dilitation N/A 04/14/2014    Procedure: RIGID ESOPHAGOSCOPY WITH DILITATION AND BOTOX  INJECTION;  Surgeon: Osborn Coho, MD;  Location: [facility] OR;  Service: ENT;  Laterality: N/A;     Medications: Current Outpatient Prescriptions  Medication Sig Dispense Refill  . amLODipine (NORVASC) 10 MG tablet Take 1 tablet (10 mg total) by mouth daily. 90 tablet 3  . apixaban (ELIQUIS) 5 MG TABS tablet Take 1 tablet (5 mg total) by mouth 2 (two) times daily. 60 tablet 11  . calcium carbonate (TUMS) 500 MG chewable tablet Chew 1 tablet (200 mg of elemental calcium  total) by mouth daily.    . clobetasol ointment (TEMOVATE) 0.05 % Apply 1 application topically 2 (two) times daily.     . fluconazole (DIFLUCAN) 150 MG tablet TAKE 1 TABLET(150 MG) BY MOUTH EVERY 3 DAYS FOR 3 DOSES 3 tablet 0  . furosemide (LASIX) 20 MG tablet TAKE 1 TABLET(20 MG) BY MOUTH DAILY AS NEEDED FOR FLUID RETENTION 30 tablet 10  . guaiFENesin (ROBITUSSIN) 100 MG/5ML liquid Take 100 mg by mouth 4 (four) times daily as needed for cough (for cough).    . pantoprazole (PROTONIX) 40 MG tablet TAKE 1 TABLET(40 MG) BY MOUTH DAILY 30 tablet 11  . sorbitol 70 % solution Take 15-60 mLs by mouth daily as needed (1 tablespoon in coffee each morning  (may slowly adjust up to 5 tablespoons daily)). 473 mL 0  . traMADol (ULTRAM) 50 MG tablet Take 2 tablets (100 mg total) by mouth every 6 (six) hours as needed for severe pain (pain). 180 tablet 5   No current facility-administered medications for this visit.    Allergies: Allergies  Allergen Reactions  . Ace Inhibitors Other (See Comments)    cough    Social History: The patient  reports that she has never smoked. She has never used smokeless tobacco. She reports that she does not drink alcohol or use illicit drugs.   Family History: The patient's family history includes Asthma in her sister; Cancer in her brother, brother, and mother; Healthy in her daughter, daughter, daughter, and son; Other in her brother, brother, brother, brother, brother, and sister; Stroke in her father; Unexplained death in her sister.   Review of Systems: Please see the history of present illness.   Otherwise, the review of systems is positive for none.   All other systems are reviewed and negative.   Physical Exam: VS:  BP 128/68 mmHg  Pulse 60  Ht 5\' 4"  (1.626 m)  Wt 177 lb 12.8 oz (80.65 kg)  BMI 30.50 kg/m2 .  BMI Body mass index is 30.5 kg/(m^2).  Wt Readings from Last 3 Encounters:  02/16/15 177 lb 12.8 oz (80.65 kg)  02/05/15 179 lb 4.8 oz (81.33 kg)  11/06/14 183 lb (83.008 kg)    General: Pleasant. Well developed, well nourished and in no acute distress. Her weight is down 6 pounds since November.  HEENT: Normal. Neck: Supple, no JVD, carotid bruits, or masses noted.  Cardiac: Regular rate and rhythm. Holosystolic murmur. Trace edema.  Respiratory:  Lungs are clear to auscultation bilaterally with normal work of breathing.  GI: Soft and nontender.  MS: No deformity or atrophy. Gait and ROM intact but looks unsteady. Skin: Warm and dry. Color is normal.  Neuro:  Strength and sensation are intact and no gross focal deficits noted.  Psych: Alert, appropriate and with normal  affect.   LABORATORY DATA:  EKG:  EKG is not ordered today.   Lab Results  Component Value Date   WBC 10.5 05/05/2014   HGB 10.8* 05/05/2014   HCT 33.7* 05/05/2014   PLT 390.0 05/05/2014   GLUCOSE 86 11/06/2014   CHOL 129 03/16/2014   TRIG 70 03/16/2014   HDL 36* 03/16/2014   LDLCALC 79 03/16/2014   ALT 21 03/15/2014   AST 23 03/15/2014   NA 139 11/06/2014   K 3.9 11/06/2014   CL 98 11/06/2014   CREATININE 0.63 11/06/2014   BUN 8 11/06/2014   CO2 24 11/06/2014   TSH 3.297 03/16/2014   INR 1.02 03/15/2014   HGBA1C  5.6 03/16/2014    BNP (last 3 results)  Recent Labs  03/15/14 2158  BNP 166.0*    ProBNP (last 3 results) No results for input(s): PROBNP in the last 8760 hours.   Other Studies Reviewed Today:  Echo Study Conclusions from 04/2014  - Left ventricle: The cavity size was normal. Wall thickness was increased in a pattern of mild LVH. There was mild focal basal hypertrophy of the septum. Systolic function was normal. The estimated ejection fraction was in the range of 55% to 60%. Wall motion was normal; there were no regional wall motion abnormalities. - Aortic valve: There was mild stenosis. - Mitral valve: Calcified annulus. Mildly thickened leaflets . There was moderate to severe regurgitation. - Left atrium: The atrium was severely dilated. - Atrial septum: There was a patent foramen ovale. - Pulmonary arteries: Systolic pressure was moderately to severely increased. PA peak pressure: 56 mm Hg (S).  Impressions:  - Normal LV function; severe LAE; mild AS; moderate to severe MR; mild TR; moderate to severe elevation in pulmonary pressure.   Assessment/Plan: 1. PAF/flutter - managed with rate control and anticoagulation  2. Chronic anticoagulation - no problems noted.   3. HTN - BP ok on her current regimen. We will continue.   4. Rheumatic heart disease with MR - needs echo updated in April  5. Fatigue - most likely  multifactorial - needs labs today.   Current medicines are reviewed with the patient today.  The patient does not have concerns regarding medicines other than what has been noted above.  The following changes have been made:  See above.  Labs/ tests ordered today include:    Orders Placed This Encounter  Procedures  . Basic metabolic panel  . CBC  . Hepatic function panel  . Lipid panel  . TSH  . ECHOCARDIOGRAM COMPLETE     Disposition:   FU with me in May.    Patient is agreeable to this plan and will call if any problems develop in the interim.   Signed: Rosalio Macadamia, RN, ANP-C 02/16/2015 3:07 PM  [facility] [address] [place], [place]  [address] Phone: [phone] Fax: [phone]        Addendum 03/30/2015 : Needing clearance for knee surgery. At the time of my visit - she was felt to be stable with no chest pain and was not short of breath. 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 Echo is pending for later in April.  She is felt to be low risk for surgery. Her Eliquis had already been addressed by Dr. Patty Sermons for surgery.   Rosalio Macadamia, RN, ANP-C [facility] [address] [place], [place]  [address] [phone]

## 2015-02-16 NOTE — Patient Instructions (Addendum)
We will be checking the following labs today - BMET, CBC, TSH, HPF and Lipids   Medication Instructions:    Continue with your current medicines.     Testing/Procedures To Be Arranged:  Echocardiogram in April  Follow-Up:   See me in May for a visit with EKG    Other Special Instructions:   N/A    If you need a refill on your cardiac medications before your next appointment, please call your pharmacy.   Call the [facility] office at [phone] if you have any questions, problems or concerns.

## 2015-02-17 ENCOUNTER — Other Ambulatory Visit: Payer: Self-pay | Admitting: *Deleted

## 2015-02-17 ENCOUNTER — Encounter: Payer: Self-pay | Admitting: Cardiology

## 2015-02-17 DIAGNOSIS — E876 Hypokalemia: Secondary | ICD-10-CM

## 2015-02-17 LAB — TSH: TSH: 2.97 mIU/L

## 2015-02-17 MED ORDER — POTASSIUM CHLORIDE ER 10 MEQ PO TBCR: 10.0000 meq | EXTENDED_RELEASE_TABLET | Freq: Every day | ORAL | Status: DC

## 2015-02-18 ENCOUNTER — Encounter: Payer: Self-pay | Admitting: Cardiology

## 2015-02-19 DIAGNOSIS — M170 Bilateral primary osteoarthritis of knee: Secondary | ICD-10-CM | POA: Diagnosis not present

## 2015-02-19 DIAGNOSIS — M1711 Unilateral primary osteoarthritis, right knee: Secondary | ICD-10-CM | POA: Diagnosis not present

## 2015-03-03 DIAGNOSIS — M25461 Effusion, right knee: Secondary | ICD-10-CM | POA: Diagnosis not present

## 2015-03-03 DIAGNOSIS — M170 Bilateral primary osteoarthritis of knee: Secondary | ICD-10-CM | POA: Diagnosis not present

## 2015-03-03 DIAGNOSIS — M1712 Unilateral primary osteoarthritis, left knee: Secondary | ICD-10-CM | POA: Diagnosis not present

## 2015-03-05 ENCOUNTER — Other Ambulatory Visit: Payer: Medicare Other

## 2015-03-07 ENCOUNTER — Encounter: Payer: Self-pay | Admitting: Cardiology

## 2015-03-08 ENCOUNTER — Other Ambulatory Visit (INDEPENDENT_AMBULATORY_CARE_PROVIDER_SITE_OTHER): Payer: Medicare Other | Admitting: *Deleted

## 2015-03-08 DIAGNOSIS — E876 Hypokalemia: Secondary | ICD-10-CM

## 2015-03-08 LAB — BASIC METABOLIC PANEL
BUN: 12 mg/dL (ref 7–25)
CO2: 25 mmol/L (ref 20–31)
Calcium: 9.1 mg/dL (ref 8.6–10.4)
Chloride: 103 mmol/L (ref 98–110)
Creat: 0.98 mg/dL — ABNORMAL HIGH (ref 0.60–0.93)
Glucose, Bld: 114 mg/dL — ABNORMAL HIGH (ref 65–99)
Potassium: 3.4 mmol/L — ABNORMAL LOW (ref 3.5–5.3)
Sodium: 137 mmol/L (ref 135–146)

## 2015-03-09 ENCOUNTER — Telehealth: Payer: Self-pay | Admitting: *Deleted

## 2015-03-09 ENCOUNTER — Other Ambulatory Visit: Payer: Self-pay | Admitting: *Deleted

## 2015-03-09 DIAGNOSIS — E876 Hypokalemia: Secondary | ICD-10-CM

## 2015-03-09 MED ORDER — POTASSIUM CHLORIDE ER 10 MEQ PO TBCR: 10.0000 meq | EXTENDED_RELEASE_TABLET | Freq: Two times a day (BID) | ORAL | Status: DC

## 2015-03-09 NOTE — Telephone Encounter (Signed)
Received my chart message regarding upcoming surgery and Eliquis Left message to call back

## 2015-03-10 NOTE — Telephone Encounter (Signed)
Left message to call back  

## 2015-03-10 NOTE — Telephone Encounter (Signed)
Follow up      Returning a call to the nurse.  Daughter is at lunch until 2pm--then she will not be available.

## 2015-03-24 ENCOUNTER — Telehealth: Payer: Self-pay | Admitting: Licensed Clinical Social Worker

## 2015-03-24 NOTE — Telephone Encounter (Signed)
CSW rec'vd voice mail from patient's daughter requesting services for mother once mother returns home following surgery.  CSW returned call to [name].  Daughter states she works full time and is concerned regarding patient's meal prep and bathing once she returns home.  Pt is schedule for surgery early April.  Daughter states plan is for patient to d/c to SNF following surgery.  CSW discussed skilled care vs custodial care with pt's current insurance.  Discussed services [facility] can provide as an "add-on" service if patient received HH RN or HH PT services.  Daughter reassured SNF will assist with d/c planning and custodial care is available as private pay.  [name] informed pt will need to a [facility] to [facility] to [facility] ordering in-home skilled services.  Daughter denied add'l social work needs at this time.

## 2015-03-26 ENCOUNTER — Encounter (HOSPITAL_COMMUNITY): Payer: Self-pay

## 2015-03-26 ENCOUNTER — Encounter (HOSPITAL_COMMUNITY)
Admission: RE | Admit: 2015-03-26 | Discharge: 2015-03-26 | Disposition: A | Discharge status: Home / Self Care | Payer: Medicare Other | Source: Ambulatory Visit | Attending: Orthopaedic Surgery | Admitting: Orthopaedic Surgery

## 2015-03-26 DIAGNOSIS — I272 Other secondary pulmonary hypertension: Secondary | ICD-10-CM | POA: Diagnosis not present

## 2015-03-26 DIAGNOSIS — I080 Rheumatic disorders of both mitral and aortic valves: Secondary | ICD-10-CM | POA: Diagnosis not present

## 2015-03-26 DIAGNOSIS — Z01812 Encounter for preprocedural laboratory examination: Secondary | ICD-10-CM | POA: Diagnosis not present

## 2015-03-26 DIAGNOSIS — I4892 Unspecified atrial flutter: Secondary | ICD-10-CM | POA: Diagnosis not present

## 2015-03-26 DIAGNOSIS — I10 Essential (primary) hypertension: Secondary | ICD-10-CM | POA: Insufficient documentation

## 2015-03-26 DIAGNOSIS — M1712 Unilateral primary osteoarthritis, left knee: Secondary | ICD-10-CM | POA: Diagnosis not present

## 2015-03-26 DIAGNOSIS — Z7901 Long term (current) use of anticoagulants: Secondary | ICD-10-CM | POA: Insufficient documentation

## 2015-03-26 DIAGNOSIS — Z79899 Other long term (current) drug therapy: Secondary | ICD-10-CM | POA: Insufficient documentation

## 2015-03-26 DIAGNOSIS — Z01818 Encounter for other preprocedural examination: Secondary | ICD-10-CM | POA: Insufficient documentation

## 2015-03-26 HISTORY — DX: Other specified postprocedural states: R11.2

## 2015-03-26 HISTORY — DX: Other specified postprocedural states: Z98.890

## 2015-03-26 LAB — BASIC METABOLIC PANEL
Anion gap: 12 (ref 5–15)
BUN: 9 mg/dL (ref 4–21)
BUN: 9 mg/dL (ref 6–20)
CO2: 22 mmol/L (ref 22–32)
CREATININE: 0.7 mg/dL (ref 0.5–1.1)
CREATININE: 0.71 mg/dL (ref 0.44–1.00)
Calcium: 9.4 mg/dL (ref 8.9–10.3)
Chloride: 104 mmol/L (ref 101–111)
GFR calc Af Amer: >60 mL/min (ref >60)
Glucose, Bld: 105 mg/dL — ABNORMAL HIGH (ref 65–99)
Glucose: 105 mg/dL
Potassium: 3.5 mmol/L (ref 3.5–5.1)
SODIUM: 138 mmol/L (ref 135–145)
Sodium: 138 mmol/L (ref 137–147)

## 2015-03-26 LAB — CBC
HCT: 43.4 % (ref 36.0–46.0)
Hemoglobin: 14 g/dL (ref 12.0–15.0)
MCH: 27.7 pg (ref 26.0–34.0)
MCHC: 32.3 g/dL (ref 30.0–36.0)
MCV: 85.9 fL (ref 78.0–100.0)
PLATELETS: 244 10*3/uL (ref 150–400)
RBC: 5.05 MIL/uL (ref 3.87–5.11)
RDW: 14.4 % (ref 11.5–15.5)
WBC: 9.3 10*3/uL (ref 4.0–10.5)

## 2015-03-26 LAB — SURGICAL PCR SCREEN
MRSA, PCR: NEGATIVE
STAPHYLOCOCCUS AUREUS: POSITIVE — AB

## 2015-03-26 NOTE — Progress Notes (Addendum)
A-fib discovered prior to her esophageal dilatation.  She had seen Dr. Patty SermonsBrackbill in 02/2015.  Denies any chest pains, sob. Has had echo 04/2014. PCP is Dr[facility] Jose[facility]Klima [facility]  11/2014 Last dose of Eloquis will be March 31st.  No orders @ PAT.  Left VM for Ms. Blume.

## 2015-03-26 NOTE — Progress Notes (Signed)
I called a prescription for Mupirocin ointment to [facility], [address], , [place] 

## 2015-03-26 NOTE — Pre-Procedure Instructions (Signed)
[name]  03/26/2015      [facility] [id number] [name] [address] [facility] [address] [facility] [name] [place] [address] Phone: [phone] Fax: [phone]    Your procedure is scheduled on April 4th, Tuesday   [facility] to [facility] Admitting at 11:00 AM             (posted surgery time 12:52 pm - 3:07 pm)   Call this number if you have problems the morning of surgery:  [phone]   Remember:  Do not eat food or drink liquids after midnight Monday.   Take these medicines the morning of surgery with A SIP OF WATER : Protonix, Tramadol, Norvasc   Do not wear jewelry, make-up or nail polish.  Do not wear lotions, powders, or perfumes.     Do not shave 48 hours prior to surgery.    Do not bring valuables to the hospital.  [facility] is not responsible for any belongings or valuables.  Contacts, dentures or bridgework may not be worn into surgery.  Leave your suitcase in the car.  After surgery it may be brought to your room. For patients admitted to the hospital, discharge time will be determined by your treatment team.   Name and phone number of your driver:      Please read over the following fact sheets that you were given. Pain Booklet, Coughing and Deep Breathing, MRSA Information and Surgical Site Infection Prevention

## 2015-03-29 ENCOUNTER — Other Ambulatory Visit: Payer: Self-pay | Admitting: Orthopaedic Surgery

## 2015-03-29 ENCOUNTER — Other Ambulatory Visit: Payer: Self-pay | Admitting: Orthopedic Surgery

## 2015-03-29 NOTE — Progress Notes (Addendum)
Anesthesia Chart Review: Patient is a 75 year old female scheduled for left TKA on 04/06/15 by Dr. Jerl Santosalldorf.  History includes non-smoker, post-operative N/V, HTN, rheumatic fever, moderate-severe MR with moderate-severe pulmonary hypertension and mild AS 04/2014 echo, aflutter 04/2014, anemia, hiatal hernia (small by 03/26/14 barium swallow), dysphagia with cricopharyngeal muscle hypertrophy achalasia s/p Botox 04/14/14 (Dr. Annalee GentaShoemaker), hysterectomy.   Hospitalization 03/2014 for N/V/D and dizziness. Presenting EKG concerning for third degree heart block then Wenckebach with labs showing hypokalemia (K 2.9) and elevated BUN (31) with normal Cr and troponin. She was discharged home on 03/16/14 after treatment with IVF. Discharge EKG showed SR with first degree AVB, LVH. She presented the following month (04/13/14) with new onset aflutter and referred to [facility]. She was initially seen by Rudi Cocoonna Carroll, NP/Dr. Johney [facility]Allred [facility]. Her intermittent heart block was attributed to illness, hypokalemia, and high dose beta-blocker at the time. Avoid b-blocker therapy and anticoagulation therapy (CHADS score 3) were recommended. Follow-up cardiology care has been through Dr. Cassell Clementhomas Brackbill, with last office visit with Norma FredricksonLori Gerhardt, NP on 02/16/15. She mentioned plans to repeat echo in 04/2015. Dr. Patty SermonsBrackbill advised to hold Eliquis 2 days prior to surgery (see Media tab).  Meds include amlodipine, Eliquis, Tums, Temovate ointment, Lasix, Protonix, K-dur, Ultram. She reported her last Eliquis dose before surgery will be on 04/02/15.   PAT Vitals: BP 135/100 (unfortunately, not rechecked; was 126/68 on 02/16/15). HR 80. RR 20. T 36.7C. O2 sat 100%.  03/26/15 EKG: Atrial flutter at 58 bpm. Non-specific T wave abnormality. She also showed atrial flutter on her prior tracing from 10/07/14.   04/13/14 Echo: Study Conclusions - Left ventricle: The cavity size was normal. Wall thickness was increased in a pattern  of mild LVH. There was mild focal basal hypertrophy of the septum. Systolic function was normal. The estimated ejection fraction was in the range of 55% to 60%. Wall motion was normal; there were no regional wall motion abnormalities. - Aortic valve: There was mild stenosis. - Mitral valve: Calcified annulus. Mildly thickened leaflets . There was moderate to severe regurgitation. - Left atrium: The atrium was severely dilated. - Atrial septum: There was a patent foramen ovale. - Pulmonary arteries: Systolic pressure was moderately to severely increased. PA peak pressure: 56 mm Hg (S). Impressions: - Normal LV function; severe LAE; mild AS; moderate to severe MR; mild TR; moderate to severe elevation in pulmonary pressure.  Preoperative labs noted. Pre-operative surgeon orders were pending at the time of her PAT appointment, so additional labs per surgeon will be done on the day of surgery (includes PT/PTT, UA, T&S).  Will reach out to cardiology regarding timing of her next echocardiogram. I'll also review with anesthesia. She is posted for "[facility]" but with mild AS on last year's echo she may need GA which could be less ideal with pulmonary hypertension history.   Velna Ochsllison Zelenak, PA-C [facility] Short [facility]/Anesthesiology Phone [phone] 03/29/2015 6:43 PM  Addendum: I have communicated with Norma FredricksonLori Gerhardt, NP. Unfortunately, echo can not be scheduled prior to 04/06/15 (currently scheduled for 04/16/15). Reviewed above with anesthesiologist Dr. Blima DessertBen Judd. Although Dr. Patty SermonsBrackbill addressed anticoagulation recommendations, perioperative risk assessment was not addressed. Would recommend that cardiology weigh in on patient's perioperative CV risk. As long as patient is not having acute CV/CHF symptoms then he would defer decision to repeat echo pre-operatively to cardiology. She will need general anesthesia due to her valvular disease. Will await additional cardiology  input and update Dr.  Dalldorf's office once more is known.  Velna Ochs [facility] Short Stay Center/Anesthesiology Phone [phone] 03/30/2015 1:52 PM  Addendum: [name], NP has added an addendum to her 02/16/15 note addressing cardiac perioperative risk. She wrote, "Needing clearance for knee surgery. At the time of my visit - she was felt to be stable with no chest pain and was not short of breath. 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 Echo is pending for later in April. She is felt to be low risk for surgery. Her Eliquis had already been addressed by Dr. Patty Sermons for surgery." Reviewed with anesthesiologist Dr. Blima Dessert. If patient without any new or progressive CV symptoms then anticipate that she can proceed as planned.  Velna Ochs [facility] Short Stay Center/Anesthesiology Phone [phone] 03/30/2015 5:02 PM

## 2015-03-30 NOTE — Addendum Note (Signed)
Addended by: Rosalio MacadamiaGERHARDT, Mary Hockey C on: 03/30/2015 04:09 PM   Modules accepted: Kipp BroodSmartSet

## 2015-04-01 ENCOUNTER — Telehealth: Payer: Self-pay | Admitting: Nurse Practitioner

## 2015-04-01 NOTE — Telephone Encounter (Signed)
New message      Calling to talk to LetcherLori.  She has questions about eliquis

## 2015-04-02 NOTE — Telephone Encounter (Signed)
Spoke with daughter and advised patient has been cleared for surgery Sent Lori G NP to Dr Dalldorf that addresses clearance 

## 2015-04-02 NOTE — Telephone Encounter (Signed)
Per [name] get Echo as scheduled

## 2015-04-02 NOTE — Telephone Encounter (Signed)
Spoke with daughter and advised patient has been cleared for surgery Sent Dawayne PatriciaLori G NP to Dr Jerl Santosalldorf that addresses clearance

## 2015-04-02 NOTE — Telephone Encounter (Signed)
S/w daughter per DPR stated has concerns about stopping pt's Eliquis stated was cleared for surgery and Eliquis was already addressed. Stated clearance has not been sent over to surgeons office.  Told pt's daughter I would call Regis BillMelinda Pratt, LPN. Who has been working on this.  S/w Juliette AlcideMelinda and will call pt's daughter this am.  Juliette AlcideMelinda will call back. I will forward this message.

## 2015-04-05 MED ORDER — LACTATED RINGERS IV SOLN
INTRAVENOUS | Status: DC
  Administered 2015-04-06 (×2): via INTRAVENOUS

## 2015-04-05 MED ORDER — CEFAZOLIN SODIUM-DEXTROSE 2-4 GM/100ML-% IV SOLN
2.0000 g | INTRAVENOUS | Status: AC
  Administered 2015-04-06: 2 g via INTRAVENOUS
  Filled 2015-04-05: qty 100

## 2015-04-05 NOTE — H&P (Signed)
TOTAL KNEE ADMISSION H&P  Patient is being admitted for right total knee arthroplasty.  Subjective:  Chief Complaint:right knee pain.  HPI: [name], 75 y.o. female, has a history of pain and functional disability in the right knee due to arthritis and has failed non-surgical conservative treatments for greater than 12 weeks to includeNSAID's and/or analgesics, corticosteriod injections, viscosupplementation injections, flexibility and strengthening excercises, use of assistive devices, weight reduction as appropriate and activity modification.  Onset of symptoms was gradual, starting 5 years ago with gradually worsening course since that time. The patient noted no past surgery on the right knee(s).  Patient currently rates pain in the right knee(s) at 10 out of 10 with activity. Patient has night pain, worsening of pain with activity and weight bearing, pain that interferes with activities of daily living, crepitus and joint swelling.  Patient has evidence of subchondral cysts, subchondral sclerosis, periarticular osteophytes and joint space narrowing by imaging studies.  There is no active infection.  Patient Active Problem List   Diagnosis Date Noted  . Lichen sclerosus 02/05/2015  . Chronic constipation 11/06/2014  . Vaginal candidiasis 11/06/2014  . Atrial flutter (HCC) 05/07/2014  . Chronic rheumatic heart disease 05/05/2014  . Mitral valve regurgitation, rheumatic 05/05/2014  . Cricopharyngeal achalasia 04/08/2014  . Normocytic anemia 03/16/2014  . Sigmoid diverticulosis 03/16/2014  . Internal hemorrhoids 03/16/2014  . Obesity, Class I, BMI 30-34.9 04/26/2012  . Postmenopausal atrophic vaginitis 05/10/2011  . Preventative health care 11/07/2010  . Osteoporosis, post-menopausal 10/21/2010  . Osteoarthritis 01/12/2006  . Essential hypertension 11/20/2005   Past Medical History  Diagnosis Date  . Essential hypertension   . Osteoarthritis     knees and hands  . Systolic  murmur     per patient report she has had rheumatic fever in childhood  . Osteoporosis, post-menopausal 2010    left femur T -2.7, left forearm T -3.5  . Blood transfusion without reported diagnosis     1960's, unknown reason  . Post-menopausal atrophic vaginitis   . Obesity, Class I, BMI 30-34.9   . Anemia 03/16/2014  . Cricopharyngeal achalasia 04/08/2014    Rigid esophagoscopy, dilation, and Botox injection into cricopharyngeus muscle recommended   . Chronic rheumatic heart disease 05/05/2014  . Mitral valve regurgitation, rheumatic 05/05/2014    Moderate to severe with peak PA pressure of 56 mmHg on Echo 04/2014   . Atrial flutter (HCC) 05/07/2014    With controlled ventricular rate on no AV nodal agents   . PONV (postoperative nausea and vomiting)     with recent dilatation of throat, had N/V    Past Surgical History  Procedure Laterality Date  . Abdominal hysterectomy      Fibroids  . Cyst excision      on hand  . Colonoscopy w/ biopsies and polypectomy    . Multiple tooth extractions    . Esophagoscopy with dilitation N/A 04/14/2014    Procedure: RIGID ESOPHAGOSCOPY WITH DILITATION AND BOTOX  INJECTION;  Surgeon: Osborn Cohoavid Shoemaker, MD;  [facility];  Service: ENT;  Laterality: N/A;    No prescriptions prior to admission   Allergies  Allergen Reactions  . Ace Inhibitors Cough    Social History  Substance Use Topics  . Smoking status: Never Smoker   . Smokeless tobacco: Never Used  . Alcohol Use: No    Family History  Problem Relation Age of Onset  . Cancer Mother     Unknown type  . Stroke Father   .  Unexplained death Sister   . Other Brother     Unknown health  . Healthy Daughter   . Healthy Son   . Asthma Sister   . Other Sister     Unknown health  . Other Brother     Unknown health  . Other Brother     Unknown health  . Cancer Brother     Unknown type  . Cancer Brother     Unknown type  . Other Brother     Accident  . Other Brother     Lobbyist  . Healthy Daughter   . Healthy Daughter      Review of Systems  Musculoskeletal: Positive for joint pain.       Right knee pain  All other systems reviewed and are negative.   Objective:  Physical Exam  Constitutional: She is oriented to person, place, and time. She appears well-developed and well-nourished.  HENT:  Head: Normocephalic and atraumatic.  Eyes: Pupils are equal, round, and reactive to light.  Neck: Normal range of motion.  Cardiovascular: Normal rate and regular rhythm.   Respiratory: Effort normal.  GI: Soft.  Musculoskeletal:  Left knee has 2+ effusion. Her range of motion is about 0-90. Calf is soft and nontender. She has good hip motion and straight leg raise is negative. She has normal unlabored respirations. There is no palpable lymphadenopathy behind her knee. Right knee has slightly greater motion with no effusion today.   Neurological: She is alert and oriented to person, place, and time.  Skin: Skin is warm and dry.  Psychiatric: She has a normal mood and affect. Her behavior is normal. Judgment and thought content normal.    Vital signs in last 24 hours:    Labs:   Estimated body mass index is 30.50 kg/(m^2) as calculated from the following:   Height as of 02/16/15:  (1.626 m).   Weight as of 02/16/15: 80.65 kg (177 lb 12.8 oz).   Imaging Review Plain radiographs demonstrate severe degenerative joint disease of the right knee(s). The overall alignment isneutral. The bone quality appears to be good for age and reported activity level.  Assessment/Plan:  End stage primary arthritis, right knee   The patient history, physical examination, clinical judgment of the provider and imaging studies are consistent with end stage degenerative joint disease of the right knee(s) and total knee arthroplasty is deemed medically necessary. The treatment options including medical management, injection therapy arthroscopy and arthroplasty were  discussed at length. The risks and benefits of total knee arthroplasty were presented and reviewed. The risks due to aseptic loosening, infection, stiffness, patella tracking problems, thromboembolic complications and other imponderables were discussed. The patient acknowledged the explanation, agreed to proceed with the plan and consent was signed. Patient is being admitted for inpatient treatment for surgery, pain control, PT, OT, prophylactic antibiotics, VTE prophylaxis, progressive ambulation and ADL's and discharge planning. The patient is planning to be discharged to skilled nursing facility

## 2015-04-06 ENCOUNTER — Inpatient Hospital Stay (HOSPITAL_COMMUNITY): Payer: Medicare Other

## 2015-04-06 ENCOUNTER — Inpatient Hospital Stay (HOSPITAL_COMMUNITY)
Admission: RE | Admit: 2015-04-06 | Discharge: 2015-04-08 | DRG: 470 | Disposition: A | Discharge status: Skilled Nursing Facility | Payer: Medicare Other | Source: Ambulatory Visit | Attending: Orthopaedic Surgery | Admitting: Orthopaedic Surgery

## 2015-04-06 ENCOUNTER — Encounter (HOSPITAL_COMMUNITY)
Admission: RE | Disposition: A | Discharge status: Skilled Nursing Facility | Payer: Self-pay | Source: Ambulatory Visit | Attending: Orthopaedic Surgery

## 2015-04-06 ENCOUNTER — Encounter (HOSPITAL_COMMUNITY): Payer: Self-pay | Admitting: *Deleted

## 2015-04-06 ENCOUNTER — Inpatient Hospital Stay (HOSPITAL_COMMUNITY): Payer: Medicare Other | Admitting: Vascular Surgery

## 2015-04-06 ENCOUNTER — Inpatient Hospital Stay (HOSPITAL_COMMUNITY): Payer: Medicare Other | Admitting: Anesthesiology

## 2015-04-06 DIAGNOSIS — M810 Age-related osteoporosis without current pathological fracture: Secondary | ICD-10-CM | POA: Diagnosis present

## 2015-04-06 DIAGNOSIS — M6281 Muscle weakness (generalized): Secondary | ICD-10-CM | POA: Diagnosis not present

## 2015-04-06 DIAGNOSIS — Z90710 Acquired absence of both cervix and uterus: Secondary | ICD-10-CM | POA: Diagnosis not present

## 2015-04-06 DIAGNOSIS — M25569 Pain in unspecified knee: Secondary | ICD-10-CM | POA: Diagnosis not present

## 2015-04-06 DIAGNOSIS — I051 Rheumatic mitral insufficiency: Secondary | ICD-10-CM | POA: Diagnosis present

## 2015-04-06 DIAGNOSIS — Z888 Allergy status to other drugs, medicaments and biological substances status: Secondary | ICD-10-CM | POA: Diagnosis not present

## 2015-04-06 DIAGNOSIS — I099 Rheumatic heart disease, unspecified: Secondary | ICD-10-CM | POA: Diagnosis not present

## 2015-04-06 DIAGNOSIS — I517 Cardiomegaly: Secondary | ICD-10-CM | POA: Diagnosis not present

## 2015-04-06 DIAGNOSIS — I10 Essential (primary) hypertension: Secondary | ICD-10-CM | POA: Diagnosis not present

## 2015-04-06 DIAGNOSIS — M1711 Unilateral primary osteoarthritis, right knee: Secondary | ICD-10-CM | POA: Diagnosis not present

## 2015-04-06 DIAGNOSIS — Z01818 Encounter for other preprocedural examination: Secondary | ICD-10-CM | POA: Diagnosis not present

## 2015-04-06 DIAGNOSIS — R2681 Unsteadiness on feet: Secondary | ICD-10-CM | POA: Diagnosis not present

## 2015-04-06 DIAGNOSIS — R262 Difficulty in walking, not elsewhere classified: Secondary | ICD-10-CM | POA: Diagnosis not present

## 2015-04-06 DIAGNOSIS — Z96651 Presence of right artificial knee joint: Secondary | ICD-10-CM | POA: Diagnosis not present

## 2015-04-06 DIAGNOSIS — I4892 Unspecified atrial flutter: Secondary | ICD-10-CM | POA: Diagnosis not present

## 2015-04-06 DIAGNOSIS — M25561 Pain in right knee: Secondary | ICD-10-CM | POA: Diagnosis not present

## 2015-04-06 DIAGNOSIS — Z4789 Encounter for other orthopedic aftercare: Secondary | ICD-10-CM | POA: Diagnosis not present

## 2015-04-06 DIAGNOSIS — M179 Osteoarthritis of knee, unspecified: Secondary | ICD-10-CM | POA: Diagnosis not present

## 2015-04-06 HISTORY — PX: TOTAL KNEE ARTHROPLASTY: SHX125

## 2015-04-06 LAB — URINALYSIS, ROUTINE W REFLEX MICROSCOPIC
Bilirubin Urine: NEGATIVE
GLUCOSE, UA: NEGATIVE mg/dL
HGB URINE DIPSTICK: NEGATIVE
Ketones, ur: NEGATIVE mg/dL
Leukocytes, UA: NEGATIVE
Nitrite: NEGATIVE
Protein, ur: NEGATIVE mg/dL
SPECIFIC GRAVITY, URINE: 1.012 (ref 1.005–1.030)
pH: 7.5 (ref 5.0–8.0)

## 2015-04-06 LAB — CBC WITH DIFFERENTIAL/PLATELET
BASOS ABS: 0 10*3/uL (ref 0.0–0.1)
BASOS PCT: 0 %
EOS ABS: 0.1 10*3/uL (ref 0.0–0.7)
Eosinophils Relative: 1 %
HCT: 38.2 % (ref 36.0–46.0)
HEMOGLOBIN: 12.3 g/dL (ref 12.0–15.0)
Lymphocytes Relative: 20 %
Lymphs Abs: 1.4 10*3/uL (ref 0.7–4.0)
MCH: 27.6 pg (ref 26.0–34.0)
MCHC: 32.2 g/dL (ref 30.0–36.0)
MCV: 85.7 fL (ref 78.0–100.0)
MONOS PCT: 9 %
Monocytes Absolute: 0.6 10*3/uL (ref 0.1–1.0)
NEUTROS PCT: 70 %
Neutro Abs: 5 10*3/uL (ref 1.7–7.7)
Platelets: 205 10*3/uL (ref 150–400)
RBC: 4.46 MIL/uL (ref 3.87–5.11)
RDW: 14.4 % (ref 11.5–15.5)
WBC: 7.1 10*3/uL (ref 4.0–10.5)

## 2015-04-06 LAB — URINE MICROSCOPIC-ADD ON: RBC / HPF: NONE SEEN RBC/hpf (ref 0–5)

## 2015-04-06 LAB — TYPE AND SCREEN
ABO/RH(D): O POS
Antibody Screen: NEGATIVE

## 2015-04-06 LAB — APTT: APTT: 33 s (ref 24–37)

## 2015-04-06 LAB — PROTIME-INR
INR: 1.15 (ref 0.00–1.49)
PROTHROMBIN TIME: 14.9 s (ref 11.6–15.2)

## 2015-04-06 LAB — CBC AND DIFFERENTIAL: WBC: 7.1 10*3/mL

## 2015-04-06 LAB — ABO/RH: ABO/RH(D): O POS

## 2015-04-06 SURGERY — ARTHROPLASTY, KNEE, TOTAL
Anesthesia: Spinal | Laterality: Right

## 2015-04-06 MED ORDER — ACETAMINOPHEN 650 MG RE SUPP: 650.0000 mg | Freq: Four times a day (QID) | RECTAL | Status: DC | PRN

## 2015-04-06 MED ORDER — DEXAMETHASONE SODIUM PHOSPHATE 4 MG/ML IJ SOLN
INTRAMUSCULAR | Status: AC
  Filled 2015-04-06: qty 1

## 2015-04-06 MED ORDER — ONDANSETRON HCL 4 MG/2ML IJ SOLN: 4.0000 mg | Freq: Once | INTRAMUSCULAR | Status: DC | PRN

## 2015-04-06 MED ORDER — 0.9 % SODIUM CHLORIDE (POUR BTL) OPTIME
TOPICAL | Status: DC | PRN
  Administered 2015-04-06: 1000 mL

## 2015-04-06 MED ORDER — POTASSIUM CHLORIDE CRYS ER 10 MEQ PO TBCR
10.0000 meq | EXTENDED_RELEASE_TABLET | Freq: Every day | ORAL | Status: DC
  Administered 2015-04-06 – 2015-04-08 (×3): 10 meq via ORAL
  Filled 2015-04-06 (×6): qty 1

## 2015-04-06 MED ORDER — PANTOPRAZOLE SODIUM 40 MG PO TBEC
40.0000 mg | DELAYED_RELEASE_TABLET | Freq: Every day | ORAL | Status: DC
  Administered 2015-04-06 – 2015-04-08 (×3): 40 mg via ORAL
  Filled 2015-04-06 (×3): qty 1

## 2015-04-06 MED ORDER — LACTATED RINGERS IV SOLN
INTRAVENOUS | Status: DC
Start: 2015-04-06 — End: 2015-04-06

## 2015-04-06 MED ORDER — ONDANSETRON HCL 4 MG/2ML IJ SOLN
4.0000 mg | Freq: Four times a day (QID) | INTRAMUSCULAR | Status: DC | PRN
  Administered 2015-04-07: 4 mg via INTRAVENOUS
  Filled 2015-04-06: qty 2

## 2015-04-06 MED ORDER — BUPIVACAINE-EPINEPHRINE (PF) 0.5% -1:200000 IJ SOLN
INTRAMUSCULAR | Status: DC | PRN
  Administered 2015-04-06: 20 mL via PERINEURAL

## 2015-04-06 MED ORDER — PHENOL 1.4 % MT LIQD: 1.0000 | OROMUCOSAL | Status: DC | PRN

## 2015-04-06 MED ORDER — SODIUM CHLORIDE 0.9 % IJ SOLN
INTRAMUSCULAR | Status: DC | PRN
  Administered 2015-04-06: 40 mL via INTRAVENOUS

## 2015-04-06 MED ORDER — FUROSEMIDE 20 MG PO TABS: 20.0000 mg | ORAL_TABLET | Freq: Every day | ORAL | Status: DC | PRN

## 2015-04-06 MED ORDER — METHOCARBAMOL 500 MG PO TABS
ORAL_TABLET | ORAL | Status: AC
  Filled 2015-04-06: qty 1

## 2015-04-06 MED ORDER — MEPERIDINE HCL 25 MG/ML IJ SOLN: 6.2500 mg | INTRAMUSCULAR | Status: DC | PRN

## 2015-04-06 MED ORDER — METOCLOPRAMIDE HCL 10 MG PO TABS: 5.0000 mg | ORAL_TABLET | Freq: Three times a day (TID) | ORAL | Status: DC | PRN

## 2015-04-06 MED ORDER — METOCLOPRAMIDE HCL 5 MG/ML IJ SOLN: 5.0000 mg | Freq: Three times a day (TID) | INTRAMUSCULAR | Status: DC | PRN

## 2015-04-06 MED ORDER — BISACODYL 5 MG PO TBEC: 5.0000 mg | DELAYED_RELEASE_TABLET | Freq: Every day | ORAL | Status: DC | PRN

## 2015-04-06 MED ORDER — CHLORHEXIDINE GLUCONATE 4 % EX LIQD: 60.0000 mL | Freq: Once | CUTANEOUS | Status: DC

## 2015-04-06 MED ORDER — MIDAZOLAM HCL 2 MG/2ML IJ SOLN
INTRAMUSCULAR | Status: AC
  Filled 2015-04-06: qty 2

## 2015-04-06 MED ORDER — METHOCARBAMOL 500 MG PO TABS
500.0000 mg | ORAL_TABLET | Freq: Four times a day (QID) | ORAL | Status: DC | PRN
  Administered 2015-04-06 – 2015-04-08 (×2): 500 mg via ORAL
  Filled 2015-04-06: qty 1

## 2015-04-06 MED ORDER — BUPIVACAINE-EPINEPHRINE (PF) 0.5% -1:200000 IJ SOLN
INTRAMUSCULAR | Status: AC
  Filled 2015-04-06: qty 30

## 2015-04-06 MED ORDER — HYDROMORPHONE HCL 1 MG/ML IJ SOLN
0.2500 mg | INTRAMUSCULAR | Status: DC | PRN
  Administered 2015-04-06: 1 mg via INTRAVENOUS

## 2015-04-06 MED ORDER — DIPHENHYDRAMINE HCL 12.5 MG/5ML PO ELIX
12.5000 mg | ORAL_SOLUTION | ORAL | Status: DC | PRN
  Filled 2015-04-06: qty 10

## 2015-04-06 MED ORDER — FENTANYL CITRATE (PF) 100 MCG/2ML IJ SOLN
INTRAMUSCULAR | Status: DC | PRN
  Administered 2015-04-06 (×2): 50 ug via INTRAVENOUS
  Administered 2015-04-06: 100 ug via INTRAVENOUS

## 2015-04-06 MED ORDER — HYDROMORPHONE HCL 1 MG/ML IJ SOLN
INTRAMUSCULAR | Status: AC
  Administered 2015-04-07: 1 mg
  Filled 2015-04-06: qty 1

## 2015-04-06 MED ORDER — BISACODYL 5 MG PO TBEC: 10.0000 mg | DELAYED_RELEASE_TABLET | Freq: Every day | ORAL | Status: DC | PRN

## 2015-04-06 MED ORDER — SODIUM CHLORIDE 0.9 % IR SOLN
Status: DC | PRN
  Administered 2015-04-06: 1000 mL

## 2015-04-06 MED ORDER — HYDROMORPHONE HCL 1 MG/ML IJ SOLN
0.5000 mg | INTRAMUSCULAR | Status: DC | PRN
  Administered 2015-04-06 – 2015-04-08 (×7): 1 mg via INTRAVENOUS
  Filled 2015-04-06 (×8): qty 1

## 2015-04-06 MED ORDER — BUPIVACAINE LIPOSOME 1.3 % IJ SUSP
20.0000 mL | INTRAMUSCULAR | Status: AC
  Administered 2015-04-06: 20 mL
  Filled 2015-04-06: qty 20

## 2015-04-06 MED ORDER — AMLODIPINE BESYLATE 10 MG PO TABS
10.0000 mg | ORAL_TABLET | Freq: Every day | ORAL | Status: DC
  Administered 2015-04-06 – 2015-04-08 (×3): 10 mg via ORAL
  Filled 2015-04-06 (×3): qty 1

## 2015-04-06 MED ORDER — HYDROCODONE-ACETAMINOPHEN 5-325 MG PO TABS
ORAL_TABLET | ORAL | Status: AC
  Administered 2015-04-06: 2
  Filled 2015-04-06: qty 2

## 2015-04-06 MED ORDER — ACETAMINOPHEN 325 MG PO TABS: 650.0000 mg | ORAL_TABLET | Freq: Four times a day (QID) | ORAL | Status: DC | PRN

## 2015-04-06 MED ORDER — APIXABAN 5 MG PO TABS
5.0000 mg | ORAL_TABLET | Freq: Two times a day (BID) | ORAL | Status: DC
  Administered 2015-04-07 – 2015-04-08 (×3): 5 mg via ORAL
  Filled 2015-04-06 (×3): qty 1

## 2015-04-06 MED ORDER — ALUM & MAG HYDROXIDE-SIMETH 200-200-20 MG/5ML PO SUSP: 30.0000 mL | ORAL | Status: DC | PRN

## 2015-04-06 MED ORDER — CEFAZOLIN SODIUM-DEXTROSE 2-4 GM/100ML-% IV SOLN
2.0000 g | Freq: Four times a day (QID) | INTRAVENOUS | Status: AC
Start: 2015-04-06 — End: 2015-04-07
  Administered 2015-04-07: 2 g via INTRAVENOUS
  Filled 2015-04-06 (×4): qty 100

## 2015-04-06 MED ORDER — TRANEXAMIC ACID 1000 MG/10ML IV SOLN
1000.0000 mg | INTRAVENOUS | Status: AC
  Administered 2015-04-06: 1000 mg via INTRAVENOUS
  Filled 2015-04-06: qty 10

## 2015-04-06 MED ORDER — ONDANSETRON HCL 4 MG PO TABS
4.0000 mg | ORAL_TABLET | Freq: Four times a day (QID) | ORAL | Status: DC | PRN
  Administered 2015-04-06: 4 mg via ORAL
  Filled 2015-04-06: qty 1

## 2015-04-06 MED ORDER — PROPOFOL 10 MG/ML IV BOLUS
INTRAVENOUS | Status: AC
  Filled 2015-04-06: qty 20

## 2015-04-06 MED ORDER — FENTANYL CITRATE (PF) 250 MCG/5ML IJ SOLN
INTRAMUSCULAR | Status: AC
  Filled 2015-04-06: qty 5

## 2015-04-06 MED ORDER — PROPOFOL 500 MG/50ML IV EMUL
INTRAVENOUS | Status: DC | PRN
  Administered 2015-04-06: 50 ug/kg/min via INTRAVENOUS

## 2015-04-06 MED ORDER — HYDROCODONE-ACETAMINOPHEN 5-325 MG PO TABS
1.0000 | ORAL_TABLET | ORAL | Status: DC | PRN
  Administered 2015-04-06 – 2015-04-08 (×9): 2 via ORAL
  Filled 2015-04-06 (×10): qty 2

## 2015-04-06 MED ORDER — MIDAZOLAM HCL 5 MG/5ML IJ SOLN
INTRAMUSCULAR | Status: DC | PRN
  Administered 2015-04-06 (×2): 1 mg via INTRAVENOUS

## 2015-04-06 MED ORDER — DOCUSATE SODIUM 100 MG PO CAPS
100.0000 mg | ORAL_CAPSULE | Freq: Two times a day (BID) | ORAL | Status: DC
  Administered 2015-04-06 – 2015-04-08 (×4): 100 mg via ORAL
  Filled 2015-04-06 (×5): qty 1

## 2015-04-06 MED ORDER — HYDROMORPHONE HCL 1 MG/ML IJ SOLN
INTRAMUSCULAR | Status: AC
  Filled 2015-04-06: qty 1

## 2015-04-06 MED ORDER — PHENYLEPHRINE HCL 10 MG/ML IJ SOLN
10.0000 mg | INTRAMUSCULAR | Status: DC | PRN
  Administered 2015-04-06: 50 ug/min via INTRAVENOUS

## 2015-04-06 MED ORDER — MENTHOL 3 MG MT LOZG
1.0000 | LOZENGE | OROMUCOSAL | Status: DC | PRN
Start: 2015-04-06 — End: 2015-04-08

## 2015-04-06 MED ORDER — DEXAMETHASONE SODIUM PHOSPHATE 4 MG/ML IJ SOLN
INTRAMUSCULAR | Status: DC | PRN
  Administered 2015-04-06: 4 mg via INTRAVENOUS

## 2015-04-06 MED ORDER — LACTATED RINGERS IV SOLN
INTRAVENOUS | Status: DC
  Administered 2015-04-06: 19:00:00 via INTRAVENOUS

## 2015-04-06 MED ORDER — PROPOFOL 10 MG/ML IV BOLUS
INTRAVENOUS | Status: DC | PRN
  Administered 2015-04-06: 20 mg via INTRAVENOUS
  Administered 2015-04-06: 110 mg via INTRAVENOUS
  Administered 2015-04-06: 10 mg via INTRAVENOUS

## 2015-04-06 MED ORDER — LIDOCAINE HCL (CARDIAC) 20 MG/ML IV SOLN
INTRAVENOUS | Status: AC
  Filled 2015-04-06: qty 5

## 2015-04-06 MED ORDER — ONDANSETRON HCL 4 MG/2ML IJ SOLN
INTRAMUSCULAR | Status: DC | PRN
  Administered 2015-04-06: 4 mg via INTRAVENOUS

## 2015-04-06 MED ORDER — METHOCARBAMOL 1000 MG/10ML IJ SOLN
500.0000 mg | Freq: Four times a day (QID) | INTRAVENOUS | Status: DC | PRN
  Filled 2015-04-06: qty 5

## 2015-04-06 SURGICAL SUPPLY — 65 items
BAG DECANTER FOR FLEXI CONT (MISCELLANEOUS) IMPLANT
BANDAGE ELASTIC 4 VELCRO ST LF (GAUZE/BANDAGES/DRESSINGS) IMPLANT
BANDAGE ESMARK 6X9 LF (GAUZE/BANDAGES/DRESSINGS) ×1 IMPLANT
BLADE SAGITTAL 25.0X1.19X90 (BLADE) ×2 IMPLANT
BLADE SAW SGTL 13.0X1.19X90.0M (BLADE) IMPLANT
BLADE SURG ROTATE 9660 (MISCELLANEOUS) IMPLANT
BNDG ELASTIC 6X10 VLCR STRL LF (GAUZE/BANDAGES/DRESSINGS) ×2 IMPLANT
BNDG ESMARK 6X9 LF (GAUZE/BANDAGES/DRESSINGS) ×2
BNDG GAUZE ELAST 4 BULKY (GAUZE/BANDAGES/DRESSINGS) ×4 IMPLANT
BOWL SMART MIX CTS (DISPOSABLE) ×2 IMPLANT
CAP KNEE TOTAL 3 SIGMA ×2 IMPLANT
CEMENT HV SMART SET (Cement) ×4 IMPLANT
COVER SURGICAL LIGHT HANDLE (MISCELLANEOUS) ×2 IMPLANT
CUFF TOURNIQUET SINGLE 34IN LL (TOURNIQUET CUFF) ×2 IMPLANT
CUFF TOURNIQUET SINGLE 44IN (TOURNIQUET CUFF) IMPLANT
DECANTER SPIKE VIAL GLASS SM (MISCELLANEOUS) IMPLANT
DRAPE EXTREMITY T 121X128X90 (DRAPE) ×2 IMPLANT
DRAPE PROXIMA HALF (DRAPES) ×2 IMPLANT
DRAPE U-SHAPE 47X51 STRL (DRAPES) ×2 IMPLANT
DRSG ADAPTIC 3X8 NADH LF (GAUZE/BANDAGES/DRESSINGS) ×2 IMPLANT
DRSG PAD ABDOMINAL 8X10 ST (GAUZE/BANDAGES/DRESSINGS) ×4 IMPLANT
DURAPREP 26ML APPLICATOR (WOUND CARE) ×2 IMPLANT
ELECT REM PT RETURN 9FT ADLT (ELECTROSURGICAL) ×2
ELECTRODE REM PT RTRN 9FT ADLT (ELECTROSURGICAL) ×1 IMPLANT
GAUZE SPONGE 4X4 12PLY STRL (GAUZE/BANDAGES/DRESSINGS) ×2 IMPLANT
GLOVE BIO SURGEON STRL SZ8 (GLOVE) ×4 IMPLANT
GLOVE BIOGEL PI IND STRL 6.5 (GLOVE) ×1 IMPLANT
GLOVE BIOGEL PI IND STRL 7.0 (GLOVE) ×1 IMPLANT
GLOVE BIOGEL PI IND STRL 8 (GLOVE) ×2 IMPLANT
GLOVE BIOGEL PI INDICATOR 6.5 (GLOVE) ×1
GLOVE BIOGEL PI INDICATOR 7.0 (GLOVE) ×1
GLOVE BIOGEL PI INDICATOR 8 (GLOVE) ×2
GLOVE SURG SS PI 6.5 STRL IVOR (GLOVE) ×2 IMPLANT
GLOVE SURG SS PI 7.0 STRL IVOR (GLOVE) ×2 IMPLANT
GOWN STRL REUS W/ TWL LRG LVL3 (GOWN DISPOSABLE) ×1 IMPLANT
GOWN STRL REUS W/ TWL XL LVL3 (GOWN DISPOSABLE) ×3 IMPLANT
GOWN STRL REUS W/TWL LRG LVL3 (GOWN DISPOSABLE) ×1
GOWN STRL REUS W/TWL XL LVL3 (GOWN DISPOSABLE) ×3
HANDPIECE INTERPULSE COAX TIP (DISPOSABLE) ×1
HOOD PEEL AWAY FACE SHEILD DIS (HOOD) ×4 IMPLANT
IMMOBILIZER KNEE 22 UNIV (SOFTGOODS) ×2 IMPLANT
KIT BASIN OR (CUSTOM PROCEDURE TRAY) ×2 IMPLANT
KIT ROOM TURNOVER OR (KITS) ×2 IMPLANT
MANIFOLD NEPTUNE II (INSTRUMENTS) ×2 IMPLANT
NEEDLE HYPO 21X1 ECLIPSE (NEEDLE) ×2 IMPLANT
NS IRRIG 1000ML POUR BTL (IV SOLUTION) ×2 IMPLANT
PACK TOTAL JOINT (CUSTOM PROCEDURE TRAY) ×2 IMPLANT
PACK UNIVERSAL I (CUSTOM PROCEDURE TRAY) ×2 IMPLANT
PAD ARMBOARD 7.5X6 YLW CONV (MISCELLANEOUS) ×4 IMPLANT
SET HNDPC FAN SPRY TIP SCT (DISPOSABLE) ×1 IMPLANT
STAPLER VISISTAT 35W (STAPLE) IMPLANT
STRIP CLOSURE SKIN 1/2X4 (GAUZE/BANDAGES/DRESSINGS) ×2 IMPLANT
SUCTION FRAZIER HANDLE 10FR (MISCELLANEOUS)
SUCTION TUBE FRAZIER 10FR DISP (MISCELLANEOUS) IMPLANT
SUT MNCRL AB 3-0 PS2 18 (SUTURE) IMPLANT
SUT VIC AB 0 CT1 27 (SUTURE) ×2
SUT VIC AB 0 CT1 27XBRD ANBCTR (SUTURE) ×2 IMPLANT
SUT VIC AB 2-0 CT1 27 (SUTURE) ×2
SUT VIC AB 2-0 CT1 TAPERPNT 27 (SUTURE) ×2 IMPLANT
SUT VLOC 180 0 24IN GS25 (SUTURE) ×2 IMPLANT
SYR 50ML LL SCALE MARK (SYRINGE) ×2 IMPLANT
TOWEL OR 17X24 6PK STRL BLUE (TOWEL DISPOSABLE) ×2 IMPLANT
TOWEL OR 17X26 10 PK STRL BLUE (TOWEL DISPOSABLE) ×2 IMPLANT
TRAY FOLEY CATH 14FR (SET/KITS/TRAYS/PACK) IMPLANT
WATER STERILE IRR 1000ML POUR (IV SOLUTION) IMPLANT

## 2015-04-06 NOTE — Transfer of Care (Signed)
Immediate Anesthesia Transfer of Care Note  Patient: Erma HeritageLizzie M Vader  Procedure(s) Performed: Procedure(s): RIGHT TOTAL KNEE ARTHROPLASTY (Right)  Patient Location: PACU  Anesthesia Type:General  Level of Consciousness: awake, alert , oriented and patient cooperative  Airway & Oxygen Therapy: Patient Spontanous Breathing and Patient connected to nasal cannula oxygen  Post-op Assessment: Report given to RN and Post -op Vital signs reviewed and stable  Post vital signs: Reviewed and stable  Last Vitals:  Filed Vitals:   04/06/15 1107  BP: 150/84  Pulse: 86  Temp: 36.8 C  Resp: 20    Complications: No apparent anesthesia complications

## 2015-04-06 NOTE — Progress Notes (Signed)
[name] [id number] Admission Data: 04/06/2015 6:37 PM Attending Provider: Marcene CorningPeter Dalldorf, MD  WUJ:WJXBJPCP:KLIMA, Smith MinceLAWRENCE D, MD Consults/ Treatment Team:    Abigail HeritageLizzie M [name] is a 75 y.o. female patient admitted from OR awake, alert  & orientated  X 3,  Prior, VSS - Blood pressure 166/95, pulse 67, temperature 98.4 F (36.9 C), temperature source Oral, resp. rate 18, height 5\' 4"  (1.626 m), weight 76.204 kg (168 lb), SpO2 98 %., O2  2 L nasal cannular, no c/o shortness of breath, no c/o chest pain, no distress noted.   IV site WDL:  Left hand running lactated ringers at 6050ml/hour.  Allergies:   Allergies  Allergen Reactions  . Ace Inhibitors Cough     Past Medical History  Diagnosis Date  . Essential hypertension   . Osteoarthritis     knees and hands  . Systolic murmur     per patient report she has had rheumatic fever in childhood  . Osteoporosis, post-menopausal 2010    left femur T -2.7, left forearm T -3.5  . Blood transfusion without reported diagnosis     1960's, unknown reason  . Post-menopausal atrophic vaginitis   . Obesity, Class I, BMI 30-34.9   . Anemia 03/16/2014  . Cricopharyngeal achalasia 04/08/2014    Rigid esophagoscopy, dilation, and Botox injection into cricopharyngeus muscle recommended   . Chronic rheumatic heart disease 05/05/2014  . Mitral valve regurgitation, rheumatic 05/05/2014    Moderate to severe with peak PA pressure of 56 mmHg on Echo 04/2014   . Atrial flutter (HCC) 05/07/2014    With controlled ventricular rate on no AV nodal agents   . PONV (postoperative nausea and vomiting)     with recent dilatation of throat, had N/V     Pt orientation to unit, room and routine. Information packet given to patient/family and safety video watched.  Admission INP armband ID verified with patient/family, and in place. SR up x 2, fall risk assessment complete with Patient and family verbalizing understanding of risks associated with falls. Pt verbalizes an understanding  of how to use the call bell and to call for help before getting out of bed.  Skin, clean-dry- intact. No evidence of skin break down noted on exam. Can not assess backside at this time, pt. Just returned from surgery and has ortho device in place.      Will cont to monitor and assist as needed.  Kern ReapBrumagin, Keyonna Comunale L, RN 04/06/2015 6:37 PM

## 2015-04-06 NOTE — Anesthesia Procedure Notes (Addendum)
Spinal Patient location during procedure: OR Start time: 04/06/2015 1:35 PM End time: 04/06/2015 1:40 PM Staffing Anesthesiologist: Arta BruceSSEY, KEVIN Performed by: anesthesiologist  Preanesthetic Checklist Completed: patient identified, site marked, surgical consent, pre-op evaluation, timeout performed, IV checked, risks and benefits discussed and monitors and equipment checked Spinal Block Patient position: sitting Prep: Betadine Patient monitoring: heart rate, cardiac monitor, continuous pulse ox and blood pressure Approach: right paramedian Location: L3-4 Injection technique: single-shot Needle Needle type: Spinocan  Needle gauge: 24 G Needle length: 9 cm Needle insertion depth: 7 cm  Procedure Name: LMA Insertion Date/Time: 04/06/2015 2:02 PM Performed by: Sharlene DoryWALKER, Jadence Kinlaw E Pre-anesthesia Checklist: Patient identified, Emergency Drugs available, Suction available, Patient being monitored and Timeout performed Patient Re-evaluated:Patient Re-evaluated prior to inductionOxygen Delivery Method: Circle system utilized Preoxygenation: Pre-oxygenation with 100% oxygen Intubation Type: IV induction LMA: LMA inserted LMA Size: 4.0 Number of attempts: 1 Placement Confirmation: positive ETCO2 and breath sounds checked- equal and bilateral Tube secured with: Tape

## 2015-04-06 NOTE — Progress Notes (Signed)
Orthopedic Tech Progress Note Patient Details:  Abigail HeritageLizzie M [name] [birth year] [id number]  CPM Right Knee CPM Right Knee: On Right Knee Flexion (Degrees): 90 Right Knee Extension (Degrees): 0 Additional Comments: Trapeze bar   Saul FordyceJennifer C Antwanette Wesche 04/06/2015, 5:24 PM

## 2015-04-06 NOTE — Anesthesia Preprocedure Evaluation (Addendum)
Anesthesia Evaluation  Patient identified by MRN, date of birth, ID band Patient awake    Reviewed: Allergy & Precautions, NPO status , Patient's Chart, lab work & pertinent test results  History of Anesthesia Complications (+) PONV  Airway Mallampati: I  TM Distance: >3 FB Neck ROM: Full    Dental   Pulmonary    Pulmonary exam normal        Cardiovascular hypertension, Pt. on medications Normal cardiovascular exam  Echo 04/2014 Study Conclusions  - Left ventricle: The cavity size was normal. Wall thickness was increased in a pattern of mild LVH. There was mild focal basal hypertrophy of the septum. Systolic function was normal. The estimated ejection fraction was in the range of 55% to 60%. Wall motion was normal; there were no regional wall motion abnormalities. - Aortic valve: There was mild stenosis. - Mitral valve: Calcified annulus. Mildly thickened leaflets . There was moderate to severe regurgitation. - Left atrium: The atrium was severely dilated. - Atrial septum: There was a patent foramen ovale. - Pulmonary arteries: Systolic pressure was moderately to severely increased. PA peak pressure: 56 mm Hg (S).  Impressions:  - Normal LV function; severe LAE; mild AS; moderate to severe MR; mild TR; moderate to severe elevation in pulmonary pressure.    Neuro/Psych    GI/Hepatic   Endo/Other    Renal/GU      Musculoskeletal   Abdominal   Peds  Hematology   Anesthesia Other Findings   Reproductive/Obstetrics                           Anesthesia Physical Anesthesia Plan  ASA: II  Anesthesia Plan: Spinal   Post-op Pain Management:    Induction: Intravenous  Airway Management Planned: Natural Airway  Additional Equipment:   Intra-op Plan:   Post-operative Plan:   Informed Consent: I have reviewed the patients History and Physical, chart, labs and  discussed the procedure including the risks, benefits and alternatives for the proposed anesthesia with the patient or authorized representative who has indicated his/her understanding and acceptance.     Plan Discussed with: CRNA and Surgeon  Anesthesia Plan Comments:         Anesthesia Quick Evaluation

## 2015-04-06 NOTE — Interval H&P Note (Signed)
OK for surgery PD 

## 2015-04-06 NOTE — Op Note (Signed)
PREOP DIAGNOSIS: DJD RIGHT KNEE POSTOP DIAGNOSIS: same PROCEDURE: RIGHT TKR ANESTHESIA: General ATTENDING SURGEON: Ankith Edmonston G ASSISTANTElodia Florence PA  INDICATIONS FOR PROCEDURE: [name] is a 75 y.o. female who has struggled for a long time with pain due to degenerative arthritis of the right knee.  The patient has failed many conservative non-operative measures and at this point has pain which limits the ability to sleep and walk.  The patient is offered total knee replacement.  Informed operative consent was obtained after discussion of possible risks of anesthesia, infection, neurovascular injury, DVT, and death.  The importance of the post-operative rehabilitation protocol to optimize result was stressed extensively with the patient.  SUMMARY OF FINDINGS AND PROCEDURE:  [name] was taken to the operative suite where under the above anesthesia a right knee replacement was performed.  There were advanced degenerative changes and the bone quality was good.  We used the DePuy LCS system and placed size standard plus femur, 4 tibia, 38 mm all polyethylene patella, and a size 10 mm spacer.  Elodia Florence PA-C assisted throughout and was invaluable to the completion of the case in that he helped retract and maintain exposure while I placed components.  He also helped close thereby minimizing OR time.  The patient was admitted for appropriate post-op care to include perioperative antibiotics and mechanical and pharmacologic measures for DVT prophylaxis.  DESCRIPTION OF PROCEDURE:  [name] was taken to the operative suite where the above anesthesia was applied.  The patient was positioned supine and prepped and draped in normal sterile fashion.  An appropriate time out was performed.  After the administration of kefzol pre-op antibiotic the leg was elevated and exsanguinated and a tourniquet inflated. A standard longitudinal incision was made on the anterior knee.  Dissection was  carried down to the extensor mechanism.  All appropriate anti-infective measures were used including the pre-operative antibiotic, betadine impregnated drape, and closed hooded exhaust systems for each member of the surgical team.  A medial parapatellar incision was made in the extensor mechanism and the knee cap flipped and the knee flexed.  Some residual meniscal tissues were removed along with any remaining ACL/PCL tissue.  A guide was placed on the tibia and a flat cut was made on it's superior surface.  An intramedullary guide was placed in the femur and was utilized to make anterior and posterior cuts creating an appropriate flexion gap.  A second intramedullary guide was placed in the femur to make a distal cut properly balancing the knee with an extension gap equal to the flexion gap.  The three bones sized to the above mentioned sizes and the appropriate guides were placed and utilized.  A trial reduction was done and the knee easily came to full extension and the patella tracked well on flexion.  The trial components were removed and all bones were cleaned with pulsatile lavage and then dried thoroughly.  Cement was mixed and was pressurized onto the bones followed by placement of the aforementioned components.  Excess cement was trimmed and pressure was held on the components until the cement had hardened.  The tourniquet was deflated and a small amount of bleeding was controlled with cautery and pressure.  The knee was irrigated thoroughly.  The extensor mechanism was re-approximated with V-loc suture in running fashion.  The knee was flexed and the repair was solid.  The subcutaneous tissues were re-approximated with #0 and #2-0 vicryl and the skin closed with a subcuticular  stitch and steristrips.  A sterile dressing was applied.  Intraoperative fluids, EBL, and tourniquet time can be obtained from anesthesia records.  DISPOSITION:  The patient was taken to recovery room in stable condition and  admitted for appropriate post-op care to include peri-operative antibiotic and DVT prophylaxis with mechanical and pharmacologic measures.  Lorne Winkels G 04/06/2015, 3:20 PM

## 2015-04-07 ENCOUNTER — Encounter (HOSPITAL_COMMUNITY): Payer: Self-pay | Admitting: General Practice

## 2015-04-07 NOTE — Clinical Social Work Note (Signed)
Clinical Social Work Assessment  Patient Details  Name: [name] MRN: [id number] Date of Birth: Sep 18, 1940  Date of referral:  04/07/15               Reason for consult:  Facility Placement                Permission sought to share information with:  Facility Sport and exercise psychologist, Family Supports Permission granted to share information::  Yes, Verbal Permission Granted  Name::     Geni Bers  Agency::  [facility] SN[facility]  Relationship::  Daughter  Contact Information:  [phone]  Housing/Transportation Living arrangements for the past 2 months:  [place] I of Information:  Patient Patient Interpreter Needed:  None Criminal Activity/Legal Involvement Pertinent to Current Situation/Hospitalization:  No - Comment as needed Significant Relationships:  Adult Children, Spouse Lives with:  Spouse Do you feel safe going back to the place where you live?  No Need for family participation in patient care:  Yes (Comment)  Care giving concerns:  CSW received referral for possible SNF placement at time of discharge. CSW met with patient bedside regarding PT recommendation of SNF placement at time of discharge. Patient reported that patient's husband is currently unable to care for patient at their home given patient's current physical needs and fall risk. Patient expressed understanding of PT recommendation and is agreeable to SNF placement at time of discharge. CSW to continue to follow and assist with discharge planning needs.   Social Worker assessment / plan:  CSW spoke with patient concerning possibility of rehab at [facility] before returning home.  Employment status:  Retired Nurse, adult PT Recommendations:  [place] / Referral to community resources:  [place]  Patient/Family's Response to care:  Patient and patient's husband recognize need for rehab before returning home and are agreeable to a  SNF in [place]. Patient stated she believed she already had paperwork started at [facility].  Patient/Family's Understanding of and Emotional Response to Diagnosis, Current Treatment, and Prognosis:  Patient is realistic regarding therapy needs. No questions/concerns about plan or treatment.    Emotional Assessment Appearance:  Appears stated age Attitude/Demeanor/Rapport:   (Appropriate) Affect (typically observed):  Accepting, Appropriate, Pleasant Orientation:  Oriented to Self, Oriented to Place, Oriented to  Time, Oriented to Situation Alcohol / Substance use:  Not Applicable Psych involvement (Current and /or in the community):  No (Comment)  Discharge Needs  Concerns to be addressed:  Care Coordination Readmission within the last 30 days:  No Current discharge risk:  None Barriers to Discharge:  Continued Medical Work up   [employer], Latanya Presser 04/07/2015, 6:37 PM

## 2015-04-07 NOTE — Anesthesia Postprocedure Evaluation (Signed)
Anesthesia Post Note  [name]  Procedure(s) Performed: Procedure(s) (LRB): RIGHT TOTAL KNEE ARTHROPLASTY (Right)  Patient location during evaluation: PACU Anesthesia Type: Combined General/Spinal Level of consciousness: awake and alert Pain management: pain level controlled Vital Signs Assessment: post-procedure vital signs reviewed and stable Respiratory status: spontaneous breathing, nonlabored ventilation, respiratory function stable and patient connected to nasal cannula oxygen Cardiovascular status: blood pressure returned to baseline and stable Postop Assessment: no signs of nausea or vomiting, spinal receding, no headache, no backache and patient able to bend at knees Anesthetic complications: no    Last Vitals:  Filed Vitals:   04/06/15 2246 04/07/15 0553  BP: 137/71 150/84  Pulse: 74 76  Temp: 36.8 C 37.2 C  Resp: 18 18    Last Pain:  Filed Vitals:   04/07/15 0616  PainSc: 8                  Cecile HearingStephen Edward Brizeida Mcmurry

## 2015-04-07 NOTE — Care Management Note (Signed)
Case Management Note  Patient Details  Name: [name] MRN: [id number] Date of Birth: Nov 12, 1940  Subjective/Objective:                 Spoke with patient and her son in the room. She states that she is going to [place] after discharge. CM saught clarification if [facility] vs desired. The son mentioned "some paperwork" his sister had. CM notified CSW to [facility].    Action/Plan:  Probable DC to SNF. DC likely tomorrow.  Expected Discharge Date:                  Expected Discharge Plan:  Skilled Nursing Facility  In-House Referral:  Clinical Social Work  Discharge planning Services  CM Consult  Post Acute Care Choice:  NA Choice offered to:  Patient, Adult Children  DME Arranged:    DME Agency:     HH Arranged:    HH Agency:     Status of Service:  Completed, signed off  Medicare Important Message Given:    Date Medicare IM Given:    Medicare IM give by:    Date Additional Medicare IM Given:    Additional Medicare Important Message give by:     If discussed at Long Length of Stay Meetings, dates discussed:    Additional Comments:  Lawerance SabalDebbie Winton [place]ord, RN 04/07/2015, 12:00 PM

## 2015-04-07 NOTE — Progress Notes (Signed)
OT Cancellation Note  Patient Details Name: Abigail HeritageLizzie M [name] MRN: [id number] DOB: [birth year]   Cancelled Treatment:    Reason Eval/Treat Not Completed: Other (comment) Pt is Medicare and current D/C plan is SNF. No apparent immediate acute care OT needs, therefore will defer OT to SNF. If OT eval is needed please call Acute Rehab Dept. at [phone] or text page OT at [phone].  [facility] IncWARD,HILLARY  Jaxxson Cavanah, OTR/L  [phone] 04/07/2015 04/07/2015, 2:44 PM

## 2015-04-07 NOTE — Progress Notes (Signed)
Subjective: 1 Day Post-Op Procedure(s) (LRB): RIGHT TOTAL KNEE ARTHROPLASTY (Right)  Activity level:  wbat Diet tolerance:  ok Voiding:  ok Patient reports pain as mild.    Objective: Vital signs in last 24 hours: Temp:  [97.6 F (36.4 C)-99 F (37.2 C)] 99 F (37.2 C) (04/05 0553) Pulse Rate:  [64-89] 76 (04/05 0553) Resp:  [14-21] 18 (04/05 0553) BP: (137-166)/(71-95) 150/84 mmHg (04/05 0553) SpO2:  [96 %-100 %] 99 % (04/05 0553) Weight:  [76.204 kg (168 lb)-76.601 kg (168 lb 14 oz)] 76.204 kg (168 lb) (04/04 1731)  Labs:  Recent Labs  04/06/15 1142  HGB 12.3    Recent Labs  04/06/15 1142  WBC 7.1  RBC 4.46  HCT 38.2  PLT 205   No results for input(s): NA, K, CL, CO2, BUN, CREATININE, GLUCOSE, CALCIUM in the last 72 hours.  Recent Labs  04/06/15 1142  INR 1.15    Physical Exam:  Neurologically intact ABD soft Neurovascular intact Sensation intact distally Intact pulses distally Dorsiflexion/Plantar flexion intact Incision: dressing C/D/I and no drainage No cellulitis present Compartment soft  Assessment/Plan:  1 Day Post-Op Procedure(s) (LRB): RIGHT TOTAL KNEE ARTHROPLASTY (Right) Advance diet Up with therapy D/C IV fluids Plan for discharge tomorrow [facility] if doing well and cleared by PT. Continue on home eliquis for DVT prevention. I will change dressing to aquacel tomorrow. Follow up in office 2 weeks post op.  Jauan Wohl, Ginger OrganNDREW PAUL 04/07/2015, 7:40 AM

## 2015-04-07 NOTE — Progress Notes (Signed)
Orthopedic Tech Progress Note Patient Details:  Erma HeritageLizzie M Meininger 1940-11-22 [id number]  Patient ID: Erma HeritageLizzie M Branden, female   DOB: 1940-11-22, 75 y.o.   MRN: [id number] Maxine Glennrn nikki requested to not put on cpm. Stated the pt. had already had their am time.  [name], Aleesha Ringstad J 04/07/2015, 6:53 AM

## 2015-04-07 NOTE — Clinical Social Work Placement (Signed)
   CLINICAL SOCIAL WORK PLACEMENT  NOTE  Date:  04/07/2015  Patient Details  Name: [name] MRN: [id number] Date of Birth: [birth year]  Clinical Social Work is seeking post-discharge placement for this patient at the Skilled  Nursing Facility level of care (*CSW will initial, date and re-position this form in  chart as items are completed):      Patient/family provided with [facility] Clinical [facility] Work Department's list of facilities offering this level of care within the geographic area requested by the patient (or if unable, by the patient's family).      Patient/family informed of their freedom to choose among providers that offer the needed level of care, that participate in Medicare, Medicaid or managed care program needed by the patient, have an available bed and are willing to accept the patient.      Patient/family informed of [place]'s ownership interest in [facility] and [facility], as well as of the fact that they are under no obligation to receive care at these facilities.  PASRR submitted to EDS on 04/07/15     PASRR number received on 04/07/15     Existing PASRR number confirmed on       FL2 transmitted to all facilities in geographic area requested by pt/family on 04/07/15     FL2 transmitted to all facilities within larger geographic area on       Patient informed that his/her managed care company has contracts with or will negotiate with certain facilities, including the following:            Patient/family informed of bed offers received.  Patient chooses bed at       Physician recommends and patient chooses bed at      Patient to be transferred to   on  .  Patient to be transferred to facility by       Patient family notified on   of transfer.  Name of family member notified:        PHYSICIAN Please sign FL2     Additional Comment:    _______________________________________________ Mearl LatinNadia S Suann Klier, LCSWA 04/07/2015, 6:35 PM

## 2015-04-07 NOTE — Progress Notes (Deleted)
Orthopedic Tech Progress Note Patient Details:  [name] November 15, 1940 [id number]  Patient ID: [name], female   DOB: November 15, 1940, 75 y.o.   MRN: [id number] Applied cpm 0-35. 0-40 was to painful.  [name], Abigail Ketcher J 04/07/2015, 6:49 AM

## 2015-04-07 NOTE — Evaluation (Signed)
Physical Therapy Evaluation Patient Details Name: [name] MRN: [id number] DOB: Jul 04, 1940 Today's Date: 04/07/2015   History of Present Illness  75 y.o. female s/p right total knee arthroplasty.  Clinical Impression  Pt is s/p TKA resulting in the deficits listed below (see PT Problem List). Demonstrates antalgic gait pattern as expected post op. Good UE strength to support self with RW. Educated on precautions and safe mobility with assistive device. She takes care of her disabled husband at home. Pt will benefit from skilled PT to increase their independence and safety with mobility to allow discharge to the venue listed below.      Follow Up Recommendations SNF    Equipment Recommendations  Rolling walker with 5" wheels    Recommendations for Other Services       Precautions / Restrictions Precautions Precautions: Knee Precaution Booklet Issued: Yes (comment) Precaution Comments: reviewed Restrictions Weight Bearing Restrictions: Yes RLE Weight Bearing: Weight bearing as tolerated      Mobility  Bed Mobility Overal bed mobility: Modified Independent             General bed mobility comments: extra time. HOB elevated.  Transfers Overall transfer level: Needs assistance Equipment used: Rolling walker (2 wheeled) Transfers: Sit to/from Stand Sit to Stand: Min assist         General transfer comment: Min assist for boost to stand. Leans heavily over RW despite cues. VC for hand placement.  Ambulation/Gait Ambulation/Gait assistance: Min assist Ambulation Distance (Feet): 45 Feet Assistive device: Rolling walker (2 wheeled) Gait Pattern/deviations: Step-to pattern;Decreased step length - left;Decreased stance time - right;Antalgic;Trunk flexed Gait velocity: decreased Gait velocity interpretation: <1.8 ft/sec, indicative of risk for recurrent falls General Gait Details: educated on safe DME use with a rolling walker. VC for upright posture, forward gaze,  and right knee extension in stance phase for quad activation. Moderately antalgic but using UEs appropriately for support as needed.  Stairs            Wheelchair Mobility    Modified Rankin (Stroke Patients Only)       Balance Overall balance assessment: Needs assistance Sitting-balance support: No upper extremity supported;Feet supported Sitting balance-Leahy Scale: Good     Standing balance support: Single extremity supported Standing balance-Leahy Scale: Poor                               Pertinent Vitals/Pain Pain Assessment: 0-10 Pain Score: 10-Worst pain ever Pain Location: Rt knee Pain Descriptors / Indicators: Aching Pain Intervention(s): Monitored during session;Repositioned    Home Living Family/patient expects to be discharged to:: Skilled nursing facility Living Arrangements: Spouse/significant other Available Help at Discharge: Family (Husband is disabled from CVA) Type of Home: House Home Access: Level entry     Home Layout: One level Home Equipment: Cane - quad;Tub bench      Prior Function Level of Independence: Independent with assistive device(s)         Comments: uses quad cane, ind with ADLs, assists disabled husband.     Hand Dominance   Dominant Hand: Right    Extremity/Trunk Assessment   Upper Extremity Assessment: Defer to OT evaluation           Lower Extremity Assessment: RLE deficits/detail RLE Deficits / Details: Decreased strength and ROM as expected post op       Communication   Communication: No difficulties  Cognition Arousal/Alertness: Awake/alert Behavior During Therapy: WFL for tasks  assessed/performed Overall Cognitive Status: Within Functional Limits for tasks assessed                      General Comments      Exercises Total Joint Exercises Ankle Circles/Pumps: AROM;Both;15 reps;Seated Quad Sets: Strengthening;Both;10 reps;Seated;AROM Long [employer] Quad: Strengthening;Right;10  reps;Seated Knee Flexion: AROM;Right;10 reps;Seated      Assessment/Plan    PT Assessment Patient needs continued PT services  PT Diagnosis Difficulty walking;Abnormality of gait;Acute pain   PT Problem List Decreased strength;Decreased range of motion;Decreased activity tolerance;Decreased balance;Decreased mobility;Decreased knowledge of use of DME;Decreased knowledge of precautions;Pain  PT Treatment Interventions DME instruction;Gait training;Functional mobility training;Therapeutic activities;Therapeutic exercise;Balance training;Neuromuscular re-education;Patient/family education   PT Goals (Current goals can be found in the Care Plan section) Acute Rehab PT Goals Patient Stated Goal: Feel better so I can get back to work and help my husband at home PT Goal Formulation: With patient Time For Goal Achievement: 04/14/15 Potential to Achieve Goals: Good    Frequency 7X/week   Barriers to discharge Decreased caregiver support Cares for disabled husband    Co-evaluation               End of Session Equipment Utilized During Treatment: Gait belt Activity Tolerance: Patient tolerated treatment well Patient left: in chair;with call bell/phone within reach;with chair alarm set;with SCD's reapplied Nurse Communication: Mobility status;Patient requests pain meds         Time: [id number] PT Time Calculation (min) (ACUTE ONLY): 27 min   Charges:   PT Evaluation $PT Eval Low Complexity: 1 Procedure PT Treatments $Therapeutic Exercise: 8-22 mins   PT G Codes:        Berton MountBarbour, Oryn Casanova S 04/07/2015, 9:01 AM Charlsie MerlesLogan Secor Macdonald Rigor, PT [phone]

## 2015-04-07 NOTE — NC FL2 (Signed)
[place] MEDICAID FL2 LEVEL OF CARE SCREENING TOOL     IDENTIFICATION  Patient Name: [name] Birthdate: 1940/09/28 Sex: female Admission Date (Current Location): 04/06/2015  [facility] and [place] Number:  Producer, television/film/video and Address:  [facility] [place], [address], [place], [place] [address]      Provider Number: [id number]  Attending Physician Name and Address:  Marcene Corning, MD  Relative Name and Phone Number:  Adela Lank, daughter, [phone]    Current Level of Care: Hospital Recommended Level of Care: Skilled Nursing Facility Prior Approval Number:    Date Approved/Denied:   PASRR Number: [id number]  Discharge Plan: SNF    Current Diagnoses: Patient Active Problem List   Diagnosis Date Noted  . Primary osteoarthritis of right knee 04/06/2015  . Lichen sclerosus 02/05/2015  . Chronic constipation 11/06/2014  . Vaginal candidiasis 11/06/2014  . Atrial flutter (HCC) 05/07/2014  . Chronic rheumatic heart disease 05/05/2014  . Mitral valve regurgitation, rheumatic 05/05/2014  . Cricopharyngeal achalasia 04/08/2014  . Normocytic anemia 03/16/2014  . Sigmoid diverticulosis 03/16/2014  . Internal hemorrhoids 03/16/2014  . Obesity, Class I, BMI 30-34.9 04/26/2012  . Postmenopausal atrophic vaginitis 05/10/2011  . Preventative health care 11/07/2010  . Osteoporosis, post-menopausal 10/21/2010  . Osteoarthritis 01/12/2006  . Essential hypertension 11/20/2005    Orientation RESPIRATION BLADDER Height & Weight     Self, Time, Situation, Place  Normal Continent Weight: 168 lb (76.204 kg) Height:   (162.6 cm)  BEHAVIORAL SYMPTOMS/MOOD NEUROLOGICAL BOWEL NUTRITION STATUS      Continent Diet (Please see DC summary)  AMBULATORY STATUS COMMUNICATION OF NEEDS Skin   Extensive Assist Verbally Surgical wounds (Closed incision on knee)                       Personal Care Assistance Level of Assistance  Bathing, Feeding,  Dressing Bathing Assistance: Maximum assistance Feeding assistance: Independent Dressing Assistance: Maximum assistance     Functional Limitations Info             SPECIAL CARE FACTORS FREQUENCY  PT (By licensed PT)     PT Frequency: 7x/week              Contractures      Additional Factors Info  Code Status, Allergies Code Status Info: Full Allergies Info: Ace Inhibitors           Current Medications (04/07/2015):  This is the current hospital active medication list Current Facility-Administered Medications  Medication Dose Route Frequency Provider Last Rate Last Dose  . acetaminophen (TYLENOL) tablet 650 mg  650 mg Oral Q6H PRN Elodia Florence, PA-C       Or  . acetaminophen (TYLENOL) suppository 650 mg  650 mg Rectal Q6H PRN Elodia Florence, PA-C      . alum & mag hydroxide-simeth (MAALOX/MYLANTA) 200-200-20 MG/5ML suspension 30 mL  30 mL Oral Q4H PRN Elodia Florence, PA-C      . amLODipine (NORVASC) tablet 10 mg  10 mg Oral Daily Elodia Florence, PA-C   10 mg at 04/07/15 0841  . apixaban (ELIQUIS) tablet 5 mg  5 mg Oral BID Elodia Florence, PA-C   5 mg at 04/07/15 2536  . bisacodyl (DULCOLAX) EC tablet 10-15 mg  10-15 mg Oral Daily PRN Elodia Florence, PA-C      . diphenhydrAMINE (BENADRYL) 12.5 MG/5ML elixir 12.5-25 mg  12.5-25 mg Oral Q4H PRN Elodia Florence, PA-C      . docusate sodium (  COLACE) capsule 100 mg  100 mg Oral BID Elodia FlorenceAndrew Nida, PA-C   100 mg at 04/07/15 [id number]  . furosemide (LASIX) tablet 20 mg  20 mg Oral Daily PRN Elodia FlorenceAndrew Nida, PA-C      . HYDROcodone-acetaminophen (NORCO/VICODIN) 5-325 MG per tablet 1-2 tablet  1-2 tablet Oral Q4H PRN Elodia FlorenceAndrew Nida, PA-C   2 tablet at 04/07/15 1811  . HYDROmorphone (DILAUDID) injection 0.5-1 mg  0.5-1 mg Intravenous Q3H PRN Elodia FlorenceAndrew Nida, PA-C   1 mg at 04/07/15 1439  . lactated ringers infusion   Intravenous Continuous Marcene CorningPeter Dalldorf, MD 10 mL/hr at 04/07/15 0745    . menthol-cetylpyridinium (CEPACOL) lozenge 3 mg  1 lozenge Oral PRN Elodia FlorenceAndrew Nida,  PA-C       Or  . phenol (CHLORASEPTIC) mouth spray 1 spray  1 spray Mouth/Throat PRN Elodia FlorenceAndrew Nida, PA-C      . methocarbamol (ROBAXIN) tablet 500 mg  500 mg Oral Q6H PRN Elodia FlorenceAndrew Nida, PA-C   500 mg at 04/06/15 1702   Or  . methocarbamol (ROBAXIN) 500 mg in dextrose 5 % 50 mL IVPB  500 mg Intravenous Q6H PRN Elodia FlorenceAndrew Nida, PA-C      . metoCLOPramide (REGLAN) tablet 5-10 mg  5-10 mg Oral Q8H PRN Elodia FlorenceAndrew Nida, PA-C       Or  . metoCLOPramide (REGLAN) injection 5-10 mg  5-10 mg Intravenous Q8H PRN Elodia FlorenceAndrew Nida, PA-C      . ondansetron [facility](ZOFRAN) tablet 4 mg  4 mg Oral Q6H PRN Elodia FlorenceAndrew Nida, PA-C   4 mg at 04/06/15 1832   Or  . ondansetron (ZOFRAN) injection 4 mg  4 mg Intravenous Q6H PRN Elodia FlorenceAndrew Nida, PA-C   4 mg at 04/07/15 0615  . pantoprazole (PROTONIX) EC tablet 40 mg  40 mg Oral Daily Elodia FlorenceAndrew Nida, PA-C   40 mg at 04/07/15 [id number]  . potassium chloride (K-DUR,KLOR-CON) CR tablet 10 mEq  10 mEq Oral Daily Elodia Florencendrew Nida, PA-C   10 mEq at 04/07/15 [id number]     Discharge Medications: Please see discharge summary for a list of discharge medications.  Relevant Imaging Results:  Relevant Lab Results:   Additional Information SSN: [id number]  [place] Merry S FrankfortRayyan, [place]WA

## 2015-04-08 DIAGNOSIS — I10 Essential (primary) hypertension: Secondary | ICD-10-CM | POA: Diagnosis not present

## 2015-04-08 DIAGNOSIS — I059 Rheumatic mitral valve disease, unspecified: Secondary | ICD-10-CM | POA: Diagnosis present

## 2015-04-08 DIAGNOSIS — M1711 Unilateral primary osteoarthritis, right knee: Secondary | ICD-10-CM | POA: Diagnosis not present

## 2015-04-08 DIAGNOSIS — I051 Rheumatic mitral insufficiency: Secondary | ICD-10-CM | POA: Diagnosis not present

## 2015-04-08 DIAGNOSIS — K5900 Constipation, unspecified: Secondary | ICD-10-CM | POA: Diagnosis not present

## 2015-04-08 DIAGNOSIS — R2681 Unsteadiness on feet: Secondary | ICD-10-CM | POA: Diagnosis not present

## 2015-04-08 DIAGNOSIS — R262 Difficulty in walking, not elsewhere classified: Secondary | ICD-10-CM | POA: Diagnosis not present

## 2015-04-08 DIAGNOSIS — I350 Nonrheumatic aortic (valve) stenosis: Secondary | ICD-10-CM | POA: Diagnosis not present

## 2015-04-08 DIAGNOSIS — I481 Persistent atrial fibrillation: Secondary | ICD-10-CM | POA: Diagnosis not present

## 2015-04-08 DIAGNOSIS — M25561 Pain in right knee: Secondary | ICD-10-CM | POA: Diagnosis not present

## 2015-04-08 DIAGNOSIS — M25569 Pain in unspecified knee: Secondary | ICD-10-CM | POA: Diagnosis not present

## 2015-04-08 DIAGNOSIS — R600 Localized edema: Secondary | ICD-10-CM | POA: Diagnosis not present

## 2015-04-08 DIAGNOSIS — M6281 Muscle weakness (generalized): Secondary | ICD-10-CM | POA: Diagnosis not present

## 2015-04-08 DIAGNOSIS — E785 Hyperlipidemia, unspecified: Secondary | ICD-10-CM | POA: Diagnosis not present

## 2015-04-08 DIAGNOSIS — M8000XA Age-related osteoporosis with current pathological fracture, unspecified site, initial encounter for fracture: Secondary | ICD-10-CM | POA: Diagnosis not present

## 2015-04-08 DIAGNOSIS — Z96659 Presence of unspecified artificial knee joint: Secondary | ICD-10-CM | POA: Diagnosis not present

## 2015-04-08 DIAGNOSIS — K219 Gastro-esophageal reflux disease without esophagitis: Secondary | ICD-10-CM | POA: Diagnosis not present

## 2015-04-08 DIAGNOSIS — I119 Hypertensive heart disease without heart failure: Secondary | ICD-10-CM | POA: Diagnosis not present

## 2015-04-08 DIAGNOSIS — I340 Nonrheumatic mitral (valve) insufficiency: Secondary | ICD-10-CM | POA: Diagnosis not present

## 2015-04-08 DIAGNOSIS — Z96651 Presence of right artificial knee joint: Secondary | ICD-10-CM | POA: Diagnosis not present

## 2015-04-08 DIAGNOSIS — Z4789 Encounter for other orthopedic aftercare: Secondary | ICD-10-CM | POA: Diagnosis not present

## 2015-04-08 MED ORDER — HYDROCODONE-ACETAMINOPHEN 5-325 MG PO TABS: 1.0000 | ORAL_TABLET | ORAL | Status: DC | PRN

## 2015-04-08 MED ORDER — METHOCARBAMOL 500 MG PO TABS: 500.0000 mg | ORAL_TABLET | Freq: Four times a day (QID) | ORAL | Status: DC | PRN

## 2015-04-08 NOTE — Care Management Note (Addendum)
Case Management Note  Patient Details  Name: [name] MRN: [id number] Date of Birth: [birth year]  Subjective/Objective:              Admitted with osteoarthritis of  R knee, s/p  RIGHT TOTAL KNEE ARTHROPLASTY.  Action/Plan: Plan to d/c to SNF today. No further needs identified per CM.  Expected Discharge Date:                  Expected Discharge Plan:  Skilled Nursing Facility  In-House Referral:  Clinical Social Work  Discharge planning Services  CM Consult  Post Acute Care Choice:  NA Choice offered to:  Patient, Adult Children  DME Arranged:  CPM (ARRANGED PRIOR TO HOSPITALIZATION) DME Agency:   ([facility])  HH Arranged:    HH Agency:     Status of Service:  Completed, signed off   Epifanio LeschesCole, [name], ArizonaRN,BSN,CM [phone] 04/08/2015, 3:39 PM

## 2015-04-08 NOTE — Progress Notes (Signed)
Orthopedic Tech Progress Note Patient Details:  Erma HeritageLizzie M Sylvester 21-Apr-1940 [id number]  Patient ID: Erma HeritageLizzie M Tsou, female   DOB: 21-Apr-1940, 75 y.o.   MRN: [id number] Patient refused cpm.  [name], Madisin Hasan J 04/08/2015, 6:27 AM

## 2015-04-08 NOTE — Discharge Instructions (Signed)
Information on my medicine - ELIQUIS® (apixaban) ° °This medication education was reviewed with me or my healthcare representative as part of my discharge preparation.  The pharmacist that spoke with me during my hospital stay was:  Kaynen Minner P, RPH ° °Why was Eliquis® prescribed for you? °Eliquis® was prescribed for you to reduce the risk of forming blood clots that can cause a stroke if you have a medical condition called atrial fibrillation (a type of irregular heartbeat) OR to reduce the risk of a blood clots forming after orthopedic surgery. ° °What do You need to know about Eliquis® ? °Take your Eliquis® TWICE DAILY - one tablet in the morning and one tablet in the evening with or without food.  It would be best to take the doses about the same time each day. ° °If you have difficulty swallowing the tablet whole please discuss with your pharmacist how to take the medication safely. ° °Take Eliquis® exactly as prescribed by your doctor and DO NOT stop taking Eliquis® without talking to the doctor who prescribed the medication.  Stopping may increase your risk of developing a new clot or stroke.  Refill your prescription before you run out. ° °After discharge, you should have regular check-up appointments with your healthcare provider that is prescribing your Eliquis®.  In the future your dose may need to be changed if your kidney function or weight changes by a significant amount or as you get older. ° °What do you do if you miss a dose? °If you miss a dose, take it as soon as you remember on the same day and resume taking twice daily.  Do not take more than one dose of ELIQUIS at the same time. ° °Important Safety Information °A possible side effect of Eliquis® is bleeding. You should call your healthcare provider right away if you experience any of the following: °? Bleeding from an injury or your nose that does not stop. °? Unusual colored urine (red or dark brown) or unusual colored stools (red or  black). °? Unusual bruising for unknown reasons. °? A serious fall or if you hit your head (even if there is no bleeding). ° °Some medicines may interact with Eliquis® and might increase your risk of bleeding or clotting while on Eliquis®. To help avoid this, consult your healthcare provider or pharmacist prior to using any new prescription or non-prescription medications, including herbals, vitamins, non-steroidal anti-inflammatory drugs (NSAIDs) and supplements. ° °This website has more information on Eliquis® (apixaban): www.Eliquis.com. ° ° °

## 2015-04-08 NOTE — Discharge Summary (Signed)
Patient ID: [name] MRN: [id number] DOB/AGE: Sep 23, 1940 75 y.o.  Admit date: 04/06/2015 Discharge date: 04/08/2015  Admission Diagnoses:  Principal Problem:   Primary osteoarthritis of right knee   Discharge Diagnoses:  Same  Past Medical History  Diagnosis Date  . Essential hypertension   . Osteoarthritis     knees and hands  . Systolic murmur     per patient report she has had rheumatic fever in childhood  . Osteoporosis, post-menopausal 2010    left femur T -2.7, left forearm T -3.5  . Blood transfusion without reported diagnosis     1960's, unknown reason  . Post-menopausal atrophic vaginitis   . Obesity, Class I, BMI 30-34.9   . Anemia 03/16/2014  . Cricopharyngeal achalasia 04/08/2014    Rigid esophagoscopy, dilation, and Botox injection into cricopharyngeus muscle recommended   . Chronic rheumatic heart disease 05/05/2014  . Mitral valve regurgitation, rheumatic 05/05/2014    Moderate to severe with peak PA pressure of 56 mmHg on Echo 04/2014   . Atrial flutter (HCC) 05/07/2014    With controlled ventricular rate on no AV nodal agents   . PONV (postoperative nausea and vomiting)     with recent dilatation of throat, had N/V    Surgeries: Procedure(s): RIGHT TOTAL KNEE ARTHROPLASTY on 04/06/2015 - 04/08/2015   Consultants:    Discharged Condition: Improved  Hospital Course: [name] is an 75 y.o. female who was admitted 04/06/2015 for operative treatment ofPrimary osteoarthritis of right knee. Patient has severe unremitting pain that affects sleep, daily activities, and work/hobbies. After pre-op clearance the patient was taken to the operating room on 04/06/2015 - 04/08/2015 and underwent  Procedure(s): RIGHT TOTAL KNEE ARTHROPLASTY.    Patient was given perioperative antibiotics: Anti-infectives    Start     Dose/Rate Route Frequency Ordered Stop   04/06/15 1815  ceFAZolin (ANCEF) IVPB 2g/100 mL premix     2 g 200 mL/hr over 30 Minutes Intravenous Every 6 hours  04/06/15 1812 04/07/15 0614   04/06/15 1230  ceFAZolin (ANCEF) IVPB 2g/100 mL premix     2 g 200 mL/hr over 30 Minutes Intravenous To ShortStay Surgical 04/05/15 1333 04/06/15 1355       Patient was given sequential compression devices, early ambulation, and chemoprophylaxis to prevent DVT.  Patient benefited maximally from hospital stay and there were no complications.    Recent vital signs: Patient Vitals for the past 24 hrs:  BP Temp Temp src Pulse Resp SpO2  04/08/15 1027 129/67 mmHg 98.2 F (36.8 C) - 85 18 92 %  04/08/15 0802 124/77 mmHg - - 75 - -  04/08/15 0658 137/84 mmHg 98.8 F (37.1 C) Oral 77 18 98 %  04/07/15 2310 (!) 149/88 mmHg 99.9 F (37.7 C) Oral 85 18 97 %  04/07/15 1430 137/82 mmHg 98.8 F (37.1 C) Oral 84 18 97 %     Recent laboratory studies:  Recent Labs  04/06/15 1142  WBC 7.1  HGB 12.3  HCT 38.2  PLT 205  INR 1.15     Discharge Medications:     Medication List    TAKE these medications        amLODipine 10 MG tablet  Commonly known as:  NORVASC  Take 1 tablet (10 mg total) by mouth daily.     apixaban 5 MG Tabs tablet  Commonly known as:  ELIQUIS  Take 1 tablet (5 mg total) by mouth 2 (two) times daily.     bisacodyl  5 MG EC tablet  Generic drug:  bisacodyl  Take 10-15 mg by mouth daily as needed (constipation).     TUMS ULTRA 1000 400 MG chewable tablet  Generic drug:  calcium elemental as carbonate  Chew 1,000 mg by mouth 2 (two) times daily as needed for heartburn.     calcium carbonate 500 MG chewable tablet  Commonly known as:  TUMS  Chew 1 tablet (200 mg of elemental calcium total) by mouth daily.     clobetasol ointment 0.05 %  Commonly known as:  TEMOVATE  Apply 1 application topically daily as needed (for skin irritation).     furosemide 20 MG tablet  Commonly known as:  LASIX  TAKE 1 TABLET(20 MG) BY MOUTH DAILY AS NEEDED FOR FLUID RETENTION     HYDROcodone-acetaminophen 5-325 MG tablet  Commonly known as:   NORCO/VICODIN  Take 1-2 tablets by mouth every 4 (four) hours as needed (breakthrough pain).     methocarbamol 500 MG tablet  Commonly known as:  ROBAXIN  Take 1 tablet (500 mg total) by mouth every 6 (six) hours as needed for muscle spasms.     pantoprazole 40 MG tablet  Commonly known as:  PROTONIX  TAKE 1 TABLET(40 MG) BY MOUTH DAILY     potassium chloride 10 MEQ tablet  Commonly known as:  K-DUR  Take 1 tablet (10 mEq total) by mouth 2 (two) times daily. For one week than as needed with Lasix     potassium chloride 10 MEQ tablet  Commonly known as:  K-DUR  Take 1 tablet (10 mEq total) by mouth 2 (two) times daily.     traMADol 50 MG tablet  Commonly known as:  ULTRAM  Take 2 tablets (100 mg total) by mouth every 6 (six) hours as needed for severe pain (pain).        Diagnostic Studies: Dg Chest 2 View  04/06/2015  CLINICAL DATA:  Preop for right knee arthroplasty. EXAM: CHEST - 2 VIEW COMPARISON:  Two-view chest x-ray 03/16/2014. FINDINGS: The heart size is upper limits of normal. There is mild prominence of the aorta fluid there is no edema or effusion to suggest failure. No focal airspace disease present. The axial skeleton is unremarkable. IMPRESSION: 1. Stable borderline cardiomegaly without failure. 2. Stable mild prominence of the aorta. 3. No acute abnormality. Electronically Signed   By: Marin Roberts M.D.   On: 04/06/2015 11:57    Disposition: 01-Home or Self Care      Discharge Instructions    Call MD / Call 911    Complete by:  As directed   If you experience chest pain or shortness of breath, CALL 911 and be transported to the hospital emergency room.  If you develope a fever above 101 F, pus (white drainage) or increased drainage or redness at the wound, or calf pain, call your surgeon's office.     Constipation Prevention    Complete by:  As directed   Drink plenty of fluids.  Prune juice may be helpful.  You may use a stool softener, such as Colace  (over the counter) 100 mg twice a day.  Use MiraLax (over the counter) for constipation as needed.     Diet - low sodium heart healthy    Complete by:  As directed      Discharge instructions    Complete by:  As directed   INSTRUCTIONS AFTER JOINT REPLACEMENT   Remove items at home which could result in a  fall. This includes throw rugs or furniture in walking pathways ICE to the affected joint every three hours while awake for 30 minutes at a time, for at least the first 3-5 days, and then as needed for pain and swelling.  Continue to use ice for pain and swelling. You may notice swelling that will progress down to the foot and ankle.  This is normal after surgery.  Elevate your leg when you are not up walking on it.   Continue to use the breathing machine you got in the hospital (incentive spirometer) which will help keep your temperature down.  It is common for your temperature to cycle up and down following surgery, especially at night when you are not up moving around and exerting yourself.  The breathing machine keeps your lungs expanded and your temperature down.   DIET:  As you were doing prior to hospitalization, we recommend a well-balanced diet.  DRESSING / WOUND CARE / SHOWERING  You may shower 3 days after surgery, but keep the wounds dry during showering.  You may use an occlusive plastic wrap (Press'n Seal for example), NO SOAKING/SUBMERGING IN THE BATHTUB.  If the bandage gets wet, change with a clean dry gauze.  If the incision gets wet, pat the wound dry with a clean towel.  ACTIVITY  Increase activity slowly as tolerated, but follow the weight bearing instructions below.   No driving for 6 weeks or until further direction given by your physician.  You cannot drive while taking narcotics.  No lifting or carrying greater than 10 lbs. until further directed by your surgeon. Avoid periods of inactivity such as sitting longer than an hour when not asleep. This helps prevent blood  clots.  You may return to work once you are authorized by your doctor.     WEIGHT BEARING   Weight bearing as tolerated with assist device (walker, cane, etc) as directed, use it as long as suggested by your surgeon or therapist, typically at least 4-6 weeks.   EXERCISES  Results after joint replacement surgery are often greatly improved when you follow the exercise, range of motion and muscle strengthening exercises prescribed by your doctor. Safety measures are also important to protect the joint from further injury. Any time any of these exercises cause you to have increased pain or swelling, decrease what you are doing until you are comfortable again and then slowly increase them. If you have problems or questions, call your caregiver or physical therapist for advice.   Rehabilitation is important following a joint replacement. After just a few days of immobilization, the muscles of the leg can become weakened and shrink (atrophy).  These exercises are designed to build up the tone and strength of the thigh and leg muscles and to improve motion. Often times heat used for twenty to thirty minutes before working out will loosen up your tissues and help with improving the range of motion but do not use heat for the first two weeks following surgery (sometimes heat can increase post-operative swelling).   These exercises can be done on a training (exercise) mat, on the floor, on a table or on a bed. Use whatever works the best and is most comfortable for you.    Use music or television while you are exercising so that the exercises are a pleasant break in your day. This will make your life better with the exercises acting as a break in your routine that you can look forward to.   Perform  all exercises about fifteen times, three times per day or as directed.  You should exercise both the operative leg and the other leg as well.   Exercises include:   Quad Sets - Tighten up the muscle on the front  of the thigh (Quad) and hold for 5-10 seconds.   Straight Leg Raises - With your knee straight (if you were given a brace, keep it on), lift the leg to 60 degrees, hold for 3 seconds, and slowly lower the leg.  Perform this exercise against resistance later as your leg gets stronger.  Leg Slides: Lying on your back, slowly slide your foot toward your buttocks, bending your knee up off the floor (only go as far as is comfortable). Then slowly slide your foot back down until your leg is flat on the floor again.  Angel Wings: Lying on your back spread your legs to the side as far apart as you can without causing discomfort.  Hamstring Strength:  Lying on your back, push your heel against the floor with your leg straight by tightening up the muscles of your buttocks.  Repeat, but this time bend your knee to a comfortable angle, and push your heel against the floor.  You may put a pillow under the heel to make it more comfortable if necessary.   A rehabilitation program following joint replacement surgery can speed recovery and prevent re-injury in the future due to weakened muscles. Contact your doctor or a physical therapist for more information on knee rehabilitation.    CONSTIPATION  Constipation is defined medically as fewer than three stools per week and severe constipation as less than one stool per week.  Even if you have a regular bowel pattern at home, your normal regimen is likely to be disrupted due to multiple reasons following surgery.  Combination of anesthesia, postoperative narcotics, change in appetite and fluid intake all can affect your bowels.   YOU MUST use at least one of the following options; they are listed in order of increasing strength to get the job done.  They are all available over the counter, and you may need to use some, POSSIBLY even all of these options:    Drink plenty of fluids (prune juice may be helpful) and high fiber foods Colace 100 mg by mouth twice a day   Senokot for constipation as directed and as needed Dulcolax (bisacodyl), take with full glass of water  Miralax (polyethylene glycol) once or twice a day as needed.  If you have tried all these things and are unable to have a bowel movement in the first 3-4 days after surgery call either your surgeon or your primary doctor.    If you experience loose stools or diarrhea, hold the medications until you stool forms back up.  If your symptoms do not get better within 1 week or if they get worse, check with your doctor.  If you experience "the worst abdominal pain ever" or develop nausea or vomiting, please contact the office immediately for further recommendations for treatment.   ITCHING:  If you experience itching with your medications, try taking only a single pain pill, or even half a pain pill at a time.  You can also use Benadryl over the counter for itching or also to help with sleep.   TED HOSE STOCKINGS:  Use stockings on both legs until for at least 2 weeks or as directed by physician office. They may be removed at night for sleeping.  MEDICATIONS:  See  your medication summary on the "After Visit Summary" that nursing will review with you.  You may have some home medications which will be placed on hold until you complete the course of blood thinner medication.  It is important for you to complete the blood thinner medication as prescribed.  PRECAUTIONS:  If you experience chest pain or shortness of breath - call 911 immediately for transfer to the hospital emergency department.   If you develop a fever greater that 101 F, purulent drainage from wound, increased redness or drainage from wound, foul odor from the wound/dressing, or calf pain - CONTACT YOUR SURGEON.                                                   FOLLOW-UP APPOINTMENTS:  If you do not already have a post-op appointment, please call the office for an appointment to be seen by your surgeon.  Guidelines for how soon to be seen  are listed in your "After Visit Summary", but are typically between 1-4 weeks after surgery.  OTHER INSTRUCTIONS:   Knee Replacement:  Do not place pillow under knee, focus on keeping the knee straight while resting. CPM instructions: 0-90 degrees, 2 hours in the morning, 2 hours in the afternoon, and 2 hours in the evening. Place foam block, curve side up under heel at all times except when in CPM or when walking.  DO NOT modify, tear, cut, or change the foam block in any way.  MAKE SURE YOU:  Understand these instructions.  Get help right away if you are not doing well or get worse.    Thank you for letting us be a part of your medical care team.  It is a privilege we respect greatly.  We hope these instructions will help you stay on track for a fast and full recovery!     Increase activity slowly as tolerated    Complete by:  As directed            Follow-up Information    Follow up with Velna OchsALLDORF,PETER G, MD. Schedule an appointment as soon as possible for a visit in 2 weeks.   Specialty:  Orthopedic Surgery   Contact information:   [address] [place] [id number] [phone]        Signed: Drema HalonIDA, Clemence Lengyel PAUL 04/08/2015, 2:22 PM

## 2015-04-08 NOTE — Progress Notes (Signed)
Orthopedic Tech Progress Note Patient Details:  [name] 12-Mar-1940 [id number]  Patient ID: [name], female   DOB: 12-Mar-1940, 75 y.o.   MRN: [id number] Placed pt's rle on cpm@ 0-50 degrees @1115   Nikki DomCrawford, Greogory Cornette 04/08/2015, 11:18 AM

## 2015-04-08 NOTE — Progress Notes (Signed)
Physical Therapy Treatment Patient Details Name: [name] MRN: [id number] DOB: [birth year] Today's Date: 04/08/2015    History of Present Illness 75 y.o. female s/p right total knee arthroplasty.    PT Comments    Pt making steady progress with mobility. Continue to plan for pt to continue rehab at [facility].  Follow Up Recommendations  SNF     Equipment Recommendations  Rolling walker with 5" wheels    Recommendations for Other Services       Precautions / Restrictions Precautions Precautions: Knee Precaution Booklet Issued: Yes (comment) Precaution Comments: reviewed Restrictions Weight Bearing Restrictions: Yes RLE Weight Bearing: Weight bearing as tolerated    Mobility  Bed Mobility Overal bed mobility: Needs Assistance Bed Mobility: Supine to Sit     Supine to sit: Min assist     General bed mobility comments: Assist to bring RLE off of bed  Transfers Overall transfer level: Needs assistance Equipment used: Rolling walker (2 wheeled) Transfers: Sit to/from Stand Sit to Stand: Min assist         General transfer comment: Cues for hand placement. Assist to bring hips up.  Ambulation/Gait Ambulation/Gait assistance: Min assist Ambulation Distance (Feet): 60 Feet Assistive device: Rolling walker (2 wheeled) Gait Pattern/deviations: Step-to pattern;Decreased step length - left;Decreased stance time - right;Antalgic Gait velocity: decreased Gait velocity interpretation: Below normal speed for age/gender General Gait Details: Verbal cues for walker use with turning and backing up to chair. Assist for balance and support.   Stairs            Wheelchair Mobility    Modified Rankin (Stroke Patients Only)       Balance Overall balance assessment: Needs assistance Sitting-balance support: No upper extremity supported;Feet supported Sitting balance-Leahy Scale: Good     Standing balance support: Single extremity supported Standing  balance-Leahy Scale: Poor Standing balance comment: support of walker and min guard                    Cognition Arousal/Alertness: Awake/alert Behavior During Therapy: WFL for tasks assessed/performed Overall Cognitive Status: Within Functional Limits for tasks assessed                      Exercises Total Joint Exercises Ankle Circles/Pumps: AROM;Both;15 reps;Supine Quad Sets: Strengthening;10 reps;AROM;Right;Supine Heel Slides: AAROM;Right;10 reps;Supine Hip ABduction/ADduction: AAROM;Right;10 reps;Supine Straight Leg Raises: AAROM;Right;10 reps;Supine Long Arc Quad: Strengthening;Right;10 reps;AAROM;Seated Knee Flexion: AROM;Right;Seated;5 reps Goniometric ROM: 5-75    General Comments        Pertinent Vitals/Pain Pain Assessment: 0-10 Pain Score: 5  Pain Descriptors / Indicators: Aching;Sore Pain Intervention(s): Limited activity within patient's tolerance;Premedicated before session;Monitored during session;Repositioned    Home Living                      Prior Function            PT Goals (current goals can now be found in the care plan section) Progress towards PT goals: Progressing toward goals    Frequency  7X/week    PT Plan Current plan remains appropriate    Co-evaluation             End of Session Equipment Utilized During Treatment: Gait belt Activity Tolerance: Patient tolerated treatment well Patient left: in chair;with call bell/phone within reach;with chair alarm set     Time: 4098-1191 PT Time Calculation (min) (ACUTE ONLY): 31 min  Charges:  $Gait Training: 8-22 mins $Therapeutic Exercise: 8-22 mins  G Codes:      Anja Neuzil 04/08/2015, 2:42 PM [facility] [phone]

## 2015-04-08 NOTE — Clinical Social Work Placement (Signed)
   CLINICAL SOCIAL WORK PLACEMENT  NOTE  Date:  04/08/2015  Patient Details  Name: [name] MRN: [id number] Date of Birth: 06-28-1940  Clinical Social Work is seeking post-discharge placement for this patient at the Skilled  Nursing Facility level of care (*CSW will initial, date and re-position this form in  chart as items are completed):      Patient/family provided with [facility] Clinical [facility] Work Department's list of facilities offering this level of care within the geographic area requested by the patient (or if unable, by the patient's family).      Patient/family informed of their freedom to choose among providers that offer the needed level of care, that participate in Medicare, Medicaid or managed care program needed by the patient, have an available bed and are willing to accept the patient.      Patient/family informed of [facility]'s ownership interest in [facility] and [facility], as well as of the fact that they are under no obligation to receive care at these facilities.  PASRR submitted to EDS on 04/07/15     PASRR number received on 04/07/15     Existing PASRR number confirmed on       FL2 transmitted to all facilities in geographic area requested by pt/family on 04/07/15     FL2 transmitted to all facilities within larger geographic area on       Patient informed that his/her managed care company has contracts with or will negotiate with certain facilities, including the following:            Patient/family informed of bed offers received.  [facility]     Physician recommends and patient chooses bed at      [facility].  Patient to be transferred to facility by ptar     Patient family notified on 04/08/15 of transfer.  Name of family member notified:  jacqueline     PHYSICIAN Please sign FL2     Additional Comment:     _______________________________________________ Izora RibasHoloman, Jadelin Eng M, LCSW 04/08/2015, 3:23 PM

## 2015-04-09 ENCOUNTER — Non-Acute Institutional Stay (SKILLED_NURSING_FACILITY): Payer: Medicare Other | Admitting: Internal Medicine

## 2015-04-09 ENCOUNTER — Encounter: Payer: Self-pay | Admitting: Internal Medicine

## 2015-04-09 DIAGNOSIS — R2681 Unsteadiness on feet: Secondary | ICD-10-CM

## 2015-04-09 DIAGNOSIS — K219 Gastro-esophageal reflux disease without esophagitis: Secondary | ICD-10-CM

## 2015-04-09 DIAGNOSIS — R600 Localized edema: Secondary | ICD-10-CM

## 2015-04-09 DIAGNOSIS — K5900 Constipation, unspecified: Secondary | ICD-10-CM | POA: Diagnosis not present

## 2015-04-09 DIAGNOSIS — M1711 Unilateral primary osteoarthritis, right knee: Secondary | ICD-10-CM

## 2015-04-09 DIAGNOSIS — I10 Essential (primary) hypertension: Secondary | ICD-10-CM | POA: Diagnosis not present

## 2015-04-09 NOTE — Progress Notes (Signed)
LOCATION: [facility]  PCP: Rocco Serene, MD   Code Status: Full Code  Goals of care: Advanced Directive information Advanced Directives 04/07/2015  Does patient have an advance directive? No  Would patient like information on creating an advanced directive? No - patient declined information       Extended Emergency Contact Information Primary Emergency Contact: [place] Home Phone: [phone] Mobile Phone: [phone] Relation: Daughter   Allergies  Allergen Reactions  . Ace Inhibitors Cough    Chief Complaint  Patient presents with  . New Admit To SNF    New Admission     HPI:  Patient is a 75 y.o. female seen today for short term rehabilitation post hospital admission from 04/06/15-04/08/15 with right knee OA. she underwent right total knee arthroplasty. She is seen in her room today. Her pain is under control with current pain regimen. She has been constipated. Denies any other concerns. Her grand daughter is present at bedside.   Review of Systems:  Constitutional: Negative for fever, chills, diaphoresis. Energy is slowly returning.  HENT: Negative for headache, congestion, nasal discharge, hearing loss, sore throat, difficulty swallowing.   Eyes: Negative for blurred vision, double vision and discharge.  Respiratory: Negative for cough, shortness of breath and wheezing.   Cardiovascular: Negative for chest pain, palpitations, leg swelling.  Gastrointestinal: Negative for heartburn, nausea, vomiting, abdominal pain. Last bowel movement was Tuesday. Passing gas.  Genitourinary: Negative for dysuria and flank pain.  Musculoskeletal: Negative for back pain, fall in the facility.  Skin: Negative for itching, rash.  Neurological: Negative for weakness and dizziness. Psychiatric/Behavioral: Negative for depression.   Past Medical History  Diagnosis Date  . Essential hypertension   . Osteoarthritis     knees and hands    . Systolic murmur     per patient report she has had rheumatic fever in childhood  . Osteoporosis, post-menopausal 2010    left femur T -2.7, left forearm T -3.5  . Blood transfusion without reported diagnosis     1960's, unknown reason  . Post-menopausal atrophic vaginitis   . Obesity, Class I, BMI 30-34.9   . Anemia 03/16/2014  . Cricopharyngeal achalasia 04/08/2014    Rigid esophagoscopy, dilation, and Botox injection into cricopharyngeus muscle recommended   . Chronic rheumatic heart disease 05/05/2014  . Mitral valve regurgitation, rheumatic 05/05/2014    Moderate to severe with peak PA pressure of 56 mmHg on Echo 04/2014   . Atrial flutter (HCC) 05/07/2014    With controlled ventricular rate on no AV nodal agents   . PONV (postoperative nausea and vomiting)     with recent dilatation of throat, had N/V   Past Surgical History  Procedure Laterality Date  . Abdominal hysterectomy      Fibroids  . Cyst excision      on hand  . Colonoscopy w/ biopsies and polypectomy    . Multiple tooth extractions    . Esophagoscopy with dilitation N/A 04/14/2014    Procedure: RIGID ESOPHAGOSCOPY WITH DILITATION AND BOTOX  INJECTION;  Surgeon: Osborn Coho, MD;  Location: [facility] OR;  Service: ENT;  Laterality: N/A;  . Total knee arthroplasty Right 04/06/2015  . Total knee arthroplasty Right 04/06/2015    Procedure: RIGHT TOTAL KNEE ARTHROPLASTY;  Surgeon: Marcene Corning, MD;  Location: [facility] OR;  Service: Orthopedics;  Laterality: Right;   Social History:   reports that she has never smoked. She has never used smokeless tobacco. She reports that  she does not drink alcohol or use illicit drugs.  Family History  Problem Relation Age of Onset  . Cancer Mother     Unknown type  . Stroke Father   . Unexplained death Sister   . Other Brother     Unknown health  . Healthy Daughter   . Healthy Son   . Asthma Sister   . Other Sister     Unknown health  . Other Brother     Unknown health  . Other Brother      Unknown health  . Cancer Brother     Unknown type  . Cancer Brother     Unknown type  . Other Brother     Accident  . Other Brother     Garment/textile technologistndustrial accident  . Healthy Daughter   . Healthy Daughter     Medications:   Medication List       This list is accurate as of: 04/09/15  4:23 PM.  Always use your most recent med list.               amLODipine 10 MG tablet  Commonly known as:  NORVASC  Take 1 tablet (10 mg total) by mouth daily.     apixaban 5 MG Tabs tablet  Commonly known as:  ELIQUIS  Take 1 tablet (5 mg total) by mouth 2 (two) times daily.     bisacodyl 5 MG EC tablet  Generic drug:  bisacodyl  Take 10 mg by mouth daily as needed (constipation).     calcium carbonate 500 MG chewable tablet  Commonly known as:  TUMS - dosed in mg elemental calcium  Chew 2 tablets by mouth 2 (two) times daily as needed for indigestion or heartburn.     calcium carbonate 500 MG chewable tablet  Commonly known as:  TUMS  Chew 1 tablet (200 mg of elemental calcium total) by mouth daily.     clobetasol ointment 0.05 %  Commonly known as:  TEMOVATE  Apply 1 application topically daily as needed (for skin irritation).     docusate sodium 100 MG capsule  Commonly known as:  COLACE  Take 100 mg by mouth 2 (two) times daily as needed.     furosemide 20 MG tablet  Commonly known as:  LASIX  TAKE 1 TABLET(20 MG) BY MOUTH DAILY AS NEEDED FOR FLUID RETENTION     HYDROcodone-acetaminophen 5-325 MG tablet  Commonly known as:  NORCO/VICODIN  Take 1-2 tablets by mouth every 4 (four) hours as needed (breakthrough pain).     methocarbamol 500 MG tablet  Commonly known as:  ROBAXIN  Take 1 tablet (500 mg total) by mouth every 6 (six) hours as needed for muscle spasms.     pantoprazole 40 MG tablet  Commonly known as:  PROTONIX  TAKE 1 TABLET(40 MG) BY MOUTH DAILY     polyethylene glycol packet  Commonly known as:  MIRALAX / GLYCOLAX  Take 17 g by mouth daily as needed.      potassium chloride 10 MEQ tablet  Commonly known as:  K-DUR  Take 10 mEq by mouth every 12 (twelve) hours as needed (When lasix is given prn for fluid retention).     potassium chloride 10 MEQ tablet  Commonly known as:  K-DUR  Take 10 mEq by mouth 2 (two) times daily. Stop date 04/15/15     traMADol 50 MG tablet  Commonly known as:  ULTRAM  Take 50 mg by mouth every 6 (  six) hours as needed for severe pain (Take 2 tablets = ).        Immunizations: Immunization History  Administered Date(s) Administered  . Influenza Split 10/21/2010  . Influenza,inj,Quad PF,36+ Mos 10/08/2014  . PPD Test 04/08/2015  . Pneumococcal Conjugate-13 05/22/2014  . Pneumococcal Polysaccharide-23 03/11/2012  . Tdap 03/11/2012     Physical Exam:  Filed Vitals:   04/09/15 1600  BP: 142/98  Pulse: 78  Temp: 98.2 F (36.8 C)  TempSrc: Oral  Resp: 16  Height:  (1.626 m)  Weight: 168 lb (76.204 kg)  SpO2: 97%   Body mass index is 28.82 kg/(m^2).  General- elderly female, overweight, in no acute distress Head- normocephalic, atraumatic Nose- no maxillary or frontal sinus tenderness, no nasal discharge Throat- moist mucus membrane, dentures present Eyes- PERRLA, EOMI, no pallor, no icterus, no discharge, normal conjunctiva, normal sclera Neck- no cervical lymphadenopathy Cardiovascular- normal s1,s2, no murmur, trace leg edema Respiratory- bilateral clear to auscultation, no wheeze, no rhonchi, no crackles, no use of accessory muscles Abdomen- bowel sounds present, soft, non tender Musculoskeletal- able to move all 4 extremities, limited right knee range of motion Neurological- alert and oriented to person, place and time Skin- warm and dry, right knee surgical incision with aquacel dressing Psychiatry- normal mood and affect    Labs reviewed: Basic Metabolic Panel:  Recent Labs  16/10/96 1513 03/08/15 1448 03/26/15 03/26/15 1550  NA 138 137 138 138  K 3.3* 3.4*  --  3.5    CL 103 103  --  104  CO2 28 25  --  22  GLUCOSE 102* 114*  --  105*  BUN CREATININE 0.77 0.98* 0.7 0.71  CALCIUM 9.2 9.1  --  9.4   Liver Function Tests:  Recent Labs  02/16/15 1513  AST 19  ALT 14  ALKPHOS 91  BILITOT 0.5  PROT 6.9  ALBUMIN 3.7   No results for input(s): LIPASE, AMYLASE in the last 8760 hours. No results for input(s): AMMONIA in the last 8760 hours. CBC:  Recent Labs  05/05/14 1247 02/16/15 1513 03/26/15 1550 04/06/15 04/06/15 1142  WBC 10.5 8.5 9.3 7.1 7.1  NEUTROABS 6.7  --   --   --  5.0  HGB 10.8* 12.9 14.0  --  12.3  HCT 33.7* 39.3 43.4  --  38.2  MCV 76.1* 81.9 85.9  --  85.7  PLT 390.0 285 244  --  205    Radiological Exams: Dg Chest 2 View  04/06/2015  CLINICAL DATA:  Preop for right knee arthroplasty. EXAM: CHEST - 2 VIEW COMPARISON:  Two-view chest x-ray 03/16/2014. FINDINGS: The heart size is upper limits of normal. There is mild prominence of the aorta fluid there is no edema or effusion to suggest failure. No focal airspace disease present. The axial skeleton is unremarkable. IMPRESSION: 1. Stable borderline cardiomegaly without failure. 2. Stable mild prominence of the aorta. 3. No acute abnormality. Electronically Signed   By: Marin Roberts M.D.   On: 04/06/2015 11:57    Assessment/Plan  Unsteady gait Will have patient work with PT/OT as tolerated to regain strength and restore function.  Fall precautions are in place.  Right knee OA S/p right total knee arthroplasty. Continue norco 5-325 mg 1-2 tab q4h prn pain and tramadol 100 mg q6h prn pain. Continue apixaban for dvt prophylaxis. Has follow up with orthopedics. Continue calcium supplement. Will have her work with physical therapy and occupational therapy team  to help with gait training and muscle strengthening exercises.fall precautions. Skin care. Encourage to be out of bed. Continue robaxin 500 mg q6h prn muscle spasm.  HTN Has elevated BP reading, check bp  bid x 1 week. Continue amlodipine 10 mg daily  Constipation Has been constipated. Change miralax to 17 g daily and d/c colace. Start senna s 2 tab qhs. Continue prn bisacodyl and monitor. Encouraged hydration  Leg edema Continue lasix as needed and monitor. Continue kcl supplement  gerd Stable, continue protonix 40 mg daily    Goals of care: short term rehabilitation   Labs/tests ordered: cbc, bmp 04/12/15   Family/ staff Communication: reviewed care plan with patient and nursing supervisor    Oneal Grout, MD Internal Medicine [facility] [address] [place], [place] [address] Cell Phone (Monday-Friday 8 am - 5 pm): [phone] On Call: [phone] and follow prompts after 5 pm and on weekends Office Phone: [phone] Office Fax: [phone]

## 2015-04-12 ENCOUNTER — Non-Acute Institutional Stay (SKILLED_NURSING_FACILITY): Payer: Medicare Other | Admitting: Adult Health

## 2015-04-12 ENCOUNTER — Encounter: Payer: Self-pay | Admitting: Adult Health

## 2015-04-12 DIAGNOSIS — M25561 Pain in right knee: Secondary | ICD-10-CM | POA: Diagnosis not present

## 2015-04-12 LAB — BASIC METABOLIC PANEL
BUN: 10 mg/dL (ref 4–21)
Creatinine: 0.6 mg/dL (ref 0.5–1.1)
GLUCOSE: 113 mg/dL
Potassium: 4.1 mmol/L (ref 3.4–5.3)
SODIUM: 139 mmol/L (ref 137–147)

## 2015-04-12 LAB — CBC AND DIFFERENTIAL
HEMATOCRIT: 36 % (ref 36–46)
Hemoglobin: 11.2 g/dL — AB (ref 12.0–16.0)
Platelets: 335 10*3/uL (ref 150–399)
WBC: 9.9 10^3/mL

## 2015-04-12 NOTE — Progress Notes (Signed)
Patient ID: [name], female   DOB: 01-Oct-1940, 75 y.o.   MRN: [id number]    DATE:  04/12/15  MRN:  [id number]  BIRTHDAY: 12-Nov-1940  Facility:  Nursing Home Location:  [facility]  Nursing Home Room Number: 1008-P  LEVEL OF CARE:  SNF [phone])  Contact Information    Name Relation Home Work [place] Daughter [phone]  [phone]       Code Status History    Date Active Date Inactive Code Status Order ID Comments User Context   03/16/2014  4:33 AM 03/16/2014 10:08 PM Full Code [id number]  Otis Brace, MD Inpatient       Chief Complaint  Patient presents with  . Acute Visit    Pain management    HISTORY OF PRESENT ILLNESS:  This is a 75 year old female who complains of right knee pain. She had a recent right total knee arthroplasty. She is @ [facility] for a short-term rehabilitation.  PAST MEDICAL HISTORY:  Past Medical History  Diagnosis Date  . Essential hypertension   . Osteoarthritis     knees and hands  . Systolic murmur     per patient report she has had rheumatic fever in childhood  . Osteoporosis, post-menopausal 2010    left femur T -2.7, left forearm T -3.5  . Blood transfusion without reported diagnosis     1960's, unknown reason  . Post-menopausal atrophic vaginitis   . Obesity, Class I, BMI 30-34.9   . Anemia 03/16/2014  . Cricopharyngeal achalasia 04/08/2014    Rigid esophagoscopy, dilation, and Botox injection into cricopharyngeus muscle recommended   . Chronic rheumatic heart disease 05/05/2014  . Mitral valve regurgitation, rheumatic 05/05/2014    Moderate to severe with peak PA pressure of 56 mmHg on Echo 04/2014   . Atrial flutter (HCC) 05/07/2014    With controlled ventricular rate on no AV nodal agents   . PONV (postoperative nausea and vomiting)     with recent dilatation of throat, had N/V     CURRENT MEDICATIONS: Reviewed  Patient's Medications  New Prescriptions   No medications on file   Previous Medications   AMLODIPINE (NORVASC) 10 MG TABLET    Take 1 tablet (10 mg total) by mouth daily.   APIXABAN (ELIQUIS) 5 MG TABS TABLET    Take 1 tablet (5 mg total) by mouth 2 (two) times daily.   BISACODYL (BISACODYL) 5 MG EC TABLET    Take 10 mg by mouth daily as needed (constipation).    CALCIUM CARBONATE (TUMS - DOSED IN MG ELEMENTAL CALCIUM) 500 MG CHEWABLE TABLET    Chew 2 tablets by mouth 2 (two) times daily as needed for indigestion or heartburn.   CLOBETASOL OINTMENT (TEMOVATE) 0.05 %    Apply 1 application topically daily as needed (for skin irritation).    FUROSEMIDE (LASIX) 20 MG TABLET    TAKE 1 TABLET(20 MG) BY MOUTH DAILY AS NEEDED FOR FLUID RETENTION   METHOCARBAMOL (ROBAXIN) 500 MG TABLET    Take 1 tablet (500 mg total) by mouth every 6 (six) hours as needed for muscle spasms.   OXYCODONE (OXYCONTIN) 10 MG 12 HR TABLET    Take 10 mg by mouth every 12 (twelve) hours.   OXYCODONE-ACETAMINOPHEN (PERCOCET/ROXICET) 5-325 MG TABLET    Take 1-2 tablets by mouth every 4 (four) hours as needed for severe pain.   PANTOPRAZOLE (PROTONIX) 40 MG TABLET    TAKE 1 TABLET(40 MG) BY MOUTH DAILY  POLYETHYLENE GLYCOL (MIRALAX / GLYCOLAX) PACKET    Take 17 g by mouth daily as needed.   POTASSIUM CHLORIDE (K-DUR) 10 MEQ TABLET    Take 10 mEq by mouth every 12 (twelve) hours as needed (When lasix is given prn for fluid retention).   SENNOSIDES-DOCUSATE SODIUM (SENOKOT-S) 8.6-50 MG TABLET    Take 2 tablets by mouth at bedtime.   TRAMADOL (ULTRAM) 50 MG TABLET    Take 100 mg by mouth every 6 (six) hours as needed for severe pain (Take 2 tablets = 100mg ).   Modified Medications   No medications on file     CALCIUM CARBONATE (TUMS) 500 MG CHEWABLE TABLET    Chew 1 tablet (200 mg of elemental calcium total) by mouth daily.   DOCUSATE SODIUM (COLACE) 100 MG CAPSULE    Take 100 mg by mouth 2 (two) times daily as needed.       HYDROCODONE-ACETAMINOPHEN (NORCO/VICODIN) 5-325 MG TABLET    Take 1  tablet by mouth every 6 (six) hours as needed for moderate pain. Give at 6AM, 2PM, 6PM, 10PM.  Also may take 1 tab q4h PRN.   POTASSIUM CHLORIDE (K-DUR) 10 MEQ TABLET    Take 10 mEq by mouth 2 (two) times daily. Stop date 04/15/15    Allergies  Allergen Reactions  . Ace Inhibitors Cough     REVIEW OF SYSTEMS:  GENERAL: no change in appetite, no fatigue, no weight changes, no fever, chills or weakness SKIN: Denies rash, itching, wounds, ulcer sores, or nail abnormality EYES: Denies change in vision, dry eyes, eye pain, itching or discharge EARS: Denies change in hearing, ringing in ears, or earache NOSE: Denies nasal congestion or epistaxis MOUTH and THROAT: Denies oral discomfort, gingival pain or bleeding, pain from teeth or hoarseness   RESPIRATORY: no cough, SOB, DOE, wheezing, hemoptysis CARDIAC: no chest pain, edema or palpitations GI: no abdominal pain, diarrhea, constipation, heart burn, nausea or vomiting GU: Denies dysuria, frequency, hematuria, incontinence, or discharge MUSCULOSKELETAL: Denies joit pain, muscle pain, back pain, restricted movement, or unusual weakness CIRCULATION: Denies claudication, edema of legs, varicosities, or cold extremities NEUROLOGICAL: Denies dizziness, syncope, numbness, or headache PSYCHIATRIC: Denies feeling of depression or anxiety. No report of hallucinations, insomnia, paranoia, or agitation ENDOCRINE: Denies polyphagia, polyuria, polydipsia, heat or cold intolerance HEME/LYMPH: Denies excessive bruising, petechia, enlarged lymph nodes, or bleeding problems IMMUNOLOGIC: Denies history of frequent infections, AIDS, or use of immunosuppressive agents    PHYSICAL EXAMINATION  GENERAL APPEARANCE: Well nourished. In no acute distress. Normal body habitus SKIN:  Right knee surgical incision is covered with aquacel, no erythema HEAD: Normal in size and contour. No evidence of trauma EYES: Lids open and close normally. No blepharitis, entropion  or ectropion. PERRL. Conjunctivae are clear and sclerae are white. Lenses are without opacity EARS: Pinnae are normal. Patient hears normal voice tunes of the examiner MOUTH and THROAT: Lips are without lesions. Oral mucosa is moist and without lesions. Tongue is normal in shape, size, and color and without lesions NECK: supple, trachea midline, no neck masses, no thyroid tenderness, no thyromegaly LYMPHATICS: no LAN in the neck, no supraclavicular LAN RESPIRATORY: breathing is even & unlabored, BS CTAB CARDIAC: RRR, no murmur,no extra heart sounds, RLE edema 1+ GI: abdomen soft, normal BS, no masses, no tenderness, no hepatomegaly, no splenomegaly EXTREMITIES:  Able to move X 4 extremities PSYCHIATRIC: Alert and oriented X 3. Affect and behavior are appropriate  LABS/RADIOLOGY: Labs reviewed: Basic Metabolic Panel:  Recent Labs  02/16/15 1513 03/08/15 1448 03/26/15 03/26/15 1550 04/12/15  NA 138 137 138 138 139  K 3.3* 3.4*  --  3.5 4.1  CL 103 103  --  104  --   CO2 28 25  --  22  --   GLUCOSE 102* 114*  --  105*  --   BUN CREATININE 0.77 0.98* 0.7 0.71 0.6  CALCIUM 9.2 9.1  --  9.4  --    Liver Function Tests:  Recent Labs  02/16/15 1513  AST 19  ALT 14  ALKPHOS 91  BILITOT 0.5  PROT 6.9  ALBUMIN 3.7   CBC:  Recent Labs  05/05/14 1247 02/16/15 1513 03/26/15 1550 04/06/15 04/06/15 1142 04/12/15  WBC 10.5 8.5 9.3 7.1 7.1 9.9  NEUTROABS 6.7  --   --   --  5.0  --   HGB 10.8* 12.9 14.0  --  12.3 11.2*  HCT 33.7* 39.3 43.4  --  38.2 36  MCV 76.1* 81.9 85.9  --  85.7  --   PLT 390.0 285 244  --  205 335   Lipid Panel:  Recent Labs  02/16/15 1513  HDL 47     Dg Chest 2 View  04/06/2015  CLINICAL DATA:  Preop for right knee arthroplasty. EXAM: CHEST - 2 VIEW COMPARISON:  Two-view chest x-ray 03/16/2014. FINDINGS: The heart size is upper limits of normal. There is mild prominence of the aorta fluid there is no edema or effusion to suggest  failure. No focal airspace disease present. The axial skeleton is unremarkable. IMPRESSION: 1. Stable borderline cardiomegaly without failure. 2. Stable mild prominence of the aorta. 3. No acute abnormality. Electronically Signed   By: Marin Roberts M.D.   On: 04/06/2015 11:57    ASSESSMENT/PLAN:  Right knee pain - start Norco 5/325 mg 1 tab PO Q 6AM, 2PM, 6PM and 10PM, continue Norco 5/325 mg 1 tab PO Q 4 hours PRN    MEDINA-VARGAS,MONINA, NP [employer] [phone]

## 2015-04-15 ENCOUNTER — Encounter: Payer: Self-pay | Admitting: Adult Health

## 2015-04-15 ENCOUNTER — Non-Acute Institutional Stay (SKILLED_NURSING_FACILITY): Payer: Medicare Other | Admitting: Adult Health

## 2015-04-15 DIAGNOSIS — M25561 Pain in right knee: Secondary | ICD-10-CM | POA: Diagnosis not present

## 2015-04-15 NOTE — Progress Notes (Signed)
Patient ID: [name], female   DOB: 1940-06-30, 75 y.o.   MRN: [id number]    DATE:  04/15/15  MRN:  [id number]  BIRTHDAY: 1940/09/06  Facility:  Nursing Home Location:  [facility]  Nursing Home Room Number: 1008-P  LEVEL OF CARE:  SNF [phone])  Contact Information    Name Relation Home Work [place] Daughter [phone]  [phone]       Code Status History    Date Active Date Inactive Code Status Order ID Comments User Context   03/16/2014  4:33 AM 03/16/2014 10:08 PM Full Code [id number]  Otis Brace, MD Inpatient       Chief Complaint  Patient presents with  . Acute Visit    Pain management    HISTORY OF PRESENT ILLNESS:  This is a 75 year old female who complains of right knee pain. Her pain medication was recently adjusted but patient is still in pain. She had a recent right total knee arthroplasty. She is @ [facility] for a short-term rehabilitation.  PAST MEDICAL HISTORY:  Past Medical History  Diagnosis Date  . Essential hypertension   . Osteoarthritis     knees and hands  . Systolic murmur     per patient report she has had rheumatic fever in childhood  . Osteoporosis, post-menopausal 2010    left femur T -2.7, left forearm T -3.5  . Blood transfusion without reported diagnosis     1960's, unknown reason  . Post-menopausal atrophic vaginitis   . Obesity, Class I, BMI 30-34.9   . Anemia 03/16/2014  . Cricopharyngeal achalasia 04/08/2014    Rigid esophagoscopy, dilation, and Botox injection into cricopharyngeus muscle recommended   . Chronic rheumatic heart disease 05/05/2014  . Mitral valve regurgitation, rheumatic 05/05/2014    Moderate to severe with peak PA pressure of 56 mmHg on Echo 04/2014   . Atrial flutter (HCC) 05/07/2014    With controlled ventricular rate on no AV nodal agents   . PONV (postoperative nausea and vomiting)     with recent dilatation of throat, had N/V     CURRENT MEDICATIONS: Reviewed     Medication List       This list is accurate as of: 04/15/15  3:32 PM.  Always use your most recent med list.               amLODipine 10 MG tablet  Commonly known as:  NORVASC  Take 1 tablet (10 mg total) by mouth daily.     apixaban 5 MG Tabs tablet  Commonly known as:  ELIQUIS  Take 1 tablet (5 mg total) by mouth 2 (two) times daily.     bisacodyl 5 MG EC tablet  Generic drug:  bisacodyl  Take 10 mg by mouth daily as needed (constipation).     calcium carbonate 500 MG chewable tablet  Commonly known as:  TUMS - dosed in mg elemental calcium  Chew 2 tablets by mouth 2 (two) times daily as needed for indigestion or heartburn.     clobetasol ointment 0.05 %  Commonly known as:  TEMOVATE  Apply 1 application topically daily as needed (for skin irritation).     furosemide 20 MG tablet  Commonly known as:  LASIX  TAKE 1 TABLET(20 MG) BY MOUTH DAILY AS NEEDED FOR FLUID RETENTION     methocarbamol 500 MG tablet  Commonly known as:  ROBAXIN  Take 1 tablet (500 mg total) by mouth every 6 (six) hours  as needed for muscle spasms.     oxyCODONE 10 mg 12 hr tablet  Commonly known as:  OXYCONTIN  Take 10 mg by mouth every 12 (twelve) hours.     oxyCODONE-acetaminophen 5-325 MG tablet  Commonly known as:  PERCOCET/ROXICET  Take 1-2 tablets by mouth every 4 (four) hours as needed for severe pain.     pantoprazole 40 MG tablet  Commonly known as:  PROTONIX  TAKE 1 TABLET(40 MG) BY MOUTH DAILY     polyethylene glycol packet  Commonly known as:  MIRALAX / GLYCOLAX  Take 17 g by mouth daily as needed.     potassium chloride 10 MEQ tablet  Commonly known as:  K-DUR  Take 10 mEq by mouth every 12 (twelve) hours as needed (When lasix is given prn for fluid retention).     sennosides-docusate sodium 8.6-50 MG tablet  Commonly known as:  SENOKOT-S  Take 2 tablets by mouth at bedtime.     traMADol 50 MG tablet  Commonly known as:  ULTRAM  Take 100 mg by mouth every 6 (six)  hours as needed for severe pain (Take 2 tablets = ).         Allergies  Allergen Reactions  . Ace Inhibitors Cough     REVIEW OF SYSTEMS:  GENERAL: no change in appetite, no fatigue, no weight changes, no fever, chills or weakness SKIN: Denies rash, itching, wounds, ulcer sores, or nail abnormality EYES: Denies change in vision, dry eyes, eye pain, itching or discharge EARS: Denies change in hearing, ringing in ears, or earache NOSE: Denies nasal congestion or epistaxis MOUTH and THROAT: Denies oral discomfort, gingival pain or bleeding, pain from teeth or hoarseness   RESPIRATORY: no cough, SOB, DOE, wheezing, hemoptysis CARDIAC: no chest pain, edema or palpitations GI: no abdominal pain, diarrhea, constipation, heart burn, nausea or vomiting GU: Denies dysuria, frequency, hematuria, incontinence, or discharge PSYCHIATRIC: Denies feeling of depression or anxiety. No report of hallucinations, insomnia, paranoia, or agitation   PHYSICAL EXAMINATION  GENERAL APPEARANCE: Well nourished. In no acute distress. Normal body habitus SKIN:  Right knee surgical incision is covered with aquacel, no erythema HEAD: Normal in size and contour. No evidence of trauma EYES: Lids open and close normally. No blepharitis, entropion or ectropion. PERRL. Conjunctivae are clear and sclerae are white. Lenses are without opacity EARS: Pinnae are normal. Patient hears normal voice tunes of the examiner MOUTH and THROAT: Lips are without lesions. Oral mucosa is moist and without lesions. Tongue is normal in shape, size, and color and without lesions NECK: supple, trachea midline, no neck masses, no thyroid tenderness, no thyromegaly LYMPHATICS: no LAN in the neck, no supraclavicular LAN RESPIRATORY: breathing is even & unlabored, BS CTAB CARDIAC: RRR, no murmur,no extra heart sounds, RLE edema 1+ GI: abdomen soft, normal BS, no masses, no tenderness, no hepatomegaly, no splenomegaly EXTREMITIES:   Able to move X 4 extremities PSYCHIATRIC: Alert and oriented X 3. Affect and behavior are appropriate  LABS/RADIOLOGY: Labs reviewed: Basic Metabolic Panel:  Recent Labs  16/10/96 1513 03/08/15 1448 03/26/15 03/26/15 1550 04/12/15  NA 138 137 138 138 139  K 3.3* 3.4*  --  3.5 4.1  CL 103 103  --  104  --   CO2 28 25  --  22  --   GLUCOSE 102* 114*  --  105*  --   BUN CREATININE 0.77 0.98* 0.7 0.71 0.6  CALCIUM 9.2 9.1  --  9.4  --    Liver Function Tests:  Recent Labs  02/16/15 1513  AST 19  ALT 14  ALKPHOS 91  BILITOT 0.5  PROT 6.9  ALBUMIN 3.7   CBC:  Recent Labs  05/05/14 1247 02/16/15 1513 03/26/15 1550 04/06/15 04/06/15 1142 04/12/15  WBC 10.5 8.5 9.3 7.1 7.1 9.9  NEUTROABS 6.7  --   --   --  5.0  --   HGB 10.8* 12.9 14.0  --  12.3 11.2*  HCT 33.7* 39.3 43.4  --  38.2 36  MCV 76.1* 81.9 85.9  --  85.7  --   PLT 390.0 285 244  --  205 335   Lipid Panel:  Recent Labs  02/16/15 1513  HDL 47     Dg Chest 2 View  04/06/2015  CLINICAL DATA:  Preop for right knee arthroplasty. EXAM: CHEST - 2 VIEW COMPARISON:  Two-view chest x-ray 03/16/2014. FINDINGS: The heart size is upper limits of normal. There is mild prominence of the aorta fluid there is no edema or effusion to suggest failure. No focal airspace disease present. The axial skeleton is unremarkable. IMPRESSION: 1. Stable borderline cardiomegaly without failure. 2. Stable mild prominence of the aorta. 3. No acute abnormality. Electronically Signed   By: Marin Robertshristopher  Mattern M.D.   On: 04/06/2015 11:57    ASSESSMENT/PLAN:  Right knee pain - discontinue Norco; start Oxycontin 10 mg ER 1 tab PO Q 12 hours and Percocet 5/325 mg 1-2 tabs PO Q 4 hours PRN; discontinue Norco PRN; apply ice pack X 20 mins Q shift X 1 week    MEDINA-VARGAS,Abigail Siever, NP [facility] [phone]

## 2015-04-16 ENCOUNTER — Other Ambulatory Visit: Payer: Self-pay

## 2015-04-16 ENCOUNTER — Ambulatory Visit (HOSPITAL_COMMUNITY): Payer: Medicare Other | Attending: Cardiovascular Disease

## 2015-04-16 DIAGNOSIS — I350 Nonrheumatic aortic (valve) stenosis: Secondary | ICD-10-CM | POA: Insufficient documentation

## 2015-04-16 DIAGNOSIS — I4819 Other persistent atrial fibrillation: Secondary | ICD-10-CM

## 2015-04-16 DIAGNOSIS — I340 Nonrheumatic mitral (valve) insufficiency: Secondary | ICD-10-CM | POA: Insufficient documentation

## 2015-04-16 DIAGNOSIS — I051 Rheumatic mitral insufficiency: Secondary | ICD-10-CM | POA: Diagnosis not present

## 2015-04-16 DIAGNOSIS — I119 Hypertensive heart disease without heart failure: Secondary | ICD-10-CM | POA: Insufficient documentation

## 2015-04-16 DIAGNOSIS — I481 Persistent atrial fibrillation: Secondary | ICD-10-CM | POA: Diagnosis not present

## 2015-04-16 DIAGNOSIS — E785 Hyperlipidemia, unspecified: Secondary | ICD-10-CM | POA: Diagnosis not present

## 2015-04-19 DIAGNOSIS — M1711 Unilateral primary osteoarthritis, right knee: Secondary | ICD-10-CM | POA: Diagnosis not present

## 2015-04-22 ENCOUNTER — Encounter: Payer: Self-pay | Admitting: Adult Health

## 2015-04-22 ENCOUNTER — Non-Acute Institutional Stay (SKILLED_NURSING_FACILITY): Payer: Medicare Other | Admitting: Adult Health

## 2015-04-22 DIAGNOSIS — K219 Gastro-esophageal reflux disease without esophagitis: Secondary | ICD-10-CM

## 2015-04-22 DIAGNOSIS — M1711 Unilateral primary osteoarthritis, right knee: Secondary | ICD-10-CM

## 2015-04-22 DIAGNOSIS — I480 Paroxysmal atrial fibrillation: Secondary | ICD-10-CM | POA: Diagnosis not present

## 2015-04-22 DIAGNOSIS — I10 Essential (primary) hypertension: Secondary | ICD-10-CM | POA: Diagnosis not present

## 2015-04-22 DIAGNOSIS — R600 Localized edema: Secondary | ICD-10-CM | POA: Diagnosis not present

## 2015-04-22 DIAGNOSIS — K5900 Constipation, unspecified: Secondary | ICD-10-CM | POA: Diagnosis not present

## 2015-04-22 NOTE — Progress Notes (Signed)
Patient ID: [name], female   DOB: 1940/04/12, 75 y.o.   MRN: [id number]    DATE:  04/22/15  MRN:  [id number]  BIRTHDAY: April 20, 1940  Facility:  Nursing Home Location:  [facility]  Nursing Home Room Number: 1008-P  LEVEL OF CARE:  SNF [phone])      Contact Information    Name Relation Home Work [place] Daughter [phone]  [phone]       Code Status History    Date Active Date Inactive Code Status Order ID Comments User Context   03/16/2014  4:33 AM 03/16/2014 10:08 PM Full Code [id number]  Otis Brace, MD Inpatient       Chief Complaint  Patient presents with  . Discharge Note    HISTORY OF PRESENT ILLNESS:  This is a 75 year old female who is for discharge home with Home health OT for ADLs, PT for endurance, CNA for showers and Child psychotherapist for community resources. DME:  Rolling walker and 3-in-1 commode. She has been admitted to [facility] on 04/08/15 from [facility]. She has PMH of hypertension, mitral valve regurgitation, rheumatic, osteoporosis and atrial flutter. She has osteoarthritis of right knee for which she had right total knee arthroplasty.  Patient was admitted to this facility for short-term rehabilitation after the patient's recent hospitalization.  Patient has completed SNF rehabilitation and therapy has cleared the patient for discharge.  PAST MEDICAL HISTORY:  Past Medical History  Diagnosis Date  . Essential hypertension   . Osteoarthritis     knees and hands  . Systolic murmur     per patient report she has had rheumatic fever in childhood  . Osteoporosis, post-menopausal 2010    left femur T -2.7, left forearm T -3.5  . Blood transfusion without reported diagnosis     1960's, unknown reason  . Post-menopausal atrophic vaginitis   . Obesity, Class I, BMI 30-34.9   . Anemia 03/16/2014  . Cricopharyngeal achalasia 04/08/2014    Rigid esophagoscopy, dilation, and Botox injection into  cricopharyngeus muscle recommended   . Chronic rheumatic heart disease 05/05/2014  . Mitral valve regurgitation, rheumatic 05/05/2014    Moderate to severe with peak PA pressure of 56 mmHg on Echo 04/2014   . Atrial flutter (HCC) 05/07/2014    With controlled ventricular rate on no AV nodal agents   . PONV (postoperative nausea and vomiting)     with recent dilatation of throat, had N/V     CURRENT MEDICATIONS: Reviewed    Medication List       This list is accurate as of: 04/22/15  4:18 PM.  Always use your most recent med list.               amLODipine 10 MG tablet  Commonly known as:  NORVASC  Take 1 tablet (10 mg total) by mouth daily.     apixaban 5 MG Tabs tablet  Commonly known as:  ELIQUIS  Take 1 tablet (5 mg total) by mouth 2 (two) times daily.     bisacodyl 5 MG EC tablet  Generic drug:  bisacodyl  Take 10 mg by mouth daily as needed (constipation).     calcium carbonate 500 MG chewable tablet  Commonly known as:  TUMS - dosed in mg elemental calcium  Chew 2 tablets by mouth 2 (two) times daily as needed for indigestion or heartburn.     clobetasol ointment 0.05 %  Commonly known as:  TEMOVATE  Apply  1 application topically daily as needed (for skin irritation).     furosemide 20 MG tablet  Commonly known as:  LASIX  TAKE 1 TABLET(20 MG) BY MOUTH DAILY AS NEEDED FOR FLUID RETENTION     methocarbamol 500 MG tablet  Commonly known as:  ROBAXIN  Take 1 tablet (500 mg total) by mouth every 6 (six) hours as needed for muscle spasms.     oxyCODONE 10 mg 12 hr tablet  Commonly known as:  OXYCONTIN  Take 10 mg by mouth every 12 (twelve) hours.     oxyCODONE-acetaminophen 5-325 MG tablet  Commonly known as:  PERCOCET/ROXICET  Take 1-2 tablets by mouth every 4 (four) hours as needed for severe pain.     pantoprazole 40 MG tablet  Commonly known as:  PROTONIX  TAKE 1 TABLET(40 MG) BY MOUTH DAILY     polyethylene glycol packet  Commonly known as:  MIRALAX /  GLYCOLAX  Take 17 g by mouth daily as needed.     potassium chloride 10 MEQ tablet  Commonly known as:  K-DUR  Take 10 mEq by mouth every 12 (twelve) hours as needed (When lasix is given prn for fluid retention).     sennosides-docusate sodium 8.6-50 MG tablet  Commonly known as:  SENOKOT-S  Take 2 tablets by mouth at bedtime.     traMADol 50 MG tablet  Commonly known as:  ULTRAM  Take 50 mg by mouth every 6 (six) hours as needed for severe pain (Take 1 - 2 tabs Q 6 hours PRN).         Allergies  Allergen Reactions  . Ace Inhibitors Cough     REVIEW OF SYSTEMS:  GENERAL: no change in appetite, no fatigue, no weight changes, no fever, chills or weakness SKIN: Denies rash, itching, wounds, ulcer sores, or nail abnormality EYES: Denies change in vision, dry eyes, eye pain, itching or discharge EARS: Denies change in hearing, ringing in ears, or earache NOSE: Denies nasal congestion or epistaxis MOUTH and THROAT: Denies oral discomfort, gingival pain or bleeding, pain from teeth or hoarseness   RESPIRATORY: no cough, SOB, DOE, wheezing, hemoptysis CARDIAC: no chest pain, edema or palpitations GI: no abdominal pain, diarrhea, constipation, heart burn, nausea or vomiting GU: Denies dysuria, frequency, hematuria, incontinence, or discharge PSYCHIATRIC: Denies feeling of depression or anxiety. No report of hallucinations, insomnia, paranoia, or agitation   PHYSICAL EXAMINATION  GENERAL APPEARANCE: Well nourished. In no acute distress. Normal body habitus SKIN:  Right knee surgical incision is dry, no erythema HEAD: Normal in size and contour. No evidence of trauma EYES: Lids open and close normally. No blepharitis, entropion or ectropion. PERRL. Conjunctivae are clear and sclerae are white. Lenses are without opacity EARS: Pinnae are normal. Patient hears normal voice tunes of the examiner MOUTH and THROAT: Lips are without lesions. Oral mucosa is moist and without lesions.  Tongue is normal in shape, size, and color and without lesions NECK: supple, trachea midline, no neck masses, no thyroid tenderness, no thyromegaly LYMPHATICS: no LAN in the neck, no supraclavicular LAN RESPIRATORY: breathing is even & unlabored, BS CTAB CARDIAC: RRR, no murmur,no extra heart sounds, RLE edema 1+ GI: abdomen soft, normal BS, no masses, no tenderness, no hepatomegaly, no splenomegaly EXTREMITIES:  Able to move X 4 extremities PSYCHIATRIC: Alert and oriented X 3. Affect and behavior are appropriate  LABS/RADIOLOGY: Labs reviewed: Basic Metabolic Panel:  Recent Labs  16/10/9600/14/17 1513 03/08/15 1448 03/26/15 03/26/15 1550 04/12/15  NA 138 137  138 138 139  K 3.3* 3.4*  --  3.5 4.1  CL 103 103  --  104  --   CO2 28 25  --  22  --   GLUCOSE 102* 114*  --  105*  --   BUN CREATININE 0.77 0.98* 0.7 0.71 0.6  CALCIUM 9.2 9.1  --  9.4  --    Liver Function Tests:  Recent Labs  02/16/15 1513  AST 19  ALT 14  ALKPHOS 91  BILITOT 0.5  PROT 6.9  ALBUMIN 3.7   CBC:  Recent Labs  05/05/14 1247 02/16/15 1513 03/26/15 1550 04/06/15 04/06/15 1142 04/12/15  WBC 10.5 8.5 9.3 7.1 7.1 9.9  NEUTROABS 6.7  --   --   --  5.0  --   HGB 10.8* 12.9 14.0  --  12.3 11.2*  HCT 33.7* 39.3 43.4  --  38.2 36  MCV 76.1* 81.9 85.9  --  85.7  --   PLT 390.0 285 244  --  205 335   Lipid Panel:  Recent Labs  02/16/15 1513  HDL 47     Dg Chest 2 View  04/06/2015  CLINICAL DATA:  Preop for right knee arthroplasty. EXAM: CHEST - 2 VIEW COMPARISON:  Two-view chest x-ray 03/16/2014. FINDINGS: The heart size is upper limits of normal. There is mild prominence of the aorta fluid there is no edema or effusion to suggest failure. No focal airspace disease present. The axial skeleton is unremarkable. IMPRESSION: 1. Stable borderline cardiomegaly without failure. 2. Stable mild prominence of the aorta. 3. No acute abnormality. Electronically Signed   By: Marin Roberts M.D.    On: 04/06/2015 11:57    ASSESSMENT/PLAN:  Right knee OA  S/P right total knee arthroplasty - for Home health OT for ADLs, PT for endurance, CNA for showers and social worker for community resources; continue Oxycontin 10 mg ER 1 tab Q 12 hours, Percocet 5/325 mg 1-2 tabs PO Q 4 hours and tramadol 50 mg 1-2 tabs PO Q 6 hours PRN for pain;  Eliquis 5 mg 1 tab BID for DVT prophylaxis; Robaxin 500 mg Q 6 hours PRN for muscle spasm; RLE WBAT; follow-up with orthopedics, Dr. Jerl Santos  Hypertension - well-controlled;  Continue Norvasc 10 mg Has elevated BP reading, check bp bid x 1 week. Continue amlodipine 10 mg daily  Leg edema - continue Lasix 20 mg daily as needed  kcl 20 meq 1 tab  Daily PRN when Lasix is given  GERD - stable; continue Protonix 40 mg daily  Constipation - continue Miralax 17 gm daily, Senn-S 2 tabs Q HS and Bisacodyl EC 5 mg daily PRN  PAF - rate-controlled; continue Eliquis 5 mg 1 tab BID      I have filled out patient's discharge paperwork and written prescriptions.  Patient will receive home health PT, OT, Social Worker and CNA.  DME provided:  Rolling walker and 3-in-1 commode  Total discharge time: Greater than 30 minutes  Discharge time involved coordination of the discharge process with Child psychotherapist, nursing staff and therapy department. Medical justification for home health services/DME verified.    [facility], NP [facility] [phone]

## 2015-04-25 DIAGNOSIS — Z7901 Long term (current) use of anticoagulants: Secondary | ICD-10-CM | POA: Diagnosis not present

## 2015-04-25 DIAGNOSIS — I4892 Unspecified atrial flutter: Secondary | ICD-10-CM | POA: Diagnosis not present

## 2015-04-25 DIAGNOSIS — Z96651 Presence of right artificial knee joint: Secondary | ICD-10-CM | POA: Diagnosis not present

## 2015-04-25 DIAGNOSIS — Z471 Aftercare following joint replacement surgery: Secondary | ICD-10-CM | POA: Diagnosis not present

## 2015-04-25 DIAGNOSIS — M19041 Primary osteoarthritis, right hand: Secondary | ICD-10-CM | POA: Diagnosis not present

## 2015-04-25 DIAGNOSIS — M1712 Unilateral primary osteoarthritis, left knee: Secondary | ICD-10-CM | POA: Diagnosis not present

## 2015-04-25 DIAGNOSIS — I051 Rheumatic mitral insufficiency: Secondary | ICD-10-CM | POA: Diagnosis not present

## 2015-04-25 DIAGNOSIS — K5909 Other constipation: Secondary | ICD-10-CM | POA: Diagnosis not present

## 2015-04-25 DIAGNOSIS — Z79891 Long term (current) use of opiate analgesic: Secondary | ICD-10-CM | POA: Diagnosis not present

## 2015-04-25 DIAGNOSIS — M810 Age-related osteoporosis without current pathological fracture: Secondary | ICD-10-CM | POA: Diagnosis not present

## 2015-04-25 DIAGNOSIS — I10 Essential (primary) hypertension: Secondary | ICD-10-CM | POA: Diagnosis not present

## 2015-04-25 DIAGNOSIS — M19042 Primary osteoarthritis, left hand: Secondary | ICD-10-CM | POA: Diagnosis not present

## 2015-04-27 DIAGNOSIS — K5909 Other constipation: Secondary | ICD-10-CM | POA: Diagnosis not present

## 2015-04-27 DIAGNOSIS — M19041 Primary osteoarthritis, right hand: Secondary | ICD-10-CM | POA: Diagnosis not present

## 2015-04-27 DIAGNOSIS — I4892 Unspecified atrial flutter: Secondary | ICD-10-CM | POA: Diagnosis not present

## 2015-04-27 DIAGNOSIS — Z7901 Long term (current) use of anticoagulants: Secondary | ICD-10-CM | POA: Diagnosis not present

## 2015-04-27 DIAGNOSIS — Z79891 Long term (current) use of opiate analgesic: Secondary | ICD-10-CM | POA: Diagnosis not present

## 2015-04-27 DIAGNOSIS — M1712 Unilateral primary osteoarthritis, left knee: Secondary | ICD-10-CM | POA: Diagnosis not present

## 2015-04-27 DIAGNOSIS — M19042 Primary osteoarthritis, left hand: Secondary | ICD-10-CM | POA: Diagnosis not present

## 2015-04-27 DIAGNOSIS — Z471 Aftercare following joint replacement surgery: Secondary | ICD-10-CM | POA: Diagnosis not present

## 2015-04-27 DIAGNOSIS — M810 Age-related osteoporosis without current pathological fracture: Secondary | ICD-10-CM | POA: Diagnosis not present

## 2015-04-27 DIAGNOSIS — I10 Essential (primary) hypertension: Secondary | ICD-10-CM | POA: Diagnosis not present

## 2015-04-27 DIAGNOSIS — Z96651 Presence of right artificial knee joint: Secondary | ICD-10-CM | POA: Diagnosis not present

## 2015-04-27 DIAGNOSIS — I051 Rheumatic mitral insufficiency: Secondary | ICD-10-CM | POA: Diagnosis not present

## 2015-04-29 DIAGNOSIS — I4892 Unspecified atrial flutter: Secondary | ICD-10-CM | POA: Diagnosis not present

## 2015-04-29 DIAGNOSIS — Z96651 Presence of right artificial knee joint: Secondary | ICD-10-CM | POA: Diagnosis not present

## 2015-04-29 DIAGNOSIS — Z7901 Long term (current) use of anticoagulants: Secondary | ICD-10-CM | POA: Diagnosis not present

## 2015-04-29 DIAGNOSIS — M810 Age-related osteoporosis without current pathological fracture: Secondary | ICD-10-CM | POA: Diagnosis not present

## 2015-04-29 DIAGNOSIS — Z471 Aftercare following joint replacement surgery: Secondary | ICD-10-CM | POA: Diagnosis not present

## 2015-04-29 DIAGNOSIS — M1712 Unilateral primary osteoarthritis, left knee: Secondary | ICD-10-CM | POA: Diagnosis not present

## 2015-04-29 DIAGNOSIS — Z79891 Long term (current) use of opiate analgesic: Secondary | ICD-10-CM | POA: Diagnosis not present

## 2015-04-29 DIAGNOSIS — M19042 Primary osteoarthritis, left hand: Secondary | ICD-10-CM | POA: Diagnosis not present

## 2015-04-29 DIAGNOSIS — K5909 Other constipation: Secondary | ICD-10-CM | POA: Diagnosis not present

## 2015-04-29 DIAGNOSIS — I10 Essential (primary) hypertension: Secondary | ICD-10-CM | POA: Diagnosis not present

## 2015-04-29 DIAGNOSIS — M19041 Primary osteoarthritis, right hand: Secondary | ICD-10-CM | POA: Diagnosis not present

## 2015-04-29 DIAGNOSIS — I051 Rheumatic mitral insufficiency: Secondary | ICD-10-CM | POA: Diagnosis not present

## 2015-05-03 DIAGNOSIS — M1712 Unilateral primary osteoarthritis, left knee: Secondary | ICD-10-CM | POA: Diagnosis not present

## 2015-05-03 DIAGNOSIS — Z96651 Presence of right artificial knee joint: Secondary | ICD-10-CM | POA: Diagnosis not present

## 2015-05-03 DIAGNOSIS — M810 Age-related osteoporosis without current pathological fracture: Secondary | ICD-10-CM | POA: Diagnosis not present

## 2015-05-03 DIAGNOSIS — Z79891 Long term (current) use of opiate analgesic: Secondary | ICD-10-CM | POA: Diagnosis not present

## 2015-05-03 DIAGNOSIS — I4892 Unspecified atrial flutter: Secondary | ICD-10-CM | POA: Diagnosis not present

## 2015-05-03 DIAGNOSIS — M19041 Primary osteoarthritis, right hand: Secondary | ICD-10-CM | POA: Diagnosis not present

## 2015-05-03 DIAGNOSIS — I10 Essential (primary) hypertension: Secondary | ICD-10-CM | POA: Diagnosis not present

## 2015-05-03 DIAGNOSIS — M19042 Primary osteoarthritis, left hand: Secondary | ICD-10-CM | POA: Diagnosis not present

## 2015-05-03 DIAGNOSIS — I051 Rheumatic mitral insufficiency: Secondary | ICD-10-CM | POA: Diagnosis not present

## 2015-05-03 DIAGNOSIS — Z7901 Long term (current) use of anticoagulants: Secondary | ICD-10-CM | POA: Diagnosis not present

## 2015-05-03 DIAGNOSIS — Z471 Aftercare following joint replacement surgery: Secondary | ICD-10-CM | POA: Diagnosis not present

## 2015-05-03 DIAGNOSIS — M1711 Unilateral primary osteoarthritis, right knee: Secondary | ICD-10-CM | POA: Diagnosis not present

## 2015-05-03 DIAGNOSIS — K5909 Other constipation: Secondary | ICD-10-CM | POA: Diagnosis not present

## 2015-05-04 DIAGNOSIS — Z96651 Presence of right artificial knee joint: Secondary | ICD-10-CM | POA: Diagnosis not present

## 2015-05-04 DIAGNOSIS — M19041 Primary osteoarthritis, right hand: Secondary | ICD-10-CM | POA: Diagnosis not present

## 2015-05-04 DIAGNOSIS — Z7901 Long term (current) use of anticoagulants: Secondary | ICD-10-CM | POA: Diagnosis not present

## 2015-05-04 DIAGNOSIS — I4892 Unspecified atrial flutter: Secondary | ICD-10-CM | POA: Diagnosis not present

## 2015-05-04 DIAGNOSIS — M19042 Primary osteoarthritis, left hand: Secondary | ICD-10-CM | POA: Diagnosis not present

## 2015-05-04 DIAGNOSIS — K5909 Other constipation: Secondary | ICD-10-CM | POA: Diagnosis not present

## 2015-05-04 DIAGNOSIS — M1712 Unilateral primary osteoarthritis, left knee: Secondary | ICD-10-CM | POA: Diagnosis not present

## 2015-05-04 DIAGNOSIS — Z471 Aftercare following joint replacement surgery: Secondary | ICD-10-CM | POA: Diagnosis not present

## 2015-05-04 DIAGNOSIS — I051 Rheumatic mitral insufficiency: Secondary | ICD-10-CM | POA: Diagnosis not present

## 2015-05-04 DIAGNOSIS — M810 Age-related osteoporosis without current pathological fracture: Secondary | ICD-10-CM | POA: Diagnosis not present

## 2015-05-04 DIAGNOSIS — Z79891 Long term (current) use of opiate analgesic: Secondary | ICD-10-CM | POA: Diagnosis not present

## 2015-05-04 DIAGNOSIS — I10 Essential (primary) hypertension: Secondary | ICD-10-CM | POA: Diagnosis not present

## 2015-05-05 DIAGNOSIS — Z471 Aftercare following joint replacement surgery: Secondary | ICD-10-CM | POA: Diagnosis not present

## 2015-05-05 DIAGNOSIS — I4892 Unspecified atrial flutter: Secondary | ICD-10-CM | POA: Diagnosis not present

## 2015-05-05 DIAGNOSIS — K5909 Other constipation: Secondary | ICD-10-CM | POA: Diagnosis not present

## 2015-05-05 DIAGNOSIS — M1712 Unilateral primary osteoarthritis, left knee: Secondary | ICD-10-CM | POA: Diagnosis not present

## 2015-05-05 DIAGNOSIS — I10 Essential (primary) hypertension: Secondary | ICD-10-CM | POA: Diagnosis not present

## 2015-05-05 DIAGNOSIS — M19041 Primary osteoarthritis, right hand: Secondary | ICD-10-CM | POA: Diagnosis not present

## 2015-05-05 DIAGNOSIS — I051 Rheumatic mitral insufficiency: Secondary | ICD-10-CM | POA: Diagnosis not present

## 2015-05-05 DIAGNOSIS — M19042 Primary osteoarthritis, left hand: Secondary | ICD-10-CM | POA: Diagnosis not present

## 2015-05-05 DIAGNOSIS — Z96651 Presence of right artificial knee joint: Secondary | ICD-10-CM | POA: Diagnosis not present

## 2015-05-05 DIAGNOSIS — M810 Age-related osteoporosis without current pathological fracture: Secondary | ICD-10-CM | POA: Diagnosis not present

## 2015-05-05 DIAGNOSIS — Z7901 Long term (current) use of anticoagulants: Secondary | ICD-10-CM | POA: Diagnosis not present

## 2015-05-05 DIAGNOSIS — Z79891 Long term (current) use of opiate analgesic: Secondary | ICD-10-CM | POA: Diagnosis not present

## 2015-05-06 DIAGNOSIS — I4892 Unspecified atrial flutter: Secondary | ICD-10-CM | POA: Diagnosis not present

## 2015-05-06 DIAGNOSIS — M19042 Primary osteoarthritis, left hand: Secondary | ICD-10-CM | POA: Diagnosis not present

## 2015-05-06 DIAGNOSIS — M1712 Unilateral primary osteoarthritis, left knee: Secondary | ICD-10-CM | POA: Diagnosis not present

## 2015-05-06 DIAGNOSIS — Z471 Aftercare following joint replacement surgery: Secondary | ICD-10-CM | POA: Diagnosis not present

## 2015-05-06 DIAGNOSIS — Z79891 Long term (current) use of opiate analgesic: Secondary | ICD-10-CM | POA: Diagnosis not present

## 2015-05-06 DIAGNOSIS — M810 Age-related osteoporosis without current pathological fracture: Secondary | ICD-10-CM | POA: Diagnosis not present

## 2015-05-06 DIAGNOSIS — M19041 Primary osteoarthritis, right hand: Secondary | ICD-10-CM | POA: Diagnosis not present

## 2015-05-06 DIAGNOSIS — I10 Essential (primary) hypertension: Secondary | ICD-10-CM | POA: Diagnosis not present

## 2015-05-06 DIAGNOSIS — Z96651 Presence of right artificial knee joint: Secondary | ICD-10-CM | POA: Diagnosis not present

## 2015-05-06 DIAGNOSIS — I051 Rheumatic mitral insufficiency: Secondary | ICD-10-CM | POA: Diagnosis not present

## 2015-05-06 DIAGNOSIS — Z7901 Long term (current) use of anticoagulants: Secondary | ICD-10-CM | POA: Diagnosis not present

## 2015-05-06 DIAGNOSIS — K5909 Other constipation: Secondary | ICD-10-CM | POA: Diagnosis not present

## 2015-05-07 ENCOUNTER — Ambulatory Visit (INDEPENDENT_AMBULATORY_CARE_PROVIDER_SITE_OTHER): Payer: Medicare Other | Admitting: Internal Medicine

## 2015-05-07 ENCOUNTER — Telehealth: Payer: Self-pay | Admitting: *Deleted

## 2015-05-07 VITALS — BP 145/86 | HR 82 | Temp 98.4°F | Wt 173.5 lb

## 2015-05-07 DIAGNOSIS — M19042 Primary osteoarthritis, left hand: Secondary | ICD-10-CM | POA: Diagnosis not present

## 2015-05-07 DIAGNOSIS — M159 Polyosteoarthritis, unspecified: Secondary | ICD-10-CM

## 2015-05-07 DIAGNOSIS — I4892 Unspecified atrial flutter: Secondary | ICD-10-CM | POA: Diagnosis not present

## 2015-05-07 DIAGNOSIS — M8949 Other hypertrophic osteoarthropathy, multiple sites: Secondary | ICD-10-CM

## 2015-05-07 DIAGNOSIS — I483 Typical atrial flutter: Secondary | ICD-10-CM

## 2015-05-07 DIAGNOSIS — I10 Essential (primary) hypertension: Secondary | ICD-10-CM

## 2015-05-07 DIAGNOSIS — K5909 Other constipation: Secondary | ICD-10-CM | POA: Diagnosis not present

## 2015-05-07 DIAGNOSIS — Z96651 Presence of right artificial knee joint: Secondary | ICD-10-CM

## 2015-05-07 DIAGNOSIS — Z7901 Long term (current) use of anticoagulants: Secondary | ICD-10-CM | POA: Diagnosis not present

## 2015-05-07 DIAGNOSIS — I051 Rheumatic mitral insufficiency: Secondary | ICD-10-CM | POA: Diagnosis not present

## 2015-05-07 DIAGNOSIS — Z79891 Long term (current) use of opiate analgesic: Secondary | ICD-10-CM | POA: Diagnosis not present

## 2015-05-07 DIAGNOSIS — M1712 Unilateral primary osteoarthritis, left knee: Secondary | ICD-10-CM | POA: Diagnosis not present

## 2015-05-07 DIAGNOSIS — M19041 Primary osteoarthritis, right hand: Secondary | ICD-10-CM | POA: Diagnosis not present

## 2015-05-07 DIAGNOSIS — M810 Age-related osteoporosis without current pathological fracture: Secondary | ICD-10-CM

## 2015-05-07 DIAGNOSIS — M150 Primary generalized (osteo)arthritis: Secondary | ICD-10-CM

## 2015-05-07 DIAGNOSIS — Z8719 Personal history of other diseases of the digestive system: Secondary | ICD-10-CM

## 2015-05-07 DIAGNOSIS — N904 Leukoplakia of vulva: Secondary | ICD-10-CM

## 2015-05-07 DIAGNOSIS — Z471 Aftercare following joint replacement surgery: Secondary | ICD-10-CM | POA: Diagnosis not present

## 2015-05-07 DIAGNOSIS — K220 Achalasia of cardia: Secondary | ICD-10-CM

## 2015-05-07 NOTE — Assessment & Plan Note (Signed)
Assessment  Her blood pressure is at the target of < 150/90 today when measured at 145/86.  This is on amlodipine 10 mg daily.  Plan  We will continue the amlodipine 10 mg daily and reassess the blood pressure at the follow-up visit.

## 2015-05-07 NOTE — Assessment & Plan Note (Signed)
Assessment  She has had no recurrence of her symptoms after the botox and dilitation.  Plan  We will reassess for symptoms at the follow-up visit.

## 2015-05-07 NOTE — Progress Notes (Signed)
   Subjective:    Patient ID: [name], female    DOB: [birth year], 75 y.o.   MRN: [id number]  HPI  Abigail HeritageLizzie M Zwick is here for follow-up of her arthritis after a R TKA 1 month ago, hypertension, atrial flutter, and osteoporosis.. Please see the A&P for the status of the pt's chronic medical problems.  Review of Systems  Constitutional: Negative for fever, activity change, appetite change and unexpected weight change.  Cardiovascular: Positive for leg swelling. Negative for chest pain and palpitations.       Chronic trace edema of bilateral lower extremities  Gastrointestinal: Positive for constipation. Negative for nausea, vomiting and diarrhea.  Genitourinary:       No vaginal or vulvar pruitis  Musculoskeletal: Positive for arthralgias and gait problem.       Left knee pain worse than right knee pain despite right TKA 1 month ago.  Neurological: Negative for dizziness.      Objective:   Physical Exam  Constitutional: She is oriented to person, place, and time. She appears well-developed and well-nourished. No distress.  HENT:  Head: Normocephalic and atraumatic.  Eyes: Conjunctivae are normal. Right eye exhibits no discharge. Left eye exhibits no discharge. No scleral icterus.  Cardiovascular: Normal rate and regular rhythm.  Exam reveals no gallop and no friction rub.   Murmur heard. Pulmonary/Chest: Effort normal and breath sounds normal. No respiratory distress. She has no wheezes. She has no rales.  Abdominal: Soft. Bowel sounds are normal. She exhibits no distension. There is no tenderness. There is no rebound and no guarding.  Musculoskeletal: Normal range of motion. She exhibits edema and tenderness.  Trace bilateral lower extremity edema. Mild tenderness and warmth over incision w/o evidence of erythema or drainage.  Neurological: She is alert and oriented to person, place, and time. She exhibits normal muscle tone.  Skin: Skin is warm and dry. No rash noted. She is  not diaphoretic. No erythema.  Psychiatric: She has a normal mood and affect. Her behavior is normal. Judgment and thought content normal.  Nursing note and vitals reviewed.     Assessment & Plan:   Please see problem oriented charting.

## 2015-05-07 NOTE — Assessment & Plan Note (Signed)
Assessment  She stopped her bisphosphonate in November 2016 after 5 years of therapy.  Plan  We will repeat a DEXA scan in November 2018 to assess for progression of her osteoporosis off of the bisphosphonate therapy.

## 2015-05-07 NOTE — Assessment & Plan Note (Signed)
Assessment  Recent Echo revealed only mild mitral regurgitation and a minimal transvalvular gradient of 4 mm Hg.  She is asymptomatic from this.  Plan  She is scheduled for yearly Echos to assess for progression.  The next one is due in April 2018.

## 2015-05-07 NOTE — Assessment & Plan Note (Signed)
Assessment  She apparently was seen by the dermatologist and presumably the diagnosis of Lichen planus was confirmed as she was started on clobetastol ointment 0.05% ointment PRN with relief of her vaginal and vulvar pruritis.  Plan  Continue the clobetastol ointment 0.05% PRN.  We will reassess for recurrence of her symptoms at the follow-up visit.

## 2015-05-07 NOTE — Patient Instructions (Signed)
It was great to see you today.  I am glad the right knee is continuing to get better after surgery.  1) Consider retrying the syrup to put in your coffee for the constipation.  It is called sorbitol.  You can slowly increase the number of tablespoons in the cough up to 5 a day.  2) Keep taking your other medications as you are.  3) We will get the Dermatology notes for our records.  I will see you back in 6 months, sooner if necessary.

## 2015-05-07 NOTE — Assessment & Plan Note (Signed)
Assessment  She did not notice any improvement in her constipation with the 1 tablespoon of sorbitol in her morning coffee each morning.  She did not titrate up the dose further.  She is now taking an OTC laxative with only modest symptomatic relief.  Plan  She will titrate up the sorbitol up to 5 tablespoons each morning to see if she can better regulate her constipation symptoms.  We will reassess her success in this endeavor at the follow-up visit.

## 2015-05-07 NOTE — Telephone Encounter (Signed)
[facility]-message left on recorder for [facility].Criss AlvineGoldston, Elster Corbello Cassady5/5/201711:24 AM

## 2015-05-07 NOTE — Assessment & Plan Note (Signed)
Assessment  She remains asymptomatic on no nodal agents and is tolerating the apixaban well.  Plan  We will reassess for evidence of symptomatic tachyarrythmias and tolerance of the apixaban at the follow-up visit.

## 2015-05-07 NOTE — Assessment & Plan Note (Signed)
We will offer the zostavax at the follow-up visit.  Otherwise she is up to date on her healthcare maintenance.

## 2015-05-07 NOTE — Assessment & Plan Note (Signed)
Assessment  Recovering well from right total knee arthroplasty 30 days ago.  She continues to receive home physical therapy.  Right knee pain is improved compared to the left and the tramadol is effective in managing her pain.  Plan  We will complete the home physical therapy for her R TKA and continue the as needed tramadol.  It is hoped that some day we may be able to wean this medication as her pain continues to improve.  We will reassess her knee pain at the follow-up visit.

## 2015-05-10 DIAGNOSIS — I4892 Unspecified atrial flutter: Secondary | ICD-10-CM | POA: Diagnosis not present

## 2015-05-10 DIAGNOSIS — Z96651 Presence of right artificial knee joint: Secondary | ICD-10-CM | POA: Diagnosis not present

## 2015-05-10 DIAGNOSIS — Z7901 Long term (current) use of anticoagulants: Secondary | ICD-10-CM | POA: Diagnosis not present

## 2015-05-10 DIAGNOSIS — I10 Essential (primary) hypertension: Secondary | ICD-10-CM | POA: Diagnosis not present

## 2015-05-10 DIAGNOSIS — K5909 Other constipation: Secondary | ICD-10-CM | POA: Diagnosis not present

## 2015-05-10 DIAGNOSIS — M19042 Primary osteoarthritis, left hand: Secondary | ICD-10-CM | POA: Diagnosis not present

## 2015-05-10 DIAGNOSIS — Z79891 Long term (current) use of opiate analgesic: Secondary | ICD-10-CM | POA: Diagnosis not present

## 2015-05-10 DIAGNOSIS — M810 Age-related osteoporosis without current pathological fracture: Secondary | ICD-10-CM | POA: Diagnosis not present

## 2015-05-10 DIAGNOSIS — Z471 Aftercare following joint replacement surgery: Secondary | ICD-10-CM | POA: Diagnosis not present

## 2015-05-10 DIAGNOSIS — I051 Rheumatic mitral insufficiency: Secondary | ICD-10-CM | POA: Diagnosis not present

## 2015-05-10 DIAGNOSIS — M1712 Unilateral primary osteoarthritis, left knee: Secondary | ICD-10-CM | POA: Diagnosis not present

## 2015-05-10 DIAGNOSIS — M19041 Primary osteoarthritis, right hand: Secondary | ICD-10-CM | POA: Diagnosis not present

## 2015-05-12 DIAGNOSIS — K5909 Other constipation: Secondary | ICD-10-CM | POA: Diagnosis not present

## 2015-05-12 DIAGNOSIS — M19042 Primary osteoarthritis, left hand: Secondary | ICD-10-CM | POA: Diagnosis not present

## 2015-05-12 DIAGNOSIS — M810 Age-related osteoporosis without current pathological fracture: Secondary | ICD-10-CM | POA: Diagnosis not present

## 2015-05-12 DIAGNOSIS — I4892 Unspecified atrial flutter: Secondary | ICD-10-CM | POA: Diagnosis not present

## 2015-05-12 DIAGNOSIS — I10 Essential (primary) hypertension: Secondary | ICD-10-CM | POA: Diagnosis not present

## 2015-05-12 DIAGNOSIS — Z471 Aftercare following joint replacement surgery: Secondary | ICD-10-CM | POA: Diagnosis not present

## 2015-05-12 DIAGNOSIS — I051 Rheumatic mitral insufficiency: Secondary | ICD-10-CM | POA: Diagnosis not present

## 2015-05-12 DIAGNOSIS — Z79891 Long term (current) use of opiate analgesic: Secondary | ICD-10-CM | POA: Diagnosis not present

## 2015-05-12 DIAGNOSIS — Z96651 Presence of right artificial knee joint: Secondary | ICD-10-CM | POA: Diagnosis not present

## 2015-05-12 DIAGNOSIS — M19041 Primary osteoarthritis, right hand: Secondary | ICD-10-CM | POA: Diagnosis not present

## 2015-05-12 DIAGNOSIS — M1712 Unilateral primary osteoarthritis, left knee: Secondary | ICD-10-CM | POA: Diagnosis not present

## 2015-05-12 DIAGNOSIS — Z7901 Long term (current) use of anticoagulants: Secondary | ICD-10-CM | POA: Diagnosis not present

## 2015-05-13 DIAGNOSIS — M25561 Pain in right knee: Secondary | ICD-10-CM | POA: Diagnosis not present

## 2015-05-13 DIAGNOSIS — Z96651 Presence of right artificial knee joint: Secondary | ICD-10-CM | POA: Diagnosis not present

## 2015-05-13 DIAGNOSIS — M25661 Stiffness of right knee, not elsewhere classified: Secondary | ICD-10-CM | POA: Diagnosis not present

## 2015-05-14 ENCOUNTER — Ambulatory Visit: Payer: Medicare Other | Admitting: Nurse Practitioner

## 2015-05-18 ENCOUNTER — Telehealth: Payer: Self-pay | Admitting: *Deleted

## 2015-05-18 DIAGNOSIS — I051 Rheumatic mitral insufficiency: Secondary | ICD-10-CM

## 2015-05-18 DIAGNOSIS — M25661 Stiffness of right knee, not elsewhere classified: Secondary | ICD-10-CM | POA: Diagnosis not present

## 2015-05-18 DIAGNOSIS — Z96651 Presence of right artificial knee joint: Secondary | ICD-10-CM | POA: Diagnosis not present

## 2015-05-18 DIAGNOSIS — M25561 Pain in right knee: Secondary | ICD-10-CM | POA: Diagnosis not present

## 2015-05-18 MED ORDER — POTASSIUM CHLORIDE ER 10 MEQ PO TBCR: 10.0000 meq | EXTENDED_RELEASE_TABLET | Freq: Two times a day (BID) | ORAL | Status: DC | PRN

## 2015-05-18 NOTE — Telephone Encounter (Signed)
Her daughter is correct that she is to take the potassium on those days that she is taking the lasix.  The KCl is already on her medication list.  Please remind the daughter that she is to take the KCl on the days she takes the lasix and if she needs me to re-write the potassium I am happy to do so, just let me know.  Thanks,   Peyton NajjarLarry

## 2015-05-18 NOTE — Telephone Encounter (Signed)
Patient's daughter calling to verify medications. Per daughter, patient was recently seen by cardiology who restarted her on PRN Lasix. Daughter is concerned that patient was taken off of potassium, but is now starting Lasix again. Reports needing Lasix 3-4 times per week for leg swelling. Please advise.

## 2015-05-18 NOTE — Telephone Encounter (Signed)
Patient does need a refill of potassium and I will call the daughter and clarify with her the directions. Thanks!

## 2015-05-20 ENCOUNTER — Other Ambulatory Visit: Payer: Self-pay | Admitting: Internal Medicine

## 2015-05-20 DIAGNOSIS — M25561 Pain in right knee: Secondary | ICD-10-CM | POA: Diagnosis not present

## 2015-05-20 DIAGNOSIS — M25661 Stiffness of right knee, not elsewhere classified: Secondary | ICD-10-CM | POA: Diagnosis not present

## 2015-05-20 DIAGNOSIS — M150 Primary generalized (osteo)arthritis: Principal | ICD-10-CM

## 2015-05-20 DIAGNOSIS — Z96651 Presence of right artificial knee joint: Secondary | ICD-10-CM | POA: Diagnosis not present

## 2015-05-20 DIAGNOSIS — M8949 Other hypertrophic osteoarthropathy, multiple sites: Secondary | ICD-10-CM

## 2015-05-20 DIAGNOSIS — M159 Polyosteoarthritis, unspecified: Secondary | ICD-10-CM

## 2015-05-25 DIAGNOSIS — M25561 Pain in right knee: Secondary | ICD-10-CM | POA: Diagnosis not present

## 2015-05-25 DIAGNOSIS — M25661 Stiffness of right knee, not elsewhere classified: Secondary | ICD-10-CM | POA: Diagnosis not present

## 2015-05-25 DIAGNOSIS — Z96651 Presence of right artificial knee joint: Secondary | ICD-10-CM | POA: Diagnosis not present

## 2015-05-27 DIAGNOSIS — M25661 Stiffness of right knee, not elsewhere classified: Secondary | ICD-10-CM | POA: Diagnosis not present

## 2015-05-27 DIAGNOSIS — M25561 Pain in right knee: Secondary | ICD-10-CM | POA: Diagnosis not present

## 2015-05-27 DIAGNOSIS — Z96651 Presence of right artificial knee joint: Secondary | ICD-10-CM | POA: Diagnosis not present

## 2015-06-01 DIAGNOSIS — M25661 Stiffness of right knee, not elsewhere classified: Secondary | ICD-10-CM | POA: Diagnosis not present

## 2015-06-01 DIAGNOSIS — Z96651 Presence of right artificial knee joint: Secondary | ICD-10-CM | POA: Diagnosis not present

## 2015-06-01 DIAGNOSIS — M25561 Pain in right knee: Secondary | ICD-10-CM | POA: Diagnosis not present

## 2015-06-03 ENCOUNTER — Other Ambulatory Visit: Payer: Self-pay | Admitting: Adult Health

## 2015-06-03 DIAGNOSIS — M25661 Stiffness of right knee, not elsewhere classified: Secondary | ICD-10-CM | POA: Diagnosis not present

## 2015-06-03 DIAGNOSIS — M25561 Pain in right knee: Secondary | ICD-10-CM | POA: Diagnosis not present

## 2015-06-03 DIAGNOSIS — Z96651 Presence of right artificial knee joint: Secondary | ICD-10-CM | POA: Diagnosis not present

## 2015-06-04 ENCOUNTER — Encounter (HOSPITAL_COMMUNITY): Payer: Self-pay | Admitting: Orthopaedic Surgery

## 2015-06-04 NOTE — OR Nursing (Signed)
Procedre care complete date changed from 4/6 to correct date of 4/4

## 2015-06-07 DIAGNOSIS — M25561 Pain in right knee: Secondary | ICD-10-CM | POA: Diagnosis not present

## 2015-06-08 DIAGNOSIS — Z96651 Presence of right artificial knee joint: Secondary | ICD-10-CM | POA: Diagnosis not present

## 2015-06-08 DIAGNOSIS — M25561 Pain in right knee: Secondary | ICD-10-CM | POA: Diagnosis not present

## 2015-06-08 DIAGNOSIS — M25661 Stiffness of right knee, not elsewhere classified: Secondary | ICD-10-CM | POA: Diagnosis not present

## 2015-06-11 ENCOUNTER — Encounter: Payer: Self-pay | Admitting: Nurse Practitioner

## 2015-06-11 ENCOUNTER — Ambulatory Visit (INDEPENDENT_AMBULATORY_CARE_PROVIDER_SITE_OTHER): Payer: Medicare Other | Admitting: Nurse Practitioner

## 2015-06-11 VITALS — BP 150/100 | HR 76 | Ht 64.0 in | Wt 171.8 lb

## 2015-06-11 DIAGNOSIS — I10 Essential (primary) hypertension: Secondary | ICD-10-CM

## 2015-06-11 DIAGNOSIS — I099 Rheumatic heart disease, unspecified: Secondary | ICD-10-CM

## 2015-06-11 DIAGNOSIS — M25661 Stiffness of right knee, not elsewhere classified: Secondary | ICD-10-CM | POA: Diagnosis not present

## 2015-06-11 DIAGNOSIS — Z7901 Long term (current) use of anticoagulants: Secondary | ICD-10-CM | POA: Diagnosis not present

## 2015-06-11 DIAGNOSIS — M25561 Pain in right knee: Secondary | ICD-10-CM | POA: Diagnosis not present

## 2015-06-11 DIAGNOSIS — I340 Nonrheumatic mitral (valve) insufficiency: Secondary | ICD-10-CM | POA: Diagnosis not present

## 2015-06-11 NOTE — Patient Instructions (Addendum)
We will be checking the following labs today - NONE   Medication Instructions:    Continue with your current medicines.     Testing/Procedures To Be Arranged:  N/A  Follow-Up:   See me in 6 months.    Other Special Instructions:   N/A    If you need a refill on your cardiac medications before your next appointment, please call your pharmacy.   Call the [facility] office at [phone] if you have any questions, problems or concerns.      

## 2015-06-11 NOTE — Progress Notes (Addendum)
CARDIOLOGY OFFICE NOTE  Date:  06/11/2015    [name] Date of Birth: March 22, 1940 Medical Record #[id number]  PCP:  Rocco Serene, MD  Cardiologist:  Former patient of Dr. Yevonne Pax    Chief Complaint  Patient presents with  . Atrial Fibrillation  . Hypertension  . Cardiac Valve Problem    Follow up visit - former patient of Dr. Yevonne Pax    History of Present Illness: [name] is a 75 y.o. female who presents today for a follow up visit. Former patient of Dr. Yevonne Pax.   She has a history of chronic atrial fib, HTN, OA, rheumatic fever with MV regurgitation and anemia. She is on Eliquis for her anticoagulation. No beta blocker due to bradycardia.   She was found to be in atrial flutter when she presented for a surgical procedure at [facility] back on 04/13/2014.She had had a previous EKG on 03/16/14 which showed that she was in normal sinus rhythm at that time. She was cleared for the surgery and went on to have her surgical procedure without incident. She was found to have cricopharyngeal muscle hypertrophy - was treated with Botox.  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 Pretty limited by her arthritis. Cardiac status ok. She was cleared to get a knee replacement.   Comes back today. Here alone today. She is doing well. She had her right knee replaced - did well - still doing therapy. She notes her left knee is now hurting more. 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 That job was very physical - having to lift him and put in tub, etc. No chest pain. Breathing is good. BP better at home. She has no other real concerns today but does ask about if it would be ok to have left knee replacement if indicated.    Past Medical History  Diagnosis Date  . Essential hypertension   . Osteoarthritis     knees and hands    . Systolic murmur     per patient report she has had rheumatic fever in childhood  . Osteoporosis, post-menopausal 2010    left femur T -2.7, left forearm T -3.5  . Blood transfusion without reported diagnosis     1960's, unknown reason  . Post-menopausal atrophic vaginitis   . Obesity, Class I, BMI 30-34.9   . Anemia 03/16/2014  . Cricopharyngeal achalasia 04/08/2014    Rigid esophagoscopy, dilation, and Botox injection into cricopharyngeus muscle recommended   . Chronic rheumatic heart disease 05/05/2014  . Mitral valve regurgitation, rheumatic 05/05/2014    Moderate to severe with peak PA pressure of 56 mmHg on Echo 04/2014   . Atrial flutter (HCC) 05/07/2014    With controlled ventricular rate on no AV nodal agents   . PONV (postoperative nausea and vomiting)     with recent dilatation of throat, had N/V    Past Surgical History  Procedure Laterality Date  . Abdominal hysterectomy      Fibroids  . Cyst excision      on hand  . Colonoscopy w/ biopsies and polypectomy    . Multiple tooth extractions    . Esophagoscopy with dilitation N/A 04/14/2014    Procedure: RIGID ESOPHAGOSCOPY WITH DILITATION AND BOTOX  INJECTION;  Surgeon: Osborn Coho, MD;  Location: [facility] OR;  Service: ENT;  Laterality: N/A;  . Total knee arthroplasty Right 04/06/2015  . Total knee arthroplasty Right 04/06/2015    Procedure: RIGHT TOTAL KNEE ARTHROPLASTY;  Surgeon: Marcene CorningPeter Dalldorf, MD;  Location: [facility] OR;  Service: Orthopedics;  Laterality: Right;     Medications: Current Outpatient Prescriptions  Medication Sig Dispense Refill  . apixaban (ELIQUIS) 5 MG TABS tablet Take 1 tablet (5 mg total) by mouth 2 (two) times daily. 60 tablet 11  . bisacodyl (BISACODYL) 5 MG EC tablet Take 10 mg by mouth daily as needed (constipation).     . clobetasol ointment (TEMOVATE) 0.05 % Apply 1 application topically daily as needed (for skin irritation).     . furosemide (LASIX) 20 MG tablet TAKE 1 TABLET(20 MG) BY MOUTH DAILY AS  NEEDED FOR FLUID RETENTION 30 tablet 10  . pantoprazole (PROTONIX) 40 MG tablet Take 40 mg by mouth daily.    . potassium chloride (K-DUR) 10 MEQ tablet Take 1 tablet (10 mEq total) by mouth every 12 (twelve) hours as needed (When lasix is given prn for fluid retention). 35 tablet 11  . tizanidine (ZANAFLEX) 2 MG capsule Take 2 mg by mouth 3 (three) times daily as needed for muscle spasms (pain in knees).    . traMADol (ULTRAM) 50 MG tablet Take 100 mg by mouth every 6 (six) hours as needed for moderate pain or severe pain (both knees).    . vitamin B-12 (CYANOCOBALAMIN) 1000 MCG tablet Take 1,000 mcg by mouth daily.     No current facility-administered medications for this visit.    Allergies: Allergies  Allergen Reactions  . Ace Inhibitors Cough    Social History: The patient  reports that she has never smoked. She has never used smokeless tobacco. She reports that she does not drink alcohol or use illicit drugs.   Family History: The patient's family history includes Asthma in her sister; Cancer in her brother, brother, and mother; Healthy in her daughter, daughter, daughter, and son; Other in her brother, brother, brother, brother, brother, and sister; Stroke in her father; Unexplained death in her sister.   Review of Systems: Please see the history of present illness.   Otherwise, the review of systems is positive for none.   All other systems are reviewed and negative.   Physical Exam: VS:  BP 150/100 mmHg  Pulse 76  Ht 5\' 4"  (1.626 m)  Wt 171 lb 12.8 oz (77.928 kg)  BMI 29.47 kg/m2 .  BMI Body mass index is 29.47 kg/(m^2).  Wt Readings from Last 3 Encounters:  06/11/15 171 lb 12.8 oz (77.928 kg)  05/07/15 173 lb 8 oz (78.699 kg)  04/22/15 169 lb 3.2 oz (76.749 kg)   BP is 140/80 by me.   General: Pleasant. Well developed, well nourished and in no acute distress.   HEENT: Normal.  Neck: Supple, no JVD, carotid bruits, or masses noted.  Cardiac: Regular rate and rhythm.  Soft murmur noted. No edema.  Respiratory:  Lungs are clear to auscultation bilaterally with normal work of breathing.  GI: Soft and nontender.  MS: No deformity or atrophy. Gait and ROM intact. Right knee incision looks great.  Skin: Warm and dry. Color is normal.  Neuro:  Strength and sensation are intact and no gross focal deficits noted.  Psych: Alert, appropriate and with normal affect.   LABORATORY DATA:  EKG:  EKG is not ordered today.  Lab Results  Component Value Date   WBC 9.9 04/12/2015  HGB 11.2* 04/12/2015   HCT 36 04/12/2015   PLT 335 04/12/2015   GLUCOSE 105* 03/26/2015   CHOL 157 02/16/2015   TRIG 69 02/16/2015   HDL 47 02/16/2015   LDLCALC 96 02/16/2015   ALT 14 02/16/2015   AST 19 02/16/2015   NA 139 04/12/2015   K 4.1 04/12/2015   CL 104 03/26/2015   CREATININE 0.6 04/12/2015   BUN 10 04/12/2015   CO2 22 03/26/2015   TSH 2.97 02/16/2015   INR 1.15 04/06/2015   HGBA1C 5.6 03/16/2014    BNP (last 3 results) No results for input(s): BNP in the last 8760 hours.  ProBNP (last 3 results) No results for input(s): PROBNP in the last 8760 hours.   Other Studies Reviewed Today:  Echo Study Conclusions from 04/2015  - Left ventricle: The cavity size was normal. There was mild focal  basal hypertrophy of the septum. Systolic function was normal.  The estimated ejection fraction was in the range of 55% to 60%.  Wall motion was normal; there were no regional wall motion  abnormalities. - Aortic valve: There was mild stenosis. - Mitral valve: There was mild regurgitation. - Left atrium: The atrium was moderately to severely dilated. - Pulmonary arteries: Systolic pressure was mildly increased. PA  peak pressure: 32 mm Hg (S).  Impressions:  - Mitral regurgitation was previously described as moderate to  severe. Images could not be directly compared.    Assessment/Plan: 1. PAF/flutter - managed with rate control and anticoagulation - she  is in a regular rhythm by exam today.   2. Chronic anticoagulation - no problems noted.   3. HTN - BP recheck by me looks better. I have left her on her current regimen.    4. Rheumatic heart disease with MR - recent echo from this past April was stable  5. Fatigue - seems improved.   6. Recent knee replacement - she should be an acceptable candidate if she desires to have the left knee operated on. I do not think she should go back to this physical job that she has had previously - I think that is too much for her.   Current medicines are reviewed with the patient today.  The patient does not have concerns regarding medicines other than what has been noted above.  The following changes have been made:  See above.  Labs/ tests ordered today include:   No orders of the defined types were placed in this encounter.     Disposition:   FU with me in 6 months.   Patient is agreeable to this plan and will call if any problems develop in the interim.   Signed: Rosalio Macadamia, RN, ANP-C 06/11/2015 11:51 AM  [facility] [address] [place], [place]  [address] Phone: [phone] Fax: [phone]        Addendum:  Her clearance for left TKR has already been faxed - will be sending again - she is an acceptable candidate to proceed with her surgery. She held her Eliquis for 2 days prior to her last TKR - would repeat this recommendation.   Rosalio Macadamia, RN, ANP-C [facility] [address] [place], [place]  [address] [phone]

## 2015-06-18 DIAGNOSIS — M25561 Pain in right knee: Secondary | ICD-10-CM | POA: Diagnosis not present

## 2015-06-18 DIAGNOSIS — Z96651 Presence of right artificial knee joint: Secondary | ICD-10-CM | POA: Diagnosis not present

## 2015-06-18 DIAGNOSIS — M25661 Stiffness of right knee, not elsewhere classified: Secondary | ICD-10-CM | POA: Diagnosis not present

## 2015-06-20 ENCOUNTER — Other Ambulatory Visit: Payer: Self-pay | Admitting: Adult Health

## 2015-06-22 DIAGNOSIS — M25661 Stiffness of right knee, not elsewhere classified: Secondary | ICD-10-CM | POA: Diagnosis not present

## 2015-06-22 DIAGNOSIS — Z96651 Presence of right artificial knee joint: Secondary | ICD-10-CM | POA: Diagnosis not present

## 2015-06-22 DIAGNOSIS — M25561 Pain in right knee: Secondary | ICD-10-CM | POA: Diagnosis not present

## 2015-06-29 ENCOUNTER — Telehealth: Payer: Self-pay

## 2015-06-29 NOTE — Telephone Encounter (Signed)
[name] is a 75 y.o. female who was contacted via telephone for monitoring of apixaban (Eliquis) therapy.    ASSESSMENT Indication(s): atrial fibrillation Duration: indefinite  Labs:    Component Value Date/Time   AST 19 02/16/2015 1513   ALT 14 02/16/2015 1513   NA 139 04/12/2015   NA 138 03/26/2015 1550   K 4.1 04/12/2015   CL 104 03/26/2015 1550   CO2 22 03/26/2015 1550   GLUCOSE 105* 03/26/2015 1550   GLUCOSE 86 11/06/2014 1041   HGBA1C 5.6 03/16/2014 0616   BUN 10 04/12/2015   BUN 9 03/26/2015 1550   CREATININE 0.6 04/12/2015   CREATININE 0.71 03/26/2015 1550   CREATININE 0.98* 03/08/2015 1448   CALCIUM 9.4 03/26/2015 1550   GFRNONAA >60 03/26/2015 1550   GFRNONAA 86 05/22/2014 1202   GFRAA >60 03/26/2015 1550   GFRAA >89 05/22/2014 1202   WBC 9.9 04/12/2015   WBC 7.1 04/06/2015 1142   HGB 11.2* 04/12/2015   HCT 36 04/12/2015   PLT 335 04/12/2015    apixaban (Eliquis) Dose: 5 mg BID  Safety: Patient has not had recent bleeding/thromboembolic events. Patient reports no recent signs or symptoms of bleeding, no signs of symptoms of thromboembolism. Medication changes: no.  Adherence: Patient reports no known adherence challenges. Patient does correctly recite the dose. Contacted pharmacy and records indicate refills are consistent. Last fills (one month supply): 5/27, 4/20, 3/21, 2/22, 1/27, 1/3. Patient did have many questions about medications and requested us to call next month to make sure she is taking her medications correctly. Stated cost was an issue for Eliquis. She hasn't worked since March and co-pay is ~$50/month and would like help.  Patient Instructions: Patient advised to contact clinic or seek medical attention if signs/symptoms of bleeding or thromboembolism occur. Patient verbalized understanding by repeating back information.  Follow-up No follow up appointment scheduled.  Duane LopeHailey L Dwon Sky PharmD Candidate  06/29/2015, 11:22 AM

## 2015-07-02 ENCOUNTER — Other Ambulatory Visit: Payer: Self-pay | Admitting: Adult Health

## 2015-07-07 ENCOUNTER — Other Ambulatory Visit: Payer: Self-pay | Admitting: Adult Health

## 2015-07-08 DIAGNOSIS — Z96651 Presence of right artificial knee joint: Secondary | ICD-10-CM | POA: Diagnosis not present

## 2015-07-08 DIAGNOSIS — M25661 Stiffness of right knee, not elsewhere classified: Secondary | ICD-10-CM | POA: Diagnosis not present

## 2015-07-08 DIAGNOSIS — M25561 Pain in right knee: Secondary | ICD-10-CM | POA: Diagnosis not present

## 2015-07-09 ENCOUNTER — Other Ambulatory Visit: Payer: Self-pay | Admitting: *Deleted

## 2015-07-09 ENCOUNTER — Telehealth: Payer: Self-pay

## 2015-07-09 DIAGNOSIS — I10 Essential (primary) hypertension: Secondary | ICD-10-CM

## 2015-07-09 MED ORDER — AMLODIPINE BESYLATE 10 MG PO TABS: 10.0000 mg | ORAL_TABLET | Freq: Every day | ORAL | Status: DC

## 2015-07-09 NOTE — Telephone Encounter (Signed)
This has been done.

## 2015-07-09 NOTE — Telephone Encounter (Signed)
Requesting amlodipine to be filled.

## 2015-07-12 DIAGNOSIS — M1712 Unilateral primary osteoarthritis, left knee: Secondary | ICD-10-CM | POA: Diagnosis not present

## 2015-08-05 ENCOUNTER — Other Ambulatory Visit: Payer: Self-pay | Admitting: Orthopaedic Surgery

## 2015-08-06 NOTE — Telephone Encounter (Signed)
Patient was contacted  by Hailey Hill, PharmD candidate. I agree with the assessment and plan of care documented. 

## 2015-08-10 ENCOUNTER — Ambulatory Visit (INDEPENDENT_AMBULATORY_CARE_PROVIDER_SITE_OTHER): Payer: Medicare Other | Admitting: Pharmacist

## 2015-08-10 DIAGNOSIS — Z7901 Long term (current) use of anticoagulants: Secondary | ICD-10-CM | POA: Diagnosis not present

## 2015-08-10 DIAGNOSIS — Z79899 Other long term (current) drug therapy: Secondary | ICD-10-CM

## 2015-08-10 DIAGNOSIS — Z7189 Other specified counseling: Secondary | ICD-10-CM

## 2015-08-10 NOTE — Patient Instructions (Addendum)
Patient stated she needs help affording apixaban, would be able to pay copay next month ($45/month).  Medication Samples have been provided to the patient.  Drug: apixaban (Eliquis) Strength: 5 mg Qty: 56  LOT: [id number] Exp.Date: 09/2017 Dosing instructions: take 1 tablet by mouth BID  The patient has been instructed regarding the correct time, dose, and frequency of taking this medication, including desired effects and most common side effects.   Nikoletta Varma J 10:32 AM 08/10/2015

## 2015-08-18 ENCOUNTER — Other Ambulatory Visit: Payer: Self-pay | Admitting: Internal Medicine

## 2015-08-18 ENCOUNTER — Encounter: Payer: Self-pay | Admitting: Nurse Practitioner

## 2015-08-18 ENCOUNTER — Telehealth: Payer: Self-pay | Admitting: *Deleted

## 2015-08-18 DIAGNOSIS — Z1231 Encounter for screening mammogram for malignant neoplasm of breast: Secondary | ICD-10-CM

## 2015-08-18 NOTE — Telephone Encounter (Signed)
S/w pt's daughter per DPR.  Is aware paperwork for surgical clearance was sent twice and it is also in Epic under media.  The instructions to hold blood thinner on paperwork same as last surgery.  Faxed over to [facility] Attention : Agustin CreeKathy Blume @ [phone].

## 2015-08-23 DIAGNOSIS — H43813 Vitreous degeneration, bilateral: Secondary | ICD-10-CM | POA: Diagnosis not present

## 2015-08-23 DIAGNOSIS — H2513 Age-related nuclear cataract, bilateral: Secondary | ICD-10-CM | POA: Diagnosis not present

## 2015-08-23 DIAGNOSIS — H40013 Open angle with borderline findings, low risk, bilateral: Secondary | ICD-10-CM | POA: Diagnosis not present

## 2015-08-23 DIAGNOSIS — H25013 Cortical age-related cataract, bilateral: Secondary | ICD-10-CM | POA: Diagnosis not present

## 2015-08-26 ENCOUNTER — Ambulatory Visit
Admission: RE | Admit: 2015-08-26 | Discharge: 2015-08-26 | Disposition: A | Discharge status: Home / Self Care | Payer: Medicare Other | Source: Ambulatory Visit | Attending: Internal Medicine | Admitting: Internal Medicine

## 2015-08-26 DIAGNOSIS — Z1231 Encounter for screening mammogram for malignant neoplasm of breast: Secondary | ICD-10-CM

## 2015-09-03 ENCOUNTER — Encounter (HOSPITAL_COMMUNITY)
Admission: RE | Admit: 2015-09-03 | Discharge: 2015-09-03 | Disposition: A | Discharge status: Home / Self Care | Payer: Medicare Other | Source: Ambulatory Visit | Attending: Orthopaedic Surgery | Admitting: Orthopaedic Surgery

## 2015-09-03 ENCOUNTER — Encounter (HOSPITAL_COMMUNITY): Payer: Self-pay

## 2015-09-03 DIAGNOSIS — Z01812 Encounter for preprocedural laboratory examination: Secondary | ICD-10-CM | POA: Diagnosis not present

## 2015-09-03 DIAGNOSIS — M1712 Unilateral primary osteoarthritis, left knee: Secondary | ICD-10-CM | POA: Diagnosis not present

## 2015-09-03 DIAGNOSIS — Z0183 Encounter for blood typing: Secondary | ICD-10-CM | POA: Insufficient documentation

## 2015-09-03 HISTORY — DX: Localized edema: R60.0

## 2015-09-03 HISTORY — DX: Personal history of other infectious and parasitic diseases: Z86.19

## 2015-09-03 HISTORY — DX: Constipation, unspecified: K59.00

## 2015-09-03 HISTORY — DX: Nocturia: R35.1

## 2015-09-03 HISTORY — DX: Gastro-esophageal reflux disease without esophagitis: K21.9

## 2015-09-03 HISTORY — DX: Other muscle spasm: M62.838

## 2015-09-03 HISTORY — DX: Personal history of other diseases of the nervous system and sense organs: Z86.69

## 2015-09-03 HISTORY — DX: Personal history of other medical treatment: Z92.89

## 2015-09-03 HISTORY — DX: Edema, unspecified: R60.9

## 2015-09-03 LAB — CBC WITH DIFFERENTIAL/PLATELET
BASOS ABS: 0 10*3/uL (ref 0.0–0.1)
Basophils Relative: 0 %
Eosinophils Absolute: 0.1 10*3/uL (ref 0.0–0.7)
Eosinophils Relative: 1 %
HEMATOCRIT: 46.1 % — AB (ref 36.0–46.0)
Hemoglobin: 14.6 g/dL (ref 12.0–15.0)
LYMPHS PCT: 20 %
Lymphs Abs: 1.5 10*3/uL (ref 0.7–4.0)
MCH: 27.9 pg (ref 26.0–34.0)
MCHC: 31.7 g/dL (ref 30.0–36.0)
MCV: 88 fL (ref 78.0–100.0)
Monocytes Absolute: 0.5 10*3/uL (ref 0.1–1.0)
Monocytes Relative: 6 %
NEUTROS ABS: 5.5 10*3/uL (ref 1.7–7.7)
NEUTROS PCT: 73 %
PLATELETS: 250 10*3/uL (ref 150–400)
RBC: 5.24 MIL/uL — AB (ref 3.87–5.11)
RDW: 15.4 % (ref 11.5–15.5)
WBC: 7.7 10*3/uL (ref 4.0–10.5)

## 2015-09-03 LAB — URINALYSIS, ROUTINE W REFLEX MICROSCOPIC
BILIRUBIN URINE: NEGATIVE
Glucose, UA: NEGATIVE mg/dL
HGB URINE DIPSTICK: NEGATIVE
Ketones, ur: NEGATIVE mg/dL
Leukocytes, UA: NEGATIVE
Nitrite: NEGATIVE
Protein, ur: NEGATIVE mg/dL
SPECIFIC GRAVITY, URINE: 1.021 (ref 1.005–1.030)
pH: 6.5 (ref 5.0–8.0)

## 2015-09-03 LAB — TYPE AND SCREEN
ABO/RH(D): O POS
ANTIBODY SCREEN: NEGATIVE

## 2015-09-03 LAB — BASIC METABOLIC PANEL
ANION GAP: 10 (ref 5–15)
BUN: 8 mg/dL (ref 6–20)
CO2: 24 mmol/L (ref 22–32)
Calcium: 9.6 mg/dL (ref 8.9–10.3)
Chloride: 106 mmol/L (ref 101–111)
Creatinine, Ser: 0.75 mg/dL (ref 0.44–1.00)
GFR calc Af Amer: >60 mL/min (ref >60)
Glucose, Bld: 113 mg/dL — ABNORMAL HIGH (ref 65–99)
POTASSIUM: 3.8 mmol/L (ref 3.5–5.1)
SODIUM: 140 mmol/L (ref 135–145)

## 2015-09-03 LAB — SURGICAL PCR SCREEN
MRSA, PCR: NEGATIVE
Staphylococcus aureus: NEGATIVE

## 2015-09-03 LAB — PROTIME-INR
INR: 1.25
Prothrombin Time: 15.8 seconds — ABNORMAL HIGH (ref 11.4–15.2)

## 2015-09-03 LAB — APTT: APTT: 38 s — AB (ref 24–36)

## 2015-09-03 MED ORDER — CHLORHEXIDINE GLUCONATE 4 % EX LIQD: 60.0000 mL | Freq: Once | CUTANEOUS | Status: DC

## 2015-09-03 NOTE — Progress Notes (Signed)
Last saw NP Norma FredricksonLori Gerhardt with cardiology on 06-11-15  Medical Md  Echo reports in epic from 2009/2016/2017  Stress test  Heart cath  EKG in epic from 03-26-15  CXR in epic from 04-06-15

## 2015-09-03 NOTE — Pre-Procedure Instructions (Addendum)
Abigail HeritageLizzie M [name]  09/03/2015      [facility] [phone] [place] - [address] [facility] [name] [place] [id number] Phone: [phone] Fax: [phone]    Your procedure is scheduled on Tues, Sept 12 @ 7:30 AM  [facility]  Call this number if you have problems the morning of surgery:  [phone]   Remember:  Do not eat food or drink liquids after midnight.  Take these medicines the morning of surgery with A SIP OF WATER Amlodipine(Norvasc),Pantoprazole(Protonix), And Tramadol(Ultram-if needed)                No Goody's,BC's,Aleve,Advil,Motrin,Ibuprofen,Fish Oil,or any Herbal Medications.    Hold Eliquis 2 days prior to surgery per Cardiology.   Do not wear jewelry, make-up or nail polish.  Do not wear lotions, powders, or perfumes, or deoderant.  Do not shave 48 hours prior to surgery.    Do not bring valuables to the hospital.  [facility] is not responsible for any belongings or valuables.  Contacts, dentures or bridgework may not be worn into surgery.  Leave your suitcase in the car.  After surgery it may be brought to your room.  For patients admitted to the hospital, discharge time will be determined by your treatment team.  Patients discharged the day of surgery will not be allowed to drive home.    [facility]  Before surgery, you can play an important role.  Because skin is not sterile, your skin needs to be as free of germs as possible.  You can reduce the number of germs on you skin by washing with CHG (chlorahexidine gluconate) soap before surgery.  CHG is an antiseptic cleaner which kills germs and bonds with the skin to continue killing germs even after washing.  Please DO NOT use if you have an allergy to CHG or antibacterial soaps.  If your skin becomes reddened/irritated stop using the CHG and inform your nurse when you  arrive at Short Stay.  Do not shave (including legs and underarms) for at least 48 hours prior to the first CHG shower.  You may shave your face.  Please follow these instructions carefully:   1.  Shower with CHG Soap the night before surgery and the                                morning of Surgery.  2.  If you choose to wash your hair, wash your hair first as usual with your       normal shampoo.  3.  After you shampoo, rinse your hair and body thoroughly to remove the                      Shampoo.  4.  Use CHG as you would any other liquid soap.  You can apply chg directly       to the skin and wash gently with scrungie or a clean washcloth.  5.  Apply the CHG Soap to your body ONLY FROM THE NECK DOWN.        Do not use on open wounds or open sores.  Avoid contact with your eyes,       ears, mouth and genitals (private parts).  Wash genitals (private parts)  with your normal soap.  6.  Wash thoroughly, paying special attention to the area where your surgery        will be performed.  7.  Thoroughly rinse your body with warm water from the neck down.  8.  DO NOT shower/wash with your normal soap after using and rinsing off       the CHG Soap.  9.  Pat yourself dry with a clean towel.            10.  Wear clean pajamas.            11.  Place clean sheets on your bed the night of your first shower and do not        sleep with pets.  Day of Surgery  Do not apply any lotions/deoderants the morning of surgery.  Please wear clean clothes to the hospital/surgery center.    Please read over the following fact sheets that you were given. MRSA Information and Surgical Site Infection Prevention

## 2015-09-10 DIAGNOSIS — H40033 Anatomical narrow angle, bilateral: Secondary | ICD-10-CM | POA: Diagnosis not present

## 2015-09-10 DIAGNOSIS — H40013 Open angle with borderline findings, low risk, bilateral: Secondary | ICD-10-CM | POA: Diagnosis not present

## 2015-09-11 NOTE — H&P (Signed)
TOTAL KNEE ADMISSION H&P  Patient is being admitted for left total knee arthroplasty.  Subjective:  Chief Complaint:left knee pain.  HPI: CHE BELOW, 75 y.o. female, has a history of pain and functional disability in the left knee due to arthritis and has failed non-surgical conservative treatments for greater than 12 weeks to includeNSAID's and/or analgesics, corticosteriod injections, viscosupplementation injections, flexibility and strengthening excercises, use of assistive devices, weight reduction as appropriate and activity modification.  Onset of symptoms was gradual, starting 5 years ago with gradually worsening course since that time. The patient noted no past surgery on the left knee(s).  Patient currently rates pain in the left knee(s) at 10 out of 10 with activity. Patient has night pain, worsening of pain with activity and weight bearing, pain that interferes with activities of daily living, crepitus and joint swelling.  Patient has evidence of subchondral cysts, subchondral sclerosis, periarticular osteophytes and joint space narrowing by imaging studies. There is no active infection.  Patient Active Problem List   Diagnosis Date Noted  . Lichen sclerosus of female genitalia 02/05/2015  . Chronic constipation 11/06/2014  . Atrial flutter (HCC) 05/07/2014  . Chronic rheumatic heart disease 05/05/2014  . Mitral valve regurgitation, rheumatic 05/05/2014  . Cricopharyngeal achalasia 04/08/2014  . Normocytic anemia 03/16/2014  . Sigmoid diverticulosis 03/16/2014  . Internal hemorrhoids 03/16/2014  . Obesity, Class I, BMI 30-34.9 04/26/2012  . Postmenopausal atrophic vaginitis 05/10/2011  . Healthcare maintenance 11/07/2010  . Osteoporosis, post-menopausal 10/21/2010  . Primary osteoarthritis involving multiple joints 01/12/2006  . Essential hypertension 11/20/2005   Past Medical History:  Diagnosis Date  . Anemia 03/16/2014  . Atrial flutter (HCC) 05/07/2014   With  controlled ventricular rate on no AV nodal agents.  . Chronic rheumatic heart disease 05/05/2014  . Constipation    takes Dulcolax as needed  . 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 Essential hypertension    takes Amlodipine daily  . GERD (gastroesophageal reflux disease)    takes Pantoprazole daily  . History of blood transfusion    no abnormal reaction noted  . History of migraine    last one a week  . History of shingles   . Mitral valve regurgitation, rheumatic 05/05/2014   Moderate to severe with peak PA pressure of 56 mmHg on Echo 04/2014   . Muscle spasm of both lower legs    takes Zanaflex daily as needed  . Nocturia   . Osteoarthritis    knees and hands  . Peripheral edema    takes Lasix daily as needed   . PONV (postoperative nausea and vomiting)    with recent dilatation of throat, had N/V  . Systolic murmur    per patient report she has had rheumatic fever in childhood    Past Surgical History:  Procedure Laterality Date  . ABDOMINAL HYSTERECTOMY     Fibroids  . COLONOSCOPY W/ BIOPSIES AND POLYPECTOMY    . CYST EXCISION Right    on hand  . ESOPHAGOSCOPY WITH DILITATION N/A 04/14/2014   Procedure: RIGID ESOPHAGOSCOPY WITH DILITATION AND BOTOX  INJECTION;  Surgeon: Osborn Coho, MD;  Location: [facility] OR;  Service: ENT;  Laterality: N/A;  . MULTIPLE TOOTH EXTRACTIONS    . TOTAL KNEE ARTHROPLASTY Right 04/06/2015  . TOTAL KNEE ARTHROPLASTY Right 04/06/2015   Procedure: RIGHT TOTAL KNEE ARTHROPLASTY;  Surgeon: Marcene Corning, MD;  Location: [facility] OR;  Service: Orthopedics;  Laterality: Right;  No prescriptions prior to admission.   Allergies  Allergen Reactions  . Ace Inhibitors Cough    Social History  Substance Use Topics  . Smoking status: Never Smoker  . Smokeless tobacco: Never Used  . Alcohol use No    Family History  Problem Relation Age of Onset  . Cancer Mother      Unknown type  . Stroke Father   . Unexplained death Sister   . Other Brother     Unknown health  . Healthy Daughter   . Healthy Son   . Asthma Sister   . Other Sister     Unknown health  . Other Brother     Unknown health  . Other Brother     Unknown health  . Cancer Brother     Unknown type  . Cancer Brother     Unknown type  . Other Brother     Accident  . Other Brother     Garment/textile technologistndustrial accident  . Healthy Daughter   . Healthy Daughter      Review of Systems  Musculoskeletal: Positive for joint pain.       Left knee  All other systems reviewed and are negative.   Objective:  Physical Exam  Constitutional: She is oriented to person, place, and time. She appears well-developed and well-nourished.  HENT:  Head: Normocephalic and atraumatic.  Eyes: Pupils are equal, round, and reactive to light.  Neck: Normal range of motion.  Cardiovascular: Normal rate and regular rhythm.   Respiratory: Effort normal.  GI: Soft.  Musculoskeletal:  Left knee has trace effusion. Her motion is about 0-90. Calf is soft and nontender. She has medial greater than lateral joint line pain and some significant crepitation. Sensation and motor function are intact in her feet with palpable pulses on both sides. There is no palpable lymphadenopathy behind her knee. She has normal unlabored respirations. The replaced knee moves about 0-100.   Neurological: She is alert and oriented to person, place, and time.  Skin: Skin is warm and dry.  Psychiatric: She has a normal mood and affect. Her behavior is normal. Judgment and thought content normal.    Vital signs in last 24 hours:    Labs:   Estimated body mass index is 29.87 kg/m as calculated from the following:   Height as of 09/03/15: 5\' 4"  (1.626 m).   Weight as of 09/03/15: 78.9 kg (174 lb).   Imaging Review Plain radiographs demonstrate severe degenerative joint disease of the left knee(s). The overall alignment isneutral. The  bone quality appears to be good for age and reported activity level.  Assessment/Plan:  End stage primary arthritis, left knee   The patient history, physical examination, clinical judgment of the provider and imaging studies are consistent with end stage degenerative joint disease of the left knee(s) and total knee arthroplasty is deemed medically necessary. The treatment options including medical management, injection therapy arthroscopy and arthroplasty were discussed at length. The risks and benefits of total knee arthroplasty were presented and reviewed. The risks due to aseptic loosening, infection, stiffness, patella tracking problems, thromboembolic complications and other imponderables were discussed. The patient acknowledged the explanation, agreed to proceed with the plan and consent was signed. Patient is being admitted for inpatient treatment for surgery, pain control, PT, OT, prophylactic antibiotics, VTE prophylaxis, progressive ambulation and ADL's and discharge planning. The patient is planning to be discharged to skilled nursing facility

## 2015-09-13 MED ORDER — CEFAZOLIN SODIUM-DEXTROSE 2-4 GM/100ML-% IV SOLN
2.0000 g | INTRAVENOUS | Status: AC
  Administered 2015-09-14: 2 g via INTRAVENOUS
  Filled 2015-09-13: qty 100

## 2015-09-13 MED ORDER — LACTATED RINGERS IV SOLN: INTRAVENOUS | Status: DC

## 2015-09-14 ENCOUNTER — Inpatient Hospital Stay (HOSPITAL_COMMUNITY): Payer: Medicare Other | Admitting: Certified Registered Nurse Anesthetist

## 2015-09-14 ENCOUNTER — Inpatient Hospital Stay (HOSPITAL_COMMUNITY)
Admission: RE | Admit: 2015-09-14 | Discharge: 2015-09-16 | DRG: 470 | Disposition: A | Discharge status: Skilled Nursing Facility | Payer: Medicare Other | Source: Ambulatory Visit | Attending: Orthopaedic Surgery | Admitting: Orthopaedic Surgery

## 2015-09-14 ENCOUNTER — Encounter (HOSPITAL_COMMUNITY)
Admission: RE | Disposition: A | Discharge status: Skilled Nursing Facility | Payer: Self-pay | Source: Ambulatory Visit | Attending: Orthopaedic Surgery

## 2015-09-14 DIAGNOSIS — Z96652 Presence of left artificial knee joint: Secondary | ICD-10-CM | POA: Diagnosis not present

## 2015-09-14 DIAGNOSIS — Z96651 Presence of right artificial knee joint: Secondary | ICD-10-CM | POA: Diagnosis not present

## 2015-09-14 DIAGNOSIS — M179 Osteoarthritis of knee, unspecified: Secondary | ICD-10-CM | POA: Diagnosis not present

## 2015-09-14 DIAGNOSIS — M6281 Muscle weakness (generalized): Secondary | ICD-10-CM | POA: Diagnosis not present

## 2015-09-14 DIAGNOSIS — K5909 Other constipation: Secondary | ICD-10-CM | POA: Diagnosis not present

## 2015-09-14 DIAGNOSIS — E669 Obesity, unspecified: Secondary | ICD-10-CM | POA: Diagnosis present

## 2015-09-14 DIAGNOSIS — M1712 Unilateral primary osteoarthritis, left knee: Secondary | ICD-10-CM | POA: Diagnosis not present

## 2015-09-14 DIAGNOSIS — M25569 Pain in unspecified knee: Secondary | ICD-10-CM | POA: Diagnosis not present

## 2015-09-14 DIAGNOSIS — I051 Rheumatic mitral insufficiency: Secondary | ICD-10-CM | POA: Diagnosis not present

## 2015-09-14 DIAGNOSIS — Z471 Aftercare following joint replacement surgery: Secondary | ICD-10-CM | POA: Diagnosis not present

## 2015-09-14 DIAGNOSIS — I10 Essential (primary) hypertension: Secondary | ICD-10-CM | POA: Diagnosis present

## 2015-09-14 DIAGNOSIS — R2681 Unsteadiness on feet: Secondary | ICD-10-CM | POA: Diagnosis not present

## 2015-09-14 DIAGNOSIS — Z6830 Body mass index (BMI) 30.0-30.9, adult: Secondary | ICD-10-CM | POA: Diagnosis not present

## 2015-09-14 DIAGNOSIS — I4892 Unspecified atrial flutter: Secondary | ICD-10-CM | POA: Diagnosis not present

## 2015-09-14 DIAGNOSIS — K219 Gastro-esophageal reflux disease without esophagitis: Secondary | ICD-10-CM | POA: Diagnosis not present

## 2015-09-14 DIAGNOSIS — M25562 Pain in left knee: Secondary | ICD-10-CM | POA: Diagnosis present

## 2015-09-14 DIAGNOSIS — Z888 Allergy status to other drugs, medicaments and biological substances status: Secondary | ICD-10-CM

## 2015-09-14 HISTORY — PX: TOTAL KNEE ARTHROPLASTY: SHX125

## 2015-09-14 SURGERY — ARTHROPLASTY, KNEE, TOTAL
Anesthesia: Spinal | Laterality: Left

## 2015-09-14 MED ORDER — HYDROMORPHONE HCL 1 MG/ML IJ SOLN
0.5000 mg | Freq: Once | INTRAMUSCULAR | Status: AC
  Administered 2015-09-14: 0.5 mg via INTRAVENOUS

## 2015-09-14 MED ORDER — TRANEXAMIC ACID 1000 MG/10ML IV SOLN
1000.0000 mg | Freq: Once | INTRAVENOUS | Status: AC
  Administered 2015-09-14: 1000 mg via INTRAVENOUS
  Filled 2015-09-14: qty 10

## 2015-09-14 MED ORDER — ONDANSETRON HCL 4 MG PO TABS: 4.0000 mg | ORAL_TABLET | Freq: Four times a day (QID) | ORAL | Status: DC | PRN

## 2015-09-14 MED ORDER — BISACODYL 5 MG PO TBEC
10.0000 mg | DELAYED_RELEASE_TABLET | Freq: Every day | ORAL | Status: DC | PRN
  Filled 2015-09-14: qty 3

## 2015-09-14 MED ORDER — ACETAMINOPHEN 325 MG PO TABS: 650.0000 mg | ORAL_TABLET | Freq: Four times a day (QID) | ORAL | Status: DC | PRN

## 2015-09-14 MED ORDER — AMLODIPINE BESYLATE 10 MG PO TABS
10.0000 mg | ORAL_TABLET | Freq: Every day | ORAL | Status: DC
  Administered 2015-09-15 – 2015-09-16 (×2): 10 mg via ORAL
  Filled 2015-09-14 (×3): qty 1

## 2015-09-14 MED ORDER — ONDANSETRON HCL 4 MG/2ML IJ SOLN
INTRAMUSCULAR | Status: DC | PRN
  Administered 2015-09-14: 4 mg via INTRAVENOUS

## 2015-09-14 MED ORDER — PHENOL 1.4 % MT LIQD: 1.0000 | OROMUCOSAL | Status: DC | PRN

## 2015-09-14 MED ORDER — HYDROMORPHONE HCL 1 MG/ML IJ SOLN
INTRAMUSCULAR | Status: AC
  Filled 2015-09-14: qty 1

## 2015-09-14 MED ORDER — PHENYLEPHRINE HCL 10 MG/ML IJ SOLN
INTRAVENOUS | Status: DC | PRN
  Administered 2015-09-14: 25 ug/min via INTRAVENOUS

## 2015-09-14 MED ORDER — ALUM & MAG HYDROXIDE-SIMETH 200-200-20 MG/5ML PO SUSP: 30.0000 mL | ORAL | Status: DC | PRN

## 2015-09-14 MED ORDER — METHOCARBAMOL 500 MG PO TABS
500.0000 mg | ORAL_TABLET | Freq: Four times a day (QID) | ORAL | Status: DC | PRN
  Administered 2015-09-14 – 2015-09-16 (×6): 500 mg via ORAL
  Filled 2015-09-14 (×6): qty 1

## 2015-09-14 MED ORDER — DIPHENHYDRAMINE HCL 12.5 MG/5ML PO ELIX
12.5000 mg | ORAL_SOLUTION | ORAL | Status: DC | PRN
Start: 2015-09-14 — End: 2015-09-16

## 2015-09-14 MED ORDER — DOCUSATE SODIUM 100 MG PO CAPS
100.0000 mg | ORAL_CAPSULE | Freq: Two times a day (BID) | ORAL | Status: DC
  Administered 2015-09-14 – 2015-09-16 (×4): 100 mg via ORAL
  Filled 2015-09-14 (×4): qty 1

## 2015-09-14 MED ORDER — LACTATED RINGERS IV SOLN
INTRAVENOUS | Status: DC
  Administered 2015-09-14: 16:00:00 via INTRAVENOUS

## 2015-09-14 MED ORDER — GLYCOPYRROLATE 0.2 MG/ML IV SOSY
PREFILLED_SYRINGE | INTRAVENOUS | Status: AC
  Filled 2015-09-14: qty 3

## 2015-09-14 MED ORDER — PROPOFOL 500 MG/50ML IV EMUL
INTRAVENOUS | Status: DC | PRN
  Administered 2015-09-14: 25 ug/kg/min via INTRAVENOUS

## 2015-09-14 MED ORDER — ACETAMINOPHEN 650 MG RE SUPP: 650.0000 mg | Freq: Four times a day (QID) | RECTAL | Status: DC | PRN

## 2015-09-14 MED ORDER — BUPIVACAINE-EPINEPHRINE (PF) 0.5% -1:200000 IJ SOLN
INTRAMUSCULAR | Status: AC
  Filled 2015-09-14: qty 30

## 2015-09-14 MED ORDER — APIXABAN 5 MG PO TABS
5.0000 mg | ORAL_TABLET | Freq: Two times a day (BID) | ORAL | Status: DC
  Administered 2015-09-15 – 2015-09-16 (×3): 5 mg via ORAL
  Filled 2015-09-14 (×3): qty 1

## 2015-09-14 MED ORDER — PROPOFOL 1000 MG/100ML IV EMUL
INTRAVENOUS | Status: AC
  Filled 2015-09-14: qty 100

## 2015-09-14 MED ORDER — FENTANYL CITRATE (PF) 100 MCG/2ML IJ SOLN
25.0000 ug | Freq: Once | INTRAMUSCULAR | Status: AC
  Administered 2015-09-14: 25 ug via INTRAVENOUS

## 2015-09-14 MED ORDER — TRAMADOL HCL 50 MG PO TABS
50.0000 mg | ORAL_TABLET | Freq: Three times a day (TID) | ORAL | Status: DC | PRN
  Administered 2015-09-14 – 2015-09-16 (×3): 100 mg via ORAL
  Filled 2015-09-14 (×3): qty 2

## 2015-09-14 MED ORDER — BUPIVACAINE LIPOSOME 1.3 % IJ SUSP
20.0000 mL | INTRAMUSCULAR | Status: AC
  Administered 2015-09-14: 20 mL
  Filled 2015-09-14: qty 20

## 2015-09-14 MED ORDER — ONDANSETRON HCL 4 MG/2ML IJ SOLN
INTRAMUSCULAR | Status: AC
  Filled 2015-09-14: qty 2

## 2015-09-14 MED ORDER — SODIUM CHLORIDE 0.9 % IJ SOLN
INTRAMUSCULAR | Status: DC | PRN
  Administered 2015-09-14: 40 mL

## 2015-09-14 MED ORDER — FENTANYL CITRATE (PF) 100 MCG/2ML IJ SOLN
INTRAMUSCULAR | Status: AC
  Filled 2015-09-14: qty 2

## 2015-09-14 MED ORDER — FENTANYL CITRATE (PF) 100 MCG/2ML IJ SOLN
25.0000 ug | INTRAMUSCULAR | Status: AC | PRN
  Administered 2015-09-14 (×6): 25 ug via INTRAVENOUS

## 2015-09-14 MED ORDER — TIZANIDINE HCL 4 MG PO TABS
2.0000 mg | ORAL_TABLET | Freq: Three times a day (TID) | ORAL | Status: DC | PRN
  Administered 2015-09-14 – 2015-09-16 (×3): 2 mg via ORAL
  Filled 2015-09-14 (×3): qty 1

## 2015-09-14 MED ORDER — PROMETHAZINE HCL 25 MG/ML IJ SOLN: 6.2500 mg | INTRAMUSCULAR | Status: DC | PRN

## 2015-09-14 MED ORDER — FUROSEMIDE 20 MG PO TABS: 20.0000 mg | ORAL_TABLET | Freq: Every day | ORAL | Status: DC | PRN

## 2015-09-14 MED ORDER — PROPOFOL 10 MG/ML IV BOLUS
INTRAVENOUS | Status: DC | PRN
  Administered 2015-09-14: 20 mg via INTRAVENOUS
  Administered 2015-09-14: 30 mg via INTRAVENOUS
  Administered 2015-09-14 (×3): 20 mg via INTRAVENOUS

## 2015-09-14 MED ORDER — LIDOCAINE 2% (20 MG/ML) 5 ML SYRINGE
INTRAMUSCULAR | Status: AC
  Filled 2015-09-14: qty 5

## 2015-09-14 MED ORDER — TRANEXAMIC ACID 1000 MG/10ML IV SOLN
2000.0000 mg | INTRAVENOUS | Status: AC
  Administered 2015-09-14: 2000 mg via TOPICAL
  Filled 2015-09-14: qty 20

## 2015-09-14 MED ORDER — BUPIVACAINE IN DEXTROSE 0.75-8.25 % IT SOLN
INTRATHECAL | Status: DC | PRN
  Administered 2015-09-14: 1.6 mL via INTRATHECAL

## 2015-09-14 MED ORDER — GLYCOPYRROLATE 0.2 MG/ML IJ SOLN
INTRAMUSCULAR | Status: DC | PRN
  Administered 2015-09-14: 0.1 mg via INTRAVENOUS

## 2015-09-14 MED ORDER — TIZANIDINE HCL 2 MG PO CAPS: 2.0000 mg | ORAL_CAPSULE | Freq: Three times a day (TID) | ORAL | Status: DC | PRN

## 2015-09-14 MED ORDER — MIDAZOLAM HCL 2 MG/2ML IJ SOLN
INTRAMUSCULAR | Status: DC | PRN
  Administered 2015-09-14 (×2): 1 mg via INTRAVENOUS

## 2015-09-14 MED ORDER — ONDANSETRON HCL 4 MG/2ML IJ SOLN
4.0000 mg | Freq: Four times a day (QID) | INTRAMUSCULAR | Status: DC | PRN
  Administered 2015-09-15: 4 mg via INTRAVENOUS
  Filled 2015-09-14: qty 2

## 2015-09-14 MED ORDER — MIDAZOLAM HCL 2 MG/2ML IJ SOLN
INTRAMUSCULAR | Status: AC
  Filled 2015-09-14: qty 2

## 2015-09-14 MED ORDER — LACTATED RINGERS IV SOLN
INTRAVENOUS | Status: DC | PRN
Start: 2015-09-14 — End: 2015-09-14
  Administered 2015-09-14 (×2): via INTRAVENOUS

## 2015-09-14 MED ORDER — CEFAZOLIN SODIUM-DEXTROSE 2-4 GM/100ML-% IV SOLN
2.0000 g | Freq: Four times a day (QID) | INTRAVENOUS | Status: AC
  Administered 2015-09-14 (×2): 2 g via INTRAVENOUS
  Filled 2015-09-14 (×2): qty 100

## 2015-09-14 MED ORDER — SODIUM CHLORIDE 0.9 % IR SOLN
Status: DC | PRN
  Administered 2015-09-14: 3000 mL

## 2015-09-14 MED ORDER — FENTANYL CITRATE (PF) 100 MCG/2ML IJ SOLN
INTRAMUSCULAR | Status: AC
Start: 2015-09-14 — End: 2015-09-15
  Filled 2015-09-14: qty 2

## 2015-09-14 MED ORDER — METOCLOPRAMIDE HCL 5 MG PO TABS: 5.0000 mg | ORAL_TABLET | Freq: Three times a day (TID) | ORAL | Status: DC | PRN

## 2015-09-14 MED ORDER — BISACODYL 5 MG PO TBEC
5.0000 mg | DELAYED_RELEASE_TABLET | Freq: Every day | ORAL | Status: DC | PRN
  Filled 2015-09-14: qty 1

## 2015-09-14 MED ORDER — 0.9 % SODIUM CHLORIDE (POUR BTL) OPTIME
TOPICAL | Status: DC | PRN
  Administered 2015-09-14: 1000 mL

## 2015-09-14 MED ORDER — TRANEXAMIC ACID 1000 MG/10ML IV SOLN
1000.0000 mg | INTRAVENOUS | Status: AC
  Administered 2015-09-14: 1000 mg via INTRAVENOUS
  Filled 2015-09-14: qty 10

## 2015-09-14 MED ORDER — HYDROCODONE-ACETAMINOPHEN 5-325 MG PO TABS
1.0000 | ORAL_TABLET | ORAL | Status: DC | PRN
  Administered 2015-09-14 – 2015-09-16 (×11): 2 via ORAL
  Filled 2015-09-14 (×10): qty 2

## 2015-09-14 MED ORDER — HYDROMORPHONE HCL 1 MG/ML IJ SOLN
0.5000 mg | INTRAMUSCULAR | Status: DC | PRN
  Administered 2015-09-14 – 2015-09-15 (×7): 1 mg via INTRAVENOUS
  Filled 2015-09-14 (×8): qty 1

## 2015-09-14 MED ORDER — METHOCARBAMOL 1000 MG/10ML IJ SOLN
500.0000 mg | Freq: Four times a day (QID) | INTRAMUSCULAR | Status: DC | PRN
  Filled 2015-09-14: qty 5

## 2015-09-14 MED ORDER — METHOCARBAMOL 1000 MG/10ML IJ SOLN
500.0000 mg | INTRAVENOUS | Status: AC
  Administered 2015-09-14: 500 mg via INTRAVENOUS
  Filled 2015-09-14: qty 5

## 2015-09-14 MED ORDER — METOCLOPRAMIDE HCL 5 MG/ML IJ SOLN: 5.0000 mg | Freq: Three times a day (TID) | INTRAMUSCULAR | Status: DC | PRN

## 2015-09-14 MED ORDER — HYDROCODONE-ACETAMINOPHEN 5-325 MG PO TABS
ORAL_TABLET | ORAL | Status: AC
  Filled 2015-09-14: qty 2

## 2015-09-14 MED ORDER — BUPIVACAINE-EPINEPHRINE (PF) 0.5% -1:200000 IJ SOLN
INTRAMUSCULAR | Status: DC | PRN
  Administered 2015-09-14: 20 mL via PERINEURAL

## 2015-09-14 MED ORDER — PANTOPRAZOLE SODIUM 40 MG PO TBEC
40.0000 mg | DELAYED_RELEASE_TABLET | Freq: Every day | ORAL | Status: DC
  Administered 2015-09-15 – 2015-09-16 (×2): 40 mg via ORAL
  Filled 2015-09-14 (×3): qty 1

## 2015-09-14 MED ORDER — MENTHOL 3 MG MT LOZG
1.0000 | LOZENGE | OROMUCOSAL | Status: DC | PRN
  Filled 2015-09-14: qty 9

## 2015-09-14 MED ORDER — FENTANYL CITRATE (PF) 100 MCG/2ML IJ SOLN
INTRAMUSCULAR | Status: DC | PRN
  Administered 2015-09-14 (×2): 50 ug via INTRAVENOUS

## 2015-09-14 SURGICAL SUPPLY — 67 items
BAG DECANTER FOR FLEXI CONT (MISCELLANEOUS) ×2 IMPLANT
BANDAGE ACE 4X5 VEL STRL LF (GAUZE/BANDAGES/DRESSINGS) IMPLANT
BANDAGE ELASTIC 6 VELCRO ST LF (GAUZE/BANDAGES/DRESSINGS) ×2 IMPLANT
BANDAGE ESMARK 6X9 LF (GAUZE/BANDAGES/DRESSINGS) ×1 IMPLANT
BLADE SAGITTAL 25.0X1.19X90 (BLADE) ×2 IMPLANT
BLADE SAW SGTL 13.0X1.19X90.0M (BLADE) ×2 IMPLANT
BLADE SURG ROTATE 9660 (MISCELLANEOUS) IMPLANT
BNDG ELASTIC 6X10 VLCR STRL LF (GAUZE/BANDAGES/DRESSINGS) IMPLANT
BNDG ESMARK 6X9 LF (GAUZE/BANDAGES/DRESSINGS) ×2
BNDG GAUZE ELAST 4 BULKY (GAUZE/BANDAGES/DRESSINGS) ×4 IMPLANT
BOWL SMART MIX CTS (DISPOSABLE) ×2 IMPLANT
CAP KNEE TOTAL 3 SIGMA ×2 IMPLANT
CEMENT HV SMART SET (Cement) ×4 IMPLANT
CLSR STERI-STRIP ANTIMIC 1/2X4 (GAUZE/BANDAGES/DRESSINGS) ×2 IMPLANT
COVER SURGICAL LIGHT HANDLE (MISCELLANEOUS) ×2 IMPLANT
CUFF TOURNIQUET SINGLE 34IN LL (TOURNIQUET CUFF) ×2 IMPLANT
CUFF TOURNIQUET SINGLE 44IN (TOURNIQUET CUFF) IMPLANT
DECANTER SPIKE VIAL GLASS SM (MISCELLANEOUS) IMPLANT
DRAPE EXTREMITY T 121X128X90 (DRAPE) ×2 IMPLANT
DRAPE PROXIMA HALF (DRAPES) ×2 IMPLANT
DRAPE U-SHAPE 47X51 STRL (DRAPES) ×2 IMPLANT
DRSG ADAPTIC 3X8 NADH LF (GAUZE/BANDAGES/DRESSINGS) IMPLANT
DRSG EMULSION OIL 3X3 NADH (GAUZE/BANDAGES/DRESSINGS) ×2 IMPLANT
DRSG PAD ABDOMINAL 8X10 ST (GAUZE/BANDAGES/DRESSINGS) ×4 IMPLANT
DURAPREP 26ML APPLICATOR (WOUND CARE) ×4 IMPLANT
ELECT REM PT RETURN 9FT ADLT (ELECTROSURGICAL) ×2
ELECTRODE REM PT RTRN 9FT ADLT (ELECTROSURGICAL) ×1 IMPLANT
GAUZE SPONGE 4X4 12PLY STRL (GAUZE/BANDAGES/DRESSINGS) IMPLANT
GLOVE BIO SURGEON STRL SZ 6.5 (GLOVE) ×2 IMPLANT
GLOVE BIO SURGEON STRL SZ8 (GLOVE) ×4 IMPLANT
GLOVE BIOGEL PI IND STRL 6.5 (GLOVE) ×1 IMPLANT
GLOVE BIOGEL PI IND STRL 8 (GLOVE) ×2 IMPLANT
GLOVE BIOGEL PI INDICATOR 6.5 (GLOVE) ×1
GLOVE BIOGEL PI INDICATOR 8 (GLOVE) ×2
GLOVE SURG SS PI 6.0 STRL IVOR (GLOVE) ×2 IMPLANT
GOWN STRL REUS W/ TWL LRG LVL3 (GOWN DISPOSABLE) ×1 IMPLANT
GOWN STRL REUS W/ TWL XL LVL3 (GOWN DISPOSABLE) ×2 IMPLANT
GOWN STRL REUS W/TWL LRG LVL3 (GOWN DISPOSABLE) ×1
GOWN STRL REUS W/TWL XL LVL3 (GOWN DISPOSABLE) ×2
HANDPIECE INTERPULSE COAX TIP (DISPOSABLE) ×1
HOOD PEEL AWAY FACE SHEILD DIS (HOOD) ×4 IMPLANT
IMMOBILIZER KNEE 22 UNIV (SOFTGOODS) ×2 IMPLANT
KIT BASIN OR (CUSTOM PROCEDURE TRAY) ×2 IMPLANT
KIT ROOM TURNOVER OR (KITS) ×2 IMPLANT
MANIFOLD NEPTUNE II (INSTRUMENTS) ×2 IMPLANT
NEEDLE HYPO 21X1 ECLIPSE (NEEDLE) ×2 IMPLANT
NS IRRIG 1000ML POUR BTL (IV SOLUTION) ×2 IMPLANT
PACK TOTAL JOINT (CUSTOM PROCEDURE TRAY) ×2 IMPLANT
PACK UNIVERSAL I (CUSTOM PROCEDURE TRAY) IMPLANT
PAD ARMBOARD 7.5X6 YLW CONV (MISCELLANEOUS) ×4 IMPLANT
SET HNDPC FAN SPRY TIP SCT (DISPOSABLE) ×1 IMPLANT
SPONGE GAUZE 4X4 12PLY STER LF (GAUZE/BANDAGES/DRESSINGS) ×2 IMPLANT
STAPLER VISISTAT 35W (STAPLE) IMPLANT
STRIP CLOSURE SKIN 1/2X4 (GAUZE/BANDAGES/DRESSINGS) IMPLANT
SUCTION FRAZIER HANDLE 10FR (MISCELLANEOUS)
SUCTION TUBE FRAZIER 10FR DISP (MISCELLANEOUS) IMPLANT
SUT MNCRL AB 3-0 PS2 18 (SUTURE) ×2 IMPLANT
SUT VIC AB 0 CT1 27 (SUTURE) ×2
SUT VIC AB 0 CT1 27XBRD ANBCTR (SUTURE) ×2 IMPLANT
SUT VIC AB 2-0 CT1 27 (SUTURE) ×2
SUT VIC AB 2-0 CT1 TAPERPNT 27 (SUTURE) ×2 IMPLANT
SUT VLOC 180 0 24IN GS25 (SUTURE) ×2 IMPLANT
SYR 50ML LL SCALE MARK (SYRINGE) ×2 IMPLANT
TOWEL OR 17X24 6PK STRL BLUE (TOWEL DISPOSABLE) ×2 IMPLANT
TOWEL OR 17X26 10 PK STRL BLUE (TOWEL DISPOSABLE) ×2 IMPLANT
TRAY FOLEY CATH 14FR (SET/KITS/TRAYS/PACK) IMPLANT
WATER STERILE IRR 1000ML POUR (IV SOLUTION) IMPLANT

## 2015-09-14 NOTE — Evaluation (Signed)
Physical Therapy Evaluation Patient Details Name: [name] MRN: [id number] DOB: 06/24/1940 Today's Date: 09/14/2015   History of Present Illness  75 y.o. female now s/p Lt TKA. PMH: rheumatic heart disease, atrial flutter, anemia, systolic murmur, peripheral edema, Rt TKA 4/17.   Clinical Impression  Pt is s/p TKA resulting in the deficits listed below (see PT Problem List). Pt with limited activity tolerance, ambulating 4 feet with rw and min assist. Pt will benefit from skilled PT to increase their independence and safety with mobility to allow discharge to SNF for further rehabilitation following acute stay.       Follow Up Recommendations SNF;Supervision for mobility/OOB    Equipment Recommendations  None recommended by PT    Recommendations for Other Services       Precautions / Restrictions Precautions Precautions: Knee;Fall Precaution Booklet Issued: Yes (comment) Precaution Comments: HEP provided, reviewed knee extension precautions Required Braces or Orthoses: Knee Immobilizer - Left Restrictions Weight Bearing Restrictions: Yes LLE Weight Bearing: Weight bearing as tolerated      Mobility  Bed Mobility Overal bed mobility: Needs Assistance Bed Mobility: Supine to Sit     Supine to sit: Mod assist        Transfers Overall transfer level: Needs assistance Equipment used: Rolling walker (2 wheeled) Transfers: Sit to/from [employer] Sit to Stand: Mod assist Stand pivot transfers: Min assist       General transfer comment: transfers performed from bed>BSC>chair  Ambulation/Gait Ambulation/Gait assistance: Min assist Ambulation Distance (Feet): 4 Feet Assistive device: Rolling walker (2 wheeled) Gait Pattern/deviations: Step-to pattern;Decreased weight shift to left;Decreased stance time - left Gait velocity: slow pattern   General Gait Details: slow pattern, difficulty with weightbearing though LLE  Stairs             Wheelchair Mobility    Modified Rankin (Stroke Patients Only)       Balance Overall balance assessment: Needs assistance Sitting-balance support: No upper extremity supported Sitting balance-Leahy Scale: Fair     Standing balance support: Bilateral upper extremity supported Standing balance-Leahy Scale: Poor Standing balance comment: using rw                             Pertinent Vitals/Pain Pain Assessment: Faces Faces Pain Scale: Hurts whole lot Pain Location: Lt knee Pain Descriptors / Indicators: Guarding;Grimacing Pain Intervention(s): Limited activity within patient's tolerance;Monitored during session    Home Living Family/patient expects to be discharged to:: Skilled nursing facility Living Arrangements: Spouse/significant other               Additional Comments: Pt reports that her spouse is unable to assist her at home. Went to SNF following her last TKA in April.     Prior Function Level of Independence: Independent with assistive device(s)               Hand Dominance        Extremity/Trunk Assessment   Upper Extremity Assessment: Generalized weakness           Lower Extremity Assessment: LLE deficits/detail   LLE Deficits / Details: unable to perform SLR     Communication   Communication: No difficulties  Cognition Arousal/Alertness: Awake/alert Behavior During Therapy: WFL for tasks assessed/performed Overall Cognitive Status: Within Functional Limits for tasks assessed                      General Comments  Exercises        Assessment/Plan    PT Assessment Patient needs continued PT services  PT Diagnosis Difficulty walking   PT Problem List Decreased strength;Decreased range of motion;Decreased activity tolerance;Decreased balance;Decreased mobility  PT Treatment Interventions DME instruction;Gait training;Stair training;Functional mobility training;Therapeutic activities;Therapeutic  exercise;Patient/family education   PT Goals (Current goals can be found in the Care Plan section) Acute Rehab PT Goals Patient Stated Goal: walk better PT Goal Formulation: With patient Time For Goal Achievement: 09/28/15 Potential to Achieve Goals: Good    Frequency 7X/week   Barriers to discharge        Co-evaluation               End of Session Equipment Utilized During Treatment: Left knee immobilizer;Gait belt Activity Tolerance: Patient limited by fatigue;Patient limited by pain Patient left: in chair;with call bell/phone within reach;with chair alarm set Nurse Communication: Mobility status;Weight bearing status         Time: [id number] PT Time Calculation (min) (ACUTE ONLY): 31 min   Charges:   PT Evaluation $PT Eval Moderate Complexity: 1 Procedure PT Treatments $Therapeutic Activity: 8-22 mins   PT G Codes:        Christiane HaBenjamin J. Andie Mortimer, PT, CSCS Pager [phone] Office [phone]  09/14/2015, 4:29 PM

## 2015-09-14 NOTE — Transfer of Care (Signed)
Immediate Anesthesia Transfer of Care Note  [name]  Procedure(s) Performed: Procedure(s): TOTAL KNEE ARTHROPLASTY LEFT (Left)  Patient Location: PACU  Anesthesia Type:Spinal  Level of Consciousness: awake, alert , oriented and patient cooperative  Airway & Oxygen Therapy: Patient Spontanous Breathing and Patient connected to nasal cannula oxygen  Post-op Assessment: Report given to RN, Post -op Vital signs reviewed and stable, Patient moving all extremities X 4 and Patient able to stick tongue midline  Post vital signs: Reviewed and stable  Last Vitals:  Vitals:   09/14/15 0600  BP: (!) 146/78  Pulse: 67  Resp: 20  Temp: 36.8 C    Last Pain:  Vitals:   09/14/15 0600  TempSrc: Oral         Complications: No apparent anesthesia complications

## 2015-09-14 NOTE — Anesthesia Procedure Notes (Signed)
Spinal  Patient location during procedure: OR Start time: 09/14/2015 7:33 AM End time: 09/14/2015 8:39 AM Staffing Anesthesiologist: Bonita QuinGUIDETTI, Abiha Lukehart S Performed: anesthesiologist  Preanesthetic Checklist Completed: patient identified, site marked, surgical consent, pre-op evaluation, timeout performed, IV checked, risks and benefits discussed and monitors and equipment checked Spinal Block Patient position: sitting Prep: DuraPrep Patient monitoring: blood pressure, continuous pulse ox, cardiac monitor and heart rate Approach: midline Location: L4-5 Injection technique: single-shot Needle Needle type: Pencil-Tip  Needle gauge: 25 G

## 2015-09-14 NOTE — Anesthesia Postprocedure Evaluation (Signed)
Anesthesia Post Note  Patient: Abigail HeritageLizzie M Kana  Procedure(s) Performed: Procedure(s) (LRB): TOTAL KNEE ARTHROPLASTY LEFT (Left)  Patient location during evaluation: PACU Anesthesia Type: Spinal Level of consciousness: oriented and awake and alert Pain management: pain level controlled Vital Signs Assessment: post-procedure vital signs reviewed and stable Respiratory status: spontaneous breathing, respiratory function stable and patient connected to nasal cannula oxygen Cardiovascular status: blood pressure returned to baseline and stable Postop Assessment: no headache and no backache Anesthetic complications: no    Last Vitals:  Vitals:   09/14/15 1127 09/14/15 1130  BP: (!) 159/90   Pulse: 73 71  Resp: 13 17  Temp:      Last Pain:  Vitals:   09/14/15 1046  TempSrc:   PainSc: 9                  Nzinga Ferran S Kewanna Kasprzak

## 2015-09-14 NOTE — Op Note (Signed)
PREOP DIAGNOSIS: DJD LEFT KNEE POSTOP DIAGNOSIS:  same PROCEDURE: LEFT TKR ANESTHESIA: Spinal and MAC ATTENDING SURGEON: Arlayne Liggins G ASSISTANT: Elodia FlorenceAndrew Nida PA  INDICATIONS FOR PROCEDURE: Abigail HeritageLizzie M Leisinger is a 75 y.o. female who has struggled for a long time with pain due to degenerative arthritis of the left knee.  The patient has failed many conservative non-operative measures and at this point has pain which limits the ability to sleep and walk.  The patient is offered total knee replacement.  Informed operative consent was obtained after discussion of possible risks of anesthesia, infection, neurovascular injury, DVT, and death.  The importance of the post-operative rehabilitation protocol to optimize result was stressed extensively with the patient.  SUMMARY OF FINDINGS AND PROCEDURE:  [name] was taken to the operative suite where under the above anesthesia a left knee replacement was performed.  There were advanced degenerative changes and the bone quality was fair.  We used the DePuyLCS system and placed size standard plus femur, 5 tibia, 38 mm all polyethylene patella, and a size 10 mm spacer.  Elodia FlorenceAndrew Nida PA-C assisted throughout and was invaluable to the completion of the case in that he helped retract and maintain exposure while I placed the components.  He also helped close thereby minimizing OR time.  The patient was admitted for appropriate post-op care to include perioperative antibiotics and mechanical and pharmacologic measures for DVT prophylaxis.  DESCRIPTION OF PROCEDURE:  Abigail HeritageLizzie M Pelster was taken to the operative suite where the above anesthesia was applied.  The patient was positioned supine and prepped and draped in normal sterile fashion.  An appropriate time out was performed.  After the administration of kefzol pre-op antibiotic the leg was elevated and exsanguinated and a tourniquet inflated.  A standard longitudinal incision was made on the anterior knee.  Dissection  was carried down to the extensor mechanism.  All appropriate anti-infective measures were used including the pre-operative antibiotic, betadine impregnated drape, and closed hooded exhaust systems for each member of the surgical team.  A medial parapatellar incision was made in the extensor mechanism and the knee cap flipped and the knee flexed.  Some residual meniscal tissues were removed along with any remaining ACL/PCL tissue.  A guide was placed on the tibia and a flat cut was made on it's superior surface.  An intramedullary guide was placed in the femur and was utilized to make anterior and posterior cuts creating an appropriate flexion gap.  A second intramedullary guide was placed in the femur to make a distal cut properly balancing the knee with an extension gap equal to the flexion gap.  The three bones sized to the above mentioned sizes and the appropriate guides were placed and utilized.  A trial reduction was done and the knee easily came to full extension and the patella tracked well on flexion.  The trial components were removed and all bones were cleaned with pulsatile lavage and then dried thoroughly.  Cement was mixed and was pressurized onto the bones followed by placement of the aforementioned components.  Excess cement was trimmed and pressure was held on the components until the cement had hardened.  The tourniquet was deflated and a small amount of bleeding was controlled with cautery and pressure.  The knee was irrigated thoroughly.  The extensor mechanism was re-approximated with V-loc suture in running fashion.  The knee was flexed and the repair was solid.  The subcutaneous tissues were re-approximated with #0 and #2-0 vicryl and the skin  closed with a subcuticular stitch and steristrips.  A sterile dressing was applied.  Intraoperative fluids, EBL, and tourniquet time can be obtained from anesthesia records.  DISPOSITION:  The patient was taken to recovery room in stable condition and  admitted for appropriate post-op care to include peri-operative antibiotic and DVT prophylaxis with mechanical and pharmacologic measures.  Tameka Hoiland G 09/14/2015, 9:15 AM

## 2015-09-14 NOTE — Anesthesia Preprocedure Evaluation (Signed)
Anesthesia Evaluation  Patient identified by MRN, date of birth, ID band Patient awake    Reviewed: Allergy & Precautions, NPO status , Patient's Chart, lab work & pertinent test results  History of Anesthesia Complications (+) PONV  Airway Mallampati: I  TM Distance: >3 FB Neck ROM: Full    Dental no notable dental hx.    Pulmonary    Pulmonary exam normal        Cardiovascular hypertension, Pt. on medications Normal cardiovascular exam  Echo 04/2014 Study Conclusions  - Left ventricle: The cavity size was normal. Wall thickness was increased in a pattern of mild LVH. There was mild focal basal hypertrophy of the septum. Systolic function was normal. The estimated ejection fraction was in the range of 55% to 60%. Wall motion was normal; there were no regional wall motion abnormalities. - Aortic valve: There was mild stenosis. - Mitral valve: Calcified annulus. Mildly thickened leaflets . There was moderate to severe regurgitation. - Left atrium: The atrium was severely dilated. - Atrial septum: There was a patent foramen ovale. - Pulmonary arteries: Systolic pressure was moderately to severely increased. PA peak pressure: 56 mm Hg (S).  Impressions:  - Normal LV function; severe LAE; mild AS; moderate to severe MR; mild TR; moderate to severe elevation in pulmonary pressure.    Neuro/Psych    GI/Hepatic GERD  Medicated,  Endo/Other    Renal/GU      Musculoskeletal  (+) Arthritis ,   Abdominal   Peds  Hematology   Anesthesia Other Findings   Reproductive/Obstetrics                             Anesthesia Physical  Anesthesia Plan  ASA: III  Anesthesia Plan: Spinal   Post-op Pain Management:    Induction:   Airway Management Planned: Natural Airway  Additional Equipment:   Intra-op Plan:   Post-operative Plan:   Informed Consent: I have reviewed  the patients History and Physical, chart, labs and discussed the procedure including the risks, benefits and alternatives for the proposed anesthesia with the patient or authorized representative who has indicated his/her understanding and acceptance.     Plan Discussed with: CRNA and Surgeon  Anesthesia Plan Comments:         Anesthesia Quick Evaluation

## 2015-09-14 NOTE — Progress Notes (Signed)
Orthopedic Tech Progress Note Patient Details:  Erma HeritageLizzie M Peri [birth year] [id number] Viewed order from doctor's order list CPM Left Knee CPM Left Knee: On Left Knee Flexion (Degrees): 90 Left Knee Extension (Degrees): 0 Additional Comments: trapeze bar patient helper   Nikki DomCrawford, Jalina Blowers 09/14/2015, 10:16 AM

## 2015-09-14 NOTE — Interval H&P Note (Signed)
History and Physical Interval Note:  09/14/2015 7:13 AM  [name]  has presented today for surgery, with the diagnosis of LEFT KNEE DEGENERATIVE JOINT DISEASE  The various methods of treatment have been discussed with the patient and family. After consideration of risks, benefits and other options for treatment, the patient has consented to  Procedure(s): TOTAL KNEE ARTHROPLASTY (Left) as a surgical intervention .  The patient's history has been reviewed, patient examined, no change in status, stable for surgery.  I have reviewed the patient's chart and labs.  Questions were answered to the patient's satisfaction.     Demorris Choyce G

## 2015-09-15 ENCOUNTER — Encounter (HOSPITAL_COMMUNITY): Payer: Self-pay | Admitting: Orthopaedic Surgery

## 2015-09-15 NOTE — Progress Notes (Signed)
Subjective: 1 Day Post-Op Procedure(s) (LRB): TOTAL KNEE ARTHROPLASTY LEFT (Left)  Activity level:  wbat Diet tolerance:  ok Voiding:  ok Patient reports pain as mild and moderate.    Objective: Vital signs in last 24 hours: Temp:  [97.8 F (36.6 C)-98.5 F (36.9 C)] 98.1 F (36.7 C) (09/13 0515) Pulse Rate:  [51-94] 89 (09/13 0515) Resp:  [13-23] 17 (09/13 0515) BP: (115-182)/(63-108) 156/90 (09/13 0515) SpO2:  [96 %-100 %] 96 % (09/13 0515)  Labs: No results for input(s): HGB in the last 72 hours. No results for input(s): WBC, RBC, HCT, PLT in the last 72 hours. No results for input(s): NA, K, CL, CO2, BUN, CREATININE, GLUCOSE, CALCIUM in the last 72 hours. No results for input(s): LABPT, INR in the last 72 hours.  Physical Exam:  Neurologically intact ABD soft Neurovascular intact Sensation intact distally Intact pulses distally Dorsiflexion/Plantar flexion intact Incision: dressing C/D/I and no drainage No cellulitis present Compartment soft  Assessment/Plan:  1 Day Post-Op Procedure(s) (LRB): TOTAL KNEE ARTHROPLASTY LEFT (Left) Advance diet Up with therapy Discharge to SNF once cleared by PT. Probably Thursday or Friday. Continue on home Eliquis for DVT prevention. I will change dressing to aquacel tomorrow. Follow up in office 2 weeks post op.  Westen Dinino, Ginger OrganNDREW PAUL 09/15/2015, 7:37 AM

## 2015-09-15 NOTE — Clinical Social Work Note (Signed)
Clinical Social Work Assessment  Patient Details  Name: [name] MRN: [id number] Date of Birth: [birth year]  Date of referral:  09/15/15               Reason for consult:  Facility Placement                Permission sought to share information with:   (Facilities) Permission granted to share information::   (Facilities)  Name::        Agency::     Relationship::     Contact Information:     Housing/Transportation Living arrangements for the past 2 months:  Single Family Home (Patient informed SW that she lives at home with her husband in [place].) Source of Information:  Patient Patient Interpreter Needed:  None Criminal Activity/Legal Involvement Pertinent to Current Situation/Hospitalization:  No - Comment as needed Significant Relationships:  Adult Children, Spouse (Daughter/Jacueline [phone]) Lives with:  Spouse Do you feel safe going back to the place where you live?   (Patient states that she is interested in facility/[facility].) Need for family participation in patient care:  Yes (Comment) (Daughter states she is primary support for patient.)  Care giving concerns:  Patient states that prior to having surgery on her L knee Tuesday she was able to complete her ADLs independently. However, she now states that she feels as though she needs assistance. Patient states that she lives at home with her husband in [place].   Social Worker assessment / plan:  SW met with patient. Patient states that she is interested in going to rehab. Patient states that she has spoken with staff at [facility] already and that she has been accepted. SW will inform her CSW to follow up regarding this placement.   Employment status:   (Patient states that she is still currently employed.) Forensic scientist:   Education officer, environmental.) PT Recommendations:    Information / Referral to community resources:   (SNF)  Patient/Family's Response to care:  Accepting. Appropriate.  Patient/Family's  Understanding of and Emotional Response to Diagnosis, Current Treatment, and Prognosis:  Patient has no questions at this time.  Emotional Assessment Appearance:  Appears stated age Attitude/Demeanor/Rapport:   (Pleasant.) Affect (typically observed):  Accepting, Appropriate Orientation:  Oriented to Self, Oriented to Place, Oriented to  Time, Oriented to Situation Alcohol / Substance use:  Not Applicable Psych involvement (Current and /or in the community):  No (Comment)  Discharge Needs  Concerns to be addressed:  No discharge needs identified Readmission within the last 30 days:  No Current discharge risk:  None Barriers to Discharge:  No Barriers Identified   Bernita Buffy 09/15/2015, 2:16 PM

## 2015-09-15 NOTE — Evaluation (Signed)
Occupational Therapy Evaluation Patient Details Name: [name] MRN: [id number] DOB: January 21, 1940 Today's Date: 09/15/2015    History of Present Illness 75 y.o. female now s/p Lt TKA. PMH: rheumatic heart disease, atrial flutter, anemia, systolic murmur, peripheral edema, Rt TKA 4/17.    Clinical Impression   Pt reports she was independent with ADL PTA. Currently pt overall min assist for ADL and functional mobility. Began knee, safety, and ADL education with pt. Pt planning to d/c to SNF for further rehab prior to return home; agree with short term SNF placement. All further OT needs can be met at the next venue of care; will defer OT to SNF. Please re-consult if needs change. Thank you for this referral.    Follow Up Recommendations  SNF;Supervision/Assistance - 24 hour    Equipment Recommendations  Other (comment) (TBD at next venue)    Recommendations for Other Services       Precautions / Restrictions Precautions Precautions: Knee;Fall Required Braces or Orthoses: Knee Immobilizer - Left Restrictions Weight Bearing Restrictions: Yes LLE Weight Bearing: Weight bearing as tolerated      Mobility Bed Mobility Overal bed mobility: Needs Assistance Bed Mobility: Supine to Sit;Sit to Supine     Supine to sit: Min assist Sit to supine: Min assist   General bed mobility comments: Min assist for LLE. Increased time needed. HOB elevated with use of bed rail.  Transfers Overall transfer level: Needs assistance Equipment used: Rolling walker (2 wheeled) Transfers: Sit to/from Stand Sit to Stand: Min assist         General transfer comment: Min assist for sit to stand from EOB; min guard for sit to stand from [facility]. Good hand placement and technique.     Balance Overall balance assessment: Needs assistance Sitting-balance support: Feet supported;No upper extremity supported Sitting balance-Leahy Scale: Fair     Standing balance support: No upper extremity  supported;During functional activity Standing balance-Leahy Scale: Fair Standing balance comment: using rw                            ADL Overall ADL's : Needs assistance/impaired Eating/Feeding: Set up;Sitting   Grooming: Min guard;Standing;Wash/dry hands   Upper Body Bathing: Set up;Supervision/ safety;Sitting   Lower Body Bathing: Minimal assistance;Sit to/from stand   Upper Body Dressing : Set up;Supervision/safety;Sitting   Lower Body Dressing: Minimal assistance;Sit to/from stand Lower Body Dressing Details (indicate cue type and reason): Pt able to adjust sock on R foot prior to mobility. Toilet Transfer: Minimal assistance;Ambulation;BSC;RW   Toileting- Water quality scientist and Hygiene: Min guard;Sit to/from stand;Set up [place] Manipulation Details (indicate cue type and reason): for pericare only     Functional mobility during ADLs: Minimal assistance;Rolling walker General ADL Comments: Educated pt on use of KI, ice for edema and pain, safety with RW.     Vision Vision Assessment?: No apparent visual deficits   Perception     Praxis      Pertinent Vitals/Pain Pain Assessment: Faces Pain Score: 8  Faces Pain Scale: Hurts even more Pain Location: L knee Pain Descriptors / Indicators: Aching;Grimacing;Guarding Pain Intervention(s): Monitored during session;Repositioned;RN gave pain meds during session;Ice applied     Hand Dominance Right   Extremity/Trunk Assessment Upper Extremity Assessment Upper Extremity Assessment: Generalized weakness   Lower Extremity Assessment Lower Extremity Assessment: Defer to PT evaluation   Cervical / Trunk Assessment Cervical / Trunk Assessment: Kyphotic   Communication Communication Communication: No difficulties  Cognition Arousal/Alertness: Awake/alert Behavior During Therapy: WFL for tasks assessed/performed Overall Cognitive Status: Within Functional Limits for tasks assessed                      General Comments       Exercises      Shoulder Instructions      Home Living Family/patient expects to be discharged to:: Skilled nursing facility                                        Prior Functioning/Environment Level of Independence: Independent with assistive device(s)        Comments: uses quad cane, ind with ADLs, assists disabled husband.    OT Diagnosis: Generalized weakness;Acute pain   OT Problem List:     OT Treatment/Interventions:      OT Goals(Current goals can be found in the care plan section) Acute Rehab OT Goals Patient Stated Goal: walk better OT Goal Formulation: All assessment and education complete, DC therapy  OT Frequency:     Barriers to D/C:            Co-evaluation              End of Session Equipment Utilized During Treatment: Gait belt;Rolling walker;Left knee immobilizer CPM Left Knee CPM Left Knee: Off Nurse Communication: Mobility status;Patient requests pain meds  Activity Tolerance: Patient tolerated treatment well Patient left: in bed;with call bell/phone within reach;with family/visitor present;with nursing/sitter in room   Time: 1143-1206 OT Time Calculation (min): 23 min Charges:  OT General Charges $OT Visit: 1 Procedure OT Evaluation $OT Eval Moderate Complexity: 1 Procedure OT Treatments $Self Care/Home Management : 8-22 mins G-Codes:     Binnie Kand M.S., OTR/L Pager: [phone]  09/15/2015, 2:04 PM

## 2015-09-15 NOTE — Progress Notes (Signed)
Physical Therapy Treatment Patient Details Name: [name] MRN: [id number] DOB: [birth year] Today's Date: 09/15/2015    History of Present Illness 75 y.o. female now s/p Lt TKA. PMH: rheumatic heart disease, atrial flutter, anemia, systolic murmur, peripheral edema, Rt TKA 4/17.     PT Comments    Pt with improved ambulation today, 20 feet with rw and min guard assistance. Transfers require mod assist and time to complete. Continue to recommend SNF for further rehabilitation following acute stay. PT to continue to follow and progress mobility as tolerated.   Follow Up Recommendations  SNF;Supervision for mobility/OOB     Equipment Recommendations  None recommended by PT    Recommendations for Other Services       Precautions / Restrictions Precautions Precautions: Knee;Fall Required Braces or Orthoses: Knee Immobilizer - Left Restrictions Weight Bearing Restrictions: Yes LLE Weight Bearing: Weight bearing as tolerated    Mobility  Bed Mobility Overal bed mobility: Needs Assistance Bed Mobility: Supine to Sit     Supine to sit: Mod assist     General bed mobility comments: mod assist with LLE  Transfers Overall transfer level: Needs assistance Equipment used: Rolling walker (2 wheeled) Transfers: Sit to/from Stand Sit to Stand: Mod assist         General transfer comment: Pt moving slowly, taking 3 attempts to come to standing from EOB. Cues for hand placement.   Ambulation/Gait Ambulation/Gait assistance: Min guard Ambulation Distance (Feet): 20 Feet Assistive device: Rolling walker (2 wheeled) Gait Pattern/deviations: Step-to pattern Gait velocity: slow pattern   General Gait Details: slow sequence, encouraging weightbearing.    Stairs            Wheelchair Mobility    Modified Rankin (Stroke Patients Only)       Balance Overall balance assessment: Needs assistance Sitting-balance support: No upper extremity supported Sitting  balance-Leahy Scale: Fair     Standing balance support: Bilateral upper extremity supported Standing balance-Leahy Scale: Poor Standing balance comment: using rw                    Cognition Arousal/Alertness: Awake/alert Behavior During Therapy: WFL for tasks assessed/performed Overall Cognitive Status: Within Functional Limits for tasks assessed                      Exercises Total Joint Exercises Ankle Circles/Pumps: AROM;Both;10 reps Quad Sets: Strengthening;Left;10 reps Heel Slides: AAROM;Left;10 reps Straight Leg Raises: Strengthening;Left;10 reps (max assist) Goniometric ROM: knee flexion 55 degrees    General Comments        Pertinent Vitals/Pain Pain Assessment: 0-10 Pain Score: 8  Pain Location: Lt knee Pain Descriptors / Indicators: Aching Pain Intervention(s): Limited activity within patient's tolerance;Monitored during session;Ice applied    Home Living                      Prior Function            PT Goals (current goals can now be found in the care plan section) Acute Rehab PT Goals Patient Stated Goal: walk better PT Goal Formulation: With patient Time For Goal Achievement: 09/28/15 Potential to Achieve Goals: Good Progress towards PT goals: Progressing toward goals    Frequency  7X/week    PT Plan Current plan remains appropriate    Co-evaluation             End of Session Equipment Utilized During Treatment: Left knee immobilizer;Gait belt Activity Tolerance: Patient tolerated  treatment well Patient left: in chair;with call bell/phone within reach     Time: 0826-0855 PT Time Calculation (min) (ACUTE ONLY): 29 min  Charges:  $Gait Training: 8-22 mins $Therapeutic Exercise: 8-22 mins                    G Codes:      Christiane HaBenjamin J. Aleysia Oltmann, PT, CSCS Pager [phone] Office [phone]  09/15/2015, 1:28 PM

## 2015-09-15 NOTE — [place] FL2 (Signed)
[place] MEDICAID FL2 LEVEL OF CARE SCREENING TOOL     IDENTIFICATION  Patient Name: [name] Birthdate: [birth year] Sex: female Admission Date (Current Location): 09/14/2015  [facility] [place]   Facility and Address:  [facility] [place], [address], [place], [place] [address]      Provider Number: [id number]  Attending Physician Name and Address:  Marcene CorningPeter Dalldorf, MD  Relative Name and Phone Number:       Current Level of Care: Hospital Recommended Level of Care: Skilled Nursing Facility Prior Approval Number:    Date Approved/Denied:   PASRR Number:  [id number] [id number])  Discharge Plan: SNF    Current Diagnoses: Patient Active Problem List   Diagnosis Date Noted  . Primary localized osteoarthritis of left knee 09/14/2015  . Primary osteoarthritis of left knee 09/14/2015  . Lichen sclerosus of female genitalia 02/05/2015  . Chronic constipation 11/06/2014  . Atrial flutter (HCC) 05/07/2014  . Chronic rheumatic heart disease 05/05/2014  . Mitral valve regurgitation, rheumatic 05/05/2014  . Cricopharyngeal achalasia 04/08/2014  . Normocytic anemia 03/16/2014  . Sigmoid diverticulosis 03/16/2014  . Internal hemorrhoids 03/16/2014  . Obesity, Class I, BMI 30-34.9 04/26/2012  . Postmenopausal atrophic vaginitis 05/10/2011  . Healthcare maintenance 11/07/2010  . Osteoporosis, post-menopausal 10/21/2010  . Primary osteoarthritis involving multiple joints 01/12/2006  . Essential hypertension 11/20/2005    Orientation RESPIRATION BLADDER Height & Weight     Self, Time, Situation, Place  Normal Continent Weight: 174 lb (78.9 kg) Height:  5\' 4"  (162.6 cm)  BEHAVIORAL SYMPTOMS/MOOD NEUROLOGICAL BOWEL NUTRITION STATUS      Continent    AMBULATORY STATUS COMMUNICATION OF NEEDS Skin   Limited Assist ( Per Nurse, pt is a 1 assist) Verbally Normal                       Personal Care Assistance Level of Assistance  Bathing,  Dressing Bathing Assistance: Limited assistance   Dressing Assistance: Limited assistance     Functional Limitations Info             SPECIAL CARE FACTORS FREQUENCY                       Contractures      Additional Factors Info                  Current Medications (09/15/2015):  This is the current hospital active medication list Current Facility-Administered Medications  Medication Dose Route Frequency Provider Last Rate Last Dose  . acetaminophen (TYLENOL) tablet 650 mg  650 mg Oral Q6H PRN Elodia FlorenceAndrew Nida, PA-C       Or  . acetaminophen (TYLENOL) suppository 650 mg  650 mg Rectal Q6H PRN Elodia FlorenceAndrew Nida, PA-C      . alum & mag hydroxide-simeth (MAALOX/MYLANTA) 200-200-20 MG/5ML suspension 30 mL  30 mL Oral Q4H PRN Elodia FlorenceAndrew Nida, PA-C      . amLODipine (NORVASC) tablet 10 mg  10 mg Oral Daily Elodia FlorenceAndrew Nida, PA-C   10 mg at 09/15/15 0929  . apixaban (ELIQUIS) tablet 5 mg  5 mg Oral BID Elodia FlorenceAndrew Nida, PA-C   5 mg at 09/15/15 [id number]  . bisacodyl (DULCOLAX) EC tablet 10-15 mg  10-15 mg Oral Daily PRN Elodia FlorenceAndrew Nida, PA-C      . bisacodyl (DULCOLAX) EC tablet 5 mg  5 mg Oral Daily PRN Elodia FlorenceAndrew Nida, PA-C      . diphenhydrAMINE (BENADRYL) 12.5  MG/5ML elixir 12.5-25 mg  12.5-25 mg Oral Q4H PRN Elodia Florence, PA-C      . docusate sodium (COLACE) capsule 100 mg  100 mg Oral BID Elodia Florence, PA-C   100 mg at 09/15/15 1610  . furosemide (LASIX) tablet 20 mg  20 mg Oral Daily PRN Elodia Florence, PA-C      . HYDROcodone-acetaminophen (NORCO/VICODIN) 5-325 MG per tablet 1-2 tablet  1-2 tablet Oral Q4H PRN Elodia Florence, PA-C   2 tablet at 09/15/15 1201  . HYDROmorphone (DILAUDID) injection 0.5-1 mg  0.5-1 mg Intravenous Q3H PRN Elodia Florence, PA-C   1 mg at 09/15/15 0929  . lactated ringers infusion   Intravenous Continuous Elodia Florence, PA-C 50 mL/hr at 09/14/15 1622    . menthol-cetylpyridinium (CEPACOL) lozenge 3 mg  1 lozenge Oral PRN Elodia Florence, PA-C       Or  . phenol (CHLORASEPTIC) mouth spray 1  spray  1 spray Mouth/Throat PRN Elodia Florence, PA-C      . methocarbamol (ROBAXIN) tablet 500 mg  500 mg Oral Q6H PRN Elodia Florence, PA-C   500 mg at 09/15/15 1319   Or  . methocarbamol (ROBAXIN) 500 mg in dextrose 5 % 50 mL IVPB  500 mg Intravenous Q6H PRN Elodia Florence, PA-C      . metoCLOPramide (REGLAN) tablet 5-10 mg  5-10 mg Oral Q8H PRN Elodia Florence, PA-C       Or  . metoCLOPramide (REGLAN) injection 5-10 mg  5-10 mg Intravenous Q8H PRN Elodia Florence, PA-C      . ondansetron [facility]) tablet 4 mg  4 mg Oral Q6H PRN Elodia Florence, PA-C       Or  . ondansetron [facility]) injection 4 mg  4 mg Intravenous Q6H PRN Elodia Florence, PA-C   4 mg at 09/15/15 0550  . pantoprazole (PROTONIX) EC tablet 40 mg  40 mg Oral Daily Elodia Florence, PA-C   40 mg at 09/15/15 0929  . tiZANidine (ZANAFLEX) tablet 2 mg  2 mg Oral Q8H PRN Marcene Corning, MD   2 mg at 09/15/15 0521  . traMADol (ULTRAM) tablet 50-100 mg  50-100 mg Oral TID PRN Elodia Florence, PA-C   100 mg at 09/14/15 1717     Discharge Medications: Please see discharge summary for a list of discharge medications.  Relevant Imaging Results:  Relevant Lab Results:   Additional Information  (SS: [id number])  Alene Mires

## 2015-09-16 DIAGNOSIS — I10 Essential (primary) hypertension: Secondary | ICD-10-CM | POA: Diagnosis not present

## 2015-09-16 DIAGNOSIS — K219 Gastro-esophageal reflux disease without esophagitis: Secondary | ICD-10-CM | POA: Diagnosis not present

## 2015-09-16 DIAGNOSIS — M1712 Unilateral primary osteoarthritis, left knee: Secondary | ICD-10-CM | POA: Diagnosis not present

## 2015-09-16 DIAGNOSIS — R2681 Unsteadiness on feet: Secondary | ICD-10-CM | POA: Diagnosis not present

## 2015-09-16 DIAGNOSIS — D649 Anemia, unspecified: Secondary | ICD-10-CM | POA: Diagnosis not present

## 2015-09-16 DIAGNOSIS — M6281 Muscle weakness (generalized): Secondary | ICD-10-CM | POA: Diagnosis not present

## 2015-09-16 DIAGNOSIS — K5900 Constipation, unspecified: Secondary | ICD-10-CM | POA: Diagnosis not present

## 2015-09-16 DIAGNOSIS — T8454XD Infection and inflammatory reaction due to internal left knee prosthesis, subsequent encounter: Secondary | ICD-10-CM | POA: Diagnosis not present

## 2015-09-16 DIAGNOSIS — D509 Iron deficiency anemia, unspecified: Secondary | ICD-10-CM | POA: Diagnosis not present

## 2015-09-16 DIAGNOSIS — K5909 Other constipation: Secondary | ICD-10-CM | POA: Diagnosis not present

## 2015-09-16 DIAGNOSIS — R262 Difficulty in walking, not elsewhere classified: Secondary | ICD-10-CM | POA: Diagnosis not present

## 2015-09-16 DIAGNOSIS — I480 Paroxysmal atrial fibrillation: Secondary | ICD-10-CM | POA: Diagnosis not present

## 2015-09-16 DIAGNOSIS — Z96651 Presence of right artificial knee joint: Secondary | ICD-10-CM | POA: Diagnosis not present

## 2015-09-16 DIAGNOSIS — M25662 Stiffness of left knee, not elsewhere classified: Secondary | ICD-10-CM | POA: Diagnosis not present

## 2015-09-16 DIAGNOSIS — M25569 Pain in unspecified knee: Secondary | ICD-10-CM | POA: Diagnosis not present

## 2015-09-16 DIAGNOSIS — M25562 Pain in left knee: Secondary | ICD-10-CM | POA: Diagnosis not present

## 2015-09-16 DIAGNOSIS — I4892 Unspecified atrial flutter: Secondary | ICD-10-CM | POA: Diagnosis not present

## 2015-09-16 DIAGNOSIS — Z96652 Presence of left artificial knee joint: Secondary | ICD-10-CM | POA: Diagnosis not present

## 2015-09-16 DIAGNOSIS — Z471 Aftercare following joint replacement surgery: Secondary | ICD-10-CM | POA: Diagnosis not present

## 2015-09-16 MED ORDER — METHOCARBAMOL 500 MG PO TABS: 500.0000 mg | ORAL_TABLET | Freq: Four times a day (QID) | ORAL | 0 refills | Status: DC | PRN

## 2015-09-16 MED ORDER — HYDROCODONE-ACETAMINOPHEN 5-325 MG PO TABS: 1.0000 | ORAL_TABLET | ORAL | 0 refills | Status: DC | PRN

## 2015-09-16 NOTE — Clinical Social Work Placement (Signed)
   CLINICAL SOCIAL WORK PLACEMENT  NOTE  Date:  09/16/2015  Patient Details  Name: [name] MRN: [id number] Date of Birth: 06-24-1940  Clinical Social Work is seeking post-discharge placement for this patient at the Skilled  Nursing Facility level of care (*CSW will initial, date and re-position this form in  chart as items are completed):  Yes   Patient/family provided with [facility] Clinical Social Work Department's list of facilities offering this level of care within the geographic area requested by the patient (or if unable, by the patient's family).  Yes   Patient/family informed of their freedom to choose among providers that offer the needed level of care, that participate in Medicare, Medicaid or managed care program needed by the patient, have an available bed and are willing to accept the patient.  Yes   Patient/family informed of [facility]'s ownership interest in [facility] and [facility], as well as of the fact that they are under no obligation to receive care at these facilities.  PASRR submitted to EDS on 09/15/15     PASRR number received on 09/15/15     Existing PASRR number confirmed on       FL2 transmitted to all facilities in geographic area requested by pt/family on 09/15/15     FL2 transmitted to all facilities within larger geographic area on       Patient informed that his/her managed care company has contracts with or will negotiate with certain facilities, including the following:        Yes   Patient/family informed of bed offers received.  [facility]     Physician recommends and patient chooses bed at      [facility].  Patient to be transferred to facility by [facility]     Patient family notified on 09/16/15 of transfer.  Name of family member notified:  patient is alert and oriented and states will contact family      PHYSICIAN Please prepare priority discharge  summary, including medications     Additional Comment:    _______________________________________________ Rondel BatonIngle, Abigail Eckley [name], [name] 09/16/2015, 11:38 AM

## 2015-09-16 NOTE — Progress Notes (Signed)
Subjective: 2 Days Post-Op Procedure(s) (LRB): TOTAL KNEE ARTHROPLASTY LEFT (Left)  Activity level:  wbat Diet tolerance:  ok Voiding:  ok Patient reports pain as mild.    Objective: Vital signs in last 24 hours: Temp:  [98.2 F (36.8 C)-98.5 F (36.9 C)] 98.5 F (36.9 C) (09/13 2212) Pulse Rate:  [81-86] 86 (09/13 2212) Resp:  [17-18] 17 (09/13 2212) BP: (140-163)/(85-87) 159/87 (09/13 2213) SpO2:  [97 %-100 %] 97 % (09/13 2212)  Labs: No results for input(s): HGB in the last 72 hours. No results for input(s): WBC, RBC, HCT, PLT in the last 72 hours. No results for input(s): NA, K, CL, CO2, BUN, CREATININE, GLUCOSE, CALCIUM in the last 72 hours. No results for input(s): LABPT, INR in the last 72 hours.  Physical Exam:  Neurologically intact ABD soft Neurovascular intact Sensation intact distally Intact pulses distally Dorsiflexion/Plantar flexion intact Incision: dressing C/D/I and no drainage No cellulitis present Compartment soft  Assessment/Plan:  2 Days Post-Op Procedure(s) (LRB): TOTAL KNEE ARTHROPLASTY LEFT (Left) Advance diet Up with therapy Discharge to SNF today if doing well and cleared by PT. Follow up in office 2 weeks post op. Continue Eliquis for dvt prevention.  [name], Ginger OrganNDREW PAUL 09/16/2015, 8:00 AM

## 2015-09-16 NOTE — Progress Notes (Signed)
Physical Therapy Treatment Patient Details Name: [name] MRN: [id number] DOB: 12/23/1940 Today's Date: 09/16/2015    History of Present Illness 75 y.o. female now s/p Lt TKA. PMH: rheumatic heart disease, atrial flutter, anemia, systolic murmur, peripheral edema, Rt TKA 4/17.     PT Comments    Pt making gradual progress with mobility, able to ambulate 60 feet with rw and min guard assistance. Continue to recommend SNF for further rehabilitation following acute stay.   Follow Up Recommendations  SNF;Supervision for mobility/OOB     Equipment Recommendations  None recommended by PT    Recommendations for Other Services       Precautions / Restrictions Precautions Precautions: Knee;Fall Required Braces or Orthoses: Knee Immobilizer - Left Restrictions Weight Bearing Restrictions: Yes LLE Weight Bearing: Weight bearing as tolerated    Mobility  Bed Mobility Overal bed mobility: Needs Assistance Bed Mobility: Supine to Sit     Supine to sit: Min assist     General bed mobility comments: min assist provided to LLE, HOB elevated  Transfers Overall transfer level: Needs assistance Equipment used: Rolling walker (2 wheeled) Transfers: Sit to/from Stand Sit to Stand: Min assist         General transfer comment: performed from bed and bsc  Ambulation/Gait Ambulation/Gait assistance: Min guard Ambulation Distance (Feet): 60 Feet Assistive device: Rolling walker (2 wheeled) Gait Pattern/deviations: Step-through pattern;Decreased weight shift to left Gait velocity: decreased   General Gait Details: cues for even weightbearing   Stairs            Wheelchair Mobility    Modified Rankin (Stroke Patients Only)       Balance Overall balance assessment: Needs assistance Sitting-balance support: No upper extremity supported Sitting balance-Leahy Scale: Good     Standing balance support: No upper extremity supported Standing balance-Leahy Scale:  Fair Standing balance comment: able to stand static at sink to was hands                    Cognition Arousal/Alertness: Awake/alert Behavior During Therapy: WFL for tasks assessed/performed Overall Cognitive Status: Within Functional Limits for tasks assessed                      Exercises Total Joint Exercises Ankle Circles/Pumps: AROM;Both;10 reps Quad Sets: Strengthening;Left;10 reps (poor activation) Heel Slides: AAROM;Left;10 reps Straight Leg Raises: Strengthening;Left;10 reps (max assist) Goniometric ROM: Lt knee flexion 50 degrees    General Comments        Pertinent Vitals/Pain Pain Assessment: Faces Faces Pain Scale: Hurts little more Pain Location: Lt knee Pain Descriptors / Indicators: Aching Pain Intervention(s): Limited activity within patient's tolerance;Monitored during session;Ice applied    Home Living                      Prior Function            PT Goals (current goals can now be found in the care plan section) Acute Rehab PT Goals Patient Stated Goal: continue to get better PT Goal Formulation: With patient Time For Goal Achievement: 09/28/15 Potential to Achieve Goals: Good Progress towards PT goals: Progressing toward goals    Frequency  7X/week    PT Plan Current plan remains appropriate    Co-evaluation             End of Session Equipment Utilized During Treatment: Left knee immobilizer;Gait belt Activity Tolerance: Patient tolerated treatment well Patient left: in chair;with call  bell/phone within reach     Time: 0821-0852 PT Time Calculation (min) (ACUTE ONLY): 31 min  Charges:  $Gait Training: 8-22 mins $Therapeutic Exercise: 8-22 mins                    G Codes:      Christiane Ha, PT, CSCS Pager [phone] Office [phone]  09/16/2015, 8:58 AM

## 2015-09-16 NOTE — Discharge Summary (Signed)
Patient ID: [name] MRN: [id number] DOB/AGE: August 17, 1940 75 y.o.  Admit date: 09/14/2015 Discharge date: 09/16/2015  Admission Diagnoses:  Principal Problem:   Primary localized osteoarthritis of left knee Active Problems:   Primary osteoarthritis of left knee   Discharge Diagnoses:  Same  Past Medical History:  Diagnosis Date  . Anemia 03/16/2014  . Atrial flutter (HCC) 05/07/2014   With controlled ventricular rate on no AV nodal agents.  . Chronic rheumatic heart disease 05/05/2014  . Constipation    takes Dulcolax as needed  . 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 Essential hypertension    takes Amlodipine daily  . GERD (gastroesophageal reflux disease)    takes Pantoprazole daily  . History of blood transfusion    no abnormal reaction noted  . History of migraine    last one a week  . History of shingles   . Mitral valve regurgitation, rheumatic 05/05/2014   Moderate to severe with peak PA pressure of 56 mmHg on Echo 04/2014   . Muscle spasm of both lower legs    takes Zanaflex daily as needed  . Nocturia   . Osteoarthritis    knees and hands  . Peripheral edema    takes Lasix daily as needed   . PONV (postoperative nausea and vomiting)    with recent dilatation of throat, had N/V  . Systolic murmur    per patient report she has had rheumatic fever in childhood    Surgeries: Procedure(s): TOTAL KNEE ARTHROPLASTY LEFT on 09/14/2015   Consultants:   Discharged Condition: Improved  Hospital Course: [name] is an 75 y.o. female who was admitted 09/14/2015 for operative treatment ofPrimary localized osteoarthritis of left knee. Patient has severe unremitting pain that affects sleep, daily activities, and work/hobbies. After pre-op clearance the patient was taken to the operating room on 09/14/2015 and underwent  Procedure(s): TOTAL KNEE ARTHROPLASTY LEFT.     Patient was given perioperative antibiotics: Anti-infectives    Start     Dose/Rate Route Frequency Ordered Stop   09/14/15 1445  ceFAZolin (ANCEF) IVPB 2g/100 mL premix     2 g 200 mL/hr over 30 Minutes Intravenous Every 6 hours 09/14/15 1431 09/14/15 2009   09/14/15 0700  ceFAZolin (ANCEF) IVPB 2g/100 mL premix     2 g 200 mL/hr over 30 Minutes Intravenous To [facility] 09/13/15 1119 09/14/15 0738       Patient was given sequential compression devices, early ambulation, and chemoprophylaxis to prevent DVT.  Patient benefited maximally from hospital stay and there were no complications.    Recent vital signs: Patient Vitals for the past 24 hrs:  BP Temp Temp src Pulse Resp SpO2  09/15/15 2213 (!) 159/87 - - - - -  09/15/15 2212 (!) 163/87 98.5 F (36.9 C) Oral 86 17 97 %  09/15/15 1300 140/85 98.2 F (36.8 C) Oral 81 18 100 %     Recent laboratory studies: No results for input(s): WBC, HGB, HCT, PLT, NA, K, CL, CO2, BUN, CREATININE, GLUCOSE, INR, CALCIUM in the last 72 hours.  Invalid input(s): PT, 2   Discharge Medications:     Medication List    TAKE these medications   amLODipine 10 MG tablet Commonly known as:  NORVASC Take 1 tablet (10 mg total) by mouth daily.   apixaban 5 MG Tabs tablet Commonly known as:  ELIQUIS Take 1 tablet (5 mg total) by mouth  2 (two) times daily.   bisacodyl 5 MG EC tablet Commonly known as:  DULCOLAX Take 10-15 mg by mouth daily as needed for moderate constipation.   clobetasol ointment 0.05 % Commonly known as:  TEMOVATE Apply 1 application topically daily as needed (for skin irritation).   furosemide 20 MG tablet Commonly known as:  LASIX TAKE 1 TABLET(20 MG) BY MOUTH DAILY AS NEEDED FOR FLUID RETENTION   HYDROcodone-acetaminophen 5-325 MG tablet Commonly known as:  NORCO/VICODIN Take 1-2 tablets by mouth every 4 (four) hours as needed (breakthrough pain).   methocarbamol 500 MG tablet Commonly known as:   ROBAXIN Take 1 tablet (500 mg total) by mouth every 6 (six) hours as needed for muscle spasms.   pantoprazole 40 MG tablet Commonly known as:  PROTONIX Take 40 mg by mouth daily.   potassium chloride 10 MEQ tablet Commonly known as:  K-DUR Take 1 tablet (10 mEq total) by mouth every 12 (twelve) hours as needed (When lasix is given prn for fluid retention).   tizanidine 2 MG capsule Commonly known as:  ZANAFLEX Take 2 mg by mouth 3 (three) times daily as needed for muscle spasms (pain in knees).   traMADol 50 MG tablet Commonly known as:  ULTRAM Take 50-100 mg by mouth 3 (three) times daily as needed for moderate pain or severe pain.   vitamin B-12 1000 MCG tablet Commonly known as:  CYANOCOBALAMIN Take 1,000 mcg by mouth daily.       Diagnostic Studies: Mm Digital Screening Bilateral  Result Date: 08/27/2015 CLINICAL DATA:  Screening. EXAM: DIGITAL SCREENING BILATERAL MAMMOGRAM WITH CAD COMPARISON:  Previous exam(s). ACR Breast Density Category b: There are scattered areas of fibroglandular density. FINDINGS: There are no findings suspicious for malignancy. Images were processed with CAD. IMPRESSION: No mammographic evidence of malignancy. A result letter of this screening mammogram will be mailed directly to the patient. RECOMMENDATION: Screening mammogram in one year. (Code:SM-B-01Y) BI-RADS CATEGORY  1: Negative. Electronically Signed   By: Sherian Rein M.D.   On: 08/27/2015 12:09    Disposition: 03-Skilled Nursing Facility  Discharge Instructions    Call MD / Call 911    Complete by:  As directed    If you experience chest pain or shortness of breath, CALL 911 and be transported to the hospital emergency room.  If you develope a fever above 101 F, pus (white drainage) or increased drainage or redness at the wound, or calf pain, call your surgeon's office.   Constipation Prevention    Complete by:  As directed    Drink plenty of fluids.  Prune juice may be helpful.  You may  use a stool softener, such as Colace (over the counter) 100 mg twice a day.  Use MiraLax (over the counter) for constipation as needed.   Diet - low sodium heart healthy    Complete by:  As directed    Discharge instructions    Complete by:  As directed    INSTRUCTIONS AFTER JOINT REPLACEMENT   Remove items at home which could result in a fall. This includes throw rugs or furniture in walking pathways ICE to the affected joint every three hours while awake for 30 minutes at a time, for at least the first 3-5 days, and then as needed for pain and swelling.  Continue to use ice for pain and swelling. You may notice swelling that will progress down to the foot and ankle.  This is normal after surgery.  Elevate your leg  when you are not up walking on it.   Continue to use the breathing machine you got in the hospital (incentive spirometer) which will help keep your temperature down.  It is common for your temperature to cycle up and down following surgery, especially at night when you are not up moving around and exerting yourself.  The breathing machine keeps your lungs expanded and your temperature down.   DIET:  As you were doing prior to hospitalization, we recommend a well-balanced diet.  DRESSING / WOUND CARE / SHOWERING  You may shower 3 days after surgery, but keep the wounds dry during showering.  You may use an occlusive plastic wrap (Press'n Seal for example), NO SOAKING/SUBMERGING IN THE BATHTUB.  If the bandage gets wet, change with a clean dry gauze.  If the incision gets wet, pat the wound dry with a clean towel.  ACTIVITY  Increase activity slowly as tolerated, but follow the weight bearing instructions below.   No driving for 6 weeks or until further direction given by your physician.  You cannot drive while taking narcotics.  No lifting or carrying greater than 10 lbs. until further directed by your surgeon. Avoid periods of inactivity such as sitting longer than an hour when not  asleep. This helps prevent blood clots.  You may return to work once you are authorized by your doctor.     WEIGHT BEARING   Weight bearing as tolerated with assist device (walker, cane, etc) as directed, use it as long as suggested by your surgeon or therapist, typically at least 4-6 weeks.   EXERCISES  Results after joint replacement surgery are often greatly improved when you follow the exercise, range of motion and muscle strengthening exercises prescribed by your doctor. Safety measures are also important to protect the joint from further injury. Any time any of these exercises cause you to have increased pain or swelling, decrease what you are doing until you are comfortable again and then slowly increase them. If you have problems or questions, call your caregiver or physical therapist for advice.   Rehabilitation is important following a joint replacement. After just a few days of immobilization, the muscles of the leg can become weakened and shrink (atrophy).  These exercises are designed to build up the tone and strength of the thigh and leg muscles and to improve motion. Often times heat used for twenty to thirty minutes before working out will loosen up your tissues and help with improving the range of motion but do not use heat for the first two weeks following surgery (sometimes heat can increase post-operative swelling).   These exercises can be done on a training (exercise) mat, on the floor, on a table or on a bed. Use whatever works the best and is most comfortable for you.    Use music or television while you are exercising so that the exercises are a pleasant break in your day. This will make your life better with the exercises acting as a break in your routine that you can look forward to.   Perform all exercises about fifteen times, three times per day or as directed.  You should exercise both the operative leg and the other leg as well.   Exercises include:   Quad Sets -  Tighten up the muscle on the front of the thigh (Quad) and hold for 5-10 seconds.   Straight Leg Raises - With your knee straight (if you were given a brace, keep it on), lift the  leg to 60 degrees, hold for 3 seconds, and slowly lower the leg.  Perform this exercise against resistance later as your leg gets stronger.  Leg Slides: Lying on your back, slowly slide your foot toward your buttocks, bending your knee up off the floor (only go as far as is comfortable). Then slowly slide your foot back down until your leg is flat on the floor again.  Angel Wings: Lying on your back spread your legs to the side as far apart as you can without causing discomfort.  Hamstring Strength:  Lying on your back, push your heel against the floor with your leg straight by tightening up the muscles of your buttocks.  Repeat, but this time bend your knee to a comfortable angle, and push your heel against the floor.  You may put a pillow under the heel to make it more comfortable if necessary.   A rehabilitation program following joint replacement surgery can speed recovery and prevent re-injury in the future due to weakened muscles. Contact your doctor or a physical therapist for more information on knee rehabilitation.    CONSTIPATION  Constipation is defined medically as fewer than three stools per week and severe constipation as less than one stool per week.  Even if you have a regular bowel pattern at home, your normal regimen is likely to be disrupted due to multiple reasons following surgery.  Combination of anesthesia, postoperative narcotics, change in appetite and fluid intake all can affect your bowels.   YOU MUST use at least one of the following options; they are listed in order of increasing strength to get the job done.  They are all available over the counter, and you may need to use some, POSSIBLY even all of these options:    Drink plenty of fluids (prune juice may be helpful) and high fiber  foods Colace 100 mg by mouth twice a day  Senokot for constipation as directed and as needed Dulcolax (bisacodyl), take with full glass of water  Miralax (polyethylene glycol) once or twice a day as needed.  If you have tried all these things and are unable to have a bowel movement in the first 3-4 days after surgery call either your surgeon or your primary doctor.    If you experience loose stools or diarrhea, hold the medications until you stool forms back up.  If your symptoms do not get better within 1 week or if they get worse, check with your doctor.  If you experience "the worst abdominal pain ever" or develop nausea or vomiting, please contact the office immediately for further recommendations for treatment.   ITCHING:  If you experience itching with your medications, try taking only a single pain pill, or even half a pain pill at a time.  You can also use Benadryl over the counter for itching or also to help with sleep.   TED HOSE STOCKINGS:  Use stockings on both legs until for at least 2 weeks or as directed by physician office. They may be removed at night for sleeping.  MEDICATIONS:  See your medication summary on the "After Visit Summary" that nursing will review with you.  You may have some home medications which will be placed on hold until you complete the course of blood thinner medication.  It is important for you to complete the blood thinner medication as prescribed.  PRECAUTIONS:  If you experience chest pain or shortness of breath - call 911 immediately for transfer to the hospital emergency department.  If you develop a fever greater that 101 F, purulent drainage from wound, increased redness or drainage from wound, foul odor from the wound/dressing, or calf pain - CONTACT YOUR SURGEON.                                                   FOLLOW-UP APPOINTMENTS:  If you do not already have a post-op appointment, please call the office for an appointment to be seen by your  surgeon.  Guidelines for how soon to be seen are listed in your "After Visit Summary", but are typically between 1-4 weeks after surgery.  OTHER INSTRUCTIONS:   Knee Replacement:  Do not place pillow under knee, focus on keeping the knee straight while resting. CPM instructions: 0-90 degrees, 2 hours in the morning, 2 hours in the afternoon, and 2 hours in the evening. Place foam block, curve side up under heel at all times except when in CPM or when walking.  DO NOT modify, tear, cut, or change the foam block in any way.  MAKE SURE YOU:  Understand these instructions.  Get help right away if you are not doing well or get worse.    Thank you for letting us be a part of your medical care team.  It is a privilege we respect greatly.  We hope these instructions will help you stay on track for a fast and full recovery!   Increase activity slowly as tolerated    Complete by:  As directed       Follow-up Information    DALLDORF,PETER G, MD. Schedule an appointment as soon as possible for a visit in 2 weeks.   Specialty:  Orthopedic Surgery Contact information: [address] [place] [id number] [phone]            Signed: Drema HalonIDA, Oma Alpert PAUL 09/16/2015, 8:05 AM

## 2015-09-16 NOTE — Progress Notes (Signed)
Orthopedic Tech Progress Note Patient Details:  [name] 1940-05-11 [id number]  Patient ID: [name], female   DOB: 1940-05-11, 75 y.o.   MRN: [id number] Came to put pt in cpm. Pt wants to take pain meds before getting in cpm. Will call when ready.  [name], Demarques Pilz J 09/16/2015, 6:21 AM

## 2015-09-16 NOTE — Progress Notes (Signed)
Report called to Tonya at [facility]. 

## 2015-09-16 NOTE — Clinical Social Work Note (Signed)
Patient will discharge today per MD order. [facility] RN to call report prior to transportation to: [phone] Transportation: [facility] to be called at 12:15 per SNF request.  RN aware/agreeable.  CSW sent discharge summary to SNF for review.    Vickii PennaGina Deckard Stuber, MSW, LCSW  [phone]  Licensed Clinical Social Worker

## 2015-09-16 NOTE — Discharge Summary (Signed)
Patient ID: [name] MRN: [id number] DOB/AGE: 1940/06/15 75 y.o.  Admit date: 09/14/2015 Discharge date: 09/16/2015  Admission Diagnoses:  Principal Problem:   Primary localized osteoarthritis of left knee Active Problems:   Primary osteoarthritis of left knee   Discharge Diagnoses:  Same  Past Medical History:  Diagnosis Date  . Anemia 03/16/2014  . Atrial flutter (HCC) 05/07/2014   With controlled ventricular rate on no AV nodal agents.  . Chronic rheumatic heart disease 05/05/2014  . Constipation    takes Dulcolax as needed  . 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 Essential hypertension    takes Amlodipine daily  . GERD (gastroesophageal reflux disease)    takes Pantoprazole daily  . History of blood transfusion    no abnormal reaction noted  . History of migraine    last one a week  . History of shingles   . Mitral valve regurgitation, rheumatic 05/05/2014   Moderate to severe with peak PA pressure of 56 mmHg on Echo 04/2014   . Muscle spasm of both lower legs    takes Zanaflex daily as needed  . Nocturia   . Osteoarthritis    knees and hands  . Peripheral edema    takes Lasix daily as needed   . PONV (postoperative nausea and vomiting)    with recent dilatation of throat, had N/V  . Systolic murmur    per patient report she has had rheumatic fever in childhood    Surgeries: Procedure(s): TOTAL KNEE ARTHROPLASTY LEFT on 09/14/2015   Consultants:   Discharged Condition: Improved  Hospital Course: [name] is an 75 y.o. female who was admitted 09/14/2015 for operative treatment ofPrimary localized osteoarthritis of left knee. Patient has severe unremitting pain that affects sleep, daily activities, and work/hobbies. After pre-op clearance the patient was taken to the operating room on 09/14/2015 and underwent  Procedure(s): TOTAL KNEE ARTHROPLASTY LEFT.     Patient was given perioperative antibiotics: Anti-infectives    Start     Dose/Rate Route Frequency Ordered Stop   09/14/15 1445  ceFAZolin (ANCEF) IVPB 2g/100 mL premix     2 g 200 mL/hr over 30 Minutes Intravenous Every 6 hours 09/14/15 1431 09/14/15 2009   09/14/15 0700  ceFAZolin (ANCEF) IVPB 2g/100 mL premix     2 g 200 mL/hr over 30 Minutes Intravenous To [facility] 09/13/15 1119 09/14/15 0738       Patient was given sequential compression devices, early ambulation, and chemoprophylaxis to prevent DVT.  Patient benefited maximally from hospital stay and there were no complications.    Recent vital signs: Patient Vitals for the past 24 hrs:  BP Temp Temp src Pulse Resp SpO2  09/15/15 2213 (!) 159/87 - - - - -  09/15/15 2212 (!) 163/87 98.5 F (36.9 C) Oral 86 17 97 %  09/15/15 1300 140/85 98.2 F (36.8 C) Oral 81 18 100 %     Recent laboratory studies: No results for input(s): WBC, HGB, HCT, PLT, NA, K, CL, CO2, BUN, CREATININE, GLUCOSE, INR, CALCIUM in the last 72 hours.  Invalid input(s): PT, 2   Discharge Medications:     Medication List    TAKE these medications   amLODipine 10 MG tablet Commonly known as:  NORVASC Take 1 tablet (10 mg total) by mouth daily.   apixaban 5 MG Tabs tablet Commonly known as:  ELIQUIS Take 1 tablet (5 mg total) by mouth  2 (two) times daily.   bisacodyl 5 MG EC tablet Commonly known as:  DULCOLAX Take 10-15 mg by mouth daily as needed for moderate constipation.   clobetasol ointment 0.05 % Commonly known as:  TEMOVATE Apply 1 application topically daily as needed (for skin irritation).   furosemide 20 MG tablet Commonly known as:  LASIX TAKE 1 TABLET(20 MG) BY MOUTH DAILY AS NEEDED FOR FLUID RETENTION   HYDROcodone-acetaminophen 5-325 MG tablet Commonly known as:  NORCO/VICODIN Take 1-2 tablets by mouth every 4 (four) hours as needed (breakthrough pain).   methocarbamol 500 MG tablet Commonly known as:   ROBAXIN Take 1 tablet (500 mg total) by mouth every 6 (six) hours as needed for muscle spasms.   pantoprazole 40 MG tablet Commonly known as:  PROTONIX Take 40 mg by mouth daily.   potassium chloride 10 MEQ tablet Commonly known as:  K-DUR Take 1 tablet (10 mEq total) by mouth every 12 (twelve) hours as needed (When lasix is given prn for fluid retention).   tizanidine 2 MG capsule Commonly known as:  ZANAFLEX Take 2 mg by mouth 3 (three) times daily as needed for muscle spasms (pain in knees).   traMADol 50 MG tablet Commonly known as:  ULTRAM Take 50-100 mg by mouth 3 (three) times daily as needed for moderate pain or severe pain.   vitamin B-12 1000 MCG tablet Commonly known as:  CYANOCOBALAMIN Take 1,000 mcg by mouth daily.       Diagnostic Studies: Mm Digital Screening Bilateral  Result Date: 08/27/2015 CLINICAL DATA:  Screening. EXAM: DIGITAL SCREENING BILATERAL MAMMOGRAM WITH CAD COMPARISON:  Previous exam(s). ACR Breast Density Category b: There are scattered areas of fibroglandular density. FINDINGS: There are no findings suspicious for malignancy. Images were processed with CAD. IMPRESSION: No mammographic evidence of malignancy. A result letter of this screening mammogram will be mailed directly to the patient. RECOMMENDATION: Screening mammogram in one year. (Code:SM-B-01Y) BI-RADS CATEGORY  1: Negative. Electronically Signed   By: Sherian Rein M.D.   On: 08/27/2015 12:09    Disposition: 03-Skilled Nursing Facility  Discharge Instructions    Call MD / Call 911    Complete by:  As directed    If you experience chest pain or shortness of breath, CALL 911 and be transported to the hospital emergency room.  If you develope a fever above 101 F, pus (white drainage) or increased drainage or redness at the wound, or calf pain, call your surgeon's office.   Constipation Prevention    Complete by:  As directed    Drink plenty of fluids.  Prune juice may be helpful.  You may  use a stool softener, such as Colace (over the counter) 100 mg twice a day.  Use MiraLax (over the counter) for constipation as needed.   Diet - low sodium heart healthy    Complete by:  As directed    Discharge instructions    Complete by:  As directed    INSTRUCTIONS AFTER JOINT REPLACEMENT   Remove items at home which could result in a fall. This includes throw rugs or furniture in walking pathways ICE to the affected joint every three hours while awake for 30 minutes at a time, for at least the first 3-5 days, and then as needed for pain and swelling.  Continue to use ice for pain and swelling. You may notice swelling that will progress down to the foot and ankle.  This is normal after surgery.  Elevate your leg  when you are not up walking on it.   Continue to use the breathing machine you got in the hospital (incentive spirometer) which will help keep your temperature down.  It is common for your temperature to cycle up and down following surgery, especially at night when you are not up moving around and exerting yourself.  The breathing machine keeps your lungs expanded and your temperature down.   DIET:  As you were doing prior to hospitalization, we recommend a well-balanced diet.  DRESSING / WOUND CARE / SHOWERING  You may shower 3 days after surgery, but keep the wounds dry during showering.  You may use an occlusive plastic wrap (Press'n Seal for example), NO SOAKING/SUBMERGING IN THE BATHTUB.  If the bandage gets wet, change with a clean dry gauze.  If the incision gets wet, pat the wound dry with a clean towel.  ACTIVITY  Increase activity slowly as tolerated, but follow the weight bearing instructions below.   No driving for 6 weeks or until further direction given by your physician.  You cannot drive while taking narcotics.  No lifting or carrying greater than 10 lbs. until further directed by your surgeon. Avoid periods of inactivity such as sitting longer than an hour when not  asleep. This helps prevent blood clots.  You may return to work once you are authorized by your doctor.     WEIGHT BEARING   Weight bearing as tolerated with assist device (walker, cane, etc) as directed, use it as long as suggested by your surgeon or therapist, typically at least 4-6 weeks.   EXERCISES  Results after joint replacement surgery are often greatly improved when you follow the exercise, range of motion and muscle strengthening exercises prescribed by your doctor. Safety measures are also important to protect the joint from further injury. Any time any of these exercises cause you to have increased pain or swelling, decrease what you are doing until you are comfortable again and then slowly increase them. If you have problems or questions, call your caregiver or physical therapist for advice.   Rehabilitation is important following a joint replacement. After just a few days of immobilization, the muscles of the leg can become weakened and shrink (atrophy).  These exercises are designed to build up the tone and strength of the thigh and leg muscles and to improve motion. Often times heat used for twenty to thirty minutes before working out will loosen up your tissues and help with improving the range of motion but do not use heat for the first two weeks following surgery (sometimes heat can increase post-operative swelling).   These exercises can be done on a training (exercise) mat, on the floor, on a table or on a bed. Use whatever works the best and is most comfortable for you.    Use music or television while you are exercising so that the exercises are a pleasant break in your day. This will make your life better with the exercises acting as a break in your routine that you can look forward to.   Perform all exercises about fifteen times, three times per day or as directed.  You should exercise both the operative leg and the other leg as well.   Exercises include:   Quad Sets -  Tighten up the muscle on the front of the thigh (Quad) and hold for 5-10 seconds.   Straight Leg Raises - With your knee straight (if you were given a brace, keep it on), lift the  leg to 60 degrees, hold for 3 seconds, and slowly lower the leg.  Perform this exercise against resistance later as your leg gets stronger.  Leg Slides: Lying on your back, slowly slide your foot toward your buttocks, bending your knee up off the floor (only go as far as is comfortable). Then slowly slide your foot back down until your leg is flat on the floor again.  Angel Wings: Lying on your back spread your legs to the side as far apart as you can without causing discomfort.  Hamstring Strength:  Lying on your back, push your heel against the floor with your leg straight by tightening up the muscles of your buttocks.  Repeat, but this time bend your knee to a comfortable angle, and push your heel against the floor.  You may put a pillow under the heel to make it more comfortable if necessary.   A rehabilitation program following joint replacement surgery can speed recovery and prevent re-injury in the future due to weakened muscles. Contact your doctor or a physical therapist for more information on knee rehabilitation.    CONSTIPATION  Constipation is defined medically as fewer than three stools per week and severe constipation as less than one stool per week.  Even if you have a regular bowel pattern at home, your normal regimen is likely to be disrupted due to multiple reasons following surgery.  Combination of anesthesia, postoperative narcotics, change in appetite and fluid intake all can affect your bowels.   YOU MUST use at least one of the following options; they are listed in order of increasing strength to get the job done.  They are all available over the counter, and you may need to use some, POSSIBLY even all of these options:    Drink plenty of fluids (prune juice may be helpful) and high fiber  foods Colace 100 mg by mouth twice a day  Senokot for constipation as directed and as needed Dulcolax (bisacodyl), take with full glass of water  Miralax (polyethylene glycol) once or twice a day as needed.  If you have tried all these things and are unable to have a bowel movement in the first 3-4 days after surgery call either your surgeon or your primary doctor.    If you experience loose stools or diarrhea, hold the medications until you stool forms back up.  If your symptoms do not get better within 1 week or if they get worse, check with your doctor.  If you experience "the worst abdominal pain ever" or develop nausea or vomiting, please contact the office immediately for further recommendations for treatment.   ITCHING:  If you experience itching with your medications, try taking only a single pain pill, or even half a pain pill at a time.  You can also use Benadryl over the counter for itching or also to help with sleep.   TED HOSE STOCKINGS:  Use stockings on both legs until for at least 2 weeks or as directed by physician office. They may be removed at night for sleeping.  MEDICATIONS:  See your medication summary on the "After Visit Summary" that nursing will review with you.  You may have some home medications which will be placed on hold until you complete the course of blood thinner medication.  It is important for you to complete the blood thinner medication as prescribed.  PRECAUTIONS:  If you experience chest pain or shortness of breath - call 911 immediately for transfer to the hospital emergency department.  If you develop a fever greater that 101 F, purulent drainage from wound, increased redness or drainage from wound, foul odor from the wound/dressing, or calf pain - CONTACT YOUR SURGEON.                                                   FOLLOW-UP APPOINTMENTS:  If you do not already have a post-op appointment, please call the office for an appointment to be seen by your  surgeon.  Guidelines for how soon to be seen are listed in your "After Visit Summary", but are typically between 1-4 weeks after surgery.  OTHER INSTRUCTIONS:   Knee Replacement:  Do not place pillow under knee, focus on keeping the knee straight while resting. CPM instructions: 0-90 degrees, 2 hours in the morning, 2 hours in the afternoon, and 2 hours in the evening. Place foam block, curve side up under heel at all times except when in CPM or when walking.  DO NOT modify, tear, cut, or change the foam block in any way.  MAKE SURE YOU:  Understand these instructions.  Get help right away if you are not doing well or get worse.    Thank you for letting us be a part of your medical care team.  It is a privilege we respect greatly.  We hope these instructions will help you stay on track for a fast and full recovery!   Increase activity slowly as tolerated    Complete by:  As directed       Follow-up Information    DALLDORF,PETER G, MD. Schedule an appointment as soon as possible for a visit in 2 weeks.   Specialty:  Orthopedic Surgery Contact information: [address] [place] [address] [phone]            Signed: Drema Halon 09/16/2015, 8:00 AM

## 2015-09-17 ENCOUNTER — Non-Acute Institutional Stay (SKILLED_NURSING_FACILITY): Payer: Medicare Other | Admitting: Adult Health

## 2015-09-17 ENCOUNTER — Encounter: Payer: Self-pay | Admitting: Adult Health

## 2015-09-17 DIAGNOSIS — K219 Gastro-esophageal reflux disease without esophagitis: Secondary | ICD-10-CM

## 2015-09-17 DIAGNOSIS — R600 Localized edema: Secondary | ICD-10-CM

## 2015-09-17 DIAGNOSIS — I480 Paroxysmal atrial fibrillation: Secondary | ICD-10-CM | POA: Diagnosis not present

## 2015-09-17 DIAGNOSIS — R2681 Unsteadiness on feet: Secondary | ICD-10-CM

## 2015-09-17 DIAGNOSIS — K5900 Constipation, unspecified: Secondary | ICD-10-CM

## 2015-09-17 DIAGNOSIS — M1712 Unilateral primary osteoarthritis, left knee: Secondary | ICD-10-CM | POA: Diagnosis not present

## 2015-09-17 DIAGNOSIS — I10 Essential (primary) hypertension: Secondary | ICD-10-CM

## 2015-09-17 NOTE — Progress Notes (Signed)
Patient ID: [name], female   DOB: 21-Jul-1940, 75 y.o.   MRN: [id number]    DATE:  09/17/2015   MRN:  [id number]  BIRTHDAY: 1940/08/03  Facility:  Nursing Home Location:  [facility]  Nursing Home Room Number: 1107-P  LEVEL OF CARE:  SNF [phone])  Contact Information    Name Relation Home Work [place] Daughter [phone]  [phone]       Code Status History    Date Active Date Inactive Code Status Order ID Comments User Context   09/14/2015  2:31 PM 09/16/2015  4:18 PM Full Code [id number]  Pietro Cassis Inpatient   03/16/2014  4:33 AM 03/16/2014 10:08 PM Full Code [id number]  Otis Brace, MD Inpatient       Chief Complaint  Patient presents with  . Hospitalization Follow-up    HISTORY OF PRESENT ILLNESS:  This is a 75 year old female who has been admitted to [facility] on 09/16/15 from [facility] with left knee osteoarthritis for which she had Left total knee arthroplasty on 09/14/15.   She has been admitted for a short-term rehabilitation.  She is seen today in her room and complained of constipation. She verbalized that her pain is well-controlled.    PAST MEDICAL HISTORY:  Past Medical History:  Diagnosis Date  . Anemia 03/16/2014  . Atrial flutter (HCC) 05/07/2014   With controlled ventricular rate on no AV nodal agents.  . Chronic rheumatic heart disease 05/05/2014  . Constipation    takes Dulcolax as needed  . 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 Essential hypertension    takes Amlodipine daily  . GERD (gastroesophageal reflux disease)    takes Pantoprazole daily  . History of blood transfusion    no abnormal reaction noted  . History of migraine    last one a week  . History of shingles   . Mitral valve regurgitation, rheumatic 05/05/2014   Moderate to severe with peak PA pressure of 56 mmHg on Echo 04/2014    . Muscle spasm of both lower legs    takes Zanaflex daily as needed  . Nocturia   . Osteoarthritis    knees and hands  . Peripheral edema    takes Lasix daily as needed   . PONV (postoperative nausea and vomiting)    with recent dilatation of throat, had N/V  . Systolic murmur    per patient report she has had rheumatic fever in childhood     CURRENT MEDICATIONS: Reviewed  Patient's Medications  New Prescriptions   No medications on file  Previous Medications   ACETAMINOPHEN (TYLENOL) 325 MG TABLET    Take by mouth every 4 (four) hours as needed for fever (If temperature >100 orally).   AMLODIPINE (NORVASC) 10 MG TABLET    Take 1 tablet (10 mg total) by mouth daily.   APIXABAN (ELIQUIS) 5 MG TABS TABLET    Take 1 tablet (5 mg total) by mouth 2 (two) times daily.   BISACODYL (DULCOLAX) 5 MG EC TABLET    Take 10 mg by mouth daily as needed for moderate constipation.    CLOBETASOL OINTMENT (TEMOVATE) 0.05 %    Apply 1 application topically daily as needed (for skin irritation).    FUROSEMIDE (LASIX) 20 MG TABLET    TAKE 1 TABLET(20 MG) BY MOUTH DAILY AS NEEDED FOR FLUID RETENTION   HYDROCODONE-ACETAMINOPHEN (NORCO/VICODIN) 5-325 MG TABLET  Take 1-2 tablets by mouth every 4 (four) hours as needed (breakthrough pain).   PANTOPRAZOLE (PROTONIX) 40 MG TABLET    Take 40 mg by mouth daily.   POTASSIUM CHLORIDE (K-DUR) 10 MEQ TABLET    Take 1 tablet (10 mEq total) by mouth every 12 (twelve) hours as needed (When lasix is given prn for fluid retention).   TIZANIDINE (ZANAFLEX) 2 MG CAPSULE    Take 2 mg by mouth 3 (three) times daily as needed for muscle spasms (pain in knees).   TRAMADOL (ULTRAM) 50 MG TABLET    Take 50-100 mg by mouth every 8 (eight) hours.    VITAMIN B-12 (CYANOCOBALAMIN) 1000 MCG TABLET    Take 1,000 mcg by mouth daily.  Modified Medications   No medications on file  Discontinued Medications   METHOCARBAMOL (ROBAXIN) 500 MG TABLET    Take 1 tablet (500 mg total) by  mouth every 6 (six) hours as needed for muscle spasms.     Allergies  Allergen Reactions  . Ace Inhibitors Cough     REVIEW OF SYSTEMS:  GENERAL: no change in appetite, no fatigue, no weight changes, no fever, chills or weakness EYES: Denies change in vision, dry eyes, eye pain, itching or discharge EARS: Denies change in hearing, ringing in ears, or earache NOSE: Denies nasal congestion or epistaxis MOUTH and THROAT: Denies oral discomfort, gingival pain or bleeding, pain from teeth or hoarseness   RESPIRATORY: no cough, SOB, DOE, wheezing, hemoptysis CARDIAC: no chest pain, edema or palpitations GI: no abdominal pain, diarrhea, heart burn, nausea or vomiting, +constipation GU: Denies dysuria, frequency, hematuria, incontinence, or discharge PSYCHIATRIC: Denies feeling of depression or anxiety. No report of hallucinations, insomnia, paranoia, or agitation     PHYSICAL EXAMINATION  GENERAL APPEARANCE: Well nourished. In no acute distress. Normal body habitus SKIN:  Left knee surgical incision is covered with aquacel dressing, dry and no erythema HEAD: Normal in size and contour. No evidence of trauma EYES: Lids open and close normally. No blepharitis, entropion or ectropion. PERRL. Conjunctivae are clear and sclerae are white. Lenses are without opacity EARS: Pinnae are normal. Patient hears normal voice tunes of the examiner MOUTH and THROAT: Lips are without lesions. Oral mucosa is moist and without lesions. Tongue is normal in shape, size, and color and without lesions NECK: supple, trachea midline, no neck masses, no thyroid tenderness, no thyromegaly LYMPHATICS: no LAN in the neck, no supraclavicular LAN RESPIRATORY: breathing is even & unlabored, BS CTAB CARDIAC: RRR, no murmur,no extra heart sounds, no edema GI: abdomen soft, normal BS, no masses, no tenderness, no hepatomegaly, no splenomegaly EXTREMITIES:  Able to move X 4 extremities, has LLE immobilizer PSYCHIATRIC:  Alert and oriented X 3. Affect and behavior are appropriate  LABS/RADIOLOGY: Labs reviewed: Basic Metabolic Panel:  Recent Labs  82/95/62 1448  03/26/15 1550 04/12/15 09/03/15 1215  NA 137  < > 138 139 140  K 3.4*  --  3.5 4.1 3.8  CL 103  --  104  --  106  CO2 25  --  22  --  24  GLUCOSE 114*  --  105*  --  113*  BUN 12  < > 9 10 8   CREATININE 0.98*  < > 0.71 0.6 0.75  CALCIUM 9.1  --  9.4  --  9.6  < > = values in this interval not displayed. Liver Function Tests:  Recent Labs  02/16/15 1513  AST 19  ALT 14  ALKPHOS 91  BILITOT 0.5  PROT 6.9  ALBUMIN 3.7   CBC:  Recent Labs  03/26/15 1550  04/06/15 1142 04/12/15 09/03/15 1215  WBC 9.3  < > 7.1 9.9 7.7  NEUTROABS  --   --  5.0  --  5.5  HGB 14.0  --  12.3 11.2* 14.6  HCT 43.4  --  38.2 36 46.1*  MCV 85.9  --  85.7  --  88.0  PLT 244  --  205 335 250  < > = values in this interval not displayed.  Lipid Panel:  Recent Labs  02/16/15 1513  HDL 47     Mm Digital Screening Bilateral  Result Date: 08/27/2015 CLINICAL DATA:  Screening. EXAM: DIGITAL SCREENING BILATERAL MAMMOGRAM WITH CAD COMPARISON:  Previous exam(s). ACR Breast Density Category b: There are scattered areas of fibroglandular density. FINDINGS: There are no findings suspicious for malignancy. Images were processed with CAD. IMPRESSION: No mammographic evidence of malignancy. A result letter of this screening mammogram will be mailed directly to the patient. RECOMMENDATION: Screening mammogram in one year. (Code:SM-B-01Y) BI-RADS CATEGORY  1: Negative. Electronically Signed   By: Sherian ReinWei-Chen  Lin M.D.   On: 08/27/2015 12:09    ASSESSMENT/PLAN:  Unsteady gait - for rehabilitation, PT and OT, for therapeutic strengthening exercises; fall precaution  OA S/P Left total knee arthroplasty - for rehabilitation, PT and OT, for therapeutic strengthening exercises; Eliquis 5 mg 1 tab PO BID for DVT prophylaxis; Norco 5/325 mg 1-2 tabs PO Q 4 hours PRN and  Tramadol 50 mg 1-2 tabs PO TID PRN for pain; Tizanidine 2 mg 1 capsule PO TID PRN for muscle spasm; follow-up with orthopedic surgeon, Dr. Marcene CorningPeter Dalldorf, in 2 weeks  PAF - rate-controlled; continue Eliquis 5 mg 1 tab PO BID; check CBC  Hypertension - continue Amlodipine 10 mg 1 tab PO daily; check CMP  Constipation - start Bisacodyl 5 mg EC take 2 tabs = 10 mg PO Q D and Miralax 17 gm PO Q D  BLE Edema - continue Lasix 20 mg 1 tab PO Q D PRN and KCL 10 meq 1 tab PO Q 12 hours PRN when Lasix is given  GERD - continue Protonix 40 mg 1 tab PO Q D       Goals of care:  Short-term rehabilitation     Kenard GowerMonina Medina-Vargas, NP [facility] [phone]

## 2015-09-20 DIAGNOSIS — M1712 Unilateral primary osteoarthritis, left knee: Secondary | ICD-10-CM | POA: Diagnosis not present

## 2015-09-21 ENCOUNTER — Encounter: Payer: Self-pay | Admitting: *Deleted

## 2015-09-21 LAB — CBC AND DIFFERENTIAL
HEMATOCRIT: 35 % — AB (ref 36–46)
HEMOGLOBIN: 10.9 g/dL — AB (ref 12.0–16.0)
PLATELETS: 359 10*3/uL (ref 150–399)
WBC: 8 10^3/mL

## 2015-09-22 DIAGNOSIS — M25562 Pain in left knee: Secondary | ICD-10-CM | POA: Diagnosis not present

## 2015-09-22 DIAGNOSIS — R2681 Unsteadiness on feet: Secondary | ICD-10-CM | POA: Diagnosis not present

## 2015-09-22 DIAGNOSIS — M6281 Muscle weakness (generalized): Secondary | ICD-10-CM | POA: Diagnosis not present

## 2015-09-22 DIAGNOSIS — R262 Difficulty in walking, not elsewhere classified: Secondary | ICD-10-CM | POA: Diagnosis not present

## 2015-09-22 DIAGNOSIS — M25662 Stiffness of left knee, not elsewhere classified: Secondary | ICD-10-CM | POA: Diagnosis not present

## 2015-09-24 DIAGNOSIS — M6281 Muscle weakness (generalized): Secondary | ICD-10-CM | POA: Diagnosis not present

## 2015-09-24 DIAGNOSIS — R262 Difficulty in walking, not elsewhere classified: Secondary | ICD-10-CM | POA: Diagnosis not present

## 2015-09-24 DIAGNOSIS — M25562 Pain in left knee: Secondary | ICD-10-CM | POA: Diagnosis not present

## 2015-09-24 DIAGNOSIS — M25662 Stiffness of left knee, not elsewhere classified: Secondary | ICD-10-CM | POA: Diagnosis not present

## 2015-09-24 DIAGNOSIS — R2681 Unsteadiness on feet: Secondary | ICD-10-CM | POA: Diagnosis not present

## 2015-09-27 ENCOUNTER — Other Ambulatory Visit: Payer: Self-pay | Admitting: Nurse Practitioner

## 2015-09-27 DIAGNOSIS — M25662 Stiffness of left knee, not elsewhere classified: Secondary | ICD-10-CM | POA: Diagnosis not present

## 2015-09-27 DIAGNOSIS — M6281 Muscle weakness (generalized): Secondary | ICD-10-CM | POA: Diagnosis not present

## 2015-09-27 DIAGNOSIS — R262 Difficulty in walking, not elsewhere classified: Secondary | ICD-10-CM | POA: Diagnosis not present

## 2015-09-27 DIAGNOSIS — R2681 Unsteadiness on feet: Secondary | ICD-10-CM | POA: Diagnosis not present

## 2015-09-27 DIAGNOSIS — M25562 Pain in left knee: Secondary | ICD-10-CM | POA: Diagnosis not present

## 2015-09-27 MED ORDER — PANTOPRAZOLE SODIUM 40 MG PO TBEC: 40.0000 mg | DELAYED_RELEASE_TABLET | Freq: Every day | ORAL | 8 refills | Status: DC

## 2015-09-28 DIAGNOSIS — M25562 Pain in left knee: Secondary | ICD-10-CM | POA: Diagnosis not present

## 2015-09-28 DIAGNOSIS — M25662 Stiffness of left knee, not elsewhere classified: Secondary | ICD-10-CM | POA: Diagnosis not present

## 2015-09-28 DIAGNOSIS — R262 Difficulty in walking, not elsewhere classified: Secondary | ICD-10-CM | POA: Diagnosis not present

## 2015-09-28 DIAGNOSIS — R2681 Unsteadiness on feet: Secondary | ICD-10-CM | POA: Diagnosis not present

## 2015-09-28 DIAGNOSIS — M6281 Muscle weakness (generalized): Secondary | ICD-10-CM | POA: Diagnosis not present

## 2015-09-29 DIAGNOSIS — M1712 Unilateral primary osteoarthritis, left knee: Secondary | ICD-10-CM | POA: Diagnosis not present

## 2015-09-30 DIAGNOSIS — R2681 Unsteadiness on feet: Secondary | ICD-10-CM | POA: Diagnosis not present

## 2015-09-30 DIAGNOSIS — R262 Difficulty in walking, not elsewhere classified: Secondary | ICD-10-CM | POA: Diagnosis not present

## 2015-09-30 DIAGNOSIS — M25562 Pain in left knee: Secondary | ICD-10-CM | POA: Diagnosis not present

## 2015-09-30 DIAGNOSIS — M6281 Muscle weakness (generalized): Secondary | ICD-10-CM | POA: Diagnosis not present

## 2015-09-30 DIAGNOSIS — M25662 Stiffness of left knee, not elsewhere classified: Secondary | ICD-10-CM | POA: Diagnosis not present

## 2015-10-01 ENCOUNTER — Encounter: Payer: Self-pay | Admitting: Nurse Practitioner

## 2015-10-01 ENCOUNTER — Non-Acute Institutional Stay (SKILLED_NURSING_FACILITY): Payer: Medicare Other | Admitting: Nurse Practitioner

## 2015-10-01 DIAGNOSIS — D649 Anemia, unspecified: Secondary | ICD-10-CM

## 2015-10-01 DIAGNOSIS — K219 Gastro-esophageal reflux disease without esophagitis: Secondary | ICD-10-CM | POA: Diagnosis not present

## 2015-10-01 DIAGNOSIS — I10 Essential (primary) hypertension: Secondary | ICD-10-CM

## 2015-10-01 DIAGNOSIS — K5909 Other constipation: Secondary | ICD-10-CM

## 2015-10-01 DIAGNOSIS — I4892 Unspecified atrial flutter: Secondary | ICD-10-CM

## 2015-10-01 DIAGNOSIS — M1712 Unilateral primary osteoarthritis, left knee: Secondary | ICD-10-CM | POA: Diagnosis not present

## 2015-10-01 NOTE — Assessment & Plan Note (Signed)
Post op, Hgb 10.2 09/21/15, it wawas 12.3 04/06/15

## 2015-10-01 NOTE — Progress Notes (Signed)
Location:  [facility] Room Number: [phone] Place of Service:  SNF (31) Provider:  Theon Sobotka, Manxie  NP  Rocco Serene, MD  Patient Care Team: Doneen Poisson, MD as PCP - General (Internal Medicine)  Extended Emergency Contact Information Primary Emergency Contact: Della Goo of [place] Home Phone: [phone] Mobile Phone: [phone] Relation: Daughter  Code Status: Full Code  Goals of care: Advanced Directive information Advanced Directives 09/03/2015  Does patient have an advance directive? No  Would patient like information on creating an advanced directive? Yes - Lexicographer Complaint  Patient presents with  . Acute Visit    swelling in left leg up to thigh    HPI:  Pt is a 75 y.o. female seen today for an acute visit for left lower leg edema, s/p left TKA and ortho f/u, didn't think it was DVT, completed 10 day course of Keflex qid, but the patient c/o persisted swelling and pain, Robaxin q8hr prn and Norco q4h prn are not adequate.    Hx of HTN, controlled on Amlodipine 10mg , Furosemide 20mg    Past Medical History:  Diagnosis Date  . Anemia 03/16/2014  . Atrial flutter (HCC) 05/07/2014   With controlled ventricular rate on no AV nodal agents.  . Chronic rheumatic heart disease 05/05/2014  . Constipation    takes Dulcolax as needed  . 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 Essential hypertension    takes Amlodipine daily  . GERD (gastroesophageal reflux disease)    takes Pantoprazole daily  . History of blood transfusion    no abnormal reaction noted  . History of migraine    last one a week  . History of shingles   . Mitral valve regurgitation, rheumatic 05/05/2014   Moderate to severe with peak PA pressure of 56 mmHg on Echo 04/2014   . Muscle spasm of both lower legs    takes Zanaflex  daily as needed  . Nocturia   . Osteoarthritis    knees and hands  . Peripheral edema    takes Lasix daily as needed   . PONV (postoperative nausea and vomiting)    with recent dilatation of throat, had N/V  . Systolic murmur    per patient report she has had rheumatic fever in childhood   Past Surgical History:  Procedure Laterality Date  . ABDOMINAL HYSTERECTOMY     Fibroids  . COLONOSCOPY W/ BIOPSIES AND POLYPECTOMY    . CYST EXCISION Right    on hand  . ESOPHAGOSCOPY WITH DILITATION N/A 04/14/2014   Procedure: RIGID ESOPHAGOSCOPY WITH DILITATION AND BOTOX  INJECTION;  Surgeon: Osborn Coho, MD;  Location: [facility] OR;  Service: ENT;  Laterality: N/A;  . MULTIPLE TOOTH EXTRACTIONS    . TOTAL KNEE ARTHROPLASTY Right 04/06/2015  . TOTAL KNEE ARTHROPLASTY Right 04/06/2015   Procedure: RIGHT TOTAL KNEE ARTHROPLASTY;  Surgeon: Marcene Corning, MD;  Location: [facility] OR;  Service: Orthopedics;  Laterality: Right;  . TOTAL KNEE ARTHROPLASTY Left 09/14/2015   Procedure: TOTAL KNEE ARTHROPLASTY LEFT;  Surgeon: Marcene Corning, MD;  Location: [facility] OR;  Service: Orthopedics;  Laterality: Left;    Allergies  Allergen Reactions  . Ace Inhibitors Cough      Medication List       Accurate as of 10/01/15  5:13 PM. Always use your most recent med list.  acetaminophen 325 MG tablet Commonly known as:  TYLENOL Take by mouth every 4 (four) hours as needed for fever (If temperature >100 orally).   amLODipine 10 MG tablet Commonly known as:  NORVASC Take 1 tablet (10 mg total) by mouth daily.   apixaban 5 MG Tabs tablet Commonly known as:  ELIQUIS Take 1 tablet (5 mg total) by mouth 2 (two) times daily.   bisacodyl 5 MG EC tablet Commonly known as:  DULCOLAX Take 10 mg by mouth daily as needed for moderate constipation.   clobetasol ointment 0.05 % Commonly known as:  TEMOVATE Apply 1 application topically daily as needed (for skin irritation).   furosemide 20 MG tablet Commonly  known as:  LASIX TAKE 1 TABLET(20 MG) BY MOUTH DAILY AS NEEDED FOR FLUID RETENTION   HYDROcodone-acetaminophen 7.5-325 MG tablet Commonly known as:  NORCO Take 1-2 tablets by mouth every 4 (four) hours as needed for moderate pain or severe pain. Give at 9am & 5 pm  for pain. Hold for sedation.   methocarbamol 750 MG tablet Commonly known as:  ROBAXIN Take 750 mg by mouth every 8 (eight) hours as needed for muscle spasms.   pantoprazole 40 MG tablet Commonly known as:  PROTONIX Take 1 tablet (40 mg total) by mouth daily.   potassium chloride 10 MEQ tablet Commonly known as:  K-DUR Take 1 tablet (10 mEq total) by mouth every 12 (twelve) hours as needed (When lasix is given prn for fluid retention).   tizanidine 2 MG capsule Commonly known as:  ZANAFLEX Take 2 mg by mouth 3 (three) times daily as needed for muscle spasms (pain in knees).   traMADol 50 MG tablet Commonly known as:  ULTRAM Take 50-100 mg by mouth every 8 (eight) hours.   vitamin B-12 1000 MCG tablet Commonly known as:  CYANOCOBALAMIN Take 1,000 mcg by mouth daily.       Review of Systems  Constitutional: Negative for activity change, appetite change, fever and unexpected weight change.  HENT: Negative for congestion, ear discharge, ear pain, facial swelling, hearing loss, mouth sores, nosebleeds, postnasal drip and rhinorrhea.   Eyes: Negative.   Respiratory: Negative for apnea, cough, choking, chest tightness, shortness of breath, wheezing and stridor.   Cardiovascular: Positive for leg swelling. Negative for chest pain and palpitations.       Mainly in LLE  Gastrointestinal: Positive for constipation. Negative for diarrhea, nausea and vomiting.  Endocrine: Negative.   Genitourinary: Negative for difficulty urinating, dysuria, flank pain, frequency, hematuria and urgency.       No vaginal or vulvar pruitis  Musculoskeletal: Positive for arthralgias and gait problem.       Left knee pain worse than right knee  pain despite right TKA 1 month ago.  Skin: Positive for wound. Negative for color change, pallor and rash.       Left knee surgical wound is healed.   Allergic/Immunologic: Negative.   Neurological: Negative for tremors, seizures, facial asymmetry, speech difficulty, weakness, light-headedness, numbness and headaches.  Hematological: Negative.   Psychiatric/Behavioral: Negative for agitation, behavioral problems, confusion, decreased concentration, dysphoric mood, hallucinations, self-injury and suicidal ideas. The patient is not nervous/anxious and is not hyperactive.     Immunization History  Administered Date(s) Administered  . Influenza Split 10/21/2010  . Influenza,inj,Quad PF,36+ Mos 10/08/2014  . PPD Test 04/08/2015  . Pneumococcal Conjugate-13 05/22/2014  . Pneumococcal Polysaccharide-23 03/11/2012  . Tdap 03/11/2012   Pertinent  Health Maintenance Due  Topic Date Due  .  INFLUENZA VACCINE  08/03/2015  . COLONOSCOPY  02/05/2021  . DEXA SCAN  Completed  . PNA vac Low Risk Adult  Completed   Fall Risk  02/05/2015 11/06/2014 11/06/2014 05/22/2014 03/24/2014  Falls in the past year? No Yes No Yes No  Number falls in past yr: - 1 - 1 -  Injury with Fall? - No - No -  Risk for fall due to : - History of fall(s);Impaired balance/gait History of fall(s) Impaired vision -  Follow up - Education provided - Education provided -   Functional Status Survey:    Vitals:   10/01/15 1539  BP: 120/70  Pulse: 82  Resp: 18  Temp: 98.6 F (37 C)  Weight: 174 lb (78.9 kg)  Height: 5\' 4"  (1.626 m)   Body mass index is 29.87 kg/m. Physical Exam  Constitutional: She is oriented to person, place, and time. She appears well-developed and well-nourished. No distress.  HENT:  Head: Normocephalic and atraumatic.  Eyes: Conjunctivae are normal. Right eye exhibits no discharge. Left eye exhibits no discharge. No scleral icterus.  Neck: Normal range of motion. Neck supple. No JVD present. No  tracheal deviation present. No thyromegaly present.  Cardiovascular: Normal rate and regular rhythm.  Exam reveals no gallop and no friction rub.   Murmur heard. Pulmonary/Chest: Effort normal and breath sounds normal. No stridor. No respiratory distress. She has no wheezes. She has no rales.  Abdominal: Soft. Bowel sounds are normal. She exhibits no distension. There is no tenderness. There is no rebound and no guarding.  Musculoskeletal: Normal range of motion. She exhibits edema and tenderness.  Left knee pain. 2+ edema LLE  Neurological: She is alert and oriented to person, place, and time. She exhibits normal muscle tone.  Skin: Skin is warm and dry. No rash noted. She is not diaphoretic. No erythema.  Psychiatric: She has a normal mood and affect. Her behavior is normal. Judgment and thought content normal.  Nursing note and vitals reviewed.   Labs reviewed:  Recent Labs  03/08/15 1448  03/26/15 1550 04/12/15 09/03/15 1215  NA 137  < > 138 139 140  K 3.4*  --  3.5 4.1 3.8  CL 103  --  104  --  106  CO2 25  --  22  --  24  GLUCOSE 114*  --  105*  --  113*  BUN 12  < > 9 10 8   CREATININE 0.98*  < > 0.71 0.6 0.75  CALCIUM 9.1  --  9.4  --  9.6  < > = values in this interval not displayed.  Recent Labs  02/16/15 1513  AST 19  ALT 14  ALKPHOS 91  BILITOT 0.5  PROT 6.9  ALBUMIN 3.7    Recent Labs  03/26/15 1550  04/06/15 1142 04/12/15 09/03/15 1215 09/21/15  WBC 9.3  < > 7.1 9.9 7.7 8.0  NEUTROABS  --   --  5.0  --  5.5  --   HGB 14.0  --  12.3 11.2* 14.6 10.9*  HCT 43.4  --  38.2 36 46.1* 35*  MCV 85.9  --  85.7  --  88.0  --   PLT 244  --  205 335 250 359  < > = values in this interval not displayed. Lab Results  Component Value Date   TSH 2.97 02/16/2015   Lab Results  Component Value Date   HGBA1C 5.6 03/16/2014   Lab Results  Component Value Date  CHOL 157 02/16/2015   HDL 47 02/16/2015   LDLCALC 96 02/16/2015   TRIG 69 02/16/2015   CHOLHDL 3.3  02/16/2015    Significant Diagnostic Results in last 30 days:  No results found.  Assessment/Plan Primary osteoarthritis of left knee left lower leg edema, s/p left TKA and ortho f/u, didn't think it was DVT, completed 10 day course of Keflex qid, but the patient c/o persisted swelling and pain, Robaxin q8hr prn and Norco q4h prn are not adequate. Venous Doppler LLE to r/o DVT, increase Norco to q3h prn, Robaxin q6h prn for better management, may apply TED thigh high med wt if DVT is ruled out.     Essential hypertension Controlled, continue Amlodipine 10mg , Furosemide 20mg  daily.   Atrial flutter Heart rate is in control  Chronic constipation Stable, continue Ducolax 10mg  prn  Normocytic anemia Post op, Hgb 10.2 09/21/15, it wawas 12.3 04/06/15  GERD (gastroesophageal reflux disease) Stable, continue PPI     Family/ staff Communication: continue SNF, [place] when able.   Labs/tests ordered:  Venous doppler LLE

## 2015-10-01 NOTE — Assessment & Plan Note (Signed)
Stable, continue PPI

## 2015-10-01 NOTE — Assessment & Plan Note (Signed)
Controlled, continue Amlodipine 10mg , Furosemide 20mg  daily.

## 2015-10-01 NOTE — Assessment & Plan Note (Signed)
Heart rate is in control.  

## 2015-10-01 NOTE — Assessment & Plan Note (Signed)
left lower leg edema, s/p left TKA and ortho f/u, didn't think it was DVT, completed 10 day course of Keflex qid, but the patient c/o persisted swelling and pain, Robaxin q8hr prn and Norco q4h prn are not adequate. Venous Doppler LLE to r/o DVT, increase Norco to q3h prn, Robaxin q6h prn for better management, may apply TED thigh high med wt if DVT is ruled out.

## 2015-10-01 NOTE — Assessment & Plan Note (Signed)
Stable, continue Ducolax 10mg  prn

## 2015-10-04 DIAGNOSIS — M25562 Pain in left knee: Secondary | ICD-10-CM | POA: Diagnosis not present

## 2015-10-04 DIAGNOSIS — R262 Difficulty in walking, not elsewhere classified: Secondary | ICD-10-CM | POA: Diagnosis not present

## 2015-10-04 DIAGNOSIS — M6281 Muscle weakness (generalized): Secondary | ICD-10-CM | POA: Diagnosis not present

## 2015-10-04 DIAGNOSIS — R2681 Unsteadiness on feet: Secondary | ICD-10-CM | POA: Diagnosis not present

## 2015-10-04 DIAGNOSIS — M25662 Stiffness of left knee, not elsewhere classified: Secondary | ICD-10-CM | POA: Diagnosis not present

## 2015-10-05 ENCOUNTER — Encounter: Payer: Self-pay | Admitting: Adult Health

## 2015-10-05 ENCOUNTER — Non-Acute Institutional Stay (SKILLED_NURSING_FACILITY): Payer: Medicare Other | Admitting: Adult Health

## 2015-10-05 DIAGNOSIS — R2681 Unsteadiness on feet: Secondary | ICD-10-CM

## 2015-10-05 DIAGNOSIS — D62 Acute posthemorrhagic anemia: Secondary | ICD-10-CM | POA: Diagnosis not present

## 2015-10-05 DIAGNOSIS — M1712 Unilateral primary osteoarthritis, left knee: Secondary | ICD-10-CM | POA: Diagnosis not present

## 2015-10-05 DIAGNOSIS — K5909 Other constipation: Secondary | ICD-10-CM

## 2015-10-05 DIAGNOSIS — K219 Gastro-esophageal reflux disease without esophagitis: Secondary | ICD-10-CM

## 2015-10-05 DIAGNOSIS — I480 Paroxysmal atrial fibrillation: Secondary | ICD-10-CM

## 2015-10-05 DIAGNOSIS — I10 Essential (primary) hypertension: Secondary | ICD-10-CM

## 2015-10-05 DIAGNOSIS — R600 Localized edema: Secondary | ICD-10-CM | POA: Diagnosis not present

## 2015-10-05 NOTE — Progress Notes (Addendum)
Patient ID: [name], female   DOB: March 17, 1940, 75 y.o.   MRN: [id number]     DATE:  10/05/2015   MRN:  [id number]  BIRTHDAY: 05/29/1940  Facility:  Nursing Home Location:  [facility]  Nursing Home Room Number: [id number]  LEVEL OF CARE:  SNF [phone])  Contact Information    Name Relation Home Work [place] Daughter [phone]  [phone]       Code Status History    Date Active Date Inactive Code Status Order ID Comments User Context   09/14/2015  2:31 PM 09/16/2015  4:18 PM Full Code [id number]  Pietro Cassis Inpatient   03/16/2014  4:33 AM 03/16/2014 10:08 PM Full Code [id number]  Otis Brace, MD Inpatient       Chief Complaint  Patient presents with  . Discharge Note    HISTORY OF PRESENT ILLNESS:  This is a 75 year old female who is for discharge home with Home health PT, OT, CNA and Nursing.  He has been admitted to [facility] on 09/16/15 from [facility] with left knee osteoarthritis for which she had Left total knee arthroplasty on 09/14/15. LLE has 2+ edema. LLE doppler ultrasound was negative for DVT.  Patient was admitted to this facility for short-term rehabilitation after the patient's recent hospitalization.  Patient has completed SNF rehabilitation and therapy has cleared the patient for discharge.   PAST MEDICAL HISTORY:  Past Medical History:  Diagnosis Date  . Anemia 03/16/2014  . Atrial flutter (HCC) 05/07/2014   With controlled ventricular rate on no AV nodal agents.  . Chronic rheumatic heart disease 05/05/2014  . Constipation    takes Dulcolax as needed  . 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 Essential hypertension    takes Amlodipine daily  . GERD (gastroesophageal reflux disease)    takes Pantoprazole daily  . History of blood transfusion    no abnormal reaction noted  . History of migraine    last  one a week  . History of shingles   . Mitral valve regurgitation, rheumatic 05/05/2014   Moderate to severe with peak PA pressure of 56 mmHg on Echo 04/2014   . Muscle spasm of both lower legs    takes Zanaflex daily as needed  . Nocturia   . Osteoarthritis    knees and hands  . Peripheral edema    takes Lasix daily as needed   . PONV (postoperative nausea and vomiting)    with recent dilatation of throat, had N/V  . Systolic murmur    per patient report she has had rheumatic fever in childhood     CURRENT MEDICATIONS: Reviewed  Patient's Medications  New Prescriptions   No medications on file  Previous Medications   AMLODIPINE (NORVASC) 10 MG TABLET    Take 1 tablet (10 mg total) by mouth daily.   APIXABAN (ELIQUIS) 5 MG TABS TABLET    Take 1 tablet (5 mg total) by mouth 2 (two) times daily.   BISACODYL (DULCOLAX) 5 MG EC TABLET    Take 10 mg by mouth daily as needed for moderate constipation.    CLOBETASOL OINTMENT (TEMOVATE) 0.05 %    Apply 1 application topically daily as needed (for skin irritation).    FUROSEMIDE (LASIX) 20 MG TABLET    TAKE 1 TABLET(20 MG) BY MOUTH DAILY AS NEEDED FOR FLUID RETENTION   HYDROCODONE-ACETAMINOPHEN (NORCO) 7.5-325 MG TABLET  Take 1 tablet by mouth every 4 (four) hours as needed for moderate pain or severe pain. Give at 9am & 5 pm  for pain. Hold for sedation.    METHOCARBAMOL (ROBAXIN) 750 MG TABLET    Take 750 mg by mouth every 6 (six) hours as needed for muscle spasms.    PANTOPRAZOLE (PROTONIX) 40 MG TABLET    Take 1 tablet (40 mg total) by mouth daily.   POLYETHYLENE GLYCOL (MIRALAX / GLYCOLAX) PACKET    Take 17 g by mouth daily.   POTASSIUM CHLORIDE (K-DUR) 10 MEQ TABLET    Take 1 tablet (10 mEq total) by mouth every 12 (twelve) hours as needed (When lasix is given prn for fluid retention).   TRAMADOL (ULTRAM) 50 MG TABLET    Take 50-100 mg by mouth every 8 (eight) hours.    VITAMIN B-12 (CYANOCOBALAMIN) 1000 MCG TABLET    Take 1,000 mcg by  mouth daily.  Modified Medications   No medications on file  Discontinued Medications   ACETAMINOPHEN (TYLENOL) 325 MG TABLET    Take by mouth every 4 (four) hours as needed for fever (If temperature >100 orally).   TIZANIDINE (ZANAFLEX) 2 MG CAPSULE    Take 2 mg by mouth 3 (three) times daily as needed for muscle spasms (pain in knees).     Allergies  Allergen Reactions  . Ace Inhibitors Cough     REVIEW OF SYSTEMS:  GENERAL: no change in appetite, no fatigue, no weight changes, no fever, chills or weakness EYES: Denies change in vision, dry eyes, eye pain, itching or discharge EARS: Denies change in hearing, ringing in ears, or earache NOSE: Denies nasal congestion or epistaxis MOUTH and THROAT: Denies oral discomfort, gingival pain or bleeding, pain from teeth or hoarseness   RESPIRATORY: no cough, SOB, DOE, wheezing, hemoptysis CARDIAC: no chest pain, edema or palpitations GI: no abdominal pain, diarrhea, heart burn, nausea or vomiting GU: Denies dysuria, frequency, hematuria, incontinence, or discharge PSYCHIATRIC: Denies feeling of depression or anxiety. No report of hallucinations, insomnia, paranoia, or agitation     PHYSICAL EXAMINATION  GENERAL APPEARANCE: Well nourished. In no acute distress. Normal body habitus SKIN:  Left knee surgical incision has steri-strips and dry dressing, dry and no erythema HEAD: Normal in size and contour. No evidence of trauma EYES: Lids open and close normally. No blepharitis, entropion or ectropion. PERRL. Conjunctivae are clear and sclerae are white. Lenses are without opacity EARS: Pinnae are normal. Patient hears normal voice tunes of the examiner MOUTH and THROAT: Lips are without lesions. Oral mucosa is moist and without lesions. Tongue is normal in shape, size, and color and without lesions NECK: supple, trachea midline, no neck masses, no thyroid tenderness, no thyromegaly LYMPHATICS: no LAN in the neck, no supraclavicular  LAN RESPIRATORY: breathing is even & unlabored, BS CTAB CARDIAC: RRR, no murmur,no extra heart sounds, LLE 2+edema GI: abdomen soft, normal BS, no masses, no tenderness, no hepatomegaly, no splenomegaly EXTREMITIES:  Able to move X 4 extremities, has LLE immobilizer PSYCHIATRIC: Alert and oriented X 3. Affect and behavior are appropriate  LABS/RADIOLOGY: Labs reviewed: Basic Metabolic Panel:  Recent Labs  82/95/6201/06/18 1448  03/26/15 1550 04/12/15 09/03/15 1215  NA 137  < > 138 139 140  K 3.4*  --  3.5 4.1 3.8  CL 103  --  104  --  106  CO2 25  --  22  --  24  GLUCOSE 114*  --  105*  --  113*  BUN 12  < > 9 10 8   CREATININE 0.98*  < > 0.71 0.6 0.75  CALCIUM 9.1  --  9.4  --  9.6  < > = values in this interval not displayed. Liver Function Tests:  Recent Labs  02/16/15 1513  AST 19  ALT 14  ALKPHOS 91  BILITOT 0.5  PROT 6.9  ALBUMIN 3.7   CBC:  Recent Labs  03/26/15 1550  04/06/15 1142 04/12/15 09/03/15 1215 09/21/15  WBC 9.3  < > 7.1 9.9 7.7 8.0  NEUTROABS  --   --  5.0  --  5.5  --   HGB 14.0  --  12.3 11.2* 14.6 10.9*  HCT 43.4  --  38.2 36 46.1* 35*  MCV 85.9  --  85.7  --  88.0  --   PLT 244  --  205 335 250 359  < > = values in this interval not displayed.  Lipid Panel:  Recent Labs  02/16/15 1513  HDL 47      ASSESSMENT/PLAN:  Unsteady gait - for Home health CNA, Nursing PT and OT, for therapeutic strengthening exercises; fall precaution  OA S/P Left total knee arthroplasty - for Home health CNA, Nursing PT and OT, for therapeutic strengthening exercises ; Eliquis 5 mg 1 tab PO BID for DVT prophylaxis; change Norco 7.5/325 mg 1 tabs PO Q 4 hours PRN and Tramadol 50 mg 1-2 tabs PO TID PRN for pain; Robaxin 750 mg 1 tab PO Q 6 hours PRN for muscle spasm; follow-up with orthopedic surgeon, Dr. Marcene Corning  PAF - rate-controlled; continue Eliquis 5 mg 1 tab PO BID  Hypertension - continue Amlodipine 10 mg 1 tab PO daily  Constipation - continue  Bisacodyl 5 mg EC take 2 tabs = 10 mg PO Q D and Miralax 17 gm PO Q D  BLE Edema - continue Lasix 20 mg 1 tab PO Q D PRN and KCL 10 meq 1 tab PO Q 12 hours PRN when Lasix is given  GERD - continue Protonix 40 mg 1 tab PO Q D  Anemia, acute blood loss - stable Lab Results  Component Value Date   HGB 10.9 (A) 09/21/2015       I have filled out patient's discharge paperwork and written prescriptions.  Patient will receive home health PT, OT, Nursing and CNA.  DME provided:  None  Total discharge time: Greater than 30 minutes Greater than 50% was spent in counseling and coordination of care with the patient.   Discharge time involved coordination of the discharge process with social worker, nursing staff and therapy department. Medical justification for home health services verified.    Kenard Gower, NP [employer] [phone]

## 2015-10-12 DIAGNOSIS — Z96652 Presence of left artificial knee joint: Secondary | ICD-10-CM | POA: Diagnosis not present

## 2015-10-12 DIAGNOSIS — Z79891 Long term (current) use of opiate analgesic: Secondary | ICD-10-CM | POA: Diagnosis not present

## 2015-10-12 DIAGNOSIS — Z7901 Long term (current) use of anticoagulants: Secondary | ICD-10-CM | POA: Diagnosis not present

## 2015-10-12 DIAGNOSIS — K219 Gastro-esophageal reflux disease without esophagitis: Secondary | ICD-10-CM | POA: Diagnosis not present

## 2015-10-12 DIAGNOSIS — I051 Rheumatic mitral insufficiency: Secondary | ICD-10-CM | POA: Diagnosis not present

## 2015-10-12 DIAGNOSIS — M19042 Primary osteoarthritis, left hand: Secondary | ICD-10-CM | POA: Diagnosis not present

## 2015-10-12 DIAGNOSIS — M1711 Unilateral primary osteoarthritis, right knee: Secondary | ICD-10-CM | POA: Diagnosis not present

## 2015-10-12 DIAGNOSIS — I10 Essential (primary) hypertension: Secondary | ICD-10-CM | POA: Diagnosis not present

## 2015-10-12 DIAGNOSIS — Z471 Aftercare following joint replacement surgery: Secondary | ICD-10-CM | POA: Diagnosis not present

## 2015-10-12 DIAGNOSIS — M19041 Primary osteoarthritis, right hand: Secondary | ICD-10-CM | POA: Diagnosis not present

## 2015-10-13 DIAGNOSIS — M1712 Unilateral primary osteoarthritis, left knee: Secondary | ICD-10-CM | POA: Diagnosis not present

## 2015-10-14 DIAGNOSIS — I10 Essential (primary) hypertension: Secondary | ICD-10-CM | POA: Diagnosis not present

## 2015-10-14 DIAGNOSIS — M1711 Unilateral primary osteoarthritis, right knee: Secondary | ICD-10-CM | POA: Diagnosis not present

## 2015-10-14 DIAGNOSIS — K219 Gastro-esophageal reflux disease without esophagitis: Secondary | ICD-10-CM | POA: Diagnosis not present

## 2015-10-14 DIAGNOSIS — M19041 Primary osteoarthritis, right hand: Secondary | ICD-10-CM | POA: Diagnosis not present

## 2015-10-14 DIAGNOSIS — M19042 Primary osteoarthritis, left hand: Secondary | ICD-10-CM | POA: Diagnosis not present

## 2015-10-14 DIAGNOSIS — Z7901 Long term (current) use of anticoagulants: Secondary | ICD-10-CM | POA: Diagnosis not present

## 2015-10-14 DIAGNOSIS — Z79891 Long term (current) use of opiate analgesic: Secondary | ICD-10-CM | POA: Diagnosis not present

## 2015-10-14 DIAGNOSIS — Z96652 Presence of left artificial knee joint: Secondary | ICD-10-CM | POA: Diagnosis not present

## 2015-10-14 DIAGNOSIS — I051 Rheumatic mitral insufficiency: Secondary | ICD-10-CM | POA: Diagnosis not present

## 2015-10-14 DIAGNOSIS — Z471 Aftercare following joint replacement surgery: Secondary | ICD-10-CM | POA: Diagnosis not present

## 2015-10-15 DIAGNOSIS — Z79891 Long term (current) use of opiate analgesic: Secondary | ICD-10-CM | POA: Diagnosis not present

## 2015-10-15 DIAGNOSIS — K219 Gastro-esophageal reflux disease without esophagitis: Secondary | ICD-10-CM | POA: Diagnosis not present

## 2015-10-15 DIAGNOSIS — I10 Essential (primary) hypertension: Secondary | ICD-10-CM | POA: Diagnosis not present

## 2015-10-15 DIAGNOSIS — Z471 Aftercare following joint replacement surgery: Secondary | ICD-10-CM | POA: Diagnosis not present

## 2015-10-15 DIAGNOSIS — Z96652 Presence of left artificial knee joint: Secondary | ICD-10-CM | POA: Diagnosis not present

## 2015-10-15 DIAGNOSIS — M19042 Primary osteoarthritis, left hand: Secondary | ICD-10-CM | POA: Diagnosis not present

## 2015-10-15 DIAGNOSIS — M19041 Primary osteoarthritis, right hand: Secondary | ICD-10-CM | POA: Diagnosis not present

## 2015-10-15 DIAGNOSIS — I051 Rheumatic mitral insufficiency: Secondary | ICD-10-CM | POA: Diagnosis not present

## 2015-10-15 DIAGNOSIS — Z7901 Long term (current) use of anticoagulants: Secondary | ICD-10-CM | POA: Diagnosis not present

## 2015-10-15 DIAGNOSIS — M1711 Unilateral primary osteoarthritis, right knee: Secondary | ICD-10-CM | POA: Diagnosis not present

## 2015-10-18 DIAGNOSIS — M1711 Unilateral primary osteoarthritis, right knee: Secondary | ICD-10-CM | POA: Diagnosis not present

## 2015-10-18 DIAGNOSIS — M19042 Primary osteoarthritis, left hand: Secondary | ICD-10-CM | POA: Diagnosis not present

## 2015-10-18 DIAGNOSIS — Z7901 Long term (current) use of anticoagulants: Secondary | ICD-10-CM | POA: Diagnosis not present

## 2015-10-18 DIAGNOSIS — I051 Rheumatic mitral insufficiency: Secondary | ICD-10-CM | POA: Diagnosis not present

## 2015-10-18 DIAGNOSIS — Z96652 Presence of left artificial knee joint: Secondary | ICD-10-CM | POA: Diagnosis not present

## 2015-10-18 DIAGNOSIS — M19041 Primary osteoarthritis, right hand: Secondary | ICD-10-CM | POA: Diagnosis not present

## 2015-10-18 DIAGNOSIS — Z471 Aftercare following joint replacement surgery: Secondary | ICD-10-CM | POA: Diagnosis not present

## 2015-10-18 DIAGNOSIS — K219 Gastro-esophageal reflux disease without esophagitis: Secondary | ICD-10-CM | POA: Diagnosis not present

## 2015-10-18 DIAGNOSIS — I10 Essential (primary) hypertension: Secondary | ICD-10-CM | POA: Diagnosis not present

## 2015-10-18 DIAGNOSIS — Z79891 Long term (current) use of opiate analgesic: Secondary | ICD-10-CM | POA: Diagnosis not present

## 2015-10-19 DIAGNOSIS — I051 Rheumatic mitral insufficiency: Secondary | ICD-10-CM | POA: Diagnosis not present

## 2015-10-19 DIAGNOSIS — I10 Essential (primary) hypertension: Secondary | ICD-10-CM | POA: Diagnosis not present

## 2015-10-19 DIAGNOSIS — M19042 Primary osteoarthritis, left hand: Secondary | ICD-10-CM | POA: Diagnosis not present

## 2015-10-19 DIAGNOSIS — Z79891 Long term (current) use of opiate analgesic: Secondary | ICD-10-CM | POA: Diagnosis not present

## 2015-10-19 DIAGNOSIS — M1711 Unilateral primary osteoarthritis, right knee: Secondary | ICD-10-CM | POA: Diagnosis not present

## 2015-10-19 DIAGNOSIS — M19041 Primary osteoarthritis, right hand: Secondary | ICD-10-CM | POA: Diagnosis not present

## 2015-10-19 DIAGNOSIS — K219 Gastro-esophageal reflux disease without esophagitis: Secondary | ICD-10-CM | POA: Diagnosis not present

## 2015-10-19 DIAGNOSIS — Z7901 Long term (current) use of anticoagulants: Secondary | ICD-10-CM | POA: Diagnosis not present

## 2015-10-19 DIAGNOSIS — Z471 Aftercare following joint replacement surgery: Secondary | ICD-10-CM | POA: Diagnosis not present

## 2015-10-19 DIAGNOSIS — Z96652 Presence of left artificial knee joint: Secondary | ICD-10-CM | POA: Diagnosis not present

## 2015-10-20 ENCOUNTER — Other Ambulatory Visit: Payer: Self-pay | Admitting: Adult Health

## 2015-10-20 DIAGNOSIS — Z96652 Presence of left artificial knee joint: Secondary | ICD-10-CM | POA: Diagnosis not present

## 2015-10-20 DIAGNOSIS — M1711 Unilateral primary osteoarthritis, right knee: Secondary | ICD-10-CM | POA: Diagnosis not present

## 2015-10-20 DIAGNOSIS — M19041 Primary osteoarthritis, right hand: Secondary | ICD-10-CM | POA: Diagnosis not present

## 2015-10-20 DIAGNOSIS — M19042 Primary osteoarthritis, left hand: Secondary | ICD-10-CM | POA: Diagnosis not present

## 2015-10-20 DIAGNOSIS — K219 Gastro-esophageal reflux disease without esophagitis: Secondary | ICD-10-CM | POA: Diagnosis not present

## 2015-10-20 DIAGNOSIS — Z79891 Long term (current) use of opiate analgesic: Secondary | ICD-10-CM | POA: Diagnosis not present

## 2015-10-20 DIAGNOSIS — Z7901 Long term (current) use of anticoagulants: Secondary | ICD-10-CM | POA: Diagnosis not present

## 2015-10-20 DIAGNOSIS — I051 Rheumatic mitral insufficiency: Secondary | ICD-10-CM | POA: Diagnosis not present

## 2015-10-20 DIAGNOSIS — I10 Essential (primary) hypertension: Secondary | ICD-10-CM | POA: Diagnosis not present

## 2015-10-20 DIAGNOSIS — Z471 Aftercare following joint replacement surgery: Secondary | ICD-10-CM | POA: Diagnosis not present

## 2015-10-21 ENCOUNTER — Telehealth: Payer: Self-pay | Admitting: Internal Medicine

## 2015-10-21 DIAGNOSIS — Z96652 Presence of left artificial knee joint: Secondary | ICD-10-CM | POA: Diagnosis not present

## 2015-10-21 DIAGNOSIS — M19042 Primary osteoarthritis, left hand: Secondary | ICD-10-CM | POA: Diagnosis not present

## 2015-10-21 DIAGNOSIS — M19041 Primary osteoarthritis, right hand: Secondary | ICD-10-CM | POA: Diagnosis not present

## 2015-10-21 DIAGNOSIS — I10 Essential (primary) hypertension: Secondary | ICD-10-CM | POA: Diagnosis not present

## 2015-10-21 DIAGNOSIS — Z79891 Long term (current) use of opiate analgesic: Secondary | ICD-10-CM | POA: Diagnosis not present

## 2015-10-21 DIAGNOSIS — M1711 Unilateral primary osteoarthritis, right knee: Secondary | ICD-10-CM | POA: Diagnosis not present

## 2015-10-21 DIAGNOSIS — Z7901 Long term (current) use of anticoagulants: Secondary | ICD-10-CM | POA: Diagnosis not present

## 2015-10-21 DIAGNOSIS — I051 Rheumatic mitral insufficiency: Secondary | ICD-10-CM | POA: Diagnosis not present

## 2015-10-21 DIAGNOSIS — K219 Gastro-esophageal reflux disease without esophagitis: Secondary | ICD-10-CM | POA: Diagnosis not present

## 2015-10-21 DIAGNOSIS — Z471 Aftercare following joint replacement surgery: Secondary | ICD-10-CM | POA: Diagnosis not present

## 2015-10-21 NOTE — Telephone Encounter (Signed)
Needs refill of apixaban (ELIQUIS) 5 MG TABS tablet but unable to afford medication. Wants to know her options. Took last one this morning

## 2015-10-21 NOTE — Telephone Encounter (Signed)
Pt's spouse called in requesting to speak with CSW.  Spouse states pt took her last Eliquis today.  Patient went to obtain refill from pharmacy and copayment is $70.  Pt states unable to afford the $70 copayment and voiced concern if pt misses a dose.   CSW provided spouse with the number to the MAP office.  Spouse to contact MAP today and notify MAP pt is in her donut hole.  CSW attempted to utilized free 30 day card, however, pt has already utilized.  CSW will explore Eliquis PAP application.  Doubtful, if application can be completed prior to patient's next dose.

## 2015-10-21 NOTE — Telephone Encounter (Signed)
Pt unable to reach MAP office. CSW provided Ms. [name] with Eliquis customer services number [phone]).  CSW encouraged Ms. [name] to contact Eliquis customer service today and apply for a copayment savings card.  Hopeful, pt would qualify and Eliquis can call card info to pharmacy.  Pt provided with the phone number to her pharmacy on file.  Ms. [name] informed CSW has PAP application and will work on completing form on 10/22/15.  Pt provided monthly income and states she has spent more than 3% on prescription cost this year.  Pt aware documentation will need to be submitted to PAP.

## 2015-10-21 NOTE — Telephone Encounter (Signed)
Dr Selena Battenkim, can you look at this and see if you can help ? Thanks!

## 2015-10-22 ENCOUNTER — Encounter: Payer: Self-pay | Admitting: Pharmacist

## 2015-10-22 NOTE — Progress Notes (Signed)
Patient states she is in her Medicare gap and is unable to afford Eliquis (apixaban). She is able to afford the typical copay so will provide samples for now until patient is out of the gap and/or new Medicare plan becomes active.   Medication Samples have been provided to the patient.  Drug: apixaban (Eliquis) Strength: 5 mg Qty: 10 LOT: [id number] Exp.Date: 9/19 Dosing instructions: 1 tablet BID  The patient has been instructed regarding the correct time, dose, and frequency of taking this medication, including desired effects and most common side effects.   [name] 6:45 PM 10/22/2015  If new Eliquis copay is unaffordable, will discuss/facilitate transition to warfarin with the team. Will follow up with patient in 2 months. Patient was also advised to notify me if further concerns arise. Patient verbalized understanding.

## 2015-10-25 ENCOUNTER — Ambulatory Visit: Payer: Medicare Other

## 2015-10-25 ENCOUNTER — Encounter: Payer: Self-pay | Admitting: Internal Medicine

## 2015-10-25 DIAGNOSIS — M1711 Unilateral primary osteoarthritis, right knee: Secondary | ICD-10-CM | POA: Diagnosis not present

## 2015-10-25 DIAGNOSIS — M19041 Primary osteoarthritis, right hand: Secondary | ICD-10-CM | POA: Diagnosis not present

## 2015-10-25 DIAGNOSIS — Z7901 Long term (current) use of anticoagulants: Secondary | ICD-10-CM | POA: Diagnosis not present

## 2015-10-25 DIAGNOSIS — Z96652 Presence of left artificial knee joint: Secondary | ICD-10-CM | POA: Diagnosis not present

## 2015-10-25 DIAGNOSIS — K219 Gastro-esophageal reflux disease without esophagitis: Secondary | ICD-10-CM | POA: Diagnosis not present

## 2015-10-25 DIAGNOSIS — I051 Rheumatic mitral insufficiency: Secondary | ICD-10-CM | POA: Diagnosis not present

## 2015-10-25 DIAGNOSIS — Z471 Aftercare following joint replacement surgery: Secondary | ICD-10-CM | POA: Diagnosis not present

## 2015-10-25 DIAGNOSIS — I10 Essential (primary) hypertension: Secondary | ICD-10-CM | POA: Diagnosis not present

## 2015-10-25 DIAGNOSIS — M19042 Primary osteoarthritis, left hand: Secondary | ICD-10-CM | POA: Diagnosis not present

## 2015-10-25 DIAGNOSIS — Z79891 Long term (current) use of opiate analgesic: Secondary | ICD-10-CM | POA: Diagnosis not present

## 2015-10-26 DIAGNOSIS — K219 Gastro-esophageal reflux disease without esophagitis: Secondary | ICD-10-CM | POA: Diagnosis not present

## 2015-10-26 DIAGNOSIS — I10 Essential (primary) hypertension: Secondary | ICD-10-CM | POA: Diagnosis not present

## 2015-10-26 DIAGNOSIS — Z471 Aftercare following joint replacement surgery: Secondary | ICD-10-CM | POA: Diagnosis not present

## 2015-10-26 DIAGNOSIS — Z7901 Long term (current) use of anticoagulants: Secondary | ICD-10-CM | POA: Diagnosis not present

## 2015-10-26 DIAGNOSIS — M1711 Unilateral primary osteoarthritis, right knee: Secondary | ICD-10-CM | POA: Diagnosis not present

## 2015-10-26 DIAGNOSIS — M19041 Primary osteoarthritis, right hand: Secondary | ICD-10-CM | POA: Diagnosis not present

## 2015-10-26 DIAGNOSIS — Z79891 Long term (current) use of opiate analgesic: Secondary | ICD-10-CM | POA: Diagnosis not present

## 2015-10-26 DIAGNOSIS — Z96652 Presence of left artificial knee joint: Secondary | ICD-10-CM | POA: Diagnosis not present

## 2015-10-26 DIAGNOSIS — M19042 Primary osteoarthritis, left hand: Secondary | ICD-10-CM | POA: Diagnosis not present

## 2015-10-26 DIAGNOSIS — I051 Rheumatic mitral insufficiency: Secondary | ICD-10-CM | POA: Diagnosis not present

## 2015-10-27 DIAGNOSIS — I10 Essential (primary) hypertension: Secondary | ICD-10-CM | POA: Diagnosis not present

## 2015-10-27 DIAGNOSIS — Z79891 Long term (current) use of opiate analgesic: Secondary | ICD-10-CM | POA: Diagnosis not present

## 2015-10-27 DIAGNOSIS — Z96652 Presence of left artificial knee joint: Secondary | ICD-10-CM | POA: Diagnosis not present

## 2015-10-27 DIAGNOSIS — M19041 Primary osteoarthritis, right hand: Secondary | ICD-10-CM | POA: Diagnosis not present

## 2015-10-27 DIAGNOSIS — Z7901 Long term (current) use of anticoagulants: Secondary | ICD-10-CM | POA: Diagnosis not present

## 2015-10-27 DIAGNOSIS — M19042 Primary osteoarthritis, left hand: Secondary | ICD-10-CM | POA: Diagnosis not present

## 2015-10-27 DIAGNOSIS — K219 Gastro-esophageal reflux disease without esophagitis: Secondary | ICD-10-CM | POA: Diagnosis not present

## 2015-10-27 DIAGNOSIS — Z471 Aftercare following joint replacement surgery: Secondary | ICD-10-CM | POA: Diagnosis not present

## 2015-10-27 DIAGNOSIS — M1711 Unilateral primary osteoarthritis, right knee: Secondary | ICD-10-CM | POA: Diagnosis not present

## 2015-10-27 DIAGNOSIS — I051 Rheumatic mitral insufficiency: Secondary | ICD-10-CM | POA: Diagnosis not present

## 2015-10-28 ENCOUNTER — Encounter: Payer: Self-pay | Admitting: *Deleted

## 2015-10-28 DIAGNOSIS — Z7901 Long term (current) use of anticoagulants: Secondary | ICD-10-CM | POA: Diagnosis not present

## 2015-10-28 DIAGNOSIS — M19042 Primary osteoarthritis, left hand: Secondary | ICD-10-CM | POA: Diagnosis not present

## 2015-10-28 DIAGNOSIS — Z79891 Long term (current) use of opiate analgesic: Secondary | ICD-10-CM | POA: Diagnosis not present

## 2015-10-28 DIAGNOSIS — Z96652 Presence of left artificial knee joint: Secondary | ICD-10-CM | POA: Diagnosis not present

## 2015-10-28 DIAGNOSIS — I051 Rheumatic mitral insufficiency: Secondary | ICD-10-CM | POA: Diagnosis not present

## 2015-10-28 DIAGNOSIS — I10 Essential (primary) hypertension: Secondary | ICD-10-CM | POA: Diagnosis not present

## 2015-10-28 DIAGNOSIS — M1711 Unilateral primary osteoarthritis, right knee: Secondary | ICD-10-CM | POA: Diagnosis not present

## 2015-10-28 DIAGNOSIS — M19041 Primary osteoarthritis, right hand: Secondary | ICD-10-CM | POA: Diagnosis not present

## 2015-10-28 DIAGNOSIS — Z471 Aftercare following joint replacement surgery: Secondary | ICD-10-CM | POA: Diagnosis not present

## 2015-10-28 DIAGNOSIS — K219 Gastro-esophageal reflux disease without esophagitis: Secondary | ICD-10-CM | POA: Diagnosis not present

## 2015-11-01 ENCOUNTER — Telehealth: Payer: Self-pay | Admitting: Internal Medicine

## 2015-11-01 NOTE — Telephone Encounter (Signed)
APT. REMINDER CALL, LMTCB °

## 2015-11-02 ENCOUNTER — Ambulatory Visit (INDEPENDENT_AMBULATORY_CARE_PROVIDER_SITE_OTHER): Payer: Medicare Other | Admitting: Internal Medicine

## 2015-11-02 DIAGNOSIS — Z23 Encounter for immunization: Secondary | ICD-10-CM

## 2015-11-02 DIAGNOSIS — K219 Gastro-esophageal reflux disease without esophagitis: Secondary | ICD-10-CM

## 2015-11-02 DIAGNOSIS — Z79899 Other long term (current) drug therapy: Secondary | ICD-10-CM | POA: Diagnosis not present

## 2015-11-02 DIAGNOSIS — K5909 Other constipation: Secondary | ICD-10-CM

## 2015-11-02 DIAGNOSIS — I10 Essential (primary) hypertension: Secondary | ICD-10-CM

## 2015-11-02 MED ORDER — SENNOSIDES-DOCUSATE SODIUM 8.6-50 MG PO TABS: 2.0000 | ORAL_TABLET | Freq: Every day | ORAL | 0 refills | Status: DC

## 2015-11-02 MED ORDER — POLYETHYLENE GLYCOL 3350 17 G PO PACK: 17.0000 g | PACK | Freq: Every day | ORAL | 0 refills | Status: DC

## 2015-11-02 MED ORDER — FUROSEMIDE 20 MG PO TABS: ORAL_TABLET | ORAL | 2 refills | Status: DC

## 2015-11-02 NOTE — Patient Instructions (Addendum)
Ms. [name] it was nice meeting you today.   For blood pressure:  -Continue taking Amlodipine and Furosemide as before    For constipation:  -Start taking Miralax daily as instructed  -Start taking Senokot-S: 2 tablets daily at bedtime  -You may also take an over-the-counter fiber supplement such as Benefiber    Constipation, Adult Constipation is when a person has fewer than three bowel movements a week, has difficulty having a bowel movement, or has stools that are dry, hard, or larger than normal. As people grow older, constipation is more common. A low-fiber diet, not taking in enough fluids, and taking certain medicines may make constipation worse.  CAUSES   Certain medicines, such as antidepressants, pain medicine, iron supplements, antacids, and water pills.   Certain diseases, such as diabetes, irritable bowel syndrome (IBS), thyroid disease, or depression.   Not drinking enough water.   Not eating enough fiber-rich foods.   Stress or travel.   Lack of physical activity or exercise.   Ignoring the urge to have a bowel movement.   Using laxatives too much.  SIGNS AND SYMPTOMS   Having fewer than three bowel movements a week.   Straining to have a bowel movement.   Having stools that are hard, dry, or larger than normal.   Feeling full or bloated.   Pain in the lower abdomen.   Not feeling relief after having a bowel movement.  DIAGNOSIS  Your health care provider will take a medical history and perform a physical exam. Further testing may be done for severe constipation. Some tests may include:  A barium enema X-ray to examine your rectum, colon, and, sometimes, your small intestine.   A sigmoidoscopy to examine your lower colon.   A colonoscopy to examine your entire colon. TREATMENT  Treatment will depend on the severity of your constipation and what is causing it. Some dietary treatments include drinking more fluids and eating more  fiber-rich foods. Lifestyle treatments may include regular exercise. If these diet and lifestyle recommendations do not help, your health care provider may recommend taking over-the-counter laxative medicines to help you have bowel movements. Prescription medicines may be prescribed if over-the-counter medicines do not work.  HOME CARE INSTRUCTIONS   Eat foods that have a lot of fiber, such as fruits, vegetables, whole grains, and beans.  Limit foods high in fat and processed sugars, such as french fries, hamburgers, cookies, candies, and soda.   A fiber supplement may be added to your diet if you cannot get enough fiber from foods.   Drink enough fluids to keep your urine clear or pale yellow.   Exercise regularly or as directed by your health care provider.   Go to the restroom when you have the urge to go. Do not hold it.   Only take over-the-counter or prescription medicines as directed by your health care provider. Do not take other medicines for constipation without talking to your health care provider first.  SEEK IMMEDIATE MEDICAL CARE IF:   You have bright red blood in your stool.   Your constipation lasts for more than 4 days or gets worse.   You have abdominal or rectal pain.   You have thin, pencil-like stools.   You have unexplained weight loss. MAKE SURE YOU:   Understand these instructions.  Will watch your condition.  Will get help right away if you are not doing well or get worse.   This information is not intended to replace advice given to you  by your health care provider. Make sure you discuss any questions you have with your health care provider.   Document Released: 09/17/2003 Document Revised: 01/09/2014 Document Reviewed: 09/30/2012 Elsevier Interactive Patient Education [employer]2016 Elsevier Inc.

## 2015-11-04 NOTE — Assessment & Plan Note (Addendum)
A States her last BM was 4 days ago. Denies having any abdominal pain. States she is passing flatus. Reports feeling slightly nauseous this morning but nausea resolved after she ate breakfast. Continue to take Protonix 40 mg qd for GERD. Abdominal exam unremarkable.  P -Miralax daily  -Senokot-S daily at bedtime -Advised her to take an OTC fiber supplement such as Benefiber and increase her dietary fiber intake.

## 2015-11-04 NOTE — Assessment & Plan Note (Addendum)
A Initial BP 150/96, repeat 134/75. Goal is <150/90. Currently on amlodipine 10 mg qd and lasix 20 mg qd prn. She is euvolemic on exam.   P -Continue current mgmt

## 2015-11-04 NOTE — Progress Notes (Signed)
   CC: Patient is here to discuss HTN and chronic constipation.   HPI:  [name] is a 75 y.o. F with a PMHx of conditions listed below presenting to the clinic to discuss HTN and chronic constipation. Please see problem based charting for the status of the patient's current and chronic medical conditions.   Past Medical History:  Diagnosis Date  . Anemia 03/16/2014  . Atrial flutter (HCC) 05/07/2014   With controlled ventricular rate on no AV nodal agents.  . Chronic rheumatic heart disease 05/05/2014  . Constipation    takes Dulcolax as needed  . 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 Essential hypertension    takes Amlodipine daily  . GERD (gastroesophageal reflux disease)    takes Pantoprazole daily  . History of blood transfusion    no abnormal reaction noted  . History of migraine    last one a week  . History of shingles   . Mitral valve regurgitation, rheumatic 05/05/2014   Moderate to severe with peak PA pressure of 56 mmHg on Echo 04/2014   . Muscle spasm of both lower legs    takes Zanaflex daily as needed  . Nocturia   . Osteoarthritis    knees and hands  . Peripheral edema    takes Lasix daily as needed   . PONV (postoperative nausea and vomiting)    with recent dilatation of throat, had N/V  . Systolic murmur    per patient report she has had rheumatic fever in childhood    Review of Systems:  Pertinent positives mentioned in HPI. Remainder of all ROS negative.   Physical Exam:  Vitals:   11/02/15 1056  BP: (!) 150/96  Pulse: 78  Temp: 97.8 F (36.6 C)  TempSrc: Oral  SpO2: 100%  Weight: 178 lb 14.4 oz (81.1 kg)  Height: 5\' 4"  (1.626 m)   Physical Exam  Constitutional: She is oriented to person, place, and time. She appears well-developed and well-nourished. No distress.  HENT:  Mouth/Throat: Oropharynx is clear and moist.  Eyes: EOM are normal.    Neck: Neck supple. No tracheal deviation present.  Cardiovascular: Normal rate, regular rhythm and intact distal pulses.   Pulmonary/Chest: Effort normal and breath sounds normal. No respiratory distress.  Abdominal: Soft. Bowel sounds are normal. She exhibits no distension. There is no tenderness.  Musculoskeletal: Normal range of motion. She exhibits no edema.  Neurological: She is alert and oriented to person, place, and time.  Skin: Skin is warm and dry.    Assessment & Plan:   See Encounters Tab for problem based charting.  Patient discussed with Dr. Cleda DaubE. Hoffman

## 2015-11-08 NOTE — Progress Notes (Signed)
Internal Medicine Clinic Attending  Case discussed with Dr. Rathoreat the time of the visit. We reviewed the resident's history and exam and pertinent patient test results. I agree with the assessment, diagnosis, and plan of care documented in the resident's note.  

## 2015-11-10 DIAGNOSIS — M1712 Unilateral primary osteoarthritis, left knee: Secondary | ICD-10-CM | POA: Diagnosis not present

## 2015-12-01 ENCOUNTER — Encounter: Payer: Self-pay | Admitting: Cardiology

## 2015-12-01 DIAGNOSIS — H2513 Age-related nuclear cataract, bilateral: Secondary | ICD-10-CM | POA: Diagnosis not present

## 2015-12-01 DIAGNOSIS — H40013 Open angle with borderline findings, low risk, bilateral: Secondary | ICD-10-CM | POA: Diagnosis not present

## 2015-12-01 DIAGNOSIS — H40033 Anatomical narrow angle, bilateral: Secondary | ICD-10-CM | POA: Diagnosis not present

## 2015-12-01 DIAGNOSIS — H25013 Cortical age-related cataract, bilateral: Secondary | ICD-10-CM | POA: Diagnosis not present

## 2015-12-08 ENCOUNTER — Ambulatory Visit: Payer: Medicare Other | Admitting: Nurse Practitioner

## 2015-12-08 ENCOUNTER — Encounter: Payer: Self-pay | Admitting: Nurse Practitioner

## 2015-12-10 ENCOUNTER — Other Ambulatory Visit: Payer: Self-pay | Admitting: Internal Medicine

## 2015-12-10 ENCOUNTER — Ambulatory Visit: Payer: Medicare Other | Admitting: Nurse Practitioner

## 2015-12-13 ENCOUNTER — Encounter: Payer: Self-pay | Admitting: Nurse Practitioner

## 2015-12-13 ENCOUNTER — Ambulatory Visit (INDEPENDENT_AMBULATORY_CARE_PROVIDER_SITE_OTHER): Payer: Medicare Other | Admitting: Nurse Practitioner

## 2015-12-13 VITALS — BP 146/94 | HR 71 | Ht 64.0 in | Wt 184.4 lb

## 2015-12-13 DIAGNOSIS — I099 Rheumatic heart disease, unspecified: Secondary | ICD-10-CM

## 2015-12-13 DIAGNOSIS — I4819 Other persistent atrial fibrillation: Secondary | ICD-10-CM

## 2015-12-13 DIAGNOSIS — Z7901 Long term (current) use of anticoagulants: Secondary | ICD-10-CM | POA: Diagnosis not present

## 2015-12-13 DIAGNOSIS — I481 Persistent atrial fibrillation: Secondary | ICD-10-CM | POA: Diagnosis not present

## 2015-12-13 DIAGNOSIS — I10 Essential (primary) hypertension: Secondary | ICD-10-CM

## 2015-12-13 LAB — BASIC METABOLIC PANEL
BUN: 9 mg/dL (ref 7–25)
CO2: 28 mmol/L (ref 20–31)
Calcium: 9.5 mg/dL (ref 8.6–10.4)
Chloride: 102 mmol/L (ref 98–110)
Creat: 0.61 mg/dL (ref 0.60–0.93)
Glucose, Bld: 76 mg/dL (ref 65–99)
Potassium: 3.8 mmol/L (ref 3.5–5.3)
Sodium: 139 mmol/L (ref 135–146)

## 2015-12-13 LAB — LIPID PANEL
Cholesterol: 145 mg/dL (ref <200)
HDL: 51 mg/dL (ref >50)
LDL Cholesterol: 78 mg/dL (ref <100)
Total CHOL/HDL Ratio: 2.8 Ratio (ref <5.0)
Triglycerides: 81 mg/dL (ref <150)
VLDL: 16 mg/dL (ref <30)

## 2015-12-13 LAB — HEPATIC FUNCTION PANEL
ALT: 10 U/L (ref 6–29)
AST: 18 U/L (ref 10–35)
Albumin: 4 g/dL (ref 3.6–5.1)
Alkaline Phosphatase: 89 U/L (ref 33–130)
Bilirubin, Direct: 0.1 mg/dL (ref <=0.2)
Indirect Bilirubin: 0.3 mg/dL (ref 0.2–1.2)
Total Bilirubin: 0.4 mg/dL (ref 0.2–1.2)
Total Protein: 6.9 g/dL (ref 6.1–8.1)

## 2015-12-13 LAB — CBC
HCT: 41.3 % (ref 35.0–45.0)
Hemoglobin: 13.4 g/dL (ref 11.7–15.5)
MCH: 27.1 pg (ref 27.0–33.0)
MCHC: 32.4 g/dL (ref 32.0–36.0)
MCV: 83.6 fL (ref 80.0–100.0)
MPV: 10.4 fL (ref 7.5–12.5)
Platelets: 267 10*3/uL (ref 140–400)
RBC: 4.94 MIL/uL (ref 3.80–5.10)
RDW: 15.6 % — ABNORMAL HIGH (ref 11.0–15.0)
WBC: 5.9 10*3/uL (ref 3.8–10.8)

## 2015-12-13 MED ORDER — FUROSEMIDE 20 MG PO TABS: ORAL_TABLET | ORAL | 2 refills | Status: DC

## 2015-12-13 NOTE — Progress Notes (Signed)
CARDIOLOGY OFFICE NOTE  Date:  12/13/2015    [name] Date of Birth: 1940-09-06 Medical Record #[id number]  PCP:  Rocco Serene, MD  Cardiologist:  Tyrone Sage   Chief Complaint  Patient presents with  . Atrial Fibrillation    6 month check - former patient of Dr. Yevonne Pax    History of Present Illness: [name] is a 75 y.o. female who presents today for a 6 month check. Former patient of Dr. Yevonne Pax. She follows with me.   She has a history of chronic atrial fib, HTN, OA, rheumatic fever with MV regurgitation and anemia. She is on Eliquis for her anticoagulation. No beta blocker due to bradycardia.   She was found to be in atrial flutter when she presented for a surgical procedure at [facility] back on 04/13/2014.She had had a previous EKG on 03/16/14 which showed that she was in normal sinus rhythm at that time. She was cleared for the surgery and went on to have her surgical procedure without incident. She was found to have cricopharyngeal muscle hypertrophy - was treated with Botox.  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 Pretty limited by her arthritis. Cardiac status ok. She was cleared to get a knee replacement. Last seen in June - was doing well - had not returned back to work. Went on to have left knee replacement.   Comes back today. Here alone today. She feels like she is doing well. Had her other knee replaced - that went well. No palpitations. No bleeding. BP is up some here today. Does not check routinely at home but her daughter has a cuff. She admits she gets too much salt. Needs Lasix refilled. No longer working for the disabled gentleman.   Past Medical History:  Diagnosis Date  . Anemia 03/16/2014  . Atrial flutter (HCC) 05/07/2014   With controlled ventricular rate on no AV nodal agents.  . Chronic rheumatic heart  disease 05/05/2014  . Constipation    takes Dulcolax as needed  . 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 Essential hypertension    takes Amlodipine daily  . GERD (gastroesophageal reflux disease)    takes Pantoprazole daily  . History of blood transfusion    no abnormal reaction noted  . History of migraine    last one a week  . History of shingles   . Mitral valve regurgitation, rheumatic 05/05/2014   Moderate to severe with peak PA pressure of 56 mmHg on Echo 04/2014   . Muscle spasm of both lower legs    takes Zanaflex daily as needed  . Nocturia   . Osteoarthritis    knees and hands  . Peripheral edema    takes Lasix daily as needed   . PONV (postoperative nausea and vomiting)    with recent dilatation of throat, had N/V  . Systolic murmur    per patient report she has had rheumatic fever in childhood    Past Surgical History:  Procedure Laterality Date  . ABDOMINAL HYSTERECTOMY     Fibroids  . COLONOSCOPY W/ BIOPSIES AND POLYPECTOMY    . CYST EXCISION Right    on hand  . ESOPHAGOSCOPY WITH DILITATION N/A 04/14/2014   Procedure: RIGID ESOPHAGOSCOPY WITH DILITATION AND  BOTOX  INJECTION;  Surgeon: Osborn Cohoavid Shoemaker, MD;  [facility];  Service: ENT;  Laterality: N/A;  . MULTIPLE TOOTH EXTRACTIONS    . TOTAL KNEE ARTHROPLASTY Right 04/06/2015  . TOTAL KNEE ARTHROPLASTY Right 04/06/2015   Procedure: RIGHT TOTAL KNEE ARTHROPLASTY;  Surgeon: Marcene CorningPeter Dalldorf, MD;  Location: [facility] OR;  Service: Orthopedics;  Laterality: Right;  . TOTAL KNEE ARTHROPLASTY Left 09/14/2015   Procedure: TOTAL KNEE ARTHROPLASTY LEFT;  Surgeon: Marcene CorningPeter Dalldorf, MD;  [facility];  Service: Orthopedics;  Laterality: Left;     Medications: Current Outpatient Prescriptions  Medication Sig Dispense Refill  . amLODipine (NORVASC) 10 MG tablet Take 1 tablet (10 mg total) by mouth daily. 90 tablet 3  . apixaban  (ELIQUIS) 5 MG TABS tablet Take 1 tablet (5 mg total) by mouth 2 (two) times daily. 60 tablet 11  . bisacodyl (DULCOLAX) 5 MG EC tablet Take 10 mg by mouth daily as needed for moderate constipation.     . clobetasol ointment (TEMOVATE) 0.05 % Apply 1 application topically daily as needed (for skin irritation).     . furosemide (LASIX) 20 MG tablet TAKE 1 TABLET(20 MG) BY MOUTH DAILY AS NEEDED FOR FLUID RETENTION 30 tablet 2  . HYDROcodone-acetaminophen (NORCO) 7.5-325 MG tablet Take 1 tablet by mouth every 4 (four) hours as needed for moderate pain or severe pain. Give at 9am & 5 pm  for pain. Hold for sedation.     . methocarbamol (ROBAXIN) 750 MG tablet Take 750 mg by mouth every 6 (six) hours as needed for muscle spasms.     . pantoprazole (PROTONIX) 40 MG tablet TAKE 1 TABLET BY MOUTH EVERY DAY 30 tablet 0  . polyethylene glycol (MIRALAX / GLYCOLAX) packet Take 17 g by mouth daily. 14 each 0  . potassium chloride (K-DUR) 10 MEQ tablet Take 1 tablet (10 mEq total) by mouth every 12 (twelve) hours as needed (When lasix is given prn for fluid retention). 35 tablet 11  . tiZANidine (ZANAFLEX) 2 MG tablet Take 2 mg by mouth every 8 (eight) hours as needed for muscle spasms.    . traMADol (ULTRAM) 50 MG tablet Take 50-100 mg by mouth every 8 (eight) hours.     . vitamin B-12 (CYANOCOBALAMIN) 1000 MCG tablet Take 1,000 mcg by mouth daily.     No current facility-administered medications for this visit.     Allergies: Allergies  Allergen Reactions  . Ace Inhibitors Cough    Social History: The patient  reports that she has never smoked. She has never used smokeless tobacco. She reports that she does not drink alcohol or use drugs.   Family History: The patient's family history includes Asthma in her sister; Cancer in her brother, brother, and mother; Healthy in her daughter, daughter, daughter, and son; Other in her brother, brother, brother, brother, brother, and sister; Stroke in her father;  Unexplained death in her sister.   Review of Systems: Please see the history of present illness.   Otherwise, the review of systems is positive for none.   All other systems are reviewed and negative.   Physical Exam: VS:  BP (!) 146/94   Pulse 71   Ht 5\' 4"  (1.626 m)   Wt 184 lb 6.4 oz (83.6 kg)   SpO2 99% Comment: at rest  BMI 31.65 kg/m  .  BMI Body mass index is 31.65 kg/m.  Wt Readings from Last 3 Encounters:  12/13/15 184 lb 6.4 oz (83.6 kg)  11/02/15 178  lb 14.4 oz (81.1 kg)  10/05/15 174 lb (78.9 kg)    General: Pleasant. Well developed, well nourished and in no acute distress.  She has gained 6 pounds.  HEENT: Normal.  Neck: Supple, no JVD, carotid bruits, or masses noted.  Cardiac: Regular rate and rhythm. Soft outflow murmur. No edema in the feet/ankles.  Respiratory:  Lungs are clear to auscultation bilaterally with normal work of breathing.  GI: Soft and nontender.  MS: No deformity or atrophy. Gait and ROM intact.  Skin: Warm and dry. Color is normal.  Neuro:  Strength and sensation are intact and no gross focal deficits noted.  Psych: Alert, appropriate and with normal affect.   LABORATORY DATA:  EKG:  EKG is not ordered today.  Lab Results  Component Value Date   WBC 8.0 09/21/2015   HGB 10.9 (A) 09/21/2015   HCT 35 (A) 09/21/2015   PLT 359 09/21/2015   GLUCOSE 113 (H) 09/03/2015   CHOL 157 02/16/2015   TRIG 69 02/16/2015   HDL 47 02/16/2015   LDLCALC 96 02/16/2015   ALT 14 02/16/2015   AST 19 02/16/2015   NA 140 09/03/2015   K 3.8 09/03/2015   CL 106 09/03/2015   CREATININE 0.75 09/03/2015   BUN 8 09/03/2015   CO2 24 09/03/2015   TSH 2.97 02/16/2015   INR 1.25 09/03/2015   HGBA1C 5.6 03/16/2014    BNP (last 3 results) No results for input(s): BNP in the last 8760 hours.  ProBNP (last 3 results) No results for input(s): PROBNP in the last 8760 hours.   Other Studies Reviewed Today:  Echo Study Conclusions from 04/2015  - Left  ventricle: The cavity size was normal. There was mild focal  basal hypertrophy of the septum. Systolic function was normal.  The estimated ejection fraction was in the range of 55% to 60%.  Wall motion was normal; there were no regional wall motion  abnormalities. - Aortic valve: There was mild stenosis. - Mitral valve: There was mild regurgitation. - Left atrium: The atrium was moderately to severely dilated. - Pulmonary arteries: Systolic pressure was mildly increased. PA  peak pressure: 32 mm Hg (S).  Impressions:  - Mitral regurgitation was previously described as moderate to  severe. Images could not be directly compared.    Assessment/Plan:  1. PAF/flutter - managed with rate control and anticoagulation - she is in a regular rhythm by exam today.   2. Chronic anticoagulation - no problems noted. CBC today.   3. HTN - BP by me is 150/90. She admits to using too much salt - she says she can cut back. Will check some readings at home as well.   4. Rheumatic heart disease with MR - recent echo from this past April was stable  5. Fatigue - seems improved.    Current medicines are reviewed with the patient today.  The patient does not have concerns regarding medicines other than what has been noted above.  The following changes have been made:  See above.  Labs/ tests ordered today include:    Orders Placed This Encounter  Procedures  . Basic metabolic panel  . Hepatic function panel  . Lipid panel  . CBC     Disposition:   FU with me in 3 months.   Patient is agreeable to this plan and will call if any problems develop in the interim.   Signed: Rosalio MacadamiaLori C. Keitha Kolk, RN, ANP-C 12/13/2015 12:05 PM  [facility]  [address] [place], [place]  [address] Phone: [phone] Fax: [phone]

## 2015-12-13 NOTE — Patient Instructions (Addendum)
We will be checking the following labs today - BMET, CBC, Lipids and HPF   Medication Instructions:    Continue with your current medicines.   I have refilled your Lasix today.    Testing/Procedures To Be Arranged:  N/A  Follow-Up:   See me in 3 months    Other Special Instructions:   Cut back on the salt/sodium  Have your daughter check your BP for me - keep a record and bring to me at your next visit.     If you need a refill on your cardiac medications before your next appointment, please call your pharmacy.   Call the [facility] office at [phone] if you have any questions, problems or concerns.

## 2015-12-14 DIAGNOSIS — H25012 Cortical age-related cataract, left eye: Secondary | ICD-10-CM | POA: Diagnosis not present

## 2015-12-14 DIAGNOSIS — H25812 Combined forms of age-related cataract, left eye: Secondary | ICD-10-CM | POA: Diagnosis not present

## 2015-12-14 DIAGNOSIS — H2512 Age-related nuclear cataract, left eye: Secondary | ICD-10-CM | POA: Diagnosis not present

## 2016-01-06 ENCOUNTER — Ambulatory Visit: Payer: Medicare Other | Admitting: Pharmacist

## 2016-01-06 NOTE — Progress Notes (Signed)
Medication Samples have been provided to the patient.  Drug: apixaban (Eliquis) Strength: 5 mg Qty: 42 LOT: [id number] Exp.Date: Jan 20 Dosing instructions: 1 tablet BID  The patient has been instructed regarding the correct time, dose, and frequency of taking this medication, including desired effects and most common side effects.   Kamdyn Covel J 2:08 PM 01/06/2016

## 2016-01-28 ENCOUNTER — Ambulatory Visit: Payer: Medicare Other | Admitting: Pharmacist

## 2016-01-28 ENCOUNTER — Ambulatory Visit (INDEPENDENT_AMBULATORY_CARE_PROVIDER_SITE_OTHER): Payer: Medicare Other | Admitting: Internal Medicine

## 2016-01-28 DIAGNOSIS — I10 Essential (primary) hypertension: Secondary | ICD-10-CM

## 2016-01-28 DIAGNOSIS — Z79899 Other long term (current) drug therapy: Secondary | ICD-10-CM

## 2016-01-28 NOTE — Progress Notes (Signed)
[name] is a 76 y.o. female who reports to the clinic for monitoring of apixaban (Eliquis) therapy.    ASSESSMENT Indication(s): atrial flutter CHA2DS2VASC score 4, HAS-BLED 2 Duration: indefinite  Labs:    Component Value Date/Time   AST 18 12/13/2015 1220   ALT 10 12/13/2015 1220   NA 139 12/13/2015 1220   NA 139 04/12/2015   K 3.8 12/13/2015 1220   CL 102 12/13/2015 1220   CO2 28 12/13/2015 1220   GLUCOSE 76 12/13/2015 1220   HGBA1C 5.6 03/16/2014 0616   BUN 9 12/13/2015 1220   BUN 10 04/12/2015   CREATININE 0.61 12/13/2015 1220   CALCIUM 9.5 12/13/2015 1220   GFRAA >60 09/03/2015 1215   GFRAA >89 05/22/2014 1202   WBC 5.9 12/13/2015 1220   HGB 13.4 12/13/2015 1220   HCT 41.3 12/13/2015 1220   PLT 267 12/13/2015 1220   apixaban (Eliquis) Dose: 5 mg BID  A/P Patient states she is unable to afford copay Notified cardiology w/ recommendation to consider switch to warfarin Cardiology appointment on 03/20/16 Medication Samples have been provided to the patient.  Drug: apixaban (Eliquis) Strength: 2.5 mg Qty: 5 LOT: [id number] Exp.Date: 5/18  Dosing instructions: 2 tablets BID  The patient has been instructed regarding the correct time, dose, and frequency of taking this medication, including desired effects and most common side effects.   Patient Instructions: Patient advised to contact clinic or seek medical attention if signs/symptoms of bleeding or thromboembolism occur. Patient verbalized understanding by repeating back information.  Marzetta BoardJennifer Kim Clinical Pharmacist  01/28/2016, 5:02 PM

## 2016-01-28 NOTE — Progress Notes (Signed)
   CC: Blood pressure  HPI:  [name] is a 76 y.o. female with a past medical history listed below here today for follow up of her HTN.  For details of today's visit and the status of her chronic medical issues please refer to the assessment and plan.  Past Medical History:  Diagnosis Date  . Anemia 03/16/2014  . Atrial flutter (HCC) 05/07/2014   With controlled ventricular rate on no AV nodal agents.  . Chronic rheumatic heart disease 05/05/2014  . Constipation    takes Dulcolax as needed  . 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 Essential hypertension    takes Amlodipine daily  . GERD (gastroesophageal reflux disease)    takes Pantoprazole daily  . History of blood transfusion    no abnormal reaction noted  . History of migraine    last one a week  . History of shingles   . Mitral valve regurgitation, rheumatic 05/05/2014   Moderate to severe with peak PA pressure of 56 mmHg on Echo 04/2014   . Muscle spasm of both lower legs    takes Zanaflex daily as needed  . Nocturia   . Osteoarthritis    knees and hands  . Peripheral edema    takes Lasix daily as needed   . PONV (postoperative nausea and vomiting)    with recent dilatation of throat, had N/V  . Systolic murmur    per patient report she has had rheumatic fever in childhood    Review of Systems:   See HPI  Physical Exam:  Vitals:   01/28/16 0944  BP: (!) 153/84  Pulse: 60  Temp: 98.1 F (36.7 C)  TempSrc: Oral  SpO2: 100%  Weight: 185 lb 14.4 oz (84.3 kg)  Height: 5\' 4"  (1.626 m)   Physical Exam  Constitutional: She is well-developed, well-nourished, and in no distress. No distress.  Cardiovascular: Normal rate, regular rhythm and normal heart sounds.   Pulmonary/Chest: Effort normal and breath sounds normal.    Assessment & Plan:   See Encounters Tab for problem based charting.  Patient discussed  with Dr. Heide SparkNarendra

## 2016-01-28 NOTE — Patient Instructions (Signed)
Ms. [name],  Please continue to check your blood pressures at home. I would like them to be less than 150 for the top number and less than 90 on the bottom number. Please write these down with you and bring it to all of your visits. If you notice that they are stay above that 150/90 goal, please call the clinic and schedule another appointment.  We will have Dr. Selena BattenKim or Dr. Eilene GhaziGroace contact you about your Eliquis.  Please schedule a follow up visit with Dr. Josem KaufmannKlima when he has an opening.

## 2016-01-28 NOTE — Addendum Note (Signed)
Addended by: Mliss FritzKIM, Aireona Torelli J on: 01/28/2016 05:25 PM   Modules accepted: Level of Service

## 2016-01-30 NOTE — Assessment & Plan Note (Signed)
BP Readings from Last 3 Encounters:  01/28/16 (!) 153/84  12/13/15 (!) 146/94  11/02/15 (!) 150/96    Lab Results  Component Value Date   NA 139 12/13/2015   K 3.8 12/13/2015   CREATININE 0.61 12/13/2015    Currently on amlodipine 10 mg daily and lasix 20 mg daily.   Reports daughter checked her blood pressure and it was high. Unable to remember what the readings were at that time. Possibly in the 160s at that time. Felt different but unable to describe further when it was high. Reports salt indiscretion at that time. Checks 1-2 times a week and reports it usually is 'OK.' She reports its usually in the 140-150s. She did not bring her BP log with her today. Denies headaches, vision changes, chest pain, nausea/vomiting, dizziness/lightheadedness.  Goal <150/90  Assessment: Stable HTN  Plan: Difficult to ascertain what BP actually runs at home and she did not bring her log with her today. Appears she had one elevated reading which prompted her to make an appointment. BP has been within target range at clinic visits and is only mildly elevated today.   Will continue current medications today and ask to bring BP log with her to next visit.

## 2016-01-31 ENCOUNTER — Telehealth: Payer: Self-pay | Admitting: *Deleted

## 2016-01-31 NOTE — Telephone Encounter (Signed)
S/w pt explained did not have enough samples of eliquis to keep pt in samples. Talked a little bit about coumadin.  Pt asked for daughter to be called per DPR.  S/w daughter explain  The same thing and stated a pharmacist from office can call and explain in more detail.  S/w Meghan Supple, PHarm-D stated would be happy to t/w pt. Will route message to Meghan to call pt's daughter.

## 2016-01-31 NOTE — Telephone Encounter (Signed)
-----   Message from Rosalio MacadamiaLori C Gerhardt, NP sent at 01/31/2016  7:34 AM EST ----- Regarding: FW: Patient FYI  Adam Sanjuan,  Would not wait til appointment - please call and see if she would like to go ahead and switch to warfarin and place her in the coumadin clinic here.  Thanks lori ----- Message ----- From: Mliss FritzJennifer J Kim, PharmD Sent: 01/28/2016   4:59 PM To: Rosalio MacadamiaLori C Gerhardt, NP Subject: Patient Isidore MoosFYI                                    Hi Lore, Patient is having trouble affording Eliquis, she has an appointment with you 03/20/16. I have been able to provide Eliquis samples, but sometimes run out and did not want patient to go without medication. Was wondering if we should switch her to warfarin, would be happy to coordinate, please let me know.   Thank you

## 2016-01-31 NOTE — Progress Notes (Signed)
Internal Medicine Clinic Attending  Case discussed with Dr. Boswell at the time of the visit.  We reviewed the resident's history and exam and pertinent patient test results.  I agree with the assessment, diagnosis, and plan of care documented in the resident's note.  

## 2016-01-31 NOTE — Telephone Encounter (Signed)
Spoke with patient's daughter and discussed pros and cons of Eliquis vs warfarin. She states that she would like her mother to remain on Eliquis and that they will be ok affording copays. Encouraged pt to call clinic with any other questions.

## 2016-02-05 ENCOUNTER — Other Ambulatory Visit: Payer: Self-pay | Admitting: Internal Medicine

## 2016-02-05 DIAGNOSIS — K5909 Other constipation: Secondary | ICD-10-CM

## 2016-02-07 DIAGNOSIS — R1314 Dysphagia, pharyngoesophageal phase: Secondary | ICD-10-CM | POA: Diagnosis not present

## 2016-02-07 DIAGNOSIS — K219 Gastro-esophageal reflux disease without esophagitis: Secondary | ICD-10-CM | POA: Diagnosis not present

## 2016-02-08 ENCOUNTER — Other Ambulatory Visit: Payer: Self-pay | Admitting: Internal Medicine

## 2016-02-09 DIAGNOSIS — M1711 Unilateral primary osteoarthritis, right knee: Secondary | ICD-10-CM | POA: Diagnosis not present

## 2016-02-09 DIAGNOSIS — M1712 Unilateral primary osteoarthritis, left knee: Secondary | ICD-10-CM | POA: Diagnosis not present

## 2016-02-12 ENCOUNTER — Other Ambulatory Visit: Payer: Self-pay | Admitting: Internal Medicine

## 2016-02-12 DIAGNOSIS — I099 Rheumatic heart disease, unspecified: Secondary | ICD-10-CM

## 2016-03-06 DIAGNOSIS — M1711 Unilateral primary osteoarthritis, right knee: Secondary | ICD-10-CM | POA: Diagnosis not present

## 2016-03-07 ENCOUNTER — Encounter: Payer: Self-pay | Admitting: Internal Medicine

## 2016-03-07 DIAGNOSIS — H35033 Hypertensive retinopathy, bilateral: Secondary | ICD-10-CM

## 2016-03-07 HISTORY — DX: Hypertensive retinopathy, bilateral: H35.033

## 2016-03-09 ENCOUNTER — Other Ambulatory Visit: Payer: Self-pay | Admitting: Pharmacist

## 2016-03-09 ENCOUNTER — Encounter: Payer: Self-pay | Admitting: Internal Medicine

## 2016-03-09 ENCOUNTER — Ambulatory Visit (INDEPENDENT_AMBULATORY_CARE_PROVIDER_SITE_OTHER): Payer: Medicare Other | Admitting: Internal Medicine

## 2016-03-09 VITALS — BP 140/78 | HR 70 | Temp 98.1°F | Wt 186.9 lb

## 2016-03-09 DIAGNOSIS — I4892 Unspecified atrial flutter: Secondary | ICD-10-CM | POA: Diagnosis not present

## 2016-03-09 DIAGNOSIS — Z7901 Long term (current) use of anticoagulants: Secondary | ICD-10-CM

## 2016-03-09 DIAGNOSIS — M436 Torticollis: Secondary | ICD-10-CM | POA: Diagnosis not present

## 2016-03-09 DIAGNOSIS — Z79899 Other long term (current) drug therapy: Secondary | ICD-10-CM

## 2016-03-09 DIAGNOSIS — N904 Leukoplakia of vulva: Secondary | ICD-10-CM

## 2016-03-09 DIAGNOSIS — M25562 Pain in left knee: Secondary | ICD-10-CM

## 2016-03-09 DIAGNOSIS — M150 Primary generalized (osteo)arthritis: Secondary | ICD-10-CM

## 2016-03-09 DIAGNOSIS — K219 Gastro-esophageal reflux disease without esophagitis: Secondary | ICD-10-CM

## 2016-03-09 DIAGNOSIS — M810 Age-related osteoporosis without current pathological fracture: Secondary | ICD-10-CM

## 2016-03-09 DIAGNOSIS — I099 Rheumatic heart disease, unspecified: Secondary | ICD-10-CM

## 2016-03-09 DIAGNOSIS — I483 Typical atrial flutter: Secondary | ICD-10-CM

## 2016-03-09 DIAGNOSIS — K5909 Other constipation: Secondary | ICD-10-CM

## 2016-03-09 DIAGNOSIS — Z6832 Body mass index (BMI) 32.0-32.9, adult: Secondary | ICD-10-CM | POA: Diagnosis not present

## 2016-03-09 DIAGNOSIS — I10 Essential (primary) hypertension: Secondary | ICD-10-CM | POA: Diagnosis not present

## 2016-03-09 DIAGNOSIS — E669 Obesity, unspecified: Secondary | ICD-10-CM | POA: Diagnosis not present

## 2016-03-09 DIAGNOSIS — I051 Rheumatic mitral insufficiency: Secondary | ICD-10-CM | POA: Diagnosis not present

## 2016-03-09 DIAGNOSIS — M159 Polyosteoarthritis, unspecified: Secondary | ICD-10-CM

## 2016-03-09 DIAGNOSIS — Z96653 Presence of artificial knee joint, bilateral: Secondary | ICD-10-CM

## 2016-03-09 DIAGNOSIS — M8949 Other hypertrophic osteoarthropathy, multiple sites: Secondary | ICD-10-CM

## 2016-03-09 DIAGNOSIS — M542 Cervicalgia: Secondary | ICD-10-CM

## 2016-03-09 MED ORDER — PANTOPRAZOLE SODIUM 40 MG PO TBEC: 40.0000 mg | DELAYED_RELEASE_TABLET | Freq: Every day | ORAL | 3 refills | Status: DC

## 2016-03-09 MED ORDER — FUROSEMIDE 20 MG PO TABS: 20.0000 mg | ORAL_TABLET | Freq: Every day | ORAL | 3 refills | Status: DC | PRN

## 2016-03-09 MED ORDER — POTASSIUM CHLORIDE ER 10 MEQ PO TBCR: 10.0000 meq | EXTENDED_RELEASE_TABLET | Freq: Every day | ORAL | 3 refills | Status: DC | PRN

## 2016-03-09 MED ORDER — BETAMETHASONE DIPROPIONATE 0.05 % EX OINT: TOPICAL_OINTMENT | Freq: Two times a day (BID) | CUTANEOUS | 3 refills | Status: DC
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
## 2016-03-09 NOTE — Assessment & Plan Note (Signed)
Assessment  Her lichen sclerosus of the vulva is symptomatically well controlled on the clobetasol 0.05% ointment applied every 24 hours as needed for itching. Her main issue is the cost of this therapy.  Plan  We will continue with the clobetasol 0.05% ointment applied every 24 hours as needed for pruritus of the vulva, but ask pharmacy to assess if there is any other equipotent topical steroid ointment that may be more cost effective given her limited budget.

## 2016-03-09 NOTE — Assessment & Plan Note (Signed)
Assessment  Her gastroesophageal reflux disease is symptomatically well controlled on the pantoprazole 40 mg by mouth daily. She would like to continue with this therapy at this time.  Plan  We will continue pantoprazole 40 mg daily and reassess efficacy of this therapy at the follow-up visit.

## 2016-03-09 NOTE — Assessment & Plan Note (Signed)
Assessment  In November 2018 it will be 2 years since she has been on a bisphosphonate holiday. She has had no symptomatic issues with her osteoporosis and will be due for a DEXA scan to assess for progression while on the bisphosphonate holiday.  Plan  At the follow-up visit we will place an order for a DEXA scan to be done in November 2018 or later to reassess her osteoporosis after a 2 year hiatus from bisphosphonate therapy.

## 2016-03-09 NOTE — Assessment & Plan Note (Signed)
Assessment  Clinically she remains in atrial flutter with a controlled rate despite no AV nodal agents. She and her family have decided to remain on the Eliquis and will come up with the necessary copay rather than convert to warfarin.  Plan  We will continue the Eliquis for CVA prophylaxis and monitor her rate at the follow-up visit.

## 2016-03-09 NOTE — Assessment & Plan Note (Signed)
Assessment  Her blood pressure today was at target at 140/78. This is on amlodipine 10 mg by mouth daily. She is tolerating this medication well.  Plan  We will continue the amlodipine at 10 mg by mouth daily and reassess the blood pressure at follow-up visit.
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
## 2016-03-09 NOTE — Assessment & Plan Note (Signed)
Assessment  She continues to have constipation but feels it is well controlled on the Dulcolax and MiraLAX. She feels the Dulcolax is much more effective than the MiraLAX.  Plan  We will continue both the Dulcolax and MiraLAX for chronic constipation and reassess the efficacy of this therapy at the follow-up visit.

## 2016-03-09 NOTE — Assessment & Plan Note (Signed)
Assessment  Her weight is up 1 pound since the last visit within the health system one month ago.  Plan  She was encouraged to continue watching her portions and maintaining activity given she has recovered well from her bilateral total knee arthroplasties. We will reassess her weight at follow-up visit.

## 2016-03-09 NOTE — Patient Instructions (Signed)
It was good to see you today.  1) I will ask the pharmacist if there is a cheaper version of the cream and I will get back with you with the answer.  2) Please keep taking your other medications as you are.  3) This is an updated list of all of your medications.  4)  We filled out insurance paperwork for you today.  I will see you back in 6 months, sooner if necessary.

## 2016-03-09 NOTE — Assessment & Plan Note (Signed)
Assessment  She has chronic rheumatic heart disease with mild mitral regurgitation. Despite this she is asymptomatic from this process. That being said, she does have atrial flutter and requires chronic anticoagulation.  Plan  We're continuing the as needed Lasix for the periodic lower extremity edema. When she takes Lasix she also takes potassium chloride supplementation. We will reassess for evidence of symptomatic rheumatic heart disease from worsening mitral regurgitation at the follow-up visit.

## 2016-03-09 NOTE — Assessment & Plan Note (Signed)
She is currently up-to-date on her health care maintenance with the exertion of the Zostavax. She will ask her insurance company [employer] plan what the out-of-pocket costs would be for this vaccination to see if she can afford it. We will reassess at the follow-up visit.

## 2016-03-11 ENCOUNTER — Other Ambulatory Visit: Payer: Self-pay | Admitting: Adult Health

## 2016-03-11 DIAGNOSIS — I483 Typical atrial flutter: Secondary | ICD-10-CM

## 2016-03-14 MED ORDER — POLYETHYLENE GLYCOL 3350 17 G PO PACK: 17.0000 g | PACK | Freq: Every day | ORAL | 3 refills | Status: DC

## 2016-03-14 MED ORDER — APIXABAN 5 MG PO TABS: 5.0000 mg | ORAL_TABLET | Freq: Two times a day (BID) | ORAL | 3 refills | Status: DC

## 2016-03-14 MED ORDER — CLOBETASOL PROPIONATE 0.05 % EX OINT: 1.0000 "application " | TOPICAL_OINTMENT | Freq: Every day | CUTANEOUS | 3 refills | Status: DC | PRN

## 2016-03-14 MED ORDER — PANTOPRAZOLE SODIUM 40 MG PO TBEC: 40.0000 mg | DELAYED_RELEASE_TABLET | Freq: Every day | ORAL | 3 refills | Status: DC

## 2016-03-14 MED ORDER — POTASSIUM CHLORIDE ER 10 MEQ PO TBCR: 10.0000 meq | EXTENDED_RELEASE_TABLET | Freq: Every day | ORAL | 3 refills | Status: DC | PRN

## 2016-03-14 MED ORDER — FUROSEMIDE 20 MG PO TABS: 20.0000 mg | ORAL_TABLET | Freq: Every day | ORAL | 3 refills | Status: DC | PRN

## 2016-03-14 MED ORDER — AMLODIPINE BESYLATE 10 MG PO TABS: 10.0000 mg | ORAL_TABLET | Freq: Every day | ORAL | 3 refills | Status: DC

## 2016-03-14 NOTE — Progress Notes (Signed)
Transferred prescriptions to [facility] pharmacy per patient request, PCP approved.

## 2016-03-20 ENCOUNTER — Ambulatory Visit (INDEPENDENT_AMBULATORY_CARE_PROVIDER_SITE_OTHER): Payer: Medicare Other | Admitting: Nurse Practitioner

## 2016-03-20 ENCOUNTER — Encounter: Payer: Self-pay | Admitting: Nurse Practitioner

## 2016-03-20 VITALS — BP 140/90 | HR 57 | Ht 64.0 in | Wt 185.1 lb

## 2016-03-20 DIAGNOSIS — I481 Persistent atrial fibrillation: Secondary | ICD-10-CM

## 2016-03-20 DIAGNOSIS — I099 Rheumatic heart disease, unspecified: Secondary | ICD-10-CM

## 2016-03-20 DIAGNOSIS — Z7901 Long term (current) use of anticoagulants: Secondary | ICD-10-CM | POA: Diagnosis not present

## 2016-03-20 DIAGNOSIS — I10 Essential (primary) hypertension: Secondary | ICD-10-CM

## 2016-03-20 DIAGNOSIS — I4819 Other persistent atrial fibrillation: Secondary | ICD-10-CM

## 2016-03-20 NOTE — Patient Instructions (Addendum)
We will be checking the following labs today - NONE   Medication Instructions:    Continue with your current medicines.     Testing/Procedures To Be Arranged:  N/A  Follow-Up:   See me in 6 months with fasting labs    Other Special Instructions:   N/A    If you need a refill on your cardiac medications before your next appointment, please call your pharmacy.   Call the [facility] office at [phone] if you have any questions, problems or concerns.      

## 2016-03-20 NOTE — Progress Notes (Signed)
CARDIOLOGY OFFICE NOTE  Date:  03/20/2016    Abigail HeritageLizzie M Linebaugh Date of Birth: 07/04/1940 Medical Record #[id number]  PCP:  Rocco SereneKLIMA, LAWRENCE D, MD  Cardiologist:  Tyrone SageGerhardt   Chief Complaint  Patient presents with  . Atrial Fibrillation    Follow up visit     History of Present Illness: [name] is a 76 y.o. female who presents today for a 3 month check. Former patient of Dr. Yevonne PaxBrackbill's. She follows with me.   She has a history of chronic atrial fib, HTN, OA, rheumatic fever with MV regurgitation and anemia. She is on Eliquis for her anticoagulation. No beta blocker due to bradycardia.   She was found to be in atrial flutter when she presented for a surgical procedure [facility] back on 04/13/2014.She had had a previous EKG on 03/16/14 which showed that she was in normal sinus rhythm at that time. She was cleared for the surgery and went on to have her surgical procedure without incident. She was found to have cricopharyngeal muscle hypertrophy - was treated with Botox.  I have seen her back several times - she has had both knees done in the interim. She has done well. She had stopped working.   Comes back today. Here alone today. She notes she is doing well. No chest pain. Breathing is good. No palpitations. Trying to restrict her salt more. Taking her medicines. 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   Past Medical History:  Diagnosis Date  . Anemia 03/16/2014  . Atrial flutter (HCC) 05/07/2014   With controlled ventricular rate on no AV nodal agents.  . Chronic rheumatic heart disease 05/05/2014  . Constipation    takes Dulcolax as needed  . 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  Essential hypertension    takes Amlodipine daily  . GERD (gastroesophageal reflux disease)    takes Pantoprazole daily  . History of blood transfusion    no abnormal reaction noted  . History of migraine    last one a week  . History of shingles   . Hypertensive retinopathy of both eyes 03/07/2016   Moderate  . Mitral valve regurgitation, rheumatic 05/05/2014   Moderate to severe with peak PA pressure of 56 mmHg on Echo 04/2014   . Muscle spasm of both lower legs    takes Zanaflex daily as needed  . Nocturia   . Osteoarthritis    knees and hands  . Peripheral edema    takes Lasix daily as needed   . PONV (postoperative nausea and vomiting)    with recent dilatation of throat, had N/V  . Systolic murmur    per patient report she has had rheumatic fever in childhood    Past Surgical History:  Procedure Laterality Date  . ABDOMINAL HYSTERECTOMY     Fibroids  . COLONOSCOPY W/ BIOPSIES AND POLYPECTOMY    . CYST EXCISION Right    on hand  . ESOPHAGOSCOPY WITH DILITATION N/A 04/14/2014   Procedure: RIGID ESOPHAGOSCOPY WITH DILITATION AND BOTOX  INJECTION;  Surgeon: Osborn Cohoavid Shoemaker, MD;  [facility];  Service: ENT;  Laterality: N/A;  . MULTIPLE TOOTH EXTRACTIONS    . TOTAL KNEE ARTHROPLASTY Right 04/06/2015  .  TOTAL KNEE ARTHROPLASTY Right 04/06/2015   Procedure: RIGHT TOTAL KNEE ARTHROPLASTY;  Surgeon: Marcene Corning, MD;  Location: [facility] OR;  Service: Orthopedics;  Laterality: Right;  . TOTAL KNEE ARTHROPLASTY Left 09/14/2015   Procedure: TOTAL KNEE ARTHROPLASTY LEFT;  Surgeon: Marcene Corning, MD;  Location: [facility] OR;  Service: Orthopedics;  Laterality: Left;     Medications: Current Outpatient Prescriptions  Medication Sig Dispense Refill  . amLODipine (NORVASC) 10 MG tablet Take 1 tablet (10 mg total) by mouth daily. 90 tablet 3  . apixaban (ELIQUIS) 5 MG TABS tablet Take 1 tablet (5 mg total) by mouth 2 (two) times daily. 180 tablet 3  . bisacodyl (DULCOLAX) 5 MG EC tablet Take 10  mg by mouth daily as needed for moderate constipation.     . clobetasol ointment (TEMOVATE) 0.05 % Apply 1 application topically daily as needed (for skin irritation). 30 g 3  . furosemide (LASIX) 20 MG tablet Take 1 tablet (20 mg total) by mouth daily as needed for edema. 90 tablet 3  . pantoprazole (PROTONIX) 40 MG tablet Take 1 tablet (40 mg total) by mouth daily. 90 tablet 3  . polyethylene glycol (MIRALAX / GLYCOLAX) packet Take 17 g by mouth daily. 100 packet 3  . potassium chloride (K-DUR) 10 MEQ tablet Take 1 tablet (10 mEq total) by mouth daily as needed (When lasix is given prn for fluid retention). 90 tablet 3  . traMADol (ULTRAM) 50 MG tablet Take 50-100 mg by mouth every 8 (eight) hours.      No current facility-administered medications for this visit.     Allergies: Allergies  Allergen Reactions  . Ace Inhibitors Cough    Social History: The patient  reports that she has never smoked. She has never used smokeless tobacco. She reports that she does not drink alcohol or use drugs.   Family History: The patient's family history includes Asthma in her sister; Cancer in her brother, brother, and mother; Healthy in her daughter, daughter, daughter, and son; Other in her brother, brother, brother, brother, brother, and sister; Stroke in her father; Unexplained death in her sister.   Review of Systems: Please see the history of present illness.   Otherwise, the review of systems is positive for none.   All other systems are reviewed and negative.   Physical Exam: VS:  BP 140/90   Pulse (!) 57   Ht 5\' 4"  (1.626 m)   Wt 185 lb 1.9 oz (84 kg)   BMI 31.78 kg/m  .  BMI Body mass index is 31.78 kg/m.  Wt Readings from Last 3 Encounters:  03/20/16 185 lb 1.9 oz (84 kg)  03/09/16 186 lb 14.4 oz (84.8 kg)  01/28/16 185 lb 14.4 oz (84.3 kg)   BP is 138/84 by me.   General: Pleasant. Well developed, well nourished and in no acute distress.   HEENT: Normal.  Neck: Supple, no  JVD, carotid bruits, or masses noted.  Cardiac: Irregular irregular rhythm. Her rate is ok. Outflow murmur noted.  No edema.  Respiratory:  Lungs are clear to auscultation bilaterally with normal work of breathing.  GI: Soft and nontender.  MS: No deformity or atrophy. Gait and ROM intact.  Skin: Warm and dry. Color is normal.  Neuro:  Strength and sensation are intact and no gross focal deficits noted.  Psych: Alert, appropriate and with normal affect.   LABORATORY DATA:  EKG:  EKG is ordered today. This demonstrates chronic AF with a controlled VR.  Lab  Results  Component Value Date   WBC 5.9 12/13/2015   HGB 13.4 12/13/2015   HCT 41.3 12/13/2015   PLT 267 12/13/2015   GLUCOSE 76 12/13/2015   CHOL 145 12/13/2015   TRIG 81 12/13/2015   HDL 51 12/13/2015   LDLCALC 78 12/13/2015   ALT 10 12/13/2015   AST 18 12/13/2015   NA 139 12/13/2015   K 3.8 12/13/2015   CL 102 12/13/2015   CREATININE 0.61 12/13/2015   BUN 9 12/13/2015   CO2 28 12/13/2015   TSH 2.97 02/16/2015   INR 1.25 09/03/2015   HGBA1C 5.6 03/16/2014    BNP (last 3 results) No results for input(s): BNP in the last 8760 hours.  ProBNP (last 3 results) No results for input(s): PROBNP in the last 8760 hours.   Other Studies Reviewed Today:  Echo Study Conclusions from 04/2015  - Left ventricle: The cavity size was normal. There was mild focal  basal hypertrophy of the septum. Systolic function was normal.  The estimated ejection fraction was in the range of 55% to 60%.  Wall motion was normal; there were no regional wall motion  abnormalities. - Aortic valve: There was mild stenosis. - Mitral valve: There was mild regurgitation. - Left atrium: The atrium was moderately to severely dilated. - Pulmonary arteries: Systolic pressure was mildly increased. PA  peak pressure: 32 mm Hg (S).  Impressions:  - Mitral regurgitation was previously described as moderate to  severe. Images could not be  directly compared.    Assessment/Plan:  1. PAF/flutter - managed with rate control and anticoagulation - I have left her on her current regimen.   2. Chronic anticoagulation - no problems noted.  3. HTN - BP is fair on her current regimen.   4. Rheumatic heart disease with MR - recent echo from this past April was stable. Consider rechecking in April of 2019. She is doing well clinically.   5. Fatigue - seems improved. Actually wanting to return to work - ok to work 3 hours a day - she will limit her physical interactions.   Current medicines are reviewed with the patient today.  The patient does not have concerns regarding medicines other than what has been noted above.  The following changes have been made:  See above.  Labs/ tests ordered today include:   No orders of the defined types were placed in this encounter.    Disposition:   FU with me in 6 months with fasting labs.   Patient is agreeable to this plan and will call if any problems develop in the interim.   SignedNorma Fredrickson, NP  03/20/2016 10:46 AM  [facility] [address] [place], [place]  [address] Phone: [phone] Fax: [phone]

## 2016-04-04 DIAGNOSIS — R1314 Dysphagia, pharyngoesophageal phase: Secondary | ICD-10-CM | POA: Diagnosis not present

## 2016-04-04 DIAGNOSIS — K219 Gastro-esophageal reflux disease without esophagitis: Secondary | ICD-10-CM | POA: Diagnosis not present

## 2016-04-10 DIAGNOSIS — M1611 Unilateral primary osteoarthritis, right hip: Secondary | ICD-10-CM | POA: Diagnosis not present

## 2016-04-13 NOTE — Telephone Encounter (Signed)
Helen can you please close this enounter °

## 2016-05-10 ENCOUNTER — Encounter: Payer: Self-pay | Admitting: Pharmacist

## 2016-05-10 NOTE — Progress Notes (Signed)
Medication Samples have been provided to the patient.  Drug: apixaban (Eliquis) Strength: 5 mg Qty: 4 boxes LOT: [id number] Exp.Date: January 2020 Dosing instructions: 1 tablet BID  The patient has been instructed regarding the correct time, dose, and frequency of taking this medication, including desired effects and most common side effects.   Marzetta BoardJennifer Kim 3:44 PM 05/10/2016

## 2016-05-24 DIAGNOSIS — M1611 Unilateral primary osteoarthritis, right hip: Secondary | ICD-10-CM | POA: Diagnosis not present

## 2016-05-24 DIAGNOSIS — Z96653 Presence of artificial knee joint, bilateral: Secondary | ICD-10-CM | POA: Diagnosis not present

## 2016-06-07 ENCOUNTER — Ambulatory Visit: Payer: Medicare Other | Admitting: Pharmacist

## 2016-06-07 NOTE — Progress Notes (Signed)
[name] is a 76 y.o. female who reports to the clinic for monitoring of apixaban (Eliquis) therapy.    ASSESSMENT Indication(s): atrial flutter and mitral valve regurgitation (patient chose DOAC over warfarin) CHA2DS2VASC score 4, HAS-BLED 2 Duration: indefinite  Labs: Component Value Date/Time   AST 18 12/13/2015 1220   ALT 10 12/13/2015 1220   NA 139 12/13/2015 1220   NA 139 04/12/2015   K 3.8 12/13/2015 1220   CL 102 12/13/2015 1220   CO2 28 12/13/2015 1220   GLUCOSE 76 12/13/2015 1220   HGBA1C 5.6 03/16/2014 0616   BUN 9 12/13/2015 1220   BUN 10 04/12/2015   CREATININE 0.61 12/13/2015 1220   CALCIUM 9.5 12/13/2015 1220   GFRAA >60 09/03/2015 1215   GFRAA >89 05/22/2014 1202   WBC 5.9 12/13/2015 1220   HGB 13.4 12/13/2015 1220   HCT 41.3 12/13/2015 1220   PLT 267 12/13/2015 1220    apixaban (Eliquis) Dose: 5 mg BID  Safety: Patient has not had recent bleeding/thromboembolic events. Patient reports no recent signs or symptoms of bleeding, no signs of symptoms of thromboembolism. Medication changes: no.  Renal/hepatic/drug interaction concerns: no.  Adherence: Patient does correctly recite the dose. Adherence challenge including cost. Patient is unable to afford copay. Samples provided. Also provided patient with contact information for assistance with Medicare enrollment to help with Eliquis copay next year.  Patient Instructions: Patient advised to contact clinic or seek medical attention if signs/symptoms of bleeding or thromboembolism occur. Patient verbalized understanding by repeating back information.  Follow-up Due September 2018 with PCP  Marzetta BoardJennifer Eschol Auxier Clinical Pharmacist  06/07/2016, 4:10 PM

## 2016-06-13 ENCOUNTER — Encounter: Payer: Self-pay | Admitting: Pharmacist

## 2016-06-13 NOTE — Progress Notes (Signed)
Applied for tier exception on Eliquis to help with affordability for patient. Awaiting response from insurance.

## 2016-07-03 ENCOUNTER — Ambulatory Visit: Payer: Medicare Other | Admitting: Pharmacist

## 2016-07-03 DIAGNOSIS — Z79899 Other long term (current) drug therapy: Secondary | ICD-10-CM

## 2016-07-03 NOTE — Progress Notes (Signed)
Patient was not approved for Eliquis tier exception. Samples provided

## 2016-07-12 ENCOUNTER — Telehealth: Payer: Self-pay | Admitting: Pharmacist

## 2016-07-12 DIAGNOSIS — H35033 Hypertensive retinopathy, bilateral: Secondary | ICD-10-CM | POA: Diagnosis not present

## 2016-07-12 DIAGNOSIS — H25011 Cortical age-related cataract, right eye: Secondary | ICD-10-CM | POA: Diagnosis not present

## 2016-07-12 DIAGNOSIS — H40013 Open angle with borderline findings, low risk, bilateral: Secondary | ICD-10-CM | POA: Diagnosis not present

## 2016-07-12 DIAGNOSIS — N904 Leukoplakia of vulva: Secondary | ICD-10-CM

## 2016-07-12 DIAGNOSIS — Z961 Presence of intraocular lens: Secondary | ICD-10-CM | POA: Diagnosis not present

## 2016-07-12 DIAGNOSIS — K5909 Other constipation: Secondary | ICD-10-CM

## 2016-07-12 DIAGNOSIS — I10 Essential (primary) hypertension: Secondary | ICD-10-CM

## 2016-07-12 DIAGNOSIS — I099 Rheumatic heart disease, unspecified: Secondary | ICD-10-CM

## 2016-07-12 DIAGNOSIS — K219 Gastro-esophageal reflux disease without esophagitis: Secondary | ICD-10-CM

## 2016-07-12 DIAGNOSIS — I483 Typical atrial flutter: Secondary | ICD-10-CM

## 2016-07-12 MED ORDER — AMLODIPINE BESYLATE 10 MG PO TABS: 10.0000 mg | ORAL_TABLET | Freq: Every day | ORAL | 3 refills | Status: DC

## 2016-07-12 MED ORDER — CLOBETASOL PROPIONATE 0.05 % EX OINT: 1.0000 "application " | TOPICAL_OINTMENT | Freq: Every day | CUTANEOUS | 3 refills | Status: DC | PRN

## 2016-07-12 MED ORDER — APIXABAN 5 MG PO TABS
5.0000 mg | ORAL_TABLET | Freq: Two times a day (BID) | ORAL | 3 refills | Status: DC
Start: 2016-07-12 — End: 2017-07-13

## 2016-07-12 MED ORDER — PANTOPRAZOLE SODIUM 40 MG PO TBEC: 40.0000 mg | DELAYED_RELEASE_TABLET | Freq: Every day | ORAL | 3 refills | Status: DC

## 2016-07-12 MED ORDER — FUROSEMIDE 20 MG PO TABS: 20.0000 mg | ORAL_TABLET | Freq: Every day | ORAL | 3 refills | Status: DC | PRN

## 2016-07-12 MED ORDER — POLYETHYLENE GLYCOL 3350 17 G PO PACK
17.0000 g | PACK | Freq: Every day | ORAL | 3 refills | Status: DC
Start: 2016-07-12 — End: 2017-03-14

## 2016-07-12 MED ORDER — POTASSIUM CHLORIDE ER 10 MEQ PO TBCR: 10.0000 meq | EXTENDED_RELEASE_TABLET | Freq: Every day | ORAL | 3 refills | Status: DC | PRN

## 2016-07-12 NOTE — Progress Notes (Signed)
Patient requested [facility] of [facility]. Prescriptions sent

## 2016-08-16 ENCOUNTER — Ambulatory Visit (INDEPENDENT_AMBULATORY_CARE_PROVIDER_SITE_OTHER): Payer: Medicare Other | Admitting: Internal Medicine

## 2016-08-16 VITALS — BP 149/83 | HR 62 | Temp 97.5°F | Ht 64.0 in | Wt 194.4 lb

## 2016-08-16 DIAGNOSIS — I10 Essential (primary) hypertension: Secondary | ICD-10-CM | POA: Diagnosis not present

## 2016-08-16 DIAGNOSIS — K1370 Unspecified lesions of oral mucosa: Secondary | ICD-10-CM | POA: Diagnosis not present

## 2016-08-16 NOTE — Assessment & Plan Note (Signed)
Her lesion was more consistent with chronic irritation. She do not have much risk factor for head and neck cancer, except that she regularly uses alcohol-containing mouthwash which can cause oral cancer refused for prolonged periods. According to patient she was using it for more than 20 years, multiple times a day.  I advised the patient not to use those loose fitting dentures for next few weeks,she can start working on getting new dentures. She can use dentures only for eating and see if she noticed any improvement. She is also due for her follow-up with PCP during the first 2 weeks of September, she can be reevaluated during that visit. If her lesion failed to show any sign of improvement after removing that chronic irritation, she might need a biopsy.

## 2016-08-16 NOTE — Progress Notes (Signed)
CC: For evaluation of a oral mucosal lesion on her inner lower lip by her dentist.  HPI:  [name] is a 76 y.o. lady with past medical history as listed below came to the clinic at the request of her dentist to have her inner lower lip evaluated before getting a new denture. According to patient she was having loose fitted dentures for many years, getting right side of her anterior gum irritated by them,as it frequently caught between her dentures and gum. causing multiple bumps with irregular mucous membrane. She do experience pain with a new pinch, but denies any continuous pain.She denies any difficulty eating or swallowing. She is a nonsmoker, denies any radiation exposure, she drinks alcohol occasionally, but uses alcohol-containing mouthwash daily for more than 20 years. She denies any excessive warts. She denies any tobacco or betel nut chewing. She denies any change in her appetite or weight.  She was also complaining of occasional intermittent shakiness,and feeling of empty stomach, and she attributes it to her anxiety, never checked her blood sugar during those events, her events get resolved with eating.  Past Medical History:  Diagnosis Date  . Anemia 03/16/2014  . Atrial flutter (HCC) 05/07/2014   With controlled ventricular rate on no AV nodal agents.  . Chronic rheumatic heart disease 05/05/2014  . Constipation    takes Dulcolax as needed  . 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 Essential hypertension    takes Amlodipine daily  . GERD (gastroesophageal reflux disease)    takes Pantoprazole daily  . History of blood transfusion    no abnormal reaction noted  . History of migraine    last one a week  . History of shingles   . Hypertensive retinopathy of both eyes 03/07/2016   Moderate  . Mitral valve regurgitation, rheumatic 05/05/2014   Moderate to severe with peak PA  pressure of 56 mmHg on Echo 04/2014   . Muscle spasm of both lower legs    takes Zanaflex daily as needed  . Nocturia   . Osteoarthritis    knees and hands  . Peripheral edema    takes Lasix daily as needed   . PONV (postoperative nausea and vomiting)    with recent dilatation of throat, had N/V  . Systolic murmur    per patient report she has had rheumatic fever in childhood   Review of Systems:  As per HPI.  Physical Exam:  Vitals:   08/16/16 1353  BP: (!) 149/83  Pulse: 62  Temp: (!) 97.5 F (36.4 C)  TempSrc: Oral  SpO2: 100%  Weight: 194 lb 6.4 oz (88.2 kg)  Height: 5\' 4"  (1.626 m)    General: Vital signs reviewed.  Patient is well-developed and well-nourished, in no acute distress and cooperative with exam.  HEENT: Normocephalic and atraumatic.irregular bumpy area on the right side of in her lower lip close to her gum line. No ulceration. No lymphadenopathy. No pharyngeal erythema , edema or exudate.A small bumpy area was also present on right inner cheek. .    Cardiovascular: RRR, S1 normal, S2 normal, no murmurs, gallops, or rubs. Pulmonary/Chest: Clear to auscultation bilaterally, no wheezes, rales, or rhonchi. Abdominal: Soft, non-tender, non-distended, BS +, no masses, organomegaly, or guarding present.  Extremities: No lower extremity edema bilaterally,  pulses symmetric and intact bilaterally. No cyanosis or clubbing. Skin: Warm, dry and intact. No rashes or erythema. Psychiatric: Normal mood and  affect. speech and behavior is normal. Cognition and memory are normal.  Assessment & Plan:   See Encounters Tab for problem based charting.  Patient discussed with Dr. Criselda PeachesMullen.

## 2016-08-16 NOTE — Assessment & Plan Note (Signed)
BP Readings from Last 3 Encounters:  08/16/16 (!) 149/83  03/20/16 140/90  03/09/16 140/78   She was having mildly elevated blood pressure today. According to patient she is compliant with her medicine, she did not took her morning meds today before coming to clinic.  Continue with the current management, we will reassess during next follow-up visit.

## 2016-08-16 NOTE — Patient Instructions (Addendum)
Thank you for visiting clinic today. Please do not use your current dentures regularly for the next few weeks And see if your irritated area started healing. As we discussed today, we will wait few weeks to see if your lip healed itself. If it does not heal, we can think about getting a biopsy. Please do not use any mouthwash containing alcohol as it can irritate that area. Please follow-up with your PCP within a month.

## 2016-08-17 NOTE — Progress Notes (Signed)
Internal Medicine Clinic Attending  Case discussed with Dr. Nelson ChimesAmin at the time of the visit.  We reviewed the resident's history and exam and pertinent patient test results.  I agree with the assessment, diagnosis, and plan of care documented in the resident's note.  Discussed at length with Dr. Nelson ChimesAmin.  I agree that this lesion is likely due to chronic irritation. She has a paucity of risk factors and it has been there for many years per her.  The mouthwash is a not well defined risk factor, but is noted in review.  I do not think this lesion would prohibit her from getting dentures re-fitted.  It should be monitored for growth or resolution.

## 2016-08-23 ENCOUNTER — Encounter: Payer: Self-pay | Admitting: Nurse Practitioner

## 2016-09-13 ENCOUNTER — Ambulatory Visit (INDEPENDENT_AMBULATORY_CARE_PROVIDER_SITE_OTHER): Payer: Medicare Other | Admitting: Nurse Practitioner

## 2016-09-13 ENCOUNTER — Encounter: Payer: Self-pay | Admitting: Nurse Practitioner

## 2016-09-13 VITALS — BP 140/100 | HR 56 | Ht 64.0 in | Wt 191.4 lb

## 2016-09-13 DIAGNOSIS — I481 Persistent atrial fibrillation: Secondary | ICD-10-CM

## 2016-09-13 DIAGNOSIS — I099 Rheumatic heart disease, unspecified: Secondary | ICD-10-CM | POA: Diagnosis not present

## 2016-09-13 DIAGNOSIS — I4819 Other persistent atrial fibrillation: Secondary | ICD-10-CM

## 2016-09-13 DIAGNOSIS — Z7901 Long term (current) use of anticoagulants: Secondary | ICD-10-CM

## 2016-09-13 DIAGNOSIS — I10 Essential (primary) hypertension: Secondary | ICD-10-CM

## 2016-09-13 LAB — HEPATIC FUNCTION PANEL
ALT: 15 IU/L (ref 0–32)
AST: 23 IU/L (ref 0–40)
Albumin: 4.4 g/dL (ref 3.5–4.8)
Alkaline Phosphatase: 106 IU/L (ref 39–117)
Bilirubin Total: 0.5 mg/dL (ref 0.0–1.2)
Bilirubin, Direct: 0.17 mg/dL (ref 0.00–0.40)
Total Protein: 7.4 g/dL (ref 6.0–8.5)

## 2016-09-13 LAB — CBC
Hematocrit: 43.9 % (ref 34.0–46.6)
Hemoglobin: 14.8 g/dL (ref 11.1–15.9)
MCH: 29.3 pg (ref 26.6–33.0)
MCHC: 33.7 g/dL (ref 31.5–35.7)
MCV: 87 fL (ref 79–97)
Platelets: 260 10*3/uL (ref 150–379)
RBC: 5.05 x10E6/uL (ref 3.77–5.28)
RDW: 14.2 % (ref 12.3–15.4)
WBC: 6.8 10*3/uL (ref 3.4–10.8)

## 2016-09-13 LAB — BASIC METABOLIC PANEL
BUN/Creatinine Ratio: 15 (ref 12–28)
BUN: 10 mg/dL (ref 8–27)
CO2: 23 mmol/L (ref 20–29)
Calcium: 9.6 mg/dL (ref 8.7–10.3)
Chloride: 103 mmol/L (ref 96–106)
Creatinine, Ser: 0.68 mg/dL (ref 0.57–1.00)
GFR calc Af Amer: 98 mL/min/{1.73_m2} (ref >59)
GFR calc non Af Amer: 85 mL/min/{1.73_m2} (ref >59)
Glucose: 102 mg/dL — ABNORMAL HIGH (ref 65–99)
Potassium: 3.5 mmol/L (ref 3.5–5.2)
Sodium: 142 mmol/L (ref 134–144)

## 2016-09-13 LAB — LIPID PANEL
Chol/HDL Ratio: 3 ratio (ref 0.0–4.4)
Cholesterol, Total: 170 mg/dL (ref 100–199)
HDL: 57 mg/dL (ref >39)
LDL Calculated: 100 mg/dL — ABNORMAL HIGH (ref 0–99)
Triglycerides: 67 mg/dL (ref 0–149)
VLDL Cholesterol Cal: 13 mg/dL (ref 5–40)

## 2016-09-13 NOTE — Addendum Note (Signed)
Addended by: Rosalio MacadamiaGERHARDT, Isiaih Hollenbach C on: 09/13/2016 11:01 AM   Modules accepted: Orders

## 2016-09-13 NOTE — Patient Instructions (Addendum)
We will be checking the following labs today - BMET, CBC, HPF, lipids   Medication Instructions:    Continue with your current medicines.     Testing/Procedures To Be Arranged:  N/A  Follow-Up:   See me in about 6 months.     Other Special Instructions:   Monitor your BP at home - call us with an update next week.     If you need a refill on your cardiac medications before your next appointment, please call your pharmacy.   Call the [facility] office at [phone] if you have any questions, problems or concerns.

## 2016-09-13 NOTE — Progress Notes (Addendum)
CARDIOLOGY OFFICE NOTE  Date:  09/13/2016    [name] Date of Birth: 12/19/1940 Medical Record #[id number]  PCP:  Doneen PoissonKlima, Lawrence, MD  Cardiologist:  Tyrone SageGerhardt    Chief Complaint  Patient presents with  . Atrial Fibrillation    Follow up visit.     History of Present Illness: Abigail HeritageLizzie M Ellingwood is a 76 y.o. female who presents today for a follow up visit. Former patient of Dr. Yevonne PaxBrackbill's. She follows with me.   She has a history of chronic atrial fib, HTN, OA, rheumatic fever with MV regurgitation and anemia. She is on Eliquis for her anticoagulation. No beta blocker due to bradycardia.   She was found to be in atrial flutter when she presented for [facility] back on 04/13/2014.She had had a previous EKG on 03/16/14 which showed that she was in normal sinus rhythm at that time. She was cleared for the surgery and went on to have her surgical procedure without incident. She was found to have cricopharyngeal muscle hypertrophy - was treated with Botox.  I have seen her back several times - she has had both knees done in the interim. She has done well. She had stopped working. I last saw her back in March - she was doing well - actually wanting to return back to work - needed money.   Comes back today. Here with her daughter today. She is doing well. Was not able to return to work - was just too much for her but she is staying active. No chest pain. Breathing is good. BP is up here today - but no medicines yet today. She is fasting for her labs. Not dizzy or lightheaded. Cost of Eliquis is an issue - unclear if this is preferred on her formulary.   Past Medical History:  Diagnosis Date  . Anemia 03/16/2014  . Atrial flutter (HCC) 05/07/2014   With controlled ventricular rate on no AV nodal agents.  . Chronic rheumatic heart disease 05/05/2014  . Constipation    takes Dulcolax as needed  . Cricopharyngeal achalasia 04/08/2014   Rigid esophagoscopy,  dilation, and Botox injection into cricopharyngeus muscle recommended   . Essential hypertension    takes Amlodipine daily  . GERD (gastroesophageal reflux disease)    takes Pantoprazole daily  . History of blood transfusion    no abnormal reaction noted  . History of migraine    last one a week  . History of shingles   . Hypertensive retinopathy of both eyes 03/07/2016   Moderate  . Mitral valve regurgitation, rheumatic 05/05/2014   Moderate to severe with peak PA pressure of 56 mmHg on Echo 04/2014   . Muscle spasm of both lower legs    takes Zanaflex daily as needed  . Nocturia   . Osteoarthritis    knees and hands  . Peripheral edema    takes Lasix daily as needed   . PONV (postoperative nausea and vomiting)    with recent dilatation of throat, had N/V  . Systolic murmur    per patient report she has had rheumatic fever in childhood    Past Surgical History:  Procedure Laterality Date  . ABDOMINAL HYSTERECTOMY     Fibroids  . COLONOSCOPY W/ BIOPSIES AND POLYPECTOMY    . CYST EXCISION Right    on hand  . ESOPHAGOSCOPY WITH DILITATION N/A 04/14/2014   Procedure: RIGID ESOPHAGOSCOPY WITH DILITATION AND BOTOX  INJECTION;  Surgeon: Osborn Cohoavid Shoemaker,  MD;  Location: [facility] OR;  Service: ENT;  Laterality: N/A;  . MULTIPLE TOOTH EXTRACTIONS    . TOTAL KNEE ARTHROPLASTY Right 04/06/2015  . TOTAL KNEE ARTHROPLASTY Right 04/06/2015   Procedure: RIGHT TOTAL KNEE ARTHROPLASTY;  Surgeon: Marcene Corning, MD;  Location: [facility] OR;  Service: Orthopedics;  Laterality: Right;  . TOTAL KNEE ARTHROPLASTY Left 09/14/2015   Procedure: TOTAL KNEE ARTHROPLASTY LEFT;  Surgeon: Marcene Corning, MD;  [facility];  Service: Orthopedics;  Laterality: Left;     Medications: Current Meds  Medication Sig  . amLODipine (NORVASC) 10 MG tablet Take 1 tablet (10 mg total) by mouth daily.  [name] apixaban (ELIQUIS) 5 MG TABS tablet Take 1 tablet (5 mg total) by mouth 2 (two) times daily.  . bisacodyl (DULCOLAX) 5 MG EC  tablet Take 10 mg by mouth daily as needed for moderate constipation.   . clobetasol ointment (TEMOVATE) 0.05 % Apply 1 application topically daily as needed (for skin irritation).  . furosemide (LASIX) 20 MG tablet Take 1 tablet (20 mg total) by mouth daily as needed for edema.  . pantoprazole (PROTONIX) 40 MG tablet Take 1 tablet (40 mg total) by mouth daily.  . polyethylene glycol (MIRALAX / GLYCOLAX) packet Take 17 g by mouth daily.  . potassium chloride (K-DUR) 10 MEQ tablet Take 1 tablet (10 mEq total) by mouth daily as needed (When lasix is given prn for fluid retention).  . traMADol (ULTRAM) 50 MG tablet Take 50-100 mg by mouth every 8 (eight) hours.      Allergies: Allergies  Allergen Reactions  . Ace Inhibitors Cough    Social History: The patient  reports that she has never smoked. She has never used smokeless tobacco. She reports that she does not drink alcohol or use drugs.   Family History: The patient's family history includes Asthma in her sister; Cancer in her brother, brother, and mother; Healthy in her daughter, daughter, daughter, and son; Other in her brother, brother, brother, brother, brother, and sister; Stroke in her father; Unexplained death in her sister.   Review of Systems: Please see the history of present illness.   Otherwise, the review of systems is positive for none.   All other systems are reviewed and negative.   Physical Exam: VS:  BP (!) 140/100 (BP Location: Left Arm, Patient Position: Sitting, Cuff Size: Normal)   Pulse (!) 56   Ht  (1.626 m)   Wt 191 lb 6.4 oz (86.8 kg)   BMI 32.85 kg/m  .  BMI Body mass index is 32.85 kg/m.  Wt Readings from Last 3 Encounters:  09/13/16 191 lb 6.4 oz (86.8 kg)  08/16/16 194 lb 6.4 oz (88.2 kg)  03/20/16 185 lb 1.9 oz (84 kg)    General: Pleasant. Elderly female. She is quite pleasant, alert and in no acute distress.   HEENT: Normal.  Neck: Supple, no JVD, carotid bruits, or masses noted.    Cardiac: Fairly regular rhythm today. Rate is ok. Soft outflow murmur. No edema.  Respiratory:  Lungs are clear to auscultation bilaterally with normal work of breathing.  GI: Soft and nontender.  MS: No deformity or atrophy. Gait and ROM intact.  Skin: Warm and dry. Color is normal.  Neuro:  Strength and sensation are intact and no gross focal deficits noted.  Psych: Alert, appropriate and with normal affect.   LABORATORY DATA:  EKG:  EKG is not ordered today.  Lab Results  Component Value Date   WBC 5.9  12/13/2015   HGB 13.4 12/13/2015   HCT 41.3 12/13/2015   PLT 267 12/13/2015   GLUCOSE 76 12/13/2015   CHOL 145 12/13/2015   TRIG 81 12/13/2015   HDL 51 12/13/2015   LDLCALC 78 12/13/2015   ALT 10 12/13/2015   AST 18 12/13/2015   NA 139 12/13/2015   K 3.8 12/13/2015   CL 102 12/13/2015   CREATININE 0.61 12/13/2015   BUN 9 12/13/2015   CO2 28 12/13/2015   TSH 2.97 02/16/2015   INR 1.25 09/03/2015   HGBA1C 5.6 03/16/2014     BNP (last 3 results) No results for input(s): BNP in the last 8760 hours.  ProBNP (last 3 results) No results for input(s): PROBNP in the last 8760 hours.   Other Studies Reviewed Today:  Echo Study Conclusions from 04/2015  - Left ventricle: The cavity size was normal. There was mild focal  basal hypertrophy of the septum. Systolic function was normal.  The estimated ejection fraction was in the range of 55% to 60%.  Wall motion was normal; there were no regional wall motion  abnormalities. - Aortic valve: There was mild stenosis. - Mitral valve: There was mild regurgitation. - Left atrium: The atrium was moderately to severely dilated. - Pulmonary arteries: Systolic pressure was mildly increased. PA  peak pressure: 32 mm Hg (S).  Impressions:  - Mitral regurgitation was previously described as moderate to  severe. Images could not be directly compared.    Assessment/Plan:  1. PAF/flutter - managed with rate  control and anticoagulation - I have left her on her current regimen. She is doing well.   2. Chronic anticoagulation - no problems noted. Lab today.   3. HTN - BP is up here today - I have asked her to monitor and call us with an update next week.  4. Rheumatic heart disease with MR - recent echo from this past April was stable. Consider rechecking in April of 2019. She is doing well clinically.    Current medicines are reviewed with the patient today.  The patient does not have concerns regarding medicines other than what has been noted above.  The following changes have been made:  See above.  Labs/ tests ordered today include:    Orders Placed This Encounter  Procedures  . Basic metabolic panel  . CBC  . Hepatic function panel  . Lipid panel     Disposition:   FU with me in 6 months.   Patient is agreeable to this plan and will call if any problems develop in the interim.   SignedNorma Fredrickson, NP  09/13/2016 11:01 AM  [facility] [address] [place], [place]  [address] Phone: [phone] Fax: [phone]

## 2016-09-27 DIAGNOSIS — Z96652 Presence of left artificial knee joint: Secondary | ICD-10-CM | POA: Diagnosis not present

## 2016-09-27 DIAGNOSIS — M25562 Pain in left knee: Secondary | ICD-10-CM | POA: Diagnosis not present

## 2016-09-27 DIAGNOSIS — M25561 Pain in right knee: Secondary | ICD-10-CM | POA: Diagnosis not present

## 2016-09-27 DIAGNOSIS — Z96651 Presence of right artificial knee joint: Secondary | ICD-10-CM | POA: Diagnosis not present

## 2016-10-06 ENCOUNTER — Ambulatory Visit (INDEPENDENT_AMBULATORY_CARE_PROVIDER_SITE_OTHER): Payer: Medicare Other | Admitting: Internal Medicine

## 2016-10-06 ENCOUNTER — Encounter: Payer: Self-pay | Admitting: Internal Medicine

## 2016-10-06 VITALS — BP 145/89 | HR 80 | Temp 98.6°F | Wt 195.0 lb

## 2016-10-06 DIAGNOSIS — M159 Polyosteoarthritis, unspecified: Secondary | ICD-10-CM

## 2016-10-06 DIAGNOSIS — K5909 Other constipation: Secondary | ICD-10-CM | POA: Diagnosis not present

## 2016-10-06 DIAGNOSIS — M810 Age-related osteoporosis without current pathological fracture: Secondary | ICD-10-CM

## 2016-10-06 DIAGNOSIS — M150 Primary generalized (osteo)arthritis: Secondary | ICD-10-CM

## 2016-10-06 DIAGNOSIS — I483 Typical atrial flutter: Secondary | ICD-10-CM | POA: Diagnosis not present

## 2016-10-06 DIAGNOSIS — Z6834 Body mass index (BMI) 34.0-34.9, adult: Secondary | ICD-10-CM

## 2016-10-06 DIAGNOSIS — Z23 Encounter for immunization: Secondary | ICD-10-CM

## 2016-10-06 DIAGNOSIS — I10 Essential (primary) hypertension: Secondary | ICD-10-CM | POA: Diagnosis not present

## 2016-10-06 DIAGNOSIS — E669 Obesity, unspecified: Secondary | ICD-10-CM | POA: Diagnosis not present

## 2016-10-06 DIAGNOSIS — M8949 Other hypertrophic osteoarthropathy, multiple sites: Secondary | ICD-10-CM

## 2016-10-06 MED ORDER — TIZANIDINE HCL 2 MG PO TABS: 2.0000 mg | ORAL_TABLET | Freq: Three times a day (TID) | ORAL | 11 refills | Status: DC | PRN

## 2016-10-06 MED ORDER — BISACODYL 5 MG PO TBEC: 10.0000 mg | DELAYED_RELEASE_TABLET | Freq: Every day | ORAL | 3 refills | Status: DC | PRN

## 2016-10-06 NOTE — Assessment & Plan Note (Signed)
Assessment  She has been asymptomatic without palpitations or dizziness. Her ventricular rate is well controlled on no AV nodal agents suggesting an underlying conduction system abnormality. She will have an occasional skipped beat on examination, but otherwise appears rate controlled and regular. She is tolerating the Eliquis well but is having difficulty affording it.  Plan  We will ask Dr. Selena Batten to continue to help with any patient assistance programs she is able to obtain for [name] to continue her Eliquis which she prefers over warfarin. She was given several samples during this visit as she is scheduled to run out next week and continues to have difficulty affording the medication. We will reassess the control of her atrial flutter clinically at the follow-up visit.

## 2016-10-06 NOTE — Assessment & Plan Note (Signed)
She was given the flu vaccination today. She is otherwise up-to-date on her health care maintenance.

## 2016-10-06 NOTE — Assessment & Plan Note (Signed)
Assessment  Her constipation has worsened recently. She is taking the MiraLAX but has not been taking the Dulcolax. She feels the constipation probably worsened when she stopped taking the Dulcolax.  Plan  She was asked to continue the MiraLAX and restart the Dulcolax as needed for constipation. A new prescription for the Dulcolax was sent to her pharmacy. We will reassess the efficacy of the MiraLAX and as needed Dulcolax in controlling her constipation symptoms at the follow-up visit.

## 2016-10-06 NOTE — Assessment & Plan Note (Signed)
Assessment  Unfortunately she is up 4 pounds since the last clinic visit.  Plan  I stressed the importance of remaining active for several health benefits including weight loss. We will reassess her weight at the follow-up visit and continue to encourage increased activity.

## 2016-10-06 NOTE — Assessment & Plan Note (Signed)
Assessment  Her knees feel much improved after her bilateral arthroplasties. Her arthralgias are responding to as needed Tylenol.  Plan  We will continue the as needed acetaminophen for her arthralgias. Given the bilateral knee replacements she was encouraged to remain active. We will reassess her arthralgias at the follow-up visit.

## 2016-10-06 NOTE — Assessment & Plan Note (Signed)
Assessment  Her systolic blood pressure is a little higher than target at 145/89. This is on amlodipine 10 mg by mouth daily.  Plan  We will continue the amlodipine at 10 mg by mouth daily and reassess the blood pressure at follow-up visit. If she continues to do well from a lower extremity swelling standpoint on the as needed Lasix we will consider switching her to hydrochlorothiazide 25 mg by mouth daily to address both the blood pressure, if it remains elevated, as well as any mild lower extremity swelling.

## 2016-10-06 NOTE — Patient Instructions (Addendum)
It was good to see you again.  It has been a long time.  You are doing a nice job with your health.  1) Keep taking the medications as you are.  2) I reordered the dulcolax 2 tablets as needed daily for constipation.  You can take this with the miralax packets.  3) I will order a DEXA scan to make sure your bone strength remains good off of the medication for the osteoporosis.  4) We gave you the flu shot today.  5) We will continue to work on trying to keep the Eliquis affordable.  6) You can take tylenol safely for your stiff neck.  I will see you back in 6 months, sooner if necessary.

## 2016-10-06 NOTE — Progress Notes (Signed)
   Subjective:    Patient ID: [name], female    DOB: 1940-08-26, 76 y.o.   MRN: [id number]  HPI  LATECIA [name] is here for follow-up of her essential hypertension, osteoarthritis s/p bilateral knee arthroplasties, post-menopausal osteoporosis, lichen sclerosis of genitalia, atrial flutter, and gastroesophageal reflux disease. Please see the A&P for the status of the pt's chronic medical problems.  Review of Systems  Constitutional: Negative for activity change, appetite change and unexpected weight change.  Respiratory: Negative for chest tightness, shortness of breath and wheezing.   Cardiovascular: Negative for chest pain, palpitations and leg swelling.  Gastrointestinal: Positive for constipation. Negative for abdominal pain, diarrhea, nausea and vomiting.  Musculoskeletal: Positive for neck stiffness. Negative for arthralgias, gait problem, joint swelling and myalgias.  Skin: Negative for rash and wound.      Objective:   Physical Exam  Constitutional: She is oriented to person, place, and time. She appears well-developed and well-nourished. No distress.  HENT:  Head: Normocephalic and atraumatic.  Eyes: Conjunctivae are normal. Right eye exhibits no discharge. Left eye exhibits no discharge. No scleral icterus.  Neck: Normal range of motion. Neck supple. No tracheal deviation present. No thyromegaly present.  Cardiovascular: Normal rate and regular rhythm.  Exam reveals no gallop and no friction rub.   Murmur heard. Pulmonary/Chest: Effort normal and breath sounds normal. No stridor. No respiratory distress. She has no wheezes. She has no rales.  Abdominal: Soft. Bowel sounds are normal. She exhibits no distension. There is no tenderness. There is no rebound and no guarding.  Musculoskeletal: Normal range of motion. She exhibits no edema or tenderness.  Lymphadenopathy:    She has no cervical adenopathy.  Neurological: She is alert and oriented to person, place, and time.  She exhibits normal muscle tone.  Skin: Skin is warm and dry. No rash noted. She is not diaphoretic. No erythema.  Psychiatric: She has a normal mood and affect. Her behavior is normal. Judgment and thought content normal.  Nursing note and vitals reviewed.     Assessment & Plan:   Please see problem oriented charting.

## 2016-10-19 ENCOUNTER — Other Ambulatory Visit: Payer: Self-pay | Admitting: Internal Medicine

## 2016-10-19 DIAGNOSIS — M810 Age-related osteoporosis without current pathological fracture: Secondary | ICD-10-CM

## 2016-10-27 ENCOUNTER — Other Ambulatory Visit: Payer: Self-pay | Admitting: Internal Medicine

## 2016-10-27 DIAGNOSIS — Z1231 Encounter for screening mammogram for malignant neoplasm of breast: Secondary | ICD-10-CM

## 2016-11-02 DIAGNOSIS — H02403 Unspecified ptosis of bilateral eyelids: Secondary | ICD-10-CM | POA: Diagnosis not present

## 2016-11-02 DIAGNOSIS — H40013 Open angle with borderline findings, low risk, bilateral: Secondary | ICD-10-CM | POA: Diagnosis not present

## 2016-11-02 DIAGNOSIS — H40031 Anatomical narrow angle, right eye: Secondary | ICD-10-CM | POA: Diagnosis not present

## 2016-11-02 DIAGNOSIS — H1851 Endothelial corneal dystrophy: Secondary | ICD-10-CM | POA: Diagnosis not present

## 2016-11-07 ENCOUNTER — Encounter: Payer: Self-pay | Admitting: Internal Medicine

## 2016-11-07 ENCOUNTER — Ambulatory Visit (INDEPENDENT_AMBULATORY_CARE_PROVIDER_SITE_OTHER): Payer: Medicare Other | Admitting: Internal Medicine

## 2016-11-07 DIAGNOSIS — M545 Low back pain: Secondary | ICD-10-CM | POA: Diagnosis not present

## 2016-11-07 DIAGNOSIS — M25551 Pain in right hip: Secondary | ICD-10-CM

## 2016-11-07 DIAGNOSIS — K5909 Other constipation: Secondary | ICD-10-CM

## 2016-11-07 NOTE — Progress Notes (Signed)
   CC: right hip/back pain  HPI:  [name] is a 76 y.o. presented to the clinic today for a one-month history of right posterior hip/back pain.  She states that she initially felt as if this pain was related to her chronic constipation her previous most recent clinic visit.  As such she did not discuss the pain at that time.  In addition the patient and attested to having lifted her patient into and out of the tub multiple times and bending twisting motion prior to experiencing this pain.  In addition she ends that following the floating in the area she recently spent an extended period of time cleaning her home using a shovel in a similar manner.  Both of these events occurred approximately the same time prior to initiation of her right sided pain.  She denies pain radiating down her leg, loss of bowel control, loss of bladder control, numbness or tingling, rash or irritation of the skin, trauma, fall of any sort, dizziness, lightheadedness, hematuria, dysuria, abdominal pain, or groin pain.  Past Medical History:  Diagnosis Date  . Anemia 03/16/2014  . Atrial flutter (HCC) 05/07/2014   With controlled ventricular rate on no AV nodal agents.  . Chronic rheumatic heart disease 05/05/2014  . Constipation    takes Dulcolax as needed  . 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 Essential hypertension    takes Amlodipine daily  . GERD (gastroesophageal reflux disease)    takes Pantoprazole daily  . History of blood transfusion    no abnormal reaction noted  . History of migraine    last one a week  . History of shingles   . Hypertensive retinopathy of both eyes 03/07/2016   Moderate  . Mitral valve regurgitation, rheumatic 05/05/2014   Moderate to severe with peak PA pressure of 56 mmHg on Echo 04/2014   . Muscle spasm of both lower legs    takes Zanaflex daily as needed  . Nocturia   .  Osteoarthritis    knees and hands  . Peripheral edema    takes Lasix daily as needed   . PONV (postoperative nausea and vomiting)    with recent dilatation of throat, had N/V  . Systolic murmur    per patient report she has had rheumatic fever in childhood   Review of Systems:  ROS  Negative except as per HPI.  Physical Exam:  Vitals:   11/07/16 0924  BP: 140/74  Pulse: 70  Temp: 97.8 F (36.6 C)  TempSrc: Oral  SpO2: 99%  Weight: 197 lb 1.6 oz (89.4 kg)  Height: 5\' 4"  (1.626 m)   Physical Exam  Constitutional: She appears well-developed and well-nourished. No distress.  Cardiovascular: Normal rate and regular rhythm.  Pulmonary/Chest: Effort normal and breath sounds normal. No respiratory distress.  Abdominal: Soft. Bowel sounds are normal. She exhibits no distension. There is no tenderness.  Musculoskeletal: She exhibits tenderness (Severe 8/10 tenderness to palpation approximately midway between the spine and the right anterior superior iliac crest on a horizontal line.  This pain is reproducible with Trendelenburg gait test when lifting the contralateral leg(left)).  Neurological: She is alert.  Skin: Skin is warm.  Psychiatric: She has a normal mood and affect.  Nursing note and vitals reviewed.   Assessment & Plan:   See Encounters Tab for problem based charting.  Patient seen with Dr. Oswaldo DoneVincent

## 2016-11-07 NOTE — Assessment & Plan Note (Addendum)
Assessment: HPI for details Patient presents with right-sided lumbar/right hip pain. Severe tenderness to palpation approximately inferior to the posterior iliac crest overlying the approximate location of the piriformis and medial gluteus muscles. Given her presentation of this pain secondary to multiple strenuous events to the area, she is most likely suffering from musculoskeletal strain.  Negative Trendelenburg gait sign further reinforces the aspect of this as a mild injury.  Plan: Recommended ibuprofen 600 mg 3 times daily for no more than 5 days. Advised the patient to use heat as needed for pain and inflammation Advised the patient to rest the muscles in question Further advised patient to call in a return to the clinic if pain worsens.

## 2016-11-07 NOTE — Patient Instructions (Signed)
At this time I would recommend using a heating pad as needed for the muscle pain as well as ibuprofen 600mg . You may purchase over the counter ibuprofen (motrin) at any pharmacy and take 3 200mg  tablets for a total of 600mg  3 times a day for no more than 5 days.   As we discussed, I would recommend that you continue taking her laxative and greatly increase your vegetable intake to help treat your constipation at this time.  [facility].

## 2016-11-08 NOTE — Progress Notes (Signed)
Internal Medicine Clinic Attending  I saw and evaluated the patient.  I personally confirmed the key portions of the history and exam documented by Dr. Harbrecht and I reviewed pertinent patient test results.  The assessment, diagnosis, and plan were formulated together and I agree with the documentation in the resident's note.  

## 2016-11-27 ENCOUNTER — Ambulatory Visit
Admission: RE | Admit: 2016-11-27 | Discharge: 2016-11-27 | Disposition: A | Discharge status: Home / Self Care | Payer: Medicare Other | Source: Ambulatory Visit | Attending: Internal Medicine | Admitting: Internal Medicine

## 2016-11-27 DIAGNOSIS — Z780 Asymptomatic menopausal state: Secondary | ICD-10-CM | POA: Diagnosis not present

## 2016-11-27 DIAGNOSIS — Z1231 Encounter for screening mammogram for malignant neoplasm of breast: Secondary | ICD-10-CM

## 2016-11-27 DIAGNOSIS — M810 Age-related osteoporosis without current pathological fracture: Secondary | ICD-10-CM

## 2017-01-17 ENCOUNTER — Telehealth: Payer: Self-pay | Admitting: Internal Medicine

## 2017-01-17 NOTE — Telephone Encounter (Signed)
I called [name] to discuss the results of her recent DEXA scan after comparing them to her previous DEXA scans.  She is now 2 year s/p a bisphosphonate holiday.  Unfortunately, her left hip T score dropped from -2.1 in 2016  to -3.0.  This suggests she may benefit from a restart of her bisphosphonate therapy (as well as 1200 mg/d of calcium and 800 IU/d of vitamin D).  Medications continue to be difficult for her to afford.  She is scheduled to see me on 04/13/2017 at 11 AM and we decided to see if she may be eligible for a medication assistance program prior to prescribing a medication she is unable to afford.

## 2017-01-19 ENCOUNTER — Other Ambulatory Visit: Payer: Self-pay | Admitting: Pharmacist

## 2017-01-19 DIAGNOSIS — M810 Age-related osteoporosis without current pathological fracture: Secondary | ICD-10-CM

## 2017-01-19 MED ORDER — ALENDRONATE SODIUM 70 MG PO TABS: 70.0000 mg | ORAL_TABLET | ORAL | 11 refills | Status: DC

## 2017-01-19 NOTE — Progress Notes (Addendum)
Discussed w/ PCP initiation of alendronate 70 mg weekly. [facility] a $4 copay for alendronate (Fosamax).

## 2017-01-25 ENCOUNTER — Telehealth: Payer: Self-pay | Admitting: Pharmacist

## 2017-01-25 DIAGNOSIS — N904 Leukoplakia of vulva: Secondary | ICD-10-CM

## 2017-01-25 MED ORDER — FLUOCINONIDE 0.05 % EX OINT: 1.0000 "application " | TOPICAL_OINTMENT | Freq: Two times a day (BID) | CUTANEOUS | 3 refills | Status: DC

## 2017-01-25 MED ORDER — CLOBETASOL PROPIONATE 0.05 % EX OINT: 1.0000 "application " | TOPICAL_OINTMENT | Freq: Every day | CUTANEOUS | 3 refills | Status: DC | PRN

## 2017-01-25 MED ORDER — BETAMETHASONE DIPROPIONATE AUG 0.05 % EX OINT: TOPICAL_OINTMENT | Freq: Two times a day (BID) | CUTANEOUS | 0 refills | Status: DC

## 2017-01-25 NOTE — Telephone Encounter (Signed)
Yes. Thank you very much!

## 2017-01-25 NOTE — Addendum Note (Signed)
Addended by: Mliss FritzKIM, JENNIFER J on: 01/25/2017 12:45 PM   Modules accepted: Orders

## 2017-02-07 ENCOUNTER — Telehealth: Payer: Self-pay | Admitting: *Deleted

## 2017-02-07 NOTE — Telephone Encounter (Signed)
Pt presented to triage asking for some eliquis due to cost, gave to boxes 5mg  #28 to pt, sending to dr kim for future assist

## 2017-02-14 ENCOUNTER — Telehealth: Payer: Self-pay

## 2017-02-14 NOTE — Telephone Encounter (Signed)
Needs to speak with a nurse about meds.  

## 2017-02-14 NOTE — Telephone Encounter (Signed)
Wants someone to look at her social security letter and her light bill, she doesn't understand them she will bring them to clinic 2/14 and ask for debh.

## 2017-02-15 ENCOUNTER — Other Ambulatory Visit: Payer: Self-pay | Admitting: *Deleted

## 2017-02-15 DIAGNOSIS — B373 Candidiasis of vulva and vagina: Secondary | ICD-10-CM

## 2017-02-15 DIAGNOSIS — B3731 Acute candidiasis of vulva and vagina: Secondary | ICD-10-CM

## 2017-03-14 ENCOUNTER — Ambulatory Visit: Payer: Medicare Other | Admitting: Nurse Practitioner

## 2017-03-14 ENCOUNTER — Encounter: Payer: Self-pay | Admitting: Nurse Practitioner

## 2017-03-14 VITALS — BP 154/82 | HR 79 | Ht 64.0 in | Wt 193.4 lb

## 2017-03-14 DIAGNOSIS — Z79899 Other long term (current) drug therapy: Secondary | ICD-10-CM | POA: Diagnosis not present

## 2017-03-14 DIAGNOSIS — I051 Rheumatic mitral insufficiency: Secondary | ICD-10-CM | POA: Diagnosis not present

## 2017-03-14 DIAGNOSIS — I481 Persistent atrial fibrillation: Secondary | ICD-10-CM | POA: Diagnosis not present

## 2017-03-14 DIAGNOSIS — I4819 Other persistent atrial fibrillation: Secondary | ICD-10-CM

## 2017-03-14 NOTE — Patient Instructions (Addendum)
We will be checking the following labs today - BMET, CBC, HPF and lipids   Medication Instructions:    Continue with your current medicines.   Samples of Eliquis    Testing/Procedures To Be Arranged:  Echocardiogram  Follow-Up:   See me in 6 months    Other Special Instructions:   N/A    If you need a refill on your cardiac medications before your next appointment, please call your pharmacy.   Call the [facility] office at [phone] if you have any questions, problems or concerns.

## 2017-03-14 NOTE — Progress Notes (Signed)
CARDIOLOGY OFFICE NOTE  Date:  03/14/2017    [name] Date of Birth: October 30, 1940 Medical Record #[id number]  PCP:  Doneen Poisson, MD  Cardiologist:  Tyrone Sage     Chief Complaint  Patient presents with  . Atrial Fibrillation    6 month check.     History of Present Illness: [name] is a 77 y.o. female who presents today for a follow up visit. Former patient of Dr. Yevonne Pax. She follows with me.   She has a history of chronic atrial fib, HTN, OA, rheumatic fever with MV regurgitation and anemia. She is on Eliquis for her anticoagulation. No beta blocker due to bradycardia.   She was found to be in atrial flutter when she presented for a surgical procedure at [facility] back on 04/13/2014.She had had a previous EKG on 03/16/14 which showed that she was in normal sinus rhythm at that time. She was cleared for the surgery and went on to have her surgical procedure without incident. She was found to have cricopharyngeal muscle hypertrophy - was treated with Botox.  I have seen her back several times - she has had both knees done in the interim. She has done well. She hadstopped working. I last saw her back in September and she was doing ok.   Comes back today. Here alone. She feels like she is doing ok. Not really short of breath. No chest pain. Swelling is stable. No awareness of her heart rhythm. Doing ok on Eliquis just expensive. No bleeding/bruising. 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 She wishes she could find some type of part time job to help get her out of the house.   Past Medical History:  Diagnosis Date  . Anemia 03/16/2014  . Atrial flutter (HCC) 05/07/2014   With controlled ventricular rate on no AV nodal agents.  . Chronic rheumatic heart disease 05/05/2014  . Constipation    takes Dulcolax as needed  . Cricopharyngeal achalasia 04/08/2014   Rigid esophagoscopy, dilation, and Botox injection into  cricopharyngeus muscle recommended   . Essential hypertension    takes Amlodipine daily  . GERD (gastroesophageal reflux disease)    takes Pantoprazole daily  . History of blood transfusion    no abnormal reaction noted  . History of migraine    last one a week  . History of shingles   . Hypertensive retinopathy of both eyes 03/07/2016   Moderate  . Mitral valve regurgitation, rheumatic 05/05/2014   Moderate to severe with peak PA pressure of 56 mmHg on Echo 04/2014   . Muscle spasm of both lower legs    takes Zanaflex daily as needed  . Nocturia   . Osteoarthritis    knees and hands  . Peripheral edema    takes Lasix daily as needed   . PONV (postoperative nausea and vomiting)    with recent dilatation of throat, had N/V  . Systolic murmur    per patient report she has had rheumatic fever in childhood    Past Surgical History:  Procedure Laterality Date  . ABDOMINAL HYSTERECTOMY     Fibroids  . COLONOSCOPY W/ BIOPSIES AND POLYPECTOMY    . CYST EXCISION Right    on hand  . ESOPHAGOSCOPY WITH DILITATION N/A 04/14/2014   Procedure: RIGID ESOPHAGOSCOPY WITH DILITATION AND BOTOX  INJECTION;  Surgeon: Osborn Coho, MD;  Location: [facility] OR;  Service: ENT;  Laterality: N/A;  . MULTIPLE TOOTH  EXTRACTIONS    . TOTAL KNEE ARTHROPLASTY Right 04/06/2015  . TOTAL KNEE ARTHROPLASTY Right 04/06/2015   Procedure: RIGHT TOTAL KNEE ARTHROPLASTY;  Surgeon: Marcene CorningPeter Dalldorf, MD;  Location: [facility] OR;  Service: Orthopedics;  Laterality: Right;  . TOTAL KNEE ARTHROPLASTY Left 09/14/2015   Procedure: TOTAL KNEE ARTHROPLASTY LEFT;  Surgeon: Marcene CorningPeter Dalldorf, MD;  [facility];  Service: Orthopedics;  Laterality: Left;     Medications: Current Meds  Medication Sig  . alendronate (FOSAMAX) 70 MG tablet Take 1 tablet (70 mg total) by mouth once a week. Take with a full glass of water on an empty stomach.  [name]. amLODipine (NORVASC) 10 MG tablet Take 1 tablet (10 mg total) by mouth daily.  [name]. apixaban (ELIQUIS) 5  MG TABS tablet Take 1 tablet (5 mg total) by mouth 2 (two) times daily.  [name]. augmented betamethasone dipropionate (DIPROLENE-AF) 0.05 % ointment Apply topically 2 (two) times daily.  . bisacodyl (DULCOLAX) 5 MG EC tablet Take 2 tablets (10 mg total) by mouth daily as needed for moderate constipation.  . clobetasol ointment (TEMOVATE) 0.05 % Apply 1 application topically daily as needed (for skin irritation).  . furosemide (LASIX) 20 MG tablet Take 1 tablet (20 mg total) by mouth daily as needed for edema.  . pantoprazole (PROTONIX) 40 MG tablet Take 1 tablet (40 mg total) by mouth daily.  . potassium chloride (K-DUR) 10 MEQ tablet Take 1 tablet (10 mEq total) by mouth daily as needed (When lasix is given prn for fluid retention).  [name]. tiZANidine (ZANAFLEX) 2 MG tablet Take 1 tablet (2 mg total) by mouth every 8 (eight) hours as needed for muscle spasms.  . traMADol (ULTRAM) 50 MG tablet Take 50-100 mg by mouth every 8 (eight) hours.   . [DISCONTINUED] polyethylene glycol (MIRALAX / GLYCOLAX) packet Take 17 g by mouth daily.     Allergies: Allergies  Allergen Reactions  . Ace Inhibitors Cough    Social History: The patient  reports that  has never smoked. she has never used smokeless tobacco. She reports that she does not drink alcohol or use drugs.   Family History: The patient's family history includes Asthma in her sister; Cancer in her brother, brother, and mother; Healthy in her daughter, daughter, daughter, and son; Other in her brother, brother, brother, brother, brother, and sister; Stroke in her father; Unexplained death in her sister.   Review of Systems: Please see the history of present illness.   Otherwise, the review of systems is positive for none.   All other systems are reviewed and negative.   Physical Exam: VS:  BP (!) 154/82   Pulse 79   Ht 5\' 4"  (1.626 m)   Wt 193 lb 6.4 oz (87.7 kg)   BMI 33.20 kg/m  .  BMI Body mass index is 33.2 kg/m.  Wt Readings from Last 3  Encounters:  03/14/17 193 lb 6.4 oz (87.7 kg)  11/07/16 197 lb 1.6 oz (89.4 kg)  10/06/16 195 lb (88.5 kg)   Repeat BP by me is 142/80.  General: Pleasant. Alert and in no acute distress.   HEENT: Normal.  Neck: Supple, no JVD, carotid bruits, or masses noted.  Cardiac: Irregular irregular rhythm. Rate is ok.  Systolic murmur noted. Trace edema.  Respiratory:  Lungs are clear to auscultation bilaterally with normal work of breathing.  GI: Soft and nontender.  MS: No deformity or atrophy. Gait and ROM intact.  Skin: Warm and dry. Color is normal.  Neuro:  Strength and sensation are intact and no gross focal deficits noted.  Psych: Alert, appropriate and with normal affect.   LABORATORY DATA:  EKG:  EKG is ordered today. This demonstrates AF/AFL with controlled VR.  Lab Results  Component Value Date   WBC 6.8 09/13/2016   HGB 14.8 09/13/2016   HCT 43.9 09/13/2016   PLT 260 09/13/2016   GLUCOSE 102 (H) 09/13/2016   CHOL 170 09/13/2016   TRIG 67 09/13/2016   HDL 57 09/13/2016   LDLCALC 100 (H) 09/13/2016   ALT 15 09/13/2016   AST 23 09/13/2016   NA 142 09/13/2016   K 3.5 09/13/2016   CL 103 09/13/2016   CREATININE 0.68 09/13/2016   BUN 10 09/13/2016   CO2 23 09/13/2016   TSH 2.97 02/16/2015   INR 1.25 09/03/2015   HGBA1C 5.6 03/16/2014     BNP (last 3 results) No results for input(s): BNP in the last 8760 hours.  ProBNP (last 3 results) No results for input(s): PROBNP in the last 8760 hours.   Other Studies Reviewed Today:  Echo Study Conclusions from 04/2015  - Left ventricle: The cavity size was normal. There was mild focal  basal hypertrophy of the septum. Systolic function was normal.  The estimated ejection fraction was in the range of 55% to 60%.  Wall motion was normal; there were no regional wall motion  abnormalities. - Aortic valve: There was mild stenosis. - Mitral valve: There was mild regurgitation. - Left atrium: The atrium was  moderately to severely dilated. - Pulmonary arteries: Systolic pressure was mildly increased. PA  peak pressure: 32 mm Hg (S).  Impressions:  - Mitral regurgitation was previously described as moderate to  severe. Images could not be directly compared.    Assessment/Plan:  1. PAF/flutter - persistent - she has been managed with rate control and anticoagulation - I have left her on her current regimen. She is doing well.   2. Chronic anticoagulation - no problems noted. Lab today. No reports of bleeding/bruising. Samples today.   3. HTN - recheck by me is ok - I have left her on her current regimen for now.   4. Rheumatic heart disease with MR - will get her echo updated. She looks good clinically.   5. Situational stress - sad situation at home - she is the primary care giver for her husband who has lots of issues. She is wanting to find a part time job to help with expenses. Will check labs today, update echo - she really needs to maintain her health as she will continue in this role of caregiver.   Current medicines are reviewed with the patient today.  The patient does not have concerns regarding medicines other than what has been noted above.  The following changes have been made:  See above.  Labs/ tests ordered today include:    Orders Placed This Encounter  Procedures  . Basic metabolic panel  . CBC  . Hepatic function panel  . Lipid panel  . EKG 12-Lead  . ECHOCARDIOGRAM COMPLETE     Disposition:   FU with me in about 6 months.   Patient is agreeable to this plan and will call if any problems develop in the interim.   SignedNorma Fredrickson, NP  03/14/2017 10:50 AM  [facility] [address] [place], [place]  [address] Phone: [phone] Fax: [phone]

## 2017-03-15 LAB — CBC
Hematocrit: 41.8 % (ref 34.0–46.6)
Hemoglobin: 14.6 g/dL (ref 11.1–15.9)
MCH: 30 pg (ref 26.6–33.0)
MCHC: 34.9 g/dL (ref 31.5–35.7)
MCV: 86 fL (ref 79–97)
Platelets: 248 10*3/uL (ref 150–379)
RBC: 4.86 x10E6/uL (ref 3.77–5.28)
RDW: 13.7 % (ref 12.3–15.4)
WBC: 7 10*3/uL (ref 3.4–10.8)

## 2017-03-15 LAB — BASIC METABOLIC PANEL
BUN/Creatinine Ratio: 9 — ABNORMAL LOW (ref 12–28)
BUN: 7 mg/dL — ABNORMAL LOW (ref 8–27)
CO2: 23 mmol/L (ref 20–29)
Calcium: 9.7 mg/dL (ref 8.7–10.3)
Chloride: 103 mmol/L (ref 96–106)
Creatinine, Ser: 0.75 mg/dL (ref 0.57–1.00)
GFR calc Af Amer: 90 mL/min/{1.73_m2} (ref >59)
GFR calc non Af Amer: 78 mL/min/{1.73_m2} (ref >59)
Glucose: 102 mg/dL — ABNORMAL HIGH (ref 65–99)
Potassium: 3.6 mmol/L (ref 3.5–5.2)
Sodium: 140 mmol/L (ref 134–144)

## 2017-03-15 LAB — HEPATIC FUNCTION PANEL
ALT: 17 IU/L (ref 0–32)
AST: 22 IU/L (ref 0–40)
Albumin: 4.2 g/dL (ref 3.5–4.8)
Alkaline Phosphatase: 91 IU/L (ref 39–117)
Bilirubin Total: 0.4 mg/dL (ref 0.0–1.2)
Bilirubin, Direct: 0.13 mg/dL (ref 0.00–0.40)
Total Protein: 7.2 g/dL (ref 6.0–8.5)

## 2017-03-15 LAB — LIPID PANEL
Chol/HDL Ratio: 3.7 ratio (ref 0.0–4.4)
Cholesterol, Total: 183 mg/dL (ref 100–199)
HDL: 50 mg/dL (ref >39)
LDL Calculated: 117 mg/dL — ABNORMAL HIGH (ref 0–99)
Triglycerides: 80 mg/dL (ref 0–149)
VLDL Cholesterol Cal: 16 mg/dL (ref 5–40)

## 2017-03-23 ENCOUNTER — Ambulatory Visit (HOSPITAL_COMMUNITY): Payer: Medicare Other | Attending: Cardiovascular Disease

## 2017-03-23 ENCOUNTER — Other Ambulatory Visit: Payer: Self-pay

## 2017-03-23 DIAGNOSIS — Z79899 Other long term (current) drug therapy: Secondary | ICD-10-CM | POA: Diagnosis not present

## 2017-03-23 DIAGNOSIS — I10 Essential (primary) hypertension: Secondary | ICD-10-CM | POA: Insufficient documentation

## 2017-03-23 DIAGNOSIS — I051 Rheumatic mitral insufficiency: Secondary | ICD-10-CM | POA: Diagnosis not present

## 2017-03-23 DIAGNOSIS — I080 Rheumatic disorders of both mitral and aortic valves: Secondary | ICD-10-CM | POA: Insufficient documentation

## 2017-03-23 DIAGNOSIS — I481 Persistent atrial fibrillation: Secondary | ICD-10-CM

## 2017-03-23 DIAGNOSIS — I4819 Other persistent atrial fibrillation: Secondary | ICD-10-CM

## 2017-04-13 ENCOUNTER — Encounter: Payer: Self-pay | Admitting: Internal Medicine

## 2017-04-13 ENCOUNTER — Other Ambulatory Visit: Payer: Self-pay

## 2017-04-13 ENCOUNTER — Ambulatory Visit (INDEPENDENT_AMBULATORY_CARE_PROVIDER_SITE_OTHER): Payer: Medicare Other | Admitting: Internal Medicine

## 2017-04-13 VITALS — BP 164/83 | HR 62 | Temp 98.0°F | Wt 193.0 lb

## 2017-04-13 DIAGNOSIS — Z7983 Long term (current) use of bisphosphonates: Secondary | ICD-10-CM | POA: Diagnosis not present

## 2017-04-13 DIAGNOSIS — Z6833 Body mass index (BMI) 33.0-33.9, adult: Secondary | ICD-10-CM

## 2017-04-13 DIAGNOSIS — I4892 Unspecified atrial flutter: Secondary | ICD-10-CM

## 2017-04-13 DIAGNOSIS — M150 Primary generalized (osteo)arthritis: Secondary | ICD-10-CM

## 2017-04-13 DIAGNOSIS — R5383 Other fatigue: Secondary | ICD-10-CM

## 2017-04-13 DIAGNOSIS — R011 Cardiac murmur, unspecified: Secondary | ICD-10-CM | POA: Diagnosis not present

## 2017-04-13 DIAGNOSIS — Z7901 Long term (current) use of anticoagulants: Secondary | ICD-10-CM | POA: Diagnosis not present

## 2017-04-13 DIAGNOSIS — E669 Obesity, unspecified: Secondary | ICD-10-CM

## 2017-04-13 DIAGNOSIS — I4949 Other premature depolarization: Secondary | ICD-10-CM

## 2017-04-13 DIAGNOSIS — K5909 Other constipation: Secondary | ICD-10-CM | POA: Diagnosis not present

## 2017-04-13 DIAGNOSIS — M8949 Other hypertrophic osteoarthropathy, multiple sites: Secondary | ICD-10-CM

## 2017-04-13 DIAGNOSIS — M810 Age-related osteoporosis without current pathological fracture: Secondary | ICD-10-CM

## 2017-04-13 DIAGNOSIS — Z79899 Other long term (current) drug therapy: Secondary | ICD-10-CM

## 2017-04-13 DIAGNOSIS — Z79891 Long term (current) use of opiate analgesic: Secondary | ICD-10-CM

## 2017-04-13 DIAGNOSIS — I10 Essential (primary) hypertension: Secondary | ICD-10-CM

## 2017-04-13 DIAGNOSIS — M159 Polyosteoarthritis, unspecified: Secondary | ICD-10-CM

## 2017-04-13 MED ORDER — HYDROCHLOROTHIAZIDE 25 MG PO TABS: 25.0000 mg | ORAL_TABLET | Freq: Every day | ORAL | 3 refills | Status: DC

## 2017-04-13 MED ORDER — AMLODIPINE BESYLATE 10 MG PO TABS
10.0000 mg | ORAL_TABLET | Freq: Every day | ORAL | 3 refills | Status: DC
Start: 2017-04-13 — End: 2017-10-08

## 2017-04-13 NOTE — Assessment & Plan Note (Signed)
Assessment  She states that her arthritis is reasonably well-controlled on the as needed tramadol.  Plan  We will continue the as needed tramadol and reassess the efficacy of this therapy in controlling her osteoarthritic pain at the follow-up visit.

## 2017-04-13 NOTE — Patient Instructions (Signed)
It was good to see you again.  I like the idea of you going to a day care to see if you can pick up some hours.  I think it would help on several levels.  1) Stop the furosemide (Lasix) and potassium (KCl).  2) Start HCTZ (hydrochlorothiazide) 25 mg every morning for blood pressure and swelling.  3) Keep taking you other medications as you are.  I will see you in three months, sooner if necessary.

## 2017-04-13 NOTE — Progress Notes (Signed)
   Subjective:    Patient ID: [name] Spinella, female    DOB: 25-Jan-1940, 77 y.o.   MRN: [id number]  HPI  [name] Wich is here for follow-up of her essential hypertension, degenerative joint disease, osteoporosis, atrial flutter, and chronic constipation. Please see the A&P for the status of the pt's chronic medical problems.  Review of Systems  Constitutional: Positive for fatigue. Negative for activity change, appetite change and unexpected weight change.  Eyes: Negative for pain, discharge, redness and itching.  Respiratory: Negative for cough, chest tightness and shortness of breath.   Cardiovascular: Negative for chest pain, palpitations and leg swelling.  Gastrointestinal: Positive for constipation. Negative for abdominal distention, abdominal pain, diarrhea, nausea and vomiting.  Genitourinary: Negative for difficulty urinating.  Musculoskeletal: Positive for arthralgias. Negative for gait problem, joint swelling and myalgias.  Skin: Negative for rash.      Objective:   Physical Exam  Constitutional: She is oriented to person, place, and time. She appears well-developed and well-nourished. No distress.  HENT:  Head: Normocephalic and atraumatic.  Eyes: Conjunctivae and EOM are normal. Right eye exhibits no discharge. Left eye exhibits no discharge. No scleral icterus.  Cardiovascular: Exam reveals no gallop and no friction rub.  Murmur heard. Occasional extrasystole.  II/VI systolic murmur that is crescendo-decrescendo best heard along the left sternal boarder  Pulmonary/Chest: Effort normal and breath sounds normal. No respiratory distress. She has no wheezes. She has no rales.  Abdominal: Soft. Bowel sounds are normal. She exhibits no distension. There is no tenderness. There is no rebound and no guarding.  Musculoskeletal: Normal range of motion. She exhibits no edema, tenderness or deformity.  Neurological: She is alert and oriented to person, place, and time. She exhibits  normal muscle tone.  Skin: Skin is warm and dry. No rash noted. She is not diaphoretic. No erythema.  Psychiatric: She has a normal mood and affect. Her behavior is normal. Judgment and thought content normal.  Nursing note and vitals reviewed.     Assessment & Plan:   Please see problem oriented charting.

## 2017-04-13 NOTE — Assessment & Plan Note (Signed)
Assessment  On exam it is unclear if she was in atrial fibrillation/flutter. She appeared to have extrasystoles. That said, her heart rate was controlled on no nodal blocker at 62 bpm. She states she's been compliant with her anticoagulation, specifically Eliquis,but continues to have difficulty affording it and is asking for samples.  Plan  She was given a one-month supply and samples to help defray the costs of her anticoagulation.  At the next clinic visit we will reassess her compliance with anticoagulation as well as stable heart rate off of nodal agents given what is likely an underlying conduction system abnormality.

## 2017-04-13 NOTE — Assessment & Plan Note (Signed)
She is currently up-to-date on her healthcare maintenance. 

## 2017-04-13 NOTE — Assessment & Plan Note (Signed)
Assessment  She is down 4 pounds since the last clinic visit and is now at 193 pounds. This puts her in a BMI range of 33. She was praised on the weight loss and encouraged to remain active  Plan  We will reassess her ability to maintain or further lose weight with the improvement in the weather at the follow-up visit.

## 2017-04-13 NOTE — Assessment & Plan Note (Signed)
Assessment  Because the last DEXA scan demonstrated marked progression in her osteoporosis after a bisphosphonate holiday she was restarted on the alendronate weekly. Although she initially had some concerns about the cost she assures me she does take the alendronate as prescribed.  Plan  We will continue the alendronate at 70 mg weekly and reassess the efficacy of this therapy on maintaining bone health when she is due for a repeat DEXA scan in December 2020.

## 2017-04-13 NOTE — Assessment & Plan Note (Signed)
Assessment  She continues to struggle with constipation, but it is unchanged from her baseline which is long-standing. She did restart the Dulcolax after the last clinic visit with some improvement. She is not sure the MiraLAX is effective.  Plan  We will continue with the as needed Dulcolax and reassess her constipation at follow-up visit.

## 2017-04-13 NOTE — Assessment & Plan Note (Signed)
Assessment  Her blood pressure today is not at target. It is 164/83.  She is currently on amlodipine 10 mg by mouth daily. Although there was some confusion as to whether or not she is consistently taking this she believes that she is. She just failed to bring the bottle in with her other medications.  Plan  I re-sent the prescription for amlodipine 10 mg by mouth daily to her pharmacy to make sure she has it available. I also started hydrochlorothiazide 25 mg by mouth every morning in hopes of improving her blood pressure control as well as managing any lower extremity edema. With the addition of hydrochlorothiazide I stopped the as needed Lasix and associated potassium chloride. She will require a repeat basic metabolic panel at the follow-up visit, at which point we will also reassess her blood pressure on the new antihypertensive regimen.

## 2017-05-24 ENCOUNTER — Telehealth: Payer: Self-pay | Admitting: *Deleted

## 2017-05-24 ENCOUNTER — Telehealth: Payer: Self-pay | Admitting: Internal Medicine

## 2017-05-24 NOTE — Telephone Encounter (Signed)
Patient calling about her medicine, pls patient call

## 2017-05-24 NOTE — Telephone Encounter (Signed)
Call from pt - states she's out of Eliquis and she had received samples , requesting more.I will send pt's request to Amada Kingfisher.

## 2017-05-24 NOTE — Telephone Encounter (Signed)
Called pt - no answer; left message may pick up sample anytime per Dr Amada Kingfisher.

## 2017-05-24 NOTE — Telephone Encounter (Signed)
Incoming call from pt-requesting eliquis samples.  Dicussed with attending MD-pt will be provided samples (will pick up today)  

## 2017-05-24 NOTE — Telephone Encounter (Signed)
Incoming call from pt-requesting eliquis samples.  Dicussed with attending MD-pt will be provided samples (will pick up today)

## 2017-06-07 ENCOUNTER — Encounter: Payer: Self-pay | Admitting: Internal Medicine

## 2017-06-07 ENCOUNTER — Ambulatory Visit (INDEPENDENT_AMBULATORY_CARE_PROVIDER_SITE_OTHER): Payer: Medicare Other | Admitting: Internal Medicine

## 2017-06-07 ENCOUNTER — Other Ambulatory Visit: Payer: Self-pay

## 2017-06-07 DIAGNOSIS — R059 Cough, unspecified: Secondary | ICD-10-CM

## 2017-06-07 DIAGNOSIS — R053 Chronic cough: Secondary | ICD-10-CM | POA: Insufficient documentation

## 2017-06-07 DIAGNOSIS — R05 Cough: Secondary | ICD-10-CM

## 2017-06-07 MED ORDER — FLUTICASONE PROPIONATE 50 MCG/ACT NA SUSP: 1.0000 | Freq: Every day | NASAL | 2 refills | Status: DC

## 2017-06-07 NOTE — Progress Notes (Signed)
   CC: Cough  HPI:  [name] is a 77 y.o. female with a past medical history listed below here today with complaints of cough.  For details of today's visit and the status of her chronic medical issues please refer to the assessment and plan.   Past Medical History:  Diagnosis Date  . Anemia 03/16/2014  . Atrial flutter (HCC) 05/07/2014   With controlled ventricular rate on no AV nodal agents.  . Chronic rheumatic heart disease 05/05/2014  . Constipation    takes Dulcolax as needed  . 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 Essential hypertension    takes Amlodipine daily  . GERD (gastroesophageal reflux disease)    takes Pantoprazole daily  . History of blood transfusion    no abnormal reaction noted  . History of migraine    last one a week  . History of shingles   . Hypertensive retinopathy of both eyes 03/07/2016   Moderate  . Mitral valve regurgitation, rheumatic 05/05/2014   Moderate to severe with peak PA pressure of 56 mmHg on Echo 04/2014   . Muscle spasm of both lower legs    takes Zanaflex daily as needed  . Nocturia   . Osteoarthritis    knees and hands  . Peripheral edema    takes Lasix daily as needed   . PONV (postoperative nausea and vomiting)    with recent dilatation of throat, had N/V  . Systolic murmur    per patient report she has had rheumatic fever in childhood   Review of Systems:   No chest pain or shortness of breath  Physical Exam:  Vitals:   06/07/17 0916  BP: 137/67  Pulse: (!) 50  Temp: 97.9 F (36.6 C)  TempSrc: Oral  SpO2: 100%  Weight: 197 lb 14.4 oz (89.8 kg)  Height: 5\' 4"  (1.626 m)   Physical Exam  Constitutional: She is well-developed, well-nourished, and in no distress.  HENT:  Head: Normocephalic and atraumatic.  Nose: Nose normal.  Mouth/Throat: Oropharynx is clear and moist. No oropharyngeal exudate.  Eyes: Right eye  exhibits no discharge. Left eye exhibits no discharge. No scleral icterus.  Cardiovascular: Normal rate and regular rhythm.  Pulmonary/Chest: Effort normal and breath sounds normal. No respiratory distress. She has no wheezes.  Skin: Skin is warm and dry.  Psychiatric: Mood and affect normal.  Vitals reviewed.     Assessment & Plan:   See Encounters Tab for problem based charting.  Patient discussed with Dr. Oswaldo DoneVincent

## 2017-06-07 NOTE — Assessment & Plan Note (Signed)
Ms.  Abigail Flowersresents today with complaints of cough.  She reports for the past 2 weeks she has had a nonproductive cough.  Her cough occurs throughout the day but is worse at night.  Upon further questioning she also notes that she has had itchy/watery eyes, rhinorrhea, dry scratchy throat.  She denies any previous history of allergies.  She reports taking a cough medicine at home over-the-counter without any improvement.  She denies any fevers, chills, this of breath.  A/P Suspect she may have some seasonal allergies contributing to her symptoms.  We will do trial of nasal irrigation and fluticasone spray.

## 2017-06-07 NOTE — Patient Instructions (Addendum)
Ms. Annia FriendlyBeard,  I am sorry you are having issues with coughing and not feeling well. I worry that you have some seasonal allergies that are causing some of your problems. I am going to send in a nasal spray for you called Flonase. I would also recommend nasal irrigation. I have given some information below.   BUFFERED ISOTONIC SALINE NASAL IRRIGATION  The Benefits:  1. When you irrigate, the isotonic saline (salt water) acts as a solvent and washes the mucus crusts and other debris from your nose.  2. This decongests and improves the airflow into your nose. The sinus passages begin to open.  3. Studies have also shown that a salt water and an alkaline (baking soda) irrigation solution improves nasal membrane cell function (mucociliary flow of mucus debris).  The Recipe:  1. Choose a 1-quart glass jar that is thoroughly cleansed.  2. Fill with sterile or distilled water, or you can boil water from the tap.  3. Add 1 to 2 heaping teaspoons of "pickling/canning/sea" salt (NOT table salt as it contains a large number of additives). This salt is available at the grocery store in the food canning section.  4. Add 1 teaspoon of [employer] Hammer Baking Soda (pure bicarbonate).  5. Mix ingredients together and store at room temperature. Discard after one week. If you find this solution too strong, you may decrease the amount of salt added to 1 to 1  teaspoons. With children it is often best to start with a milder solution and advance slowly. Irrigate with 240 ml (8 oz) twice daily.  The Instructions:  You should plan to irrigate your nose with buffered isotonic saline 2 times per day. Many people prefer to warm the solution slightly in the microwave - but be sure that the solution is NOT HOT. Stand over the sink (some do this in the shower) and squirt the solution into each side of your nose, keeping your mouth open. This allows you to spit the saltwater out of your mouth. It will not harm you if you  swallow a little.  If you have been told to use a nasal steroid such as Flonase, Nasonex, or Nasacort, you should always use isortonic saline solution first, then use your nasal steroid product. The nasal steroid is much more effective when sprayed onto clean nasal membranes and the steroid medicine will reach deeper into the nose.  Most people experience a little burning sensation the first few times they use a isotonic saline solution, but this usually goes away within a few days.   NASAL STEROID USE INSTRUCTIONS  Step 1. Prepare the nose. Blow the nose before administering the drug.  Step 2. Prime and activate the delivery device as recommended by the manufacturer.  Step 3. Position the head by tilting the head forward.  Step 4. Insert the tip of the applicator gently, avoiding contact with the septum.  Step 5. Aim the applicator tip about 45 from the floor of the nose and direct it at the outer corner of the eye on the same side to avoid traumatizing or spraying the septum.  Step 6. Close the other nostril gently with a finger.   Step 7. Sniff or inhale gently while delivering the drug.

## 2017-06-08 NOTE — Progress Notes (Signed)
Internal Medicine Clinic Attending  Case discussed with Dr. Boswell at the time of the visit.  We reviewed the resident's history and exam and pertinent patient test results.  I agree with the assessment, diagnosis, and plan of care documented in the resident's note.  

## 2017-07-10 ENCOUNTER — Other Ambulatory Visit: Payer: Self-pay | Admitting: Internal Medicine

## 2017-07-10 DIAGNOSIS — I10 Essential (primary) hypertension: Secondary | ICD-10-CM

## 2017-07-12 DIAGNOSIS — H25011 Cortical age-related cataract, right eye: Secondary | ICD-10-CM | POA: Diagnosis not present

## 2017-07-12 DIAGNOSIS — H40013 Open angle with borderline findings, low risk, bilateral: Secondary | ICD-10-CM | POA: Diagnosis not present

## 2017-07-12 DIAGNOSIS — H35362 Drusen (degenerative) of macula, left eye: Secondary | ICD-10-CM | POA: Diagnosis not present

## 2017-07-12 DIAGNOSIS — H35033 Hypertensive retinopathy, bilateral: Secondary | ICD-10-CM | POA: Diagnosis not present

## 2017-07-13 ENCOUNTER — Telehealth: Payer: Self-pay | Admitting: Nurse Practitioner

## 2017-07-13 ENCOUNTER — Other Ambulatory Visit: Payer: Self-pay | Admitting: Internal Medicine

## 2017-07-13 ENCOUNTER — Telehealth: Payer: Self-pay

## 2017-07-13 DIAGNOSIS — I483 Typical atrial flutter: Secondary | ICD-10-CM

## 2017-07-13 NOTE — Telephone Encounter (Signed)
Spoke with patient as it looks like patients pcp has been refilling this and providing her with samples. In previous phone note, it is mentioned that she switch to warfarin but it was noted that they would afford the eliquis copays. She has also been informed that we cannot maintain her on samples and I reiterated this and also mentioned patient assistance which she did not seem interested. I have discussed this with Larita FifeLynn Via, LPN and she will also contact the patient. Patient will also contact her pcp to request samples.

## 2017-07-13 NOTE — Telephone Encounter (Signed)
New Message:       Pt is requesting for samples of eliquis 5 mg.

## 2017-07-13 NOTE — Telephone Encounter (Signed)
The pt called the office requesting samples of Eliquis.  See the note in the pts chart from 01/31/16 as follows: Awilda MetroSupple, Megan E, [facility]      01/31/16 4:34 PM  Note    Spoke with patient's daughter and discussed pros and cons of Eliquis vs warfarin. She states that she would like her mother to remain on Eliquis and that they will be ok affording copays. Encouraged pt to call clinic with any other questions.      The pt states that she has been getting samples from her PCP and has not paid for any refills.  I have advised her that in order for her to receive medication samples from this office she will need to fill out a [employer] pt asst application, obtain needed documentation and return all to me so I can fax to [employer].  She is also advised that I will leave her 2 bottles of Eliquis samples along with the [employer] pt asst application in the front office. She states that she will pick up today if she can.

## 2017-07-17 NOTE — Telephone Encounter (Signed)
New message    Patient has questions about the paperwork for the eliquis. She dropped packet off at the front desk

## 2017-07-17 NOTE — Telephone Encounter (Signed)
**Note De-Identified [name] Obfuscation** The pt returned her [employer] pt asst application to the office. She did not fill the application out completely.  I returned the pts call and made her aware that there are parts of the [employer] pt asst application that she did not complete. She offered to come back to the office so I can help her finish her application and give her 3 boxes of Eliquis samples so she will not run out. She is advised to ask for me when she gets to the office.

## 2017-08-06 NOTE — Telephone Encounter (Signed)
The pt returned her completed [employer] Pt Asst application back to the office. Abigail FredricksonLori Gerhardt, NP has signed the application and I have faxed all to [employer].  Also, the pt was given 2 boxes of Eliquis 5 mg samples while here as she will run out soon.

## 2017-08-10 NOTE — Telephone Encounter (Signed)
**Note De-Identified Hurley Sobel Obfuscation** Letter received from [employer] stating that they have approved the pt for pt asst with Eliquis. Approval good from 08/08/17 until 01/01/18.  Application Case# [id number]

## 2017-08-27 ENCOUNTER — Other Ambulatory Visit: Payer: Self-pay | Admitting: Internal Medicine

## 2017-08-27 DIAGNOSIS — N904 Leukoplakia of vulva: Secondary | ICD-10-CM

## 2017-09-03 ENCOUNTER — Other Ambulatory Visit: Payer: Self-pay | Admitting: Internal Medicine

## 2017-09-03 DIAGNOSIS — I099 Rheumatic heart disease, unspecified: Secondary | ICD-10-CM

## 2017-09-04 NOTE — Telephone Encounter (Signed)
?   Discontinued on 4/12.

## 2017-09-07 ENCOUNTER — Other Ambulatory Visit: Payer: Self-pay | Admitting: Internal Medicine

## 2017-09-07 DIAGNOSIS — K219 Gastro-esophageal reflux disease without esophagitis: Secondary | ICD-10-CM

## 2017-09-17 ENCOUNTER — Ambulatory Visit (INDEPENDENT_AMBULATORY_CARE_PROVIDER_SITE_OTHER): Payer: Medicare Other | Admitting: Nurse Practitioner

## 2017-09-17 ENCOUNTER — Encounter: Payer: Self-pay | Admitting: Nurse Practitioner

## 2017-09-17 ENCOUNTER — Telehealth: Payer: Self-pay | Admitting: Licensed Clinical Social Worker

## 2017-09-17 VITALS — BP 122/80 | HR 59 | Ht 64.0 in | Wt 202.0 lb

## 2017-09-17 DIAGNOSIS — Z7901 Long term (current) use of anticoagulants: Secondary | ICD-10-CM

## 2017-09-17 DIAGNOSIS — I10 Essential (primary) hypertension: Secondary | ICD-10-CM

## 2017-09-17 DIAGNOSIS — I051 Rheumatic mitral insufficiency: Secondary | ICD-10-CM | POA: Diagnosis not present

## 2017-09-17 DIAGNOSIS — I481 Persistent atrial fibrillation: Secondary | ICD-10-CM | POA: Diagnosis not present

## 2017-09-17 DIAGNOSIS — Z79899 Other long term (current) drug therapy: Secondary | ICD-10-CM | POA: Diagnosis not present

## 2017-09-17 DIAGNOSIS — I4819 Other persistent atrial fibrillation: Secondary | ICD-10-CM

## 2017-09-17 LAB — CBC
Hematocrit: 41.1 % (ref 34.0–46.6)
Hemoglobin: 14.4 g/dL (ref 11.1–15.9)
MCH: 30.1 pg (ref 26.6–33.0)
MCHC: 35 g/dL (ref 31.5–35.7)
MCV: 86 fL (ref 79–97)
Platelets: 230 10*3/uL (ref 150–450)
RBC: 4.78 x10E6/uL (ref 3.77–5.28)
RDW: 13.8 % (ref 12.3–15.4)
WBC: 6.6 10*3/uL (ref 3.4–10.8)

## 2017-09-17 LAB — LIPID PANEL
Chol/HDL Ratio: 3.5 ratio (ref 0.0–4.4)
Cholesterol, Total: 183 mg/dL (ref 100–199)
HDL: 53 mg/dL (ref >39)
LDL Calculated: 113 mg/dL — ABNORMAL HIGH (ref 0–99)
Triglycerides: 83 mg/dL (ref 0–149)
VLDL Cholesterol Cal: 17 mg/dL (ref 5–40)

## 2017-09-17 LAB — BASIC METABOLIC PANEL
BUN/Creatinine Ratio: 17 (ref 12–28)
BUN: 13 mg/dL (ref 8–27)
CO2: 22 mmol/L (ref 20–29)
Calcium: 9.7 mg/dL (ref 8.7–10.3)
Chloride: 103 mmol/L (ref 96–106)
Creatinine, Ser: 0.75 mg/dL (ref 0.57–1.00)
GFR calc Af Amer: 89 mL/min/{1.73_m2} (ref >59)
GFR calc non Af Amer: 77 mL/min/{1.73_m2} (ref >59)
Glucose: 92 mg/dL (ref 65–99)
Potassium: 3.1 mmol/L — ABNORMAL LOW (ref 3.5–5.2)
Sodium: 141 mmol/L (ref 134–144)

## 2017-09-17 LAB — HEPATIC FUNCTION PANEL
ALT: 17 IU/L (ref 0–32)
AST: 24 IU/L (ref 0–40)
Albumin: 4.4 g/dL (ref 3.5–4.8)
Alkaline Phosphatase: 57 IU/L (ref 39–117)
Bilirubin Total: 0.5 mg/dL (ref 0.0–1.2)
Bilirubin, Direct: 0.14 mg/dL (ref 0.00–0.40)
Total Protein: 7.2 g/dL (ref 6.0–8.5)

## 2017-09-17 LAB — TSH: TSH: 1.66 u[IU]/mL (ref 0.450–4.500)

## 2017-09-17 NOTE — Telephone Encounter (Signed)
CSW received referral from Norma FredricksonLori Gerhardt, NP to assist patient with resources as she is struggling to make ends meet. CSW contacted patient and left message for return call. Lasandra BeechJackie Kenji Mapel, LCSW, CCSW-MCS [phone]

## 2017-09-17 NOTE — Patient Instructions (Addendum)
We will be checking the following labs today - BMET, CBC, HPF, Lipids and TSH   Medication Instructions:    Continue with your current medicines.     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   I am going to speak to the social worker at the hospital and see if we can get you some help.     If you need a refill on your cardiac medications before your next appointment, please call your pharmacy.   Call the [facility] office at [phone] if you have any questions, problems or concerns.

## 2017-09-17 NOTE — Progress Notes (Signed)
CARDIOLOGY OFFICE NOTE  Date:  09/17/2017    [name] Date of Birth: Feb 27, 1940 Medical Record #[id number]  PCP:  Abigail Poisson, MD  Cardiologist:  [name]   Chief Complaint  Patient presents with  . Atrial Fibrillation    6 month check.     History of Present Illness: [name] is a 77 y.o. female who presents today for a 6 month check. Former patient of Dr. Yevonne [name]. She follows with me.   She has a history of chronic atrial fib, HTN, OA, rheumatic fever with MV regurgitation and anemia. She is on Eliquis for her anticoagulation. No beta blocker due to bradycardia.   She was found to be in atrial flutter when she presented for a surgical procedure at [facility] back on 04/13/2014.She had had a previous EKG on 03/16/14 which showed that she was in normal sinus rhythm at that time. She was cleared for the surgery and went on to have her surgical procedure without incident. She was found to have cricopharyngeal muscle hypertrophy - was treated with Botox.  I have seen her back several times - she has had both knees done in the interim. She has done well. She hadstopped working.I last saw her back in March and she was doing well.   Comes back today. Herealone. She feels pretty good. Lots of issues with money. Sounds like she is going to lose her supplement coverage - she keeps talking about being "$800 in the hole". Still with a mortgage. Kids really can't help. She is getting assistance with her Eliquis. She wants some type of part time job and has for some time. She takes care of her husband as well but thinks he would actually do better if she was not "available" to him so much. No chest pain. Breathing is ok. She has gained weight. No swelling. No bleeding/bruising. Other than her social issues, she feels like she is doing ok.   Past Medical History:  Diagnosis Date  . Anemia 03/16/2014  . Atrial flutter (HCC) 05/07/2014   With controlled  ventricular rate on no AV nodal agents.  . Chronic rheumatic heart disease 05/05/2014  . Constipation    takes Dulcolax as needed  . 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 Essential hypertension    takes Amlodipine daily  . GERD (gastroesophageal reflux disease)    takes Pantoprazole daily  . History of blood transfusion    no abnormal reaction noted  . History of migraine    last one a week  . History of shingles   . Hypertensive retinopathy of both eyes 03/07/2016   Moderate  . Mitral valve regurgitation, rheumatic 05/05/2014   Moderate to severe with peak PA pressure of 56 mmHg on Echo 04/2014   . Muscle spasm of both lower legs    takes Zanaflex daily as needed  . Nocturia   . Osteoarthritis    knees and hands  . Peripheral edema    takes Lasix daily as needed   . PONV (postoperative nausea and vomiting)    with recent dilatation of throat, had N/V  . Systolic murmur    per patient report she has had rheumatic fever in childhood    Past Surgical History:  Procedure Laterality Date  . ABDOMINAL HYSTERECTOMY     Fibroids  . COLONOSCOPY W/ BIOPSIES AND POLYPECTOMY    . CYST EXCISION Right  on hand  . ESOPHAGOSCOPY WITH DILITATION N/A 04/14/2014   Procedure: RIGID ESOPHAGOSCOPY WITH DILITATION AND BOTOX  INJECTION;  Surgeon: Abigail Coho, MD;  Location: [facility] OR;  Service: ENT;  Laterality: N/A;  . MULTIPLE TOOTH EXTRACTIONS    . TOTAL KNEE ARTHROPLASTY Right 04/06/2015  . TOTAL KNEE ARTHROPLASTY Right 04/06/2015   Procedure: RIGHT TOTAL KNEE ARTHROPLASTY;  Surgeon: Abigail Corning, MD;  Location: [facility] OR;  Service: Orthopedics;  Laterality: Right;  . TOTAL KNEE ARTHROPLASTY Left 09/14/2015   Procedure: TOTAL KNEE ARTHROPLASTY LEFT;  Surgeon: Abigail Corning, MD;  Location: [facility] OR;  Service: Orthopedics;  Laterality: Left;     Medications: Current Meds  Medication Sig  . alendronate  (FOSAMAX) 70 MG tablet Take 1 tablet (70 mg total) by mouth once a week. Take with a full glass of water on an empty stomach.  [name] amLODipine (NORVASC) 10 MG tablet Take 1 tablet (10 mg total) by mouth daily.  [name] apixaban (ELIQUIS) 5 MG TABS tablet Take 1 tablet (5 mg total) by mouth 2 (two) times daily.  [name] augmented betamethasone dipropionate (DIPROLENE-AF) 0.05 % ointment Apply topically 2 (two) times daily.  . bisacodyl (DULCOLAX) 5 MG EC tablet Take 2 tablets (10 mg total) by mouth daily as needed for moderate constipation.  . fluticasone (FLONASE) 50 MCG/ACT nasal spray Place 1 spray into both nostrils daily.  . hydrochlorothiazide (HYDRODIURIL) 25 MG tablet Take 1 tablet (25 mg total) by mouth daily.  . pantoprazole (PROTONIX) 40 MG tablet Take 1 tablet (40 mg total) by mouth daily.  [name] tiZANidine (ZANAFLEX) 2 MG tablet Take 1 tablet (2 mg total) by mouth every 8 (eight) hours as needed for muscle spasms.  . traMADol (ULTRAM) 50 MG tablet Take 50-100 mg by mouth every 8 (eight) hours.      Allergies: Allergies  Allergen Reactions  . Ace Inhibitors Cough    Social History: The patient  reports that she has never smoked. She has never used smokeless tobacco. She reports that she does not drink alcohol or use drugs.   Family History: The patient's family history includes Asthma in her sister; Cancer in her brother, brother, and mother; Healthy in her daughter, daughter, daughter, and son; Other in her brother, brother, brother, brother, brother, and sister; Stroke in her father; Unexplained death in her sister.   Review of Systems: Please see the history of present illness.   Otherwise, the review of systems is positive for none.   All other systems are reviewed and negative.   Physical Exam: VS:  BP 122/80 (BP Location: Left Arm, Patient Position: Sitting, Cuff Size: Large)   Pulse (!) 59   Ht 5\' 4"  (1.626 m)   Wt 202 lb (91.6 kg)   SpO2 98% Comment: at rest  BMI 34.67 kg/m  .  BMI  Body mass index is 34.67 kg/m.  Wt Readings from Last 3 Encounters:  09/17/17 202 lb (91.6 kg)  06/07/17 197 lb 14.4 oz (89.8 kg)  04/13/17 193 lb (87.5 kg)    General: Pleasant. Obese. Alert and in no acute distress. She has gained weight.  HEENT: Normal.  Neck: Supple, no JVD, carotid bruits, or masses noted.  Cardiac: Irregular irregular but with good rate control.  Outflow murmur noted. No edema.  Respiratory:  Lungs are clear to auscultation bilaterally with normal work of breathing.  GI: Soft and nontender.  MS: No deformity or atrophy. Gait and ROM intact.  Skin: Warm and dry. Color is normal.  Neuro:  Strength and sensation are intact and no gross focal deficits noted.  Psych: Alert, appropriate and with normal affect.   LABORATORY DATA:  EKG:  EKG is not ordered today.  Lab Results  Component Value Date   WBC 7.0 03/14/2017   HGB 14.6 03/14/2017   HCT 41.8 03/14/2017   PLT 248 03/14/2017   GLUCOSE 102 (H) 03/14/2017   CHOL 183 03/14/2017   TRIG 80 03/14/2017   HDL 50 03/14/2017   LDLCALC 117 (H) 03/14/2017   ALT 17 03/14/2017   AST 22 03/14/2017   NA 140 03/14/2017   K 3.6 03/14/2017   CL 103 03/14/2017   CREATININE 0.75 03/14/2017   BUN 7 (L) 03/14/2017   CO2 23 03/14/2017   TSH 2.97 02/16/2015   INR 1.25 09/03/2015   HGBA1C 5.6 03/16/2014     BNP (last 3 results) No results for input(s): BNP in the last 8760 hours.  ProBNP (last 3 results) No results for input(s): PROBNP in the last 8760 hours.   Other Studies Reviewed Today:  Echo Study Conclusions from 04/2015  - Left ventricle: The cavity size was normal. There was mild focal  basal hypertrophy of the septum. Systolic function was normal.  The estimated ejection fraction was in the range of 55% to 60%.  Wall motion was normal; there were no regional wall motion  abnormalities. - Aortic valve: There was mild stenosis. - Mitral valve: There was mild regurgitation. - Left atrium: The  atrium was moderately to severely dilated. - Pulmonary arteries: Systolic pressure was mildly increased. PA  peak pressure: 32 mm Hg (S).  Impressions:  - Mitral regurgitation was previously described as moderate to  severe. Images could not be directly compared.    Assessment/Plan:  1. PAF/flutter - this is persistent - she is managed with rate control and continued anticoagulation - no problems noted at this time. Needs lab today.   2. Chronic anticoagulation - she is getting patient assistance. No bleeding/excessive bruising.   3. HTN - BP is fine - no changes made.   4. Rheumatic heart disease with MR - echo from May noted - will need to follow.   5. 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 She is still wanting to find a part time job to help with expenses and to make ends meet. I have left a message with the social worker at [facility] to see if there are any options.   Current medicines are reviewed with the patient today.  The patient does not have concerns regarding medicines other than what has been noted above.  The following changes have been made:  See above.  Labs/ tests ordered today include:    Orders Placed This Encounter  Procedures  . Basic metabolic panel  . CBC  . Hepatic function panel  . Lipid panel  . TSH     Disposition:   FU with me in 6 months.   Patient is agreeable to this plan and will call if any problems develop in the interim.   SignedNorma Fredrickson: Tarra Pence, NP  09/17/2017 11:28 AM  [facility] [address] [place]  [address] Phone: [phone] Fax: [phone]

## 2017-09-18 ENCOUNTER — Telehealth: Payer: Self-pay | Admitting: Licensed Clinical Social Worker

## 2017-09-18 ENCOUNTER — Other Ambulatory Visit: Payer: Self-pay | Admitting: *Deleted

## 2017-09-18 DIAGNOSIS — E876 Hypokalemia: Secondary | ICD-10-CM

## 2017-09-18 MED ORDER — POTASSIUM CHLORIDE CRYS ER 20 MEQ PO TBCR: 20.0000 meq | EXTENDED_RELEASE_TABLET | Freq: Every day | ORAL | 9 refills | Status: DC

## 2017-09-19 NOTE — Telephone Encounter (Signed)
CSW received return call from patient. She expressed concerns with inability to financially afford her insurance supplement. Patient states she is on a fixed income and limited options. CSW discussed contacting [employer] to discuss qualifications for the extra help program which may assist with payment for Medicare premiums which would increase her monthly income. Patient verbalizes understanding and will follow up with [employer]. CSW encouraged patient to return call to CSW with outcome and further assistance if needed. Lasandra BeechJackie Caterine Mcmeans, LCSW, CCSW-MCS [phone]

## 2017-09-25 ENCOUNTER — Telehealth: Payer: Self-pay | Admitting: Licensed Clinical Social Worker

## 2017-09-25 NOTE — Telephone Encounter (Signed)
CSW spoke with patient to follow up on previous referral. Patient states she "made an application" with [employer] and hopeful to qualify for extra help program. Patient went to share about a [place] that she [place] to [employer]. Patient unclear exactly what this bill is and thinks it is for an additional health insurance policy. CSW unable to determine how to help without seeing the bill and helping patient navigate therefore will meet with patient at [facility] [address] Appointment on 10/17/17. Patient grateful for the assistance.Lasandra BeechJackie Lori Popowski, LCSW, CCSW-MCS [phone]

## 2017-10-08 ENCOUNTER — Other Ambulatory Visit: Payer: Self-pay | Admitting: Internal Medicine

## 2017-10-08 DIAGNOSIS — I10 Essential (primary) hypertension: Secondary | ICD-10-CM

## 2017-10-08 MED ORDER — AMLODIPINE BESYLATE 10 MG PO TABS: 10.0000 mg | ORAL_TABLET | Freq: Every day | ORAL | 3 refills | Status: DC

## 2017-10-08 MED ORDER — HYDROCHLOROTHIAZIDE 25 MG PO TABS: 25.0000 mg | ORAL_TABLET | Freq: Every day | ORAL | 3 refills | Status: DC

## 2017-10-08 NOTE — Telephone Encounter (Signed)
Needs refill on blood pressure(new prescription) @ [facility] on [address]  ;pt contact [phone]

## 2017-10-17 ENCOUNTER — Encounter (HOSPITAL_COMMUNITY): Payer: Self-pay | Admitting: Licensed Clinical Social Worker

## 2017-10-17 ENCOUNTER — Other Ambulatory Visit: Payer: Medicare Other | Admitting: *Deleted

## 2017-10-17 DIAGNOSIS — E876 Hypokalemia: Secondary | ICD-10-CM

## 2017-10-17 LAB — BASIC METABOLIC PANEL
BUN/Creatinine Ratio: 15 (ref 12–28)
BUN: 12 mg/dL (ref 8–27)
CO2: 24 mmol/L (ref 20–29)
Calcium: 9.9 mg/dL (ref 8.7–10.3)
Chloride: 100 mmol/L (ref 96–106)
Creatinine, Ser: 0.82 mg/dL (ref 0.57–1.00)
GFR calc Af Amer: 80 mL/min/{1.73_m2} (ref >59)
GFR calc non Af Amer: 69 mL/min/{1.73_m2} (ref >59)
Glucose: 128 mg/dL — ABNORMAL HIGH (ref 65–99)
Potassium: 3.5 mmol/L (ref 3.5–5.2)
Sodium: 142 mmol/L (ref 134–144)

## 2017-10-17 NOTE — Progress Notes (Signed)
CSW met with patient in the office today. Patient brought her paperwork from [facility] which showed an outstanding bill for $758.50.  CSW and patient contacted customer service and were informed that patient had not paid the premium for 2 years. Patient states she was unaware of the growing bill. CSW previously referred patient to [facility] for application for the extra help program which she was recently approved which will cover the cost of her Medicare premiums going forward. Customer service informed patient that she will be responsible for the past due balance or she will be terminated from [facility] plan.  Patient also identified multiple financial stressors and states she only receives $1,015 monthly from [employer]. She reports she has limited food due to lack of finances and trying to make ends meet.Patient states "I am trying to find a job" to help with my finances.  CSW assisted patient through the Patient Assistance fund with outstanding bill and provided her with food resources/pantries and senior nutrition sites in the [place] area. CSW made arrangements for delivery of a food basket through a local pantry for tomorrow to help with her immediate  food insecurity.  Patient appears grateful and relieved of the stress of food insecurity and financial burdens. CSW provided number for future need and will be available if needed. Raquel Sarna, [place], [place]

## 2017-10-17 NOTE — Progress Notes (Signed)
CSW met with patient in the office today. Patient brought her paperwork from [facility] which showed an outstanding bill for $758.50.  CSW and patient contacted customer service and were informed that patient had not paid the premium for 2 years. Patient states she was unaware of the growing bill. CSW previously referred patient to [facility] for application for the extra help program which she was recently approved which will cover the cost of her Medicare premiums going forward. Customer service informed patient that she will be responsible for the past due balance or she will be terminated from [facility] plan.  Patient also identified multiple financial stressors and states she only receives $1,015 monthly from [employer]. She reports she has limited food due to lack of finances and trying to make ends meet.Patient states "I am trying to find a job" to help with my finances.  CSW assisted patient through the Patient Assistance fund with outstanding bill and provided her with food resources/pantries and senior nutrition sites in the [place] area. CSW made arrangements for delivery of a food basket through a local pantry for tomorrow to help with her immediate  food insecurity.  Patient appears grateful and relieved of the stress of food insecurity and financial burdens. CSW provided number for future need and will be available if needed. Raquel Sarna, [place], [place]

## 2017-10-23 ENCOUNTER — Telehealth (HOSPITAL_COMMUNITY): Payer: Self-pay | Admitting: Licensed Clinical Social Worker

## 2017-10-23 NOTE — Telephone Encounter (Signed)
CSW followed up with patient regarding [facility]. CSW informed patient of the Medicare open Enrollment and suggested she call to review her Medicare benefits for the next year. Patient verbalizes understanding and will follow up. CSW available as needed. Lasandra Beech, LCSW, CCSW-MCS [phone] '

## 2017-10-26 ENCOUNTER — Encounter: Payer: Self-pay | Admitting: Internal Medicine

## 2017-10-26 ENCOUNTER — Ambulatory Visit (INDEPENDENT_AMBULATORY_CARE_PROVIDER_SITE_OTHER): Payer: Medicare Other | Admitting: Internal Medicine

## 2017-10-26 ENCOUNTER — Other Ambulatory Visit: Payer: Self-pay

## 2017-10-26 VITALS — BP 143/86 | HR 58 | Temp 98.0°F | Ht 64.0 in | Wt 201.7 lb

## 2017-10-26 DIAGNOSIS — M159 Polyosteoarthritis, unspecified: Secondary | ICD-10-CM

## 2017-10-26 DIAGNOSIS — K5900 Constipation, unspecified: Secondary | ICD-10-CM

## 2017-10-26 DIAGNOSIS — R011 Cardiac murmur, unspecified: Secondary | ICD-10-CM

## 2017-10-26 DIAGNOSIS — M150 Primary generalized (osteo)arthritis: Secondary | ICD-10-CM

## 2017-10-26 DIAGNOSIS — M810 Age-related osteoporosis without current pathological fracture: Secondary | ICD-10-CM

## 2017-10-26 DIAGNOSIS — Z79899 Other long term (current) drug therapy: Secondary | ICD-10-CM

## 2017-10-26 DIAGNOSIS — I10 Essential (primary) hypertension: Secondary | ICD-10-CM | POA: Diagnosis not present

## 2017-10-26 DIAGNOSIS — N904 Leukoplakia of vulva: Secondary | ICD-10-CM

## 2017-10-26 DIAGNOSIS — Z23 Encounter for immunization: Secondary | ICD-10-CM

## 2017-10-26 DIAGNOSIS — K219 Gastro-esophageal reflux disease without esophagitis: Secondary | ICD-10-CM

## 2017-10-26 DIAGNOSIS — Z7983 Long term (current) use of bisphosphonates: Secondary | ICD-10-CM

## 2017-10-26 DIAGNOSIS — M8949 Other hypertrophic osteoarthropathy, multiple sites: Secondary | ICD-10-CM

## 2017-10-26 MED ORDER — BETAMETHASONE DIPROPIONATE AUG 0.05 % EX OINT: TOPICAL_OINTMENT | Freq: Every day | CUTANEOUS | 11 refills | Status: DC

## 2017-10-26 NOTE — Progress Notes (Signed)
   Subjective:    Patient ID: [name], female    DOB: 10-Aug-1940, 77 y.o.   MRN: [id number]  HPI  [name] is here for follow-up of her essential hypertension, osteoarthritis, post-menopausal osteoporosis, lichen sclerosis of the vulva, and gastroesophageal reflux disease. Please see the A&P for the status of the pt's chronic medical problems.  Review of Systems  Constitutional: Negative for activity change and fever.  Respiratory: Negative for shortness of breath.   Cardiovascular: Negative for chest pain.  Gastrointestinal: Positive for constipation. Negative for abdominal pain, diarrhea, nausea and vomiting.  Genitourinary: Negative for difficulty urinating, dysuria, vaginal discharge and vaginal pain.       Vulvar itching periodically  Musculoskeletal: Positive for arthralgias. Negative for joint swelling and myalgias.      Objective:   Physical Exam  Constitutional: She is oriented to person, place, and time. She appears well-developed and well-nourished. No distress.  HENT:  Head: Normocephalic and atraumatic.  Eyes: Conjunctivae are normal. Right eye exhibits no discharge. Left eye exhibits no discharge. No scleral icterus.  Cardiovascular: Normal rate and regular rhythm. Exam reveals no gallop and no friction rub.  Murmur heard. Pulmonary/Chest: Effort normal and breath sounds normal. No stridor. No respiratory distress. She has no wheezes. She has no rales.  Abdominal: Soft. There is no tenderness.  Genitourinary:     Neurological: She is alert and oriented to person, place, and time. She exhibits normal muscle tone.  Skin: Skin is warm and dry. She is not diaphoretic. No erythema.  Psychiatric: She has a normal mood and affect. Her behavior is normal. Judgment and thought content normal.  Nursing note and vitals reviewed.     Assessment & Plan:   Please see problem based charting.

## 2017-10-26 NOTE — Assessment & Plan Note (Signed)
She received a flu vaccination today.  She is otherwise up-to-date on her healthcare maintenance.

## 2017-10-26 NOTE — Patient Instructions (Addendum)
It was good to see you again.  You are doing well with your health.  1) Keep taking your medications as you are.  2) For your pain you can try over the counter ibuprofen.  You can try 1 tablet every 6 hours as needed for pain.  Please take with food to protect your stomach.  3) I gave you a coupon for your Betamethasone Dipropionate Ointment 0.05% (Augmented) to take to [facility].  Keep taking this as needed for itching.  It is likely you will need this 2-3 times a week.  When you need another tube ask your daughter to get on the Internet to print you up a new coupon for [facility].  The Internet address is www.goodrx.com and she has to type in the name of the cream like I typed it above and find the [facility] coupon.  It is pretty easy.  If there are any problems she can call us.  4) We drew some blood from you today.  I will call you next week when I get the results.  I will see you back in 6 months, sooner if necessary.

## 2017-10-26 NOTE — Assessment & Plan Note (Signed)
Assessment  She admits she missed the last dose of the alendronate this past week.  It is unclear how adherent she is to the weekly schedule.  This was restarted after she had progressive osteoporosis while on a drug holiday.  Plan  She was asked to continue the alendronate 70 mg by mouth weekly and reminded how important it is given her bone health.  She is eligible for a repeat DEXA scan in December 2020 to assess the efficacy of the bisphosphonate at limiting the progression of her osteoporosis.

## 2017-10-26 NOTE — Assessment & Plan Note (Signed)
Assessment  She continues to have periodic itching in the vulvar area related to her lichen sclerosus.  She has been treated with betamethasone dipropionate 0.05% ointment augmented 1 application every 24 hours as needed.  She finds that she requires a dose 1-3 times per week because of itching.  She is asking for a refill of this medication, but wondering if she could obtain it for less money.  Plan  Looking for alternatives such as hydrocortisone suppositories it appears that the betamethasone is the cheapest first-line therapy available.  We went on goodRx.com and found a coupon to take to her pharmacy at [facility].  This would allow her a significant discount at $21 or so per tube.  I wrote out for her how to obtain this coupon in the future and she will pass this information on to her daughter who helps her with the Internet.  We will reassess the efficacy of this ointment at the follow-up visit.

## 2017-10-26 NOTE — Assessment & Plan Note (Signed)
Assessment  Her blood pressure today is slightly elevated at 143/86.  This is on amlodipine 10 mg by mouth daily and hydrochlorothiazide 25 mg by mouth daily.  Approximately 1 month ago it was 122/80 on the same regimen.  Cost of medications is a major stressor and we will avoid adding an additional agent at this time given the good control she had at the last reading and the borderline elevated reading today.  Plan  We will continue the amlodipine at 10 mg by mouth daily and hydrochlorothiazide 25 mg by mouth daily.  A basic metabolic panel was drawn and is pending at the time of this dictation.  We will reassess the efficacy of this regimen at the follow-up visit.

## 2017-10-26 NOTE — Assessment & Plan Note (Signed)
Assessment  I asked if she was still taking the pantoprazole and she stated she was not.  She has not had acid reflux symptoms recently despite not taking it.  Plan  The pantoprazole was removed from her medication list.  We will monitor for recurrence of symptoms at the follow-up visit.

## 2017-10-26 NOTE — Assessment & Plan Note (Signed)
Assessment  Her osteoarthritic pain is reasonably well controlled on Tylenol as needed.  She will also take an occasional Zanaflex for back spasms.  She no longer takes the tramadol as she is unable to afford it.  She could benefit from over-the-counter nonsteroidal anti-inflammatories.  Plan  She was advised to continue the as needed Tylenol for her arthritis pain and to consider trying over-the-counter ibuprofen, which has worked in the past, as needed with food.  She will report back the efficacy of this over-the-counter dosing of nonsteroidal anti-inflammatories in combination with the Tylenol at the follow-up visit.

## 2017-10-27 LAB — BMP8+ANION GAP
Anion Gap: 18 mmol/L (ref 10.0–18.0)
BUN / CREAT RATIO: 14 (ref 12–28)
BUN: 12 mg/dL (ref 8–27)
CHLORIDE: 101 mmol/L (ref 96–106)
CO2: 22 mmol/L (ref 20–29)
Calcium: 9.8 mg/dL (ref 8.7–10.3)
Creatinine, Ser: 0.84 mg/dL (ref 0.57–1.00)
GFR calc non Af Amer: 67 mL/min/{1.73_m2} (ref >59)
GFR, EST AFRICAN AMERICAN: 78 mL/min/{1.73_m2} (ref >59)
GLUCOSE: 90 mg/dL (ref 65–99)
POTASSIUM: 3.8 mmol/L (ref 3.5–5.2)
Sodium: 141 mmol/L (ref 134–144)

## 2017-10-31 NOTE — Progress Notes (Signed)
Patient ID: [name], female   DOB: [birth year], 77 y.o.   MRN: [id number]  BMP: K 3.8, BUN 12, Cr 0.84, gluc 90, eGFR 78  Will continue the HCTZ at 25 mg daily as potassium level is remaining stable w/o oral KCl supplementation.

## 2017-11-08 DIAGNOSIS — H1851 Endothelial corneal dystrophy: Secondary | ICD-10-CM | POA: Diagnosis not present

## 2017-11-08 DIAGNOSIS — H02403 Unspecified ptosis of bilateral eyelids: Secondary | ICD-10-CM | POA: Diagnosis not present

## 2017-11-08 DIAGNOSIS — H40013 Open angle with borderline findings, low risk, bilateral: Secondary | ICD-10-CM | POA: Diagnosis not present

## 2017-11-16 ENCOUNTER — Other Ambulatory Visit: Payer: Self-pay | Admitting: Internal Medicine

## 2017-11-16 DIAGNOSIS — Z1231 Encounter for screening mammogram for malignant neoplasm of breast: Secondary | ICD-10-CM

## 2017-12-02 ENCOUNTER — Other Ambulatory Visit: Payer: Self-pay | Admitting: Internal Medicine

## 2017-12-02 DIAGNOSIS — I099 Rheumatic heart disease, unspecified: Secondary | ICD-10-CM

## 2017-12-05 ENCOUNTER — Encounter: Payer: Self-pay | Admitting: Internal Medicine

## 2017-12-05 ENCOUNTER — Ambulatory Visit (INDEPENDENT_AMBULATORY_CARE_PROVIDER_SITE_OTHER): Payer: Medicare Other | Admitting: Internal Medicine

## 2017-12-05 ENCOUNTER — Other Ambulatory Visit: Payer: Self-pay

## 2017-12-05 DIAGNOSIS — J069 Acute upper respiratory infection, unspecified: Secondary | ICD-10-CM | POA: Insufficient documentation

## 2017-12-05 DIAGNOSIS — M25562 Pain in left knee: Secondary | ICD-10-CM | POA: Diagnosis not present

## 2017-12-05 DIAGNOSIS — M25561 Pain in right knee: Secondary | ICD-10-CM | POA: Diagnosis not present

## 2017-12-05 MED ORDER — BENZONATATE 100 MG PO CAPS: 100.0000 mg | ORAL_CAPSULE | Freq: Three times a day (TID) | ORAL | 0 refills | Status: DC | PRN

## 2017-12-05 MED ORDER — DEXTROMETHORPHAN-GUAIFENESIN 10-100 MG/5ML PO SYRP: 5.0000 mL | ORAL_SOLUTION | Freq: Two times a day (BID) | ORAL | 0 refills | Status: DC

## 2017-12-05 MED ORDER — FLUTICASONE PROPIONATE 50 MCG/ACT NA SUSP: 1.0000 | Freq: Every day | NASAL | 0 refills | Status: DC

## 2017-12-05 NOTE — Progress Notes (Signed)
   CC: cough  HPI:  Ms.Rome Judie PetitM Annia FriendlyBeard is a 77 y.o. female with history noted below the presents to the acute care clinic for a 2 day history of productive cough with yellow-brown sputum. She has associated symptoms of nasal congestion and post nasal drip. She denies fever/chills, myalgias, sore throat, shortness of breath, wheezing, or chest pain.  She has tried a dose of Mucinex with little benefit. She states the cough is keeping her up at night.  Past Medical History:  Diagnosis Date  . Anemia 03/16/2014  . Atrial flutter (HCC) 05/07/2014   With controlled ventricular rate on no AV nodal agents.  . Chronic rheumatic heart disease 05/05/2014  . Constipation    takes Dulcolax as needed  . 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 Essential hypertension    takes Amlodipine daily  . GERD (gastroesophageal reflux disease)    takes Pantoprazole daily  . History of blood transfusion    no abnormal reaction noted  . History of migraine    last one a week  . History of shingles   . Hypertensive retinopathy of both eyes 03/07/2016   Moderate  . Mitral valve regurgitation, rheumatic 05/05/2014   Moderate to severe with peak PA pressure of 56 mmHg on Echo 04/2014   . Muscle spasm of both lower legs    takes Zanaflex daily as needed  . Nocturia   . Osteoarthritis    knees and hands  . Peripheral edema    takes Lasix daily as needed   . PONV (postoperative nausea and vomiting)    with recent dilatation of throat, had N/V  . Systolic murmur    per patient report she has had rheumatic fever in childhood    Review of Systems:  As noted per history of present illness  Physical Exam:  Vitals:   12/05/17 0918  BP: 125/85  Pulse: 60  Temp: 97.8 F (36.6 C)  TempSrc: Oral  SpO2: 99%  Weight: 202 lb 14.4 oz (92 kg)  Height: 5\' 4"  (1.626 m)   Physical Exam  Constitutional: She is well-developed,  well-nourished, and in no distress.  HENT:  Mouth/Throat: Oropharynx is clear and moist. No oropharyngeal exudate.  Cardiovascular: Normal rate, regular rhythm and normal heart sounds. Exam reveals no gallop and no friction rub.  No murmur heard. Pulmonary/Chest: Effort normal and breath sounds normal. No respiratory distress. She has no wheezes. She has no rales.     Assessment & Plan:   See encounters tab for problem based medical decision making.   Patient discussed with Dr. Heide SparkNarendra

## 2017-12-05 NOTE — Patient Instructions (Signed)
Ms. [name],  It was a pleasure meeting you today. Medications for your cough have been sent to your pharmacy. Please call the clinic if your symptoms do not improve or worsen.

## 2017-12-05 NOTE — Assessment & Plan Note (Addendum)
Assessment: Upper respiratory infection Signs and symptoms consistent with upper respiratory infection. Patient is afebrile and vitals stable.  Lungs sound clear to auscultation on exam. At this time will treat with conservative management including guaifenesin-dextromethorphan, Tessalon Perles and flonase nasal spray.  Plan -guaifenesin-dextromethorphan, Tessalon Perles and flonase nasal spray -told to call the clinic if symptoms do not improve or worsen in the next week

## 2017-12-06 NOTE — Progress Notes (Signed)
Internal Medicine Clinic Attending  Case discussed with Dr. Hoffman at the time of the visit.  We reviewed the resident's history and exam and pertinent patient test results.  I agree with the assessment, diagnosis, and plan of care documented in the resident's note.  

## 2018-01-01 ENCOUNTER — Ambulatory Visit
Admission: RE | Admit: 2018-01-01 | Discharge: 2018-01-01 | Disposition: A | Discharge status: Home / Self Care | Payer: Medicare Other | Source: Ambulatory Visit | Attending: Internal Medicine | Admitting: Internal Medicine

## 2018-01-01 DIAGNOSIS — Z1231 Encounter for screening mammogram for malignant neoplasm of breast: Secondary | ICD-10-CM | POA: Diagnosis not present

## 2018-01-08 ENCOUNTER — Other Ambulatory Visit: Payer: Self-pay | Admitting: Internal Medicine

## 2018-01-17 ENCOUNTER — Ambulatory Visit: Payer: Medicare Other | Admitting: Licensed Clinical Social Worker

## 2018-01-17 ENCOUNTER — Other Ambulatory Visit: Payer: Self-pay

## 2018-01-17 ENCOUNTER — Encounter: Payer: Self-pay | Admitting: Internal Medicine

## 2018-01-17 ENCOUNTER — Ambulatory Visit (INDEPENDENT_AMBULATORY_CARE_PROVIDER_SITE_OTHER): Payer: Medicare Other | Admitting: Internal Medicine

## 2018-01-17 VITALS — BP 136/66 | HR 70 | Temp 98.3°F | Ht 64.0 in | Wt 199.3 lb

## 2018-01-17 DIAGNOSIS — F329 Major depressive disorder, single episode, unspecified: Secondary | ICD-10-CM

## 2018-01-17 DIAGNOSIS — Z0000 Encounter for general adult medical examination without abnormal findings: Secondary | ICD-10-CM | POA: Diagnosis not present

## 2018-01-17 NOTE — Progress Notes (Addendum)
Subjective:   [name] is a 78 y.o. female who presents for a Medicare Annual Wellness Visit.  The following items have been reviewed and updated today in the appropriate area in the EMR.   Health Risk Assessment Pt did not bring health risk assessment with her. Height, weight, BMI, and BP Visual acuity if needed Depression screen Fall risk / safety level Advance directive discussion Medical and family history were reviewed and updated Updating list of other providers & suppliers Medication reconciliation, including over the counter medicines Cognitive screen Written screening schedule Risk Factor list Personalized health advice, risky behaviors, and treatment advice   Current Social History 01/17/2018    Patient lives with spouse in a home which is 1 story. There are not steps up to the entrance the patient uses.   Patient's method of transportation is personal car.  The highest level of education was some high school (9th grade).  The patient currently retired.  Identified important Relationships are Daughter, Adela Lank.   Pets : none   Interests / Fun: Church and window shopping.   Current Stressors: "Disobedient, hard-headed husband" and his health issues.   Religious / Personal Beliefs: "We should love one another; be a blessing and a light for people."       Objective:    Vitals: BP 136/66 (BP Location: Left Arm, Patient Position: Sitting, Cuff Size: Normal)   Pulse 70   Temp 98.3 F (36.8 C) (Oral)   Ht 5\' 4"  (1.626 m)   Wt 199 lb 4.8 oz (90.4 kg)   SpO2 98%   BMI 34.21 kg/m   Activities of Daily Living In your present state of health, do you have any difficulty performing the following activities: 01/17/2018 12/05/2017  Hearing? N N  Vision? Y N  Comment - -  Difficulty concentrating or making decisions? Y Y  Comment - "sometimes"  Walking or climbing stairs? N N  Dressing or bathing? N N  Doing errands, shopping? N N  Some recent  data might be hidden     Goals    . Blood Pressure < 150/90    . Find part-time job (pt-stated)    . Ride stationary bike. Start with once daily and work up to twice daily.     . Weight < 190 lb (86.183 kg)     Patient would appreciate call from Nutritionist to help with healthy food choices to assist in weight loss. Will route to [employer].  Fall Risk Fall Risk  01/17/2018 12/05/2017 10/26/2017 06/07/2017 10/06/2016  Falls in the past year? 0 1 No No No  Number falls in past yr: - 0 - - -  Comment - "was hanging curtains and missed a step on the ladder" - - -  Injury with Fall? - 0 - - -  Risk for fall due to : Impaired balance/gait;Impaired mobility Impaired balance/gait - - -  Risk for fall due to: Comment Left knee weakness  - - - -  Follow up Education provided;Falls prevention discussed Falls prevention discussed - - -    Depression Screen PHQ 2/9 Scores 01/17/2018 12/05/2017 06/07/2017 10/06/2016  PHQ - 2 Score 3 0 0 0  PHQ- 9 Score 11 4 - -  Warm hand-off to [employer]  Cognitive Testing I assessed the patient for cognitive issues and the patient did  have issues with his / her cognition.  Mini-Cog  Failed with score 2/5   Assessment and Plan:    During the  course of the visit the patient was educated and counseled about appropriate screening and preventive services as documented in the assessment and plan.  The printed AVS was given to the patient and included an updated screening schedule, a list of risk factors, and personalized health advice.        Fredderick Severance, RN  01/17/2018

## 2018-01-17 NOTE — Patient Instructions (Addendum)
Annual Wellness Visit   Medicare Covered Preventative Screenings and Services  Services & Screenings Men and Women Who How Often Need? Date of Last Service Action  Abdominal Aortic Aneurysm Adults with AAA risk factors Once    Does not drink alcohol  Alcohol Misuse and Counseling All Adults Screening once a year if no alcohol misuse. Counseling up to 4 face to face sessions. Yes    Bone Density Measurement  Adults at risk for osteoporosis Once every 2 yrs     Lipid Panel Z13.6 All adults without CV disease Once every 5 yrs     Colorectal Cancer   Stool sample or  Colonoscopy All adults 63 and older   Once every year  Every 10 years     Depression All Adults Once a year Yes Today  Meet with [name]  Diabetes Screening Blood glucose, post glucose load, or GTT Z13.1  All adults at risk  Pre-diabetics  Once per year  Twice per year     Diabetes  Self-Management Training All adults Diabetics 10 hrs first year; 2 hours subsequent years. Requires Copay     Glaucoma  Diabetics  Family history of glaucoma  African Americans 16 yrs +  Hispanic Americans 15 yrs + Annually - requires coppay     Hepatitis C Z72.89 or F19.20  High Risk for HCV  Born between 1945 and 1965  Annually  Once     HIV Z11.4 All adults based on risk  Annually btw ages 3 & 69 regardless of risk  Annually > 65 yrs if at increased risk     Lung Cancer Screening Asymptomatic adults aged 35-77 with 30 pack yr history and current smoker OR quit within the last 15 yrs Annually Must have counseling and shared decision making documentation before first screen     Medical Nutrition Therapy Adults with   Diabetes  Renal disease  Kidney transplant within past 3 yrs 3 hours first year; 2 hours subsequent years     Obesity and Counseling All adults Screening once a year Counseling if BMI 30 or higher Yes Today  Consider talking with Butch Penny (Nutritionist)  Tobacco Use Counseling Adults who use tobacco   Up to 8 visits in one year     Vaccines Z23  Hepatitis B  Influenza   Pneumonia  Adults   Once  Once every flu season  Two different vaccines separated by one year     Next Annual Wellness Visit People with Medicare Every year Yes Today     Services & Screenings Women Who How Often Need  Date of Last Service Action  Mammogram  Z12.31 Women over 59 One baseline ages 24-39. Annually ager 40 yrs+     Pap tests All women Annually if high risk. Every 2 yrs for normal risk women     Screening for cervical cancer with   Pap (Z01.419 nl or Z01.411abnl) &  HPV Z11.51 Women aged 22 to 59 Once every 5 yrs     Screening pelvic and breast exams All women Annually if high risk. Every 2 yrs for normal risk women     Sexually Transmitted Diseases  Chlamydia  Gonorrhea  Syphilis All at risk adults Annually for non pregnant females at increased risk         [place] Men Who How Ofter Need  Date of Last Service Action  Prostate Cancer - DRE & PSA Men over 50 Annually.  DRE might require a copay.  Sexually Transmitted Diseases  Syphilis All at risk adults Annually for men at increased risk         Things That May Be Affecting Your Health:  Alcohol  Hearing loss  Pain    Depression  Home Safety  Sexual Health   Diabetes  Lack of physical activity X Stress   Difficulty with daily activities  Loneliness  Tiredness   Drug use  Medicines  Tobacco use   Falls  Motor Vehicle Safety X Weight   Food choices  Oral Health  Other    YOUR PERSONALIZED HEALTH PLAN : 1. Schedule your next subsequent Medicare Wellness visit in one year 2. Attend all of your regular appointments to address your medical issues 3. Complete the preventative screenings and services 4. Find part-time job 5. Ride stationary bike    Fall Prevention in the Home, Adult Falls can cause injuries. They can happen to people of all ages. There are many things you can do to make your home safe and  to help prevent falls. Ask for help when making these changes, if needed. What actions can I take to prevent falls? General Instructions  Use good lighting in all rooms. Replace any light bulbs that burn out.  Turn on the lights when you go into a dark area. Use night-lights.  Keep items that you use often in easy-to-reach places. Lower the shelves around your home if necessary.  Set up your furniture so you have a clear path. Avoid moving your furniture around.  Do not have throw rugs and other things on the floor that can make you trip.  Avoid walking on wet floors.  If any of your floors are uneven, fix them.  Add color or contrast paint or tape to clearly mark and help you see: ? Any grab bars or handrails. ? First and last steps of stairways. ? Where the edge of each step is.  If you use a stepladder: ? Make sure that it is fully opened. Do not climb a closed stepladder. ? Make sure that both sides of the stepladder are locked into place. ? Ask someone to hold the stepladder for you while you use it.  If there are any pets around you, be aware of where they are. What can I do in the bathroom?      Keep the floor dry. Clean up any water that spills onto the floor as soon as it happens.  Remove soap buildup in the tub or shower regularly.  Use non-skid mats or decals on the floor of the tub or shower.  Attach bath mats securely with double-sided, non-slip rug tape.  If you need to sit down in the shower, use a plastic, non-slip stool.  Install grab bars by the toilet and in the tub and shower. Do not use towel bars as grab bars. What can I do in the bedroom?  Make sure that you have a light by your bed that is easy to reach.  Do not use any sheets or blankets that are too big for your bed. They should not hang down onto the floor.  Have a firm chair that has side arms. You can use this for support while you get dressed. What can I do in the kitchen?  Clean up  any spills right away.  If you need to reach something above you, use a strong step stool that has a grab bar.  Keep electrical cords out of the way.  Do not use  floor polish or wax that makes floors slippery. If you must use wax, use non-skid floor wax. What can I do with my stairs?  Do not leave any items on the stairs.  Make sure that you have a light switch at the top of the stairs and the bottom of the stairs. If you do not have them, ask someone to add them for you.  Make sure that there are handrails on both sides of the stairs, and use them. Fix handrails that are broken or loose. Make sure that handrails are as long as the stairways.  Install non-slip stair treads on all stairs in your home.  Avoid having throw rugs at the top or bottom of the stairs. If you do have throw rugs, attach them to the floor with carpet tape.  Choose a carpet that does not hide the edge of the steps on the stairway.  Check any carpeting to make sure that it is firmly attached to the stairs. Fix any carpet that is loose or worn. What can I do on the outside of my home?  Use bright outdoor lighting.  Regularly fix the edges of walkways and driveways and fix any cracks.  Remove anything that might make you trip as you walk through a door, such as a raised step or threshold.  Trim any bushes or trees on the path to your home.  Regularly check to see if handrails are loose or broken. Make sure that both sides of any steps have handrails.  Install guardrails along the edges of any raised decks and porches.  Clear walking paths of anything that might make someone trip, such as tools or rocks.  Have any leaves, snow, or ice cleared regularly.  Use sand or salt on walking paths during winter.  Clean up any spills in your garage right away. This includes grease or oil spills. What other actions can I take?  Wear shoes that: ? Have a low heel. Do not wear high heels. ? Have rubber  bottoms. ? Are comfortable and fit you well. ? Are closed at the toe. Do not wear open-toe sandals.  Use tools that help you move around (mobility aids) if they are needed. These include: ? Canes. ? Walkers. ? Scooters. ? Crutches.  Review your medicines with your doctor. Some medicines can make you feel dizzy. This can increase your chance of falling. Ask your doctor what other things you can do to help prevent falls. Where to find more information  Centers for Disease Control and Prevention, STEADI: https://garcia.biz/  [employer] on Aging: BrainJudge.co.uk Contact a doctor if:  You are afraid of falling at home.  You feel weak, drowsy, or dizzy at home.  You fall at home. Summary  There are many simple things that you can do to make your home safe and to help prevent falls.  Ways to make your home safe include removing tripping hazards and installing grab bars in the bathroom.  Ask for help when making these changes in your home. This information is not intended to replace advice given to you by your health care provider. Make sure you discuss any questions you have with your health care provider. Document Released: 10/15/2008 Document Revised: 08/03/2016 Document Reviewed: 08/03/2016 Elsevier Interactive Patient Education  2019 [place] Maintenance, Female Adopting a healthy lifestyle and getting preventive care can go a long way to promote health and wellness. Talk with your health care provider about what schedule of regular examinations is  right for you. This is a good chance for you to check in with your provider about disease prevention and staying healthy. In between checkups, there are plenty of things you can do on your own. Experts have done a lot of research about which lifestyle changes and preventive measures are most likely to keep you healthy. Ask your health care provider for more information. Weight and diet Eat a healthy  diet  Be sure to include plenty of vegetables, fruits, low-fat dairy products, and lean protein.  Do not eat a lot of foods high in solid fats, added sugars, or salt.  Get regular exercise. This is one of the most important things you can do for your health. ? Most adults should exercise for at least 150 minutes each week. The exercise should increase your heart rate and make you sweat (moderate-intensity exercise). ? Most adults should also do strengthening exercises at least twice a week. This is in addition to the moderate-intensity exercise. Maintain a healthy weight  Body mass index (BMI) is a measurement that can be used to identify possible weight problems. It estimates body fat based on height and weight. Your health care provider can help determine your BMI and help you achieve or maintain a healthy weight.  For females 18 years of age and older: ? A BMI below 18.5 is considered underweight. ? A BMI of 18.5 to 24.9 is normal. ? A BMI of 25 to 29.9 is considered overweight. ? A BMI of 30 and above is considered obese. Watch levels of cholesterol and blood lipids  You should start having your blood tested for lipids and cholesterol at 78 years of age, then have this test every 5 years.  You may need to have your cholesterol levels checked more often if: ? Your lipid or cholesterol levels are high. ? You are older than 78 years of age. ? You are at high risk for heart disease. Cancer screening Lung Cancer  Lung cancer screening is recommended for adults 12-60 years old who are at high risk for lung cancer because of a history of smoking.  A yearly low-dose CT scan of the lungs is recommended for people who: ? Currently smoke. ? Have quit within the past 15 years. ? Have at least a 30-pack-year history of smoking. A pack year is smoking an average of one pack of cigarettes a day for 1 year.  Yearly screening should continue until it has been 15 years since you quit.  Yearly  screening should stop if you develop a health problem that would prevent you from having lung cancer treatment. Breast Cancer  Practice breast self-awareness. This means understanding how your breasts normally appear and feel.  It also means doing regular breast self-exams. Let your health care provider know about any changes, no matter how small.  If you are in your 20s or 30s, you should have a clinical breast exam (CBE) by a health care provider every 1-3 years as part of a regular health exam.  If you are 48 or older, have a CBE every year. Also consider having a breast X-ray (mammogram) every year.  If you have a family history of breast cancer, talk to your health care provider about genetic screening.  If you are at high risk for breast cancer, talk to your health care provider about having an MRI and a mammogram every year.  Breast cancer gene (BRCA) assessment is recommended for women who have family members with BRCA-related cancers. BRCA-related cancers  include: ? Breast. ? Ovarian. ? Tubal. ? Peritoneal cancers.  Results of the assessment will determine the need for genetic counseling and BRCA1 and BRCA2 testing. Cervical Cancer Your health care provider may recommend that you be screened regularly for cancer of the pelvic organs (ovaries, uterus, and vagina). This screening involves a pelvic examination, including checking for microscopic changes to the surface of your cervix (Pap test). You may be encouraged to have this screening done every 3 years, beginning at age 37.  For women ages 76-65, health care providers may recommend pelvic exams and Pap testing every 3 years, or they may recommend the Pap and pelvic exam, combined with testing for human papilloma virus (HPV), every 5 years. Some types of HPV increase your risk of cervical cancer. Testing for HPV may also be done on women of any age with unclear Pap test results.  Other health care providers may not recommend any  screening for nonpregnant women who are considered low risk for pelvic cancer and who do not have symptoms. Ask your health care provider if a screening pelvic exam is right for you.  If you have had past treatment for cervical cancer or a condition that could lead to cancer, you need Pap tests and screening for cancer for at least 20 years after your treatment. If Pap tests have been discontinued, your risk factors (such as having a new sexual partner) need to be reassessed to determine if screening should resume. Some women have medical problems that increase the chance of getting cervical cancer. In these cases, your health care provider may recommend more frequent screening and Pap tests. Colorectal Cancer  This type of cancer can be detected and often prevented.  Routine colorectal cancer screening usually begins at 78 years of age and continues through 78 years of age.  Your health care provider may recommend screening at an earlier age if you have risk factors for colon cancer.  Your health care provider may also recommend using home test kits to check for hidden blood in the stool.  A small camera at the end of a tube can be used to examine your colon directly (sigmoidoscopy or colonoscopy). This is done to check for the earliest forms of colorectal cancer.  Routine screening usually begins at age 56.  Direct examination of the colon should be repeated every 5-10 years through 78 years of age. However, you may need to be screened more often if early forms of precancerous polyps or small growths are found. Skin Cancer  Check your skin from head to toe regularly.  Tell your health care provider about any new moles or changes in moles, especially if there is a change in a mole's shape or color.  Also tell your health care provider if you have a mole that is larger than the size of a pencil eraser.  Always use sunscreen. Apply sunscreen liberally and repeatedly throughout the  day.  Protect yourself by wearing long sleeves, pants, a wide-brimmed hat, and sunglasses whenever you are outside. Heart disease, diabetes, and high blood pressure  High blood pressure causes heart disease and increases the risk of stroke. High blood pressure is more likely to develop in: ? People who have blood pressure in the high end of the normal range (130-139/85-89 mm Hg). ? People who are overweight or obese. ? People who are African American.  If you are 81-70 years of age, have your blood pressure checked every 3-5 years. If you are 40 years of  age or older, have your blood pressure checked every year. You should have your blood pressure measured twice-once when you are at a hospital or clinic, and once when you are not at a hospital or clinic. Record the average of the two measurements. To check your blood pressure when you are not at a hospital or clinic, you can use: ? An automated blood pressure machine at a pharmacy. ? A home blood pressure monitor.  If you are between 58 years and 58 years old, ask your health care provider if you should take aspirin to prevent strokes.  Have regular diabetes screenings. This involves taking a blood sample to check your fasting blood sugar level. ? If you are at a normal weight and have a low risk for diabetes, have this test once every three years after 78 years of age. ? If you are overweight and have a high risk for diabetes, consider being tested at a younger age or more often. Preventing infection Hepatitis B  If you have a higher risk for hepatitis B, you should be screened for this virus. You are considered at high risk for hepatitis B if: ? You were born in a country where hepatitis B is common. Ask your health care provider which countries are considered high risk. ? Your parents were born in a high-risk country, and you have not been immunized against hepatitis B (hepatitis B vaccine). ? You have HIV or AIDS. ? You use needles to  inject street drugs. ? You live with someone who has hepatitis B. ? You have had sex with someone who has hepatitis B. ? You get hemodialysis treatment. ? You take certain medicines for conditions, including cancer, organ transplantation, and autoimmune conditions. Hepatitis C  Blood testing is recommended for: ? Everyone born from [birth year] through 1965. ? Anyone with known risk factors for hepatitis C. Sexually transmitted infections (STIs)  You should be screened for sexually transmitted infections (STIs) including gonorrhea and chlamydia if: ? You are sexually active and are younger than 78 years of age. ? You are older than 78 years of age and your health care provider tells you that you are at risk for this type of infection. ? Your sexual activity has changed since you were last screened and you are at an increased risk for chlamydia or gonorrhea. Ask your health care provider if you are at risk.  If you do not have HIV, but are at risk, it may be recommended that you take a prescription medicine daily to prevent HIV infection. This is called pre-exposure prophylaxis (PrEP). You are considered at risk if: ? You are sexually active and do not regularly use condoms or know the HIV status of your partner(s). ? You take drugs by injection. ? You are sexually active with a partner who has HIV. Talk with your health care provider about whether you are at high risk of being infected with HIV. If you choose to begin PrEP, you should first be tested for HIV. You should then be tested every 3 months for as long as you are taking PrEP. Pregnancy  If you are premenopausal and you may become pregnant, ask your health care provider about preconception counseling.  If you may become pregnant, take 400 to 800 micrograms (mcg) of folic acid every day.  If you want to prevent pregnancy, talk to your health care provider about birth control (contraception). Osteoporosis and menopause  Osteoporosis is a  disease in which the bones lose minerals and  strength with aging. This can result in serious bone fractures. Your risk for osteoporosis can be identified using a bone density scan.  If you are 22 years of age or older, or if you are at risk for osteoporosis and fractures, ask your health care provider if you should be screened.  Ask your health care provider whether you should take a calcium or vitamin D supplement to lower your risk for osteoporosis.  Menopause may have certain physical symptoms and risks.  Hormone replacement therapy may reduce some of these symptoms and risks. Talk to your health care provider about whether hormone replacement therapy is right for you. Follow these instructions at home:  Schedule regular health, dental, and eye exams.  Stay current with your immunizations.  Do not use any tobacco products including cigarettes, chewing tobacco, or electronic cigarettes.  If you are pregnant, do not drink alcohol.  If you are breastfeeding, limit how much and how often you drink alcohol.  Limit alcohol intake to no more than 1 drink per day for nonpregnant women. One drink equals 12 ounces of beer, 5 ounces of wine, or 1 ounces of hard liquor.  Do not use street drugs.  Do not share needles.  Ask your health care provider for help if you need support or information about quitting drugs.  Tell your health care provider if you often feel depressed.  Tell your health care provider if you have ever been abused or do not feel safe at home. This information is not intended to replace advice given to you by your health care provider. Make sure you discuss any questions you have with your health care provider. Document Released: 07/04/2010 Document Revised: 05/27/2015 Document Reviewed: 09/22/2014 Elsevier Interactive Patient Education  2019 [employer].

## 2018-01-19 ENCOUNTER — Other Ambulatory Visit: Payer: Self-pay | Admitting: Internal Medicine

## 2018-01-19 DIAGNOSIS — M810 Age-related osteoporosis without current pathological fracture: Secondary | ICD-10-CM

## 2018-01-21 ENCOUNTER — Other Ambulatory Visit: Payer: Self-pay | Admitting: Internal Medicine

## 2018-01-21 DIAGNOSIS — M810 Age-related osteoporosis without current pathological fracture: Secondary | ICD-10-CM

## 2018-01-22 ENCOUNTER — Ambulatory Visit: Payer: Medicare Other

## 2018-01-22 ENCOUNTER — Telehealth: Payer: Self-pay | Admitting: Licensed Clinical Social Worker

## 2018-01-22 ENCOUNTER — Other Ambulatory Visit: Payer: Self-pay | Admitting: Internal Medicine

## 2018-01-22 ENCOUNTER — Encounter: Payer: Self-pay | Admitting: Licensed Clinical Social Worker

## 2018-01-22 ENCOUNTER — Other Ambulatory Visit: Payer: Self-pay

## 2018-01-22 ENCOUNTER — Ambulatory Visit (INDEPENDENT_AMBULATORY_CARE_PROVIDER_SITE_OTHER): Payer: Medicare Other | Admitting: Internal Medicine

## 2018-01-22 DIAGNOSIS — M810 Age-related osteoporosis without current pathological fracture: Secondary | ICD-10-CM

## 2018-01-22 DIAGNOSIS — Z7901 Long term (current) use of anticoagulants: Secondary | ICD-10-CM | POA: Diagnosis not present

## 2018-01-22 DIAGNOSIS — M79601 Pain in right arm: Secondary | ICD-10-CM | POA: Diagnosis not present

## 2018-01-22 MED ORDER — ALENDRONATE SODIUM 70 MG PO TABS
70.0000 mg | ORAL_TABLET | ORAL | 11 refills | Status: DC
Start: 2018-01-22 — End: 2018-01-22

## 2018-01-22 MED ORDER — ALENDRONATE SODIUM 70 MG PO TABS: 70.0000 mg | ORAL_TABLET | ORAL | 11 refills | Status: DC

## 2018-01-22 NOTE — Progress Notes (Signed)
   CC: right arm pain  HPI:[name] is a 78 y.o. female who presents for evaluation of right arm pain when reaching above parallel with the floor. Please see individual problem based A/P for details.   PHQ-9: Based on the patients    [facility]  PHQ-9 Total Score  5     score we have decided to monitor.  Past Medical History:  Diagnosis Date  . Anemia 03/16/2014  . Atrial flutter (HCC) 05/07/2014   With controlled ventricular rate on no AV nodal agents.  . Chronic rheumatic heart disease 05/05/2014  . Constipation    takes Dulcolax as needed  . 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 Essential hypertension    takes Amlodipine daily  . GERD (gastroesophageal reflux disease)    takes Pantoprazole daily  . History of blood transfusion    no abnormal reaction noted  . History of migraine    last one a week  . History of shingles   . Hypertensive retinopathy of both eyes 03/07/2016   Moderate  . Mitral valve regurgitation, rheumatic 05/05/2014   Moderate to severe with peak PA pressure of 56 mmHg on Echo 04/2014   . Muscle spasm of both lower legs    takes Zanaflex daily as needed  . Nocturia   . Osteoarthritis    knees and hands  . Peripheral edema    takes Lasix daily as needed   . PONV (postoperative nausea and vomiting)    with recent dilatation of throat, had N/V  . Systolic murmur    per patient report she has had rheumatic fever in childhood   Review of Systems: ROS negative except as per HPI  Physical Exam: Vitals:   01/22/18 1425  BP: (!) 141/77  Pulse: 66  Temp: 98.1 F (36.7 C)  TempSrc: Oral  SpO2: 98%  Weight: 203 lb 8 oz (92.3 kg)  Height: 5\' 4"  (1.626 m)   General: A/O x4, in no acute distress, afebrile, nondiaphoretic HEENT: PEERL, CNII-XII grossly intact, EMO intact Cardio: RRR, no  mrg's Pulmonary: CTA bilaterally MSK: There is no appreciable weakness of the right shoulder.  Range of motion evaluation is equal to the left does not elicit pain. There are not points of tenderness when palpating the AC join, deltoid, distal biceps tendons, cervical spine, clavicle. Minor tenderness to touch that is not reproducible of the medial proximal bicep. No visible deformity or rash. Strength is 5/5 equal and bilateral.   Assessment & Plan:   See Encounters Tab for problem based charting.  Patient discussed with Dr. Rogelia BogaButcher

## 2018-01-22 NOTE — Patient Instructions (Signed)
FOLLOW-UP INSTRUCTIONS When: Please stop at the front desk and schedule an appointment with Dr. Josem Kaufmann for May-June For: A routine visit What to bring: All of your medications  I have refilled the Alendronate 70mg  to be taken once per week today. Please be sure to call us if you run out as soon as you know your supply is low.   Regarding your arm pain, I think this will improve over time. If it does not, please call us and schedule an acute visit with one of our doctors in the next few weeks. You may try tylenol 500mg  2-3 times per day as needed if the pain persists.   Thank you for your visit to the [facility] today. If you have any questions or concerns please call us at [phone].

## 2018-01-22 NOTE — Telephone Encounter (Signed)
Duplicate request-refill refused as same request as been sent to pcp for review.Abigail Spittle Cassady1/21/20201:40 PM

## 2018-01-22 NOTE — Assessment & Plan Note (Signed)
Osteoporosis: Alendronate 70mg  weekly was ordered on 01/19/2018. Patient had been without x6 weeks as she forgot to request this. It appears that she was to continue taking this as per chart review and I do not see a contraindication.  Refilled Alendronate 70mg  weekly today.

## 2018-01-22 NOTE — Assessment & Plan Note (Signed)
  Right arm pain: This pain began approximate 3.5 weeks prior while moving furniture for Christmas.  She states that the pain is at worst 3-4/10 and intermittent.  It is not aching in nature but could be described as sharp.  She only notices the pain when raising her right arm in a particular manner while flexing her bicep.  She denied associated chest pain, nausea, headache, dyspnea, shoulder pain, elbow pain, tricep pain, back pain, rib pain, or other affected area.  There is no weakness that she is observed.  She denies numbness, tingling, or other concerning symptom. I have advised her to have her grandchildren move furniture for her in the future  Plan: I am unable to elicit evidence of a rotator cuff injury nor do I have a history to support this.  Given her strength as well as range of motion as compared to the left, I feel she will improve with time as this is likely a musculoskeletal inflammatory process. Considering the patient's age and her current medications, i.e. Eliquis, I feel it would be prudent to avoid NSAIDs.  In addition, given the intermittent nature of the pain I feel that long-term NSAIDs or Tylenol will most likely not be of notable benefit.  I informed her that with time and continued use the pain should improve but if not, she may return for checkup. Per her request I have recommended Tylenol as needed maximum dose of 500mg  2-3 times a day if the pain persists/worsens while awaiting an appointment.

## 2018-01-22 NOTE — Telephone Encounter (Signed)
Prescription refilled today by Dr. Crista Elliot in [facility].

## 2018-01-22 NOTE — BH Specialist Note (Signed)
Integrated Behavioral Health Initial Visit  MRN: [id number] Name: [name]  Number of Integrated Behavioral Health Clinician visits:: 1/6 Session Start time: 2:32  Session End time: 2:41 Total time: 9 minutes  Type of Service: Integrated Behavioral Health- Individual/Family Interpretor:No.    Warm Hand Off Completed. Yes, by Dr. Josem Kaufmann.       SUBJECTIVE: [name] is a 77 y.o. female  whom attended the session individually.  Patient was referred by Dr. Josem Kaufmann for elevated PHQ-9 screen at her annual wellness visit. Patient reports the following symptoms/concerns: depression symptoms, family challenges, and health issues.  Duration of problem: increased over the past year; Severity of problem: mild  OBJECTIVE: Mood: Depressed and Hopeless and Affect: Depressed Risk of harm to self or others: No plan to harm self or others  LIFE CONTEXT: Family and Social: Patient lives with her spouse. Patient reported having challenges related to her spouse.  Self-Care: Patient wants to work on improving her self-care. Patient's faith is one of her main coping skills. Patient spends a great deal of her time focused on her husband's care.  Life Changes: None reported.   GOALS ADDRESSED: Patient will: 1. Reduce symptoms of: depression and stress 2. Increase knowledge and/or ability of: coping skills, healthy habits and stress reduction  3. Demonstrate ability to: Increase healthy adjustment to current life circumstances and Increase adequate support systems for patient/family  INTERVENTIONS: Interventions utilized: Motivational Interviewing and Supportive Counseling. MI was used to identify what coping skills the patient is utilizing to reduce her depressive symptoms. The therapist assessed for SI, HI, and self-harm due to an elevated PHQ-9 screen.  Standardized Assessments completed: PHQ 9  ASSESSMENT: Patient currently experiencing increased depressive symptoms. Patient  reported feeling down/hopeless for around one year. Patient has no thoughts of hurting herself or others. Patient would like to begin counseling to explore ways to cope with periods of sadness.    Patient may benefit from bi-weekly counseling.  PLAN: 1. Follow up with behavioral health clinician on : two weeks.   2. Referral(s): Integrated [facility] [place], [place], [place]

## 2018-01-22 NOTE — Telephone Encounter (Signed)
Patient was contacted due to a recent referral. Patient did not answer. A voicemail was left for the patient to contact the office back to schedule an appointment.

## 2018-01-24 NOTE — Progress Notes (Signed)
Internal Medicine Clinic Attending  Case discussed with Dr. Harbrecht at the time of the visit.  We reviewed the resident's history and exam and pertinent patient test results.  I agree with the assessment, diagnosis, and plan of care documented in the resident's note.   

## 2018-01-31 ENCOUNTER — Other Ambulatory Visit: Payer: Self-pay

## 2018-01-31 ENCOUNTER — Encounter: Payer: Self-pay | Admitting: Internal Medicine

## 2018-01-31 ENCOUNTER — Ambulatory Visit (INDEPENDENT_AMBULATORY_CARE_PROVIDER_SITE_OTHER): Payer: Medicare Other | Admitting: Internal Medicine

## 2018-01-31 ENCOUNTER — Other Ambulatory Visit: Payer: Self-pay | Admitting: Internal Medicine

## 2018-01-31 VITALS — BP 135/71 | HR 58 | Temp 98.2°F | Ht 64.0 in | Wt 201.3 lb

## 2018-01-31 DIAGNOSIS — R05 Cough: Secondary | ICD-10-CM

## 2018-01-31 DIAGNOSIS — I4892 Unspecified atrial flutter: Secondary | ICD-10-CM

## 2018-01-31 DIAGNOSIS — I10 Essential (primary) hypertension: Secondary | ICD-10-CM | POA: Diagnosis not present

## 2018-01-31 DIAGNOSIS — M159 Polyosteoarthritis, unspecified: Secondary | ICD-10-CM

## 2018-01-31 DIAGNOSIS — I051 Rheumatic mitral insufficiency: Secondary | ICD-10-CM | POA: Diagnosis not present

## 2018-01-31 DIAGNOSIS — M79601 Pain in right arm: Secondary | ICD-10-CM

## 2018-01-31 DIAGNOSIS — Z7901 Long term (current) use of anticoagulants: Secondary | ICD-10-CM

## 2018-01-31 DIAGNOSIS — Z79899 Other long term (current) drug therapy: Secondary | ICD-10-CM

## 2018-01-31 DIAGNOSIS — R059 Cough, unspecified: Secondary | ICD-10-CM

## 2018-01-31 MED ORDER — FLUTICASONE PROPIONATE 50 MCG/ACT NA SUSP: 1.0000 | Freq: Every day | NASAL | 0 refills | Status: DC

## 2018-01-31 MED ORDER — BENZONATATE 100 MG PO CAPS: 100.0000 mg | ORAL_CAPSULE | Freq: Three times a day (TID) | ORAL | 0 refills | Status: DC | PRN

## 2018-01-31 NOTE — Progress Notes (Signed)
   CC: arm pain  HPI:  [name] is a 78 y.o. with a PMH of atrial flutter on eliquis, HTN, OA, rheumatic heart disease with MR presenting to clinic for right arm pain and cough.  Patient endorses a couple week history of right arm pain. She was evaluated last week by Dr. Crista ElliotHarbrecht and recommended to take Tylenol PRN; at that time no concerning exam findings were apparent. Since then, patient states the pain has progressed from involving just her mid-upper arm, to now having pain in her anterior shoulder and right neck. Her husband has frequent falls and she is the only caretaker most of the time to help him; she denies other trauma, arm weakness, numbness/tingling. She notes tylenol does help for about 5 hours; she has not tried anything else and has not been taking muscle relaxants for several months.  Patient also endorses about 2 wk history of cough that is minimally productive of clear sputum; cough is worse at night and she notes post-nasal drip when trying to sleep. She denies fevers, chest pain, shortness of breath.  Please see problem based Assessment and Plan for status of patients chronic conditions.  Past Medical History:  Diagnosis Date  . Anemia 03/16/2014  . Atrial flutter (HCC) 05/07/2014   With controlled ventricular rate on no AV nodal agents.  . Chronic rheumatic heart disease 05/05/2014  . Constipation    takes Dulcolax as needed  . 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 Essential hypertension    takes Amlodipine daily  . GERD (gastroesophageal reflux disease)    takes Pantoprazole daily  . History of blood transfusion    no abnormal reaction noted  . History of migraine    last one a week  . History of shingles   . Hypertensive retinopathy of both eyes 03/07/2016   Moderate  . Mitral valve regurgitation, rheumatic 05/05/2014   Moderate to severe with peak PA  pressure of 56 mmHg on Echo 04/2014   . Muscle spasm of both lower legs    takes Zanaflex daily as needed  . Nocturia   . Osteoarthritis    knees and hands  . Peripheral edema    takes Lasix daily as needed   . PONV (postoperative nausea and vomiting)    with recent dilatation of throat, had N/V  . Systolic murmur    per patient report she has had rheumatic fever in childhood    Review of Systems:   Per HPI  Physical Exam:  Vitals:   01/31/18 0914  BP: 135/71  Pulse: (!) 58  Temp: 98.2 F (36.8 C)  TempSrc: Oral  SpO2: 100%  Weight: 201 lb 4.8 oz (91.3 kg)  Height: 5\' 4"  (1.626 m)   GENERAL- alert, co-operative, appears as stated age, not in any distress. HEENT- oral mucosa appears moist, nasal turbinate congestion with rhinorrhea. CARDIAC- RRR RESP- Moving equal volumes of air, and clear to auscultation bilaterally, no wheezes or crackles. EXTREMITIES- pulse 2+ radial, symmetric; no LE edema.  RUE: TTP of bicep tendon at insertion point as well as cervical paraspinal mm; full passive range of motion. Pain with internal rotation, abduction. Strength and sensation intact.  Assessment & Plan:   See Encounters Tab for problem based charting.   Patient discussed with Dr. Argentina PonderGranfortuna   Renisha Cockrum, MD Internal Medicine PGY-3

## 2018-01-31 NOTE — Assessment & Plan Note (Signed)
Patient with likely muscle spasm and tendinitis in RUE due to lifting and assisting her husband with transfers and after frequent falls. She has had improvement in pain with tylenol.  Plan: --continue tylenol 500mg  q6-8hours prn --advised use of heat, Aspercreme --if no improvement in 3-4 weeks, can consider u/s to evaluate for tendon tear

## 2018-01-31 NOTE — Patient Instructions (Signed)
For your arm and neck pain, continue using tylenol 500mg  every 8 hours as needed. Get over the counter Aspercreme and apply to the areas that hurt (neck and arm).  For your cough, use flonase nasal spray every day; use the dextromethorphan-guaifenacin syrup twice a day as needed. I have refilled the cough medicine.

## 2018-01-31 NOTE — Assessment & Plan Note (Addendum)
Patient with minimally productive cough mainly at night 2/2 post-nasal drip. No signs of HF or pna.   Plan: --refill tessalon perles --advised use of daily flonase --prn dextromethorphan-guaifenesin BID

## 2018-02-01 NOTE — Progress Notes (Signed)
Medicine attending: Medical history, presenting problems, physical findings, and medications, reviewed with resident physician Dr Gorica Svalina on the day of the patient visit and I concur with her evaluation and management plan. 

## 2018-03-15 ENCOUNTER — Other Ambulatory Visit: Payer: Self-pay | Admitting: Internal Medicine

## 2018-03-15 ENCOUNTER — Other Ambulatory Visit: Payer: Self-pay

## 2018-03-15 ENCOUNTER — Encounter: Payer: Self-pay | Admitting: Internal Medicine

## 2018-03-15 ENCOUNTER — Ambulatory Visit (INDEPENDENT_AMBULATORY_CARE_PROVIDER_SITE_OTHER): Payer: Medicare Other | Admitting: Internal Medicine

## 2018-03-15 VITALS — BP 133/68 | HR 54 | Temp 98.1°F | Ht 64.0 in | Wt 200.4 lb

## 2018-03-15 DIAGNOSIS — R05 Cough: Secondary | ICD-10-CM

## 2018-03-15 DIAGNOSIS — I10 Essential (primary) hypertension: Secondary | ICD-10-CM | POA: Diagnosis not present

## 2018-03-15 DIAGNOSIS — R059 Cough, unspecified: Secondary | ICD-10-CM

## 2018-03-15 DIAGNOSIS — M810 Age-related osteoporosis without current pathological fracture: Secondary | ICD-10-CM

## 2018-03-15 DIAGNOSIS — I4892 Unspecified atrial flutter: Secondary | ICD-10-CM

## 2018-03-15 DIAGNOSIS — M8949 Other hypertrophic osteoarthropathy, multiple sites: Secondary | ICD-10-CM

## 2018-03-15 DIAGNOSIS — N904 Leukoplakia of vulva: Secondary | ICD-10-CM

## 2018-03-15 DIAGNOSIS — Z79899 Other long term (current) drug therapy: Secondary | ICD-10-CM

## 2018-03-15 DIAGNOSIS — Z7983 Long term (current) use of bisphosphonates: Secondary | ICD-10-CM

## 2018-03-15 DIAGNOSIS — K5909 Other constipation: Secondary | ICD-10-CM

## 2018-03-15 DIAGNOSIS — M150 Primary generalized (osteo)arthritis: Secondary | ICD-10-CM

## 2018-03-15 DIAGNOSIS — M159 Polyosteoarthritis, unspecified: Secondary | ICD-10-CM

## 2018-03-15 DIAGNOSIS — Z7901 Long term (current) use of anticoagulants: Secondary | ICD-10-CM

## 2018-03-15 MED ORDER — FLUTICASONE PROPIONATE 50 MCG/ACT NA SUSP: 1.0000 | Freq: Every day | NASAL | 0 refills | Status: DC

## 2018-03-15 MED ORDER — POTASSIUM CHLORIDE CRYS ER 20 MEQ PO TBCR: 20.0000 meq | EXTENDED_RELEASE_TABLET | Freq: Every day | ORAL | 3 refills | Status: DC

## 2018-03-15 MED ORDER — POLYETHYLENE GLYCOL 3350 17 GM/SCOOP PO POWD: 1.0000 | Freq: Every day | ORAL | 1 refills | Status: DC | PRN

## 2018-03-15 NOTE — Assessment & Plan Note (Signed)
Assessment  Her blood pressure is well controlled today at 133/68.  This is on amlodipine 10 mg by mouth daily and hydrochlorothiazide 25 mg by mouth daily.  Plan  We will continue the amlodipine at 10 mg by mouth daily and hydrochlorothiazide at 25 mg by mouth daily.  We will reassess the efficacy of this therapy in controlling her blood pressure at the follow-up visit.

## 2018-03-15 NOTE — Assessment & Plan Note (Signed)
Assessment  Apparently she developed an acute cough in late January associated with sputum production that occurred mainly at night.  She denied any sensation of heartburn, orthopnea, or paroxysmal nocturnal dyspnea.  She was prescribed a cough syrup over-the-counter but was unable to afford it.  She continues to have a nocturnal cough and is requesting I represcribed the cough syrup.  This is likely a subacute cough secondary to a persistent postinfectious process.  Plan  We went on the [facility] website and printed a $2 off coupon for over-the-counter Robitussin cough/chest congestion/DM cough syrup.  She will purchase this today and we will reassess whether or not her cough persists at the follow-up visit.

## 2018-03-15 NOTE — Assessment & Plan Note (Signed)
Assessment  She has had chronic constipation and has tried many agents including MiraLAX, senna, Dulcolax, and sorbitol.  She has purchased an over-the-counter product called colon cleanse which includes fiber and Senokot.  She states this works fairly well although at times she could use a more stronger agent.  Plan  We decided to prescribe a GoLYTELY like powder polyethylene glycol 1 heaping tablespoon in 4 to 8 ounces of water once daily as needed for severe constipation.  She was advised not to take this on a daily basis but only if the constipation was severe.  Since she felt she was getting some relief with the colon cleanse that had fiber and Senokot she wished to continue this.  We will reassess the efficacy of the polyethylene glycol powder for the severe constipation at the follow-up visit.  Of note, we printed a good Rx coupon so that she could purchase it today at [facility] for less than $9 a bottle.

## 2018-03-15 NOTE — Assessment & Plan Note (Signed)
She is up-to-date on her routine healthcare maintenance.

## 2018-03-15 NOTE — Patient Instructions (Addendum)
It was wonderful to see you today.  You are doing a good job taking care of yourself.  1) I prescribed polyethylene glycol powder 1 heaping tablespoon (bottle cap filled to the line) in 4-8 oz (glass) of water once in a day only as you need for severe constipation.  Keep taking your colon cleanser tablets otherwise as they seem to work.  Don't forget to use the coupon I gave you as you can get a bottle for less than $9 today.  2) Your [facility] card has the Robitussin 8 oz cough/chest congestion/DM coupon for your cough.  3) I refilled medications that you needed refilled.  Please take all of your medications as prescribed.  I will see you in 6 months, sooner if necessary.

## 2018-03-15 NOTE — Progress Notes (Signed)
   Subjective:    Patient ID: [name], female    DOB: [birth year], 79 y.o.   MRN: [id number]  HPI  [name] is here for essential hypertension, osteoarthritis, post-menopausal osteoporosis, atrial flutter, chronic constipation, lichen sclerosis of the female genitalia, and subacute cough. Please see the A&P for the status of the pt's chronic medical problems.  She is without acute complaints.  Review of Systems  Constitutional: Negative for activity change, appetite change and unexpected weight change.  Respiratory: Positive for cough. Negative for chest tightness and shortness of breath.   Cardiovascular: Negative for chest pain, palpitations and leg swelling.  Gastrointestinal: Positive for constipation. Negative for abdominal pain, blood in stool, diarrhea, nausea and vomiting.  Genitourinary: Negative for difficulty urinating and dysuria.  Musculoskeletal: Positive for arthralgias. Negative for joint swelling and myalgias.  Skin: Negative for rash.  Psychiatric/Behavioral: Positive for sleep disturbance.       Secondary to nocturnal cough      Objective:   Physical Exam Vitals signs and nursing note reviewed.  Constitutional:      General: She is not in acute distress.    Appearance: Normal appearance. She is obese. She is not ill-appearing, toxic-appearing or diaphoretic.  HENT:     Head: Normocephalic and atraumatic.  Eyes:     General: No scleral icterus.       Right eye: No discharge.        Left eye: No discharge.  Pulmonary:     Effort: Pulmonary effort is normal. No respiratory distress.  Musculoskeletal: Normal range of motion.        General: No swelling, tenderness, deformity or signs of injury.     Right lower leg: No edema.     Left lower leg: No edema.  Skin:    General: Skin is warm and dry.     Coloration: Skin is not jaundiced.     Findings: No rash.  Neurological:     Mental Status: She is alert and oriented to person, place,  and time. Mental status is at baseline.     Coordination: Coordination normal.     Gait: Gait normal.  Psychiatric:        Mood and Affect: Mood normal.        Behavior: Behavior normal.        Thought Content: Thought content normal.        Judgment: Judgment normal.       Assessment & Plan:   Please see problem oriented charting.

## 2018-03-15 NOTE — Assessment & Plan Note (Signed)
Assessment  She states that her arthritis is reasonably well controlled on the as needed Tylenol.  Plan  We will continue the as needed acetaminophen and reassess the efficacy of this therapy in managing her arthritic pain at the follow-up visit.

## 2018-03-15 NOTE — Assessment & Plan Note (Signed)
Assessment  The augmented betamethasone dipropionate 0.05% ointment daily has been effective at managing her itching related to the lichen sclerosus of the female genitalia.  Plan  We will continue the augmented betamethasone dipropionate 0.05% ointment daily for her lichen sclerosus of the female genitalia and reassess the efficacy of this therapy at the follow-up visit.

## 2018-03-15 NOTE — Assessment & Plan Note (Signed)
Assessment  She denies any palpitations and is tolerating the Eliquis well without evidence of bleeding.  Plan  We will continue the Eliquis for stroke prophylaxis and reassess at the follow-up visit.

## 2018-03-15 NOTE — Assessment & Plan Note (Signed)
Assessment  She is tolerating the alendronate 70 mg by mouth weekly well and is asking for a refill which was provided.  Plan  We will continue the alendronate at 70 mg by mouth weekly and reassess her tolerance for this medication at the follow-up visit.

## 2018-03-19 ENCOUNTER — Ambulatory Visit (INDEPENDENT_AMBULATORY_CARE_PROVIDER_SITE_OTHER): Payer: Medicare Other | Admitting: Nurse Practitioner

## 2018-03-19 ENCOUNTER — Telehealth: Payer: Self-pay

## 2018-03-19 ENCOUNTER — Encounter: Payer: Self-pay | Admitting: Nurse Practitioner

## 2018-03-19 ENCOUNTER — Other Ambulatory Visit: Payer: Self-pay

## 2018-03-19 VITALS — BP 120/80 | HR 62 | Ht 64.0 in | Wt 199.1 lb

## 2018-03-19 DIAGNOSIS — I4819 Other persistent atrial fibrillation: Secondary | ICD-10-CM

## 2018-03-19 DIAGNOSIS — Z79899 Other long term (current) drug therapy: Secondary | ICD-10-CM | POA: Diagnosis not present

## 2018-03-19 LAB — CBC
Hematocrit: 43 % (ref 34.0–46.6)
Hemoglobin: 14.6 g/dL (ref 11.1–15.9)
MCH: 29.7 pg (ref 26.6–33.0)
MCHC: 34 g/dL (ref 31.5–35.7)
MCV: 87 fL (ref 79–97)
Platelets: 228 10*3/uL (ref 150–450)
RBC: 4.92 x10E6/uL (ref 3.77–5.28)
RDW: 13.1 % (ref 11.7–15.4)
WBC: 7.5 10*3/uL (ref 3.4–10.8)

## 2018-03-19 LAB — BASIC METABOLIC PANEL
BUN/Creatinine Ratio: 14 (ref 12–28)
BUN: 11 mg/dL (ref 8–27)
CO2: 23 mmol/L (ref 20–29)
Calcium: 10 mg/dL (ref 8.7–10.3)
Chloride: 99 mmol/L (ref 96–106)
Creatinine, Ser: 0.8 mg/dL (ref 0.57–1.00)
GFR calc Af Amer: 82 mL/min/{1.73_m2} (ref >59)
GFR calc non Af Amer: 71 mL/min/{1.73_m2} (ref >59)
Glucose: 83 mg/dL (ref 65–99)
Potassium: 4 mmol/L (ref 3.5–5.2)
Sodium: 140 mmol/L (ref 134–144)

## 2018-03-19 NOTE — Telephone Encounter (Signed)
Called patient to follow-up on interest in receiving nutrition and weight loss information and counseling. Patient reports she is not interested in receiving counseling at this time and reports she has lost three pounds. Offered patient support if interested in the future.

## 2018-03-19 NOTE — Patient Instructions (Addendum)
We will be checking the following labs today - BMET & CBC   Medication Instructions:    Continue with your current medicines.    If you need a refill on your cardiac medications before your next appointment, please call your pharmacy.     Testing/Procedures To Be Arranged:  N/A  Follow-Up:   See me in 6 months    At [facility], you and your health needs are our priority.  As part of our continuing mission to provide you with exceptional heart care, we have created designated Provider Care Teams.  These Care Teams include your primary Cardiologist (physician) and Advanced Practice Providers (APPs -  Physician Assistants and Nurse Practitioners) who all work together to provide you with the care you need, when you need it.  Special Instructions:  . None  Call the [facility] office at [phone] if you have any questions, problems or concerns.       

## 2018-03-19 NOTE — Telephone Encounter (Signed)
Attempted to call to follow up on counseling on healthy eating and nutrition. Spoke with family member who requested call back later as she was not present.

## 2018-03-19 NOTE — Telephone Encounter (Signed)
**Note De-Identified Hadley Soileau Obfuscation** While at an office visit with Shawn Route, NP today the pt asked to s/w me concerning her Eliquis pt asst.  I have advised the pt that she is not ready to apply for pt asst at this time because she has insurance coverage for her medications and that we will have to wait until she goes into the "donut hole" again like we did last year.  I have advised her to contact her insurance company and I wrote down questions that she should ask as follows: 1. Ask if Eliquis is the preferred anticoagulant on her plan. And 2. Ask them for recommendations to try to lower the cost of Eliquis like using a pharmacy that they recommend.  I also wrote my name and the office number down so she can call me once she has this info. She states that she just picked up a 3 month supply of Eliquis and is ok at this time.

## 2018-03-19 NOTE — Progress Notes (Signed)
CARDIOLOGY OFFICE NOTE  Date:  03/19/2018    [name] Date of Birth: 19-Dec-1940 Medical Record #[id number]  PCP:  Doneen Poisson, MD  Cardiologist:  Tyrone Sage   Chief Complaint  Patient presents with  . Follow-up    History of Present Illness: [name] is a 78 y.o. female who presents today for a follow up visit. Former patient of Dr. Yevonne Pax. She follows with me.   She has a history of chronic atrial fib, HTN, OA, rheumatic fever with MV regurgitation and anemia. She is on Eliquis for her anticoagulation. No beta blocker due to bradycardia.   She was found to be in atrial flutter when she presented for a surgical procedure at [facility] back on 04/13/2014.She had had a previous EKG on 03/16/14 which showed that she was in normal sinus rhythm at that time. She was cleared for the surgery and went on to have her surgical procedure without incident. She was found to have cricopharyngeal muscle hypertrophy - was treated with Botox.  I have seen her back several times - she has had both knees done in the interim. She has done well. She hadstopped working.I last saw her back inSeptember - she was having significant financial issues - lots of debt, kids not able to help, etc. We were able to reach out to the H &V fund to help. She has been able to get support for her Eliquis.   Patient screened for recent travel, fever, URI symptoms and shortness of breath. Patient denies travel over the last 14 days and are currently without symptoms.    Comes in today. Here alone. She feels like she is doing well. She is very grateful for the help we provided last year. She is not sure about her Eliquis and when that program expires - looks to me as if she is on her last round of Eliquis from [facility] and does not have coverage for 2020. No chest pain. Breathing is good. Not dizzy. No falls.    Past Medical History:  Diagnosis Date  . Anemia 03/16/2014  . Atrial  flutter (HCC) 05/07/2014   With controlled ventricular rate on no AV nodal agents.  . Chronic rheumatic heart disease 05/05/2014  . Constipation    takes Dulcolax as needed  . 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 Essential hypertension    takes Amlodipine daily  . GERD (gastroesophageal reflux disease)    takes Pantoprazole daily  . History of blood transfusion    no abnormal reaction noted  . History of migraine    last one a week  . History of shingles   . Hypertensive retinopathy of both eyes 03/07/2016   Moderate  . Mitral valve regurgitation, rheumatic 05/05/2014   Moderate to severe with peak PA pressure of 56 mmHg on Echo 04/2014   . Muscle spasm of both lower legs    takes Zanaflex daily as needed  . Nocturia   . Osteoarthritis    knees and hands  . Peripheral edema    takes Lasix daily as needed   . PONV (postoperative nausea and vomiting)    with recent dilatation of throat, had N/V  . Systolic murmur    per patient report she has had rheumatic fever in childhood    Past Surgical History:  Procedure Laterality Date  . ABDOMINAL HYSTERECTOMY     Fibroids  . COLONOSCOPY W/ BIOPSIES  AND POLYPECTOMY    . CYST EXCISION Right    on hand  . ESOPHAGOSCOPY WITH DILITATION N/A 04/14/2014   Procedure: RIGID ESOPHAGOSCOPY WITH DILITATION AND BOTOX  INJECTION;  Surgeon: Osborn Coho, MD;  Location: [facility] OR;  Service: ENT;  Laterality: N/A;  . MULTIPLE TOOTH EXTRACTIONS    . TOTAL KNEE ARTHROPLASTY Right 04/06/2015  . TOTAL KNEE ARTHROPLASTY Right 04/06/2015   Procedure: RIGHT TOTAL KNEE ARTHROPLASTY;  Surgeon: Marcene Corning, MD;  Location: [facility] OR;  Service: Orthopedics;  Laterality: Right;  . TOTAL KNEE ARTHROPLASTY Left 09/14/2015   Procedure: TOTAL KNEE ARTHROPLASTY LEFT;  Surgeon: Marcene Corning, MD;  Location: [facility] OR;  Service: Orthopedics;  Laterality: Left;     Medications:  Current Meds  Medication Sig  . alendronate (FOSAMAX) 70 MG tablet Take 1 tablet (70 mg total) by mouth once a week. Take with a full glass of water on an empty stomach.  [name] amLODipine (NORVASC) 10 MG tablet Take 1 tablet (10 mg total) by mouth daily.  [name] apixaban (ELIQUIS) 5 MG TABS tablet Take 1 tablet (5 mg total) by mouth 2 (two) times daily.  [name] augmented betamethasone dipropionate (DIPROLENE-AF) 0.05 % ointment Apply topically daily.  [name] Dextromethorphan-guaiFENesin 10-100 MG/5ML liquid Take 5 mLs by mouth every 12 (twelve) hours.  . fluticasone (FLONASE) 50 MCG/ACT nasal spray SHAKE LIQUID AND USE 1 SPRAY IN EACH NOSTRIL DAILY  . hydrochlorothiazide (HYDRODIURIL) 25 MG tablet Take 1 tablet (25 mg total) by mouth daily.  . polyethylene glycol powder (GLYCOLAX/MIRALAX) powder Take 255 g by mouth daily as needed for severe constipation.  . potassium chloride SA (K-DUR,KLOR-CON) 20 MEQ tablet Take 1 tablet (20 mEq total) by mouth daily.  [name] tiZANidine (ZANAFLEX) 2 MG tablet Take 1 tablet (2 mg total) by mouth every 8 (eight) hours as needed for muscle spasms.     Allergies: Allergies  Allergen Reactions  . Ace Inhibitors Cough    Social History: The patient  reports that she has never smoked. She has never used smokeless tobacco. She reports that she does not drink alcohol or use drugs.   Family History: The patient's  family history includes Asthma in her sister; Cancer in her brother, brother, and mother; Healthy in her daughter, daughter, daughter, and son; Heart attack in her sister; Other in her brother, brother, brother, brother, brother, and sister; Stroke in her father; Unexplained death in her sister.   Review of Systems: Please see the history of present illness.   Otherwise, the review of systems is positive for none.   All other systems are reviewed and negative.   Physical Exam: VS:  BP 120/80 (BP Location: Left Arm, Patient Position: Sitting, Cuff Size: Large)   Pulse 62    Ht 5\' 4"  (1.626 m)   Wt 199 lb 1.9 oz (90.3 kg)   SpO2 99% Comment: at rest  BMI 34.18 kg/m  .  BMI Body mass index is 34.18 kg/m.  Wt Readings from Last 3 Encounters:  03/19/18 199 lb 1.9 oz (90.3 kg)  03/15/18 200 lb 6.4 oz (90.9 kg)  01/31/18 201 lb 4.8 oz (91.3 kg)    General: Pleasant. Well developed, well nourished and in no acute distress. Weight down 3 pounds since my last visit.   HEENT: Normal.  Neck: Supple, no JVD, carotid bruits, or masses noted.  Cardiac: Regular rate and rhythm. No murmurs, rubs, or gallops. No edema.  Respiratory:  Lungs are clear to auscultation bilaterally with normal work of breathing.  GI: Soft and nontender.  MS: No deformity or atrophy. Gait and ROM intact.  Skin: Warm and dry. Color is normal.  Neuro:  Strength and sensation are intact and no gross focal deficits noted.  Psych: Alert, appropriate and with normal affect.   LABORATORY DATA:  EKG:  EKG is not ordered today.  Lab Results  Component Value Date   WBC 6.6 09/17/2017   HGB 14.4 09/17/2017   HCT 41.1 09/17/2017   PLT 230 09/17/2017   GLUCOSE 90 10/26/2017   CHOL 183 09/17/2017   TRIG 83 09/17/2017   HDL 53 09/17/2017   LDLCALC 113 (H) 09/17/2017   ALT 17 09/17/2017   AST 24 09/17/2017   NA 141 10/26/2017   K 3.8 10/26/2017   CL 101 10/26/2017   CREATININE 0.84 10/26/2017   BUN 12 10/26/2017   CO2 22 10/26/2017   TSH 1.660 09/17/2017   INR 1.25 09/03/2015   HGBA1C 5.6 03/16/2014     BNP (last 3 results) No results for input(s): BNP in the last 8760 hours.  ProBNP (last 3 results) No results for input(s): PROBNP in the last 8760 hours.   Other Studies Reviewed Today:  Echo Study Conclusions 03/2017 - Left ventricle: The cavity size was normal. There was mild   concentric hypertrophy. Systolic function was normal. The   estimated ejection fraction was in the range of 60% to 65%. Wall   motion was normal; there were no regional wall motion    abnormalities. - Aortic valve: Right coronary cusp mobility was moderately   restricted. There was very mild stenosis. Valve area (VTI): 2.12   cm^2. Valve area (Vmax): 1.62 cm^2. Valve area (Vmean): 1.85   cm^2. - Mitral valve: There was mild to moderate regurgitation. - Left atrium: The atrium was moderately dilated. - Right atrium: The atrium was moderately dilated.    Assessment/Plan:  1. Persistent PAF/flutter -managed with rate control and continued anticoagulation. No problems noted.   2. Chronic anticoagulation - she is getting patient assistance. We need to check on this - ?update. No bleeding/bruising noted.    3. HTN -BP looks good. No changes made today.    4. Rheumatic heart disease with MR -echo from March of 2019 noted - mild AS and mild to moderate MR - will follow - consider repeat next year.    Current medicines are reviewed with the patient today.  The patient does not have concerns regarding medicines other than what has been noted above.  The following changes have been made:  See above.  Labs/ tests ordered today include:    Orders Placed This Encounter  Procedures  . Basic metabolic panel  . CBC     Disposition:   FU with me in 6 months.   Patient is agreeable to this plan and will call if any problems develop in the interim.   SignedNorma Fredrickson, NP  03/19/2018 10:58 AM  [facility] [address] [place], [place]  [address] Phone: [phone] Fax: [phone]

## 2018-03-21 NOTE — Telephone Encounter (Signed)
I reviewed and approve this note. Norm Parcel, RD 03/21/2018 3:26 PM.

## 2018-03-31 ENCOUNTER — Encounter: Payer: Self-pay | Admitting: *Deleted

## 2018-05-17 ENCOUNTER — Telehealth: Payer: Self-pay | Admitting: *Deleted

## 2018-05-17 ENCOUNTER — Ambulatory Visit (INDEPENDENT_AMBULATORY_CARE_PROVIDER_SITE_OTHER): Payer: Medicare Other | Admitting: Internal Medicine

## 2018-05-17 ENCOUNTER — Other Ambulatory Visit: Payer: Self-pay

## 2018-05-17 DIAGNOSIS — R51 Headache: Secondary | ICD-10-CM | POA: Diagnosis not present

## 2018-05-17 DIAGNOSIS — R519 Headache, unspecified: Secondary | ICD-10-CM

## 2018-05-17 NOTE — Assessment & Plan Note (Signed)
Patient did not check her blood pressure at home.  Her headache can be of multiple etiologies which include elevated blood pressure, reoccurrence of her previous migraine, any vision/eye problem.  -Advised her to check her blood pressure, she can ask a friend or go to a pharmacy to have it checked, if elevated she will give [place] a call back.  Per patient she is compliant with her amlodipine and HCTZ. -She can try using naproxen 500 mg, 2 over-the-counter Aleve pills which she has it at home twice daily with Tylenol in between and see if that will help with her headaches. -If her headaches persist she should come to the clinic on Monday for further evaluation. -She was advised to go to emergency room if her headache get worse or she develops any chest pain, focal weakness or shortness of breath.

## 2018-05-17 NOTE — Progress Notes (Signed)
Medicine attending: Medical history, presenting problems, physical complaints, and medications, reviewed with resident physician Dr Arnetha Courser on the day of the patient telephone consultation and I concur with her evaluation and management plan. 78 y/o hypertensive woman; remote hx of migraines; now 3 d hx supa-orbital headache, slight blurred vision, no fever or photophobia, partially relieved w tylenol. Pt advised to have BP checked & rpeort to [place]; trial of combination of Naprosyn plus tylenol; call if sxs progress; will need in person visit.

## 2018-05-17 NOTE — Telephone Encounter (Signed)
Front office had received a message from pt's daughter, Annice Pih, stating pt c/o h/a's.  I called Annice Pih who asked me to call her mother.  I called Mrs. Fohl - stated she has been having h/a's "on and off going on 3 days". Stated the h/a's are above her eyes.Stated she has a hx of migraines but they had stopped yrs ago. Taking Tylenol which has not helped. Denies dizziness. I asked about aura/seeing spots - stated not really spots but like seeing "top a chair only". Hx of HTN - she does not have a bp machine to check it. No available [facility] appts today - closing 1/2 day today. Explained tele health visit - pt agreed;  [facility] tele health appt schedule this am @ 1130 AM.

## 2018-05-17 NOTE — Progress Notes (Signed)
   This is a telephone encounter between [employer] and [place] on 05/17/2018 for headache. The visit was conducted with the patient located at home and [place] at [facility]. The patient's identity was confirmed using their DOB and current address. The patient has consented to being evaluated through a telephone encounter and understands the associated risks (an examination cannot be done and the patient may need to come in for an appointment) / benefits (allows the patient to remain at home, decreasing exposure to coronavirus). I personally spent 15 minutes on medical discussion.  CC: Continuous headache for the past 3 days.  HPI:  [name] is a 78 y.o. with past medical history as listed below was given a phone call due to her complaint of consistent headache for the past 3 days. Per patient she has an history of migraines but has not experience them for many years.  This headache seems little different than her normal migraines which she used to experience many years ago.  She was describing headaches above her eyes bilaterally but more on right, having some blurry vision but denies any nausea, vomiting or photophobia.  Denies any focal weakness.  Denies any chest pain or shortness of breath.  Denies any upper respiratory symptoms, fever or chills or sick contact. Patient was using Tylenol which decreased the intensity of her pain but did not completely relieved it.  Please see assessment and plan for her medical problem.  Past Medical History:  Diagnosis Date  . Anemia 03/16/2014  . Atrial flutter (HCC) 05/07/2014   With controlled ventricular rate on no AV nodal agents.  . Chronic rheumatic heart disease 05/05/2014  . Constipation    takes Dulcolax as needed  . 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 Essential hypertension    takes Amlodipine daily  . GERD  (gastroesophageal reflux disease)    takes Pantoprazole daily  . History of blood transfusion    no abnormal reaction noted  . History of migraine    last one a week  . History of shingles   . Hypertensive retinopathy of both eyes 03/07/2016   Moderate  . Mitral valve regurgitation, rheumatic 05/05/2014   Moderate to severe with peak PA pressure of 56 mmHg on Echo 04/2014   . Muscle spasm of both lower legs    takes Zanaflex daily as needed  . Nocturia   . Osteoarthritis    knees and hands  . Peripheral edema    takes Lasix daily as needed   . PONV (postoperative nausea and vomiting)    with recent dilatation of throat, had N/V  . Systolic murmur    per patient report she has had rheumatic fever in childhood   Review of Systems: Negative except mentioned in HPI.   Assessment & Plan:   See Encounters Tab for problem based charting.  Patient discussed with Dr. Cyndie Chime.

## 2018-09-13 NOTE — Progress Notes (Deleted)
CARDIOLOGY OFFICE NOTE  Date:  09/13/2018    Curtis SitesLizzie Harrison Stief Date of Birth: 03-01-1940 Medical Record #[id number]  PCP:  Burns SpainButcher, Elizabeth A, MD  Cardiologist:  Clearnce HastenGerhardt     No chief complaint on file.   History of Present Illness: Curtis SitesLizzie Harrison Lubitz is a 78 y.o. female who presents today for a 6 month check. Former patient of Dr. Yevonne PaxBrackbill's. She follows with me.   She has a history of chronic atrial fib, HTN, OA, rheumatic fever with MV regurgitation and anemia. She is on Eliquis for her anticoagulation. No beta blocker due to bradycardia.   She was found to be in atrial flutter when she presented for a [facility] back on [facility]13/2014.She had had a previous EKG on 03/16/14 which showed that she was in normal sinus rhythm at that time. She was cleared for the surgery and went on to have her surgical procedure without incident. She was found to have cricopharyngeal muscle hypertrophy - was treated with Botox.  I have seen her back several times - she has had both knees done in the interim. She has done well. She hadstopped working.She has had significant financial issues - lots of debt, kids not able to help, etc. We were able to reach out to the H &V fund to help and get support for her Eliquis.   I last saw her in March - she was doing well.   The patient {does/does not:200015} have symptoms concerning for COVID-19 infection (fever, chills, cough, or new shortness of breath).   Comes in today. Here with   Past Medical History:  Diagnosis Date  . Anemia 03/16/2014  . Atrial flutter (HCC) 05/07/2014   With controlled ventricular rate on no AV nodal agents.  . Chronic rheumatic heart disease 05/05/2014  . Constipation    takes Dulcolax as needed  . 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 Essential hypertension    takes Amlodipine daily  . GERD  (gastroesophageal reflux disease)    takes Pantoprazole daily  . History of blood transfusion    no abnormal reaction noted  . History of migraine    last one a week  . History of shingles   . Hypertensive retinopathy of both eyes 03/07/2016   Moderate  . Mitral valve regurgitation, rheumatic 05/05/2014   Moderate to severe with peak PA pressure of 56 mmHg on Echo 04/2014   . Muscle spasm of both lower legs    takes Zanaflex daily as needed  . Nocturia   . Osteoarthritis    knees and hands  . Peripheral edema    takes Lasix daily as needed   . PONV (postoperative nausea and vomiting)    with recent dilatation of throat, had N/V  . Systolic murmur    per patient report she has had rheumatic fever in childhood    Past Surgical History:  Procedure Laterality Date  . ABDOMINAL HYSTERECTOMY     Fibroids  . COLONOSCOPY W/ BIOPSIES AND POLYPECTOMY    . CYST EXCISION Right    on hand  . ESOPHAGOSCOPY WITH DILITATION N/A 04/14/2014   Procedure: RIGID ESOPHAGOSCOPY WITH DILITATION AND BOTOX  INJECTION;  Surgeon: Osborn Cohoavid Shoemaker, MD;  [facility];  Service: ENT;  Laterality: N/A;  . MULTIPLE TOOTH EXTRACTIONS    . TOTAL KNEE ARTHROPLASTY Right 04/06/2015  . TOTAL KNEE ARTHROPLASTY Right 04/06/2015   Procedure: RIGHT  TOTAL KNEE ARTHROPLASTY;  Surgeon: Melrose Nakayama, MD;  Location: [place];  Service: Orthopedics;  Laterality: Right;  . TOTAL KNEE ARTHROPLASTY Left 09/14/2015   Procedure: TOTAL KNEE ARTHROPLASTY LEFT;  Surgeon: Melrose Nakayama, MD;  Location: [place];  Service: Orthopedics;  Laterality: Left;     Medications: No outpatient medications have been marked as taking for the 09/18/18 encounter (Appointment) with Burtis Junes, NP.     Allergies: Allergies  Allergen Reactions  . Ace Inhibitors Cough    Social History: The patient  reports that she has never smoked. She has never used smokeless tobacco. She reports that she does not drink alcohol or use drugs.   Family  History: The patient's ***family history includes Asthma in her sister; Cancer in her brother, brother, and mother; Healthy in her daughter, daughter, daughter, and son; Heart attack in her sister; Other in her brother, brother, brother, brother, brother, and sister; Stroke in her father; Unexplained death in her sister.   Review of Systems: Please see the history of present illness.   All other systems are reviewed and negative.   Physical Exam: VS:  There were no vitals taken for this visit. [name]  BMI There is no height or weight on file to calculate BMI.  Wt Readings from Last 3 Encounters:  03/19/18 199 lb 1.9 oz (90.3 kg)  03/15/18 200 lb 6.4 oz (90.9 kg)  01/31/18 201 lb 4.8 oz (91.3 kg)    General: Pleasant. Well developed, well nourished and in no acute distress.   HEENT: Normal.  Neck: Supple, no JVD, carotid bruits, or masses noted.  Cardiac: ***Regular rate and rhythm. No murmurs, rubs, or gallops. No edema.  Respiratory:  Lungs are clear to auscultation bilaterally with normal work of breathing.  GI: Soft and nontender.  MS: No deformity or atrophy. Gait and ROM intact.  Skin: Warm and dry. Color is normal.  Neuro:  Strength and sensation are intact and no gross focal deficits noted.  Psych: Alert, appropriate and with normal affect.   LABORATORY DATA:  EKG:  EKG {ACTION; IS/IS TFT:[id number]} ordered today. This demonstrates ***.  Lab Results  Component Value Date   WBC 7.5 03/19/2018   HGB 14.6 03/19/2018   HCT 43.0 03/19/2018   PLT 228 03/19/2018   GLUCOSE 83 03/19/2018   CHOL 183 09/17/2017   TRIG 83 09/17/2017   HDL 53 09/17/2017   LDLCALC 113 (H) 09/17/2017   ALT 17 09/17/2017   AST 24 09/17/2017   NA 140 03/19/2018   K 4.0 03/19/2018   CL 99 03/19/2018   CREATININE 0.80 03/19/2018   BUN 11 03/19/2018   CO2 23 03/19/2018   TSH 1.660 09/17/2017   INR 1.25 09/03/2015   HGBA1C 5.6 03/16/2014     BNP (last 3 results) No results for input(s): BNP in  the last 8760 hours.  ProBNP (last 3 results) No results for input(s): PROBNP in the last 8760 hours.   Other Studies Reviewed Today:  Echo Study Conclusions 03/2017 - Left ventricle: The cavity size was normal. There was mild concentric hypertrophy. Systolic function was normal. The estimated ejection fraction was in the range of 60% to 65%. Wall motion was normal; there were no regional wall motion abnormalities. - Aortic valve: Right coronary cusp mobility was moderately restricted. There was very mild stenosis. Valve area (VTI): 2.12 cm^2. Valve area (Vmax): 1.62 cm^2. Valve area (Vmean): 1.85 cm^2. - Mitral valve: There was mild to moderate regurgitation. - Left atrium: The  atrium was moderately dilated. - Right atrium: The atrium was moderately dilated.    Assessment/Plan:  1. Persistent PAF/flutter -managed with rate control and continued anticoagulation. No problems noted.   2. Chronic anticoagulation -she is getting patient assistance. We need to check on this - ?update. No bleeding/bruising noted.   3. HTN -BP looks good. No changes made today.   4. Rheumatic heart disease with MR -echo from March of 2019 noted - mild AS and mild to moderate MR - will follow - consider repeat next year.   5. COVID-19 Education: The signs and symptoms of COVID-19 were discussed with the patient and how to seek care for testing (follow up with PCP or arrange E-visit).  The importance of social distancing, staying at home, hand hygiene and wearing a mask when out in public were discussed today.  Current medicines are reviewed with the patient today.  The patient does not have concerns regarding medicines other than what has been noted above.  The following changes have been made:  See above.  Labs/ tests ordered today include:   No orders of the defined types were placed in this encounter.    Disposition:   FU with *** in {gen number 4-97:026378}  {Days to years:10300}.   Patient is agreeable to this plan and will call if any problems develop in the interim.   SignedNorma Fredrickson, NP  09/13/2018 9:47 AM  [facility] [address] [place], [place]  [address] Phone: [phone] Fax: [phone]

## 2018-09-18 ENCOUNTER — Ambulatory Visit: Payer: Medicare Other | Admitting: Nurse Practitioner

## 2018-09-18 ENCOUNTER — Telehealth: Payer: Self-pay | Admitting: *Deleted

## 2018-09-18 NOTE — Telephone Encounter (Signed)
S/w pt due to missed appt today.  Pt has a vm but does not know how to retrieve them.  R/s pt for October.

## 2018-09-19 ENCOUNTER — Other Ambulatory Visit: Payer: Self-pay

## 2018-09-19 ENCOUNTER — Ambulatory Visit (INDEPENDENT_AMBULATORY_CARE_PROVIDER_SITE_OTHER): Payer: Medicare Other | Admitting: Internal Medicine

## 2018-09-19 ENCOUNTER — Encounter: Payer: Self-pay | Admitting: Internal Medicine

## 2018-09-19 ENCOUNTER — Ambulatory Visit (HOSPITAL_COMMUNITY)
Admission: RE | Admit: 2018-09-19 | Discharge: 2018-09-19 | Disposition: A | Discharge status: Home / Self Care | Payer: Medicare Other | Source: Ambulatory Visit | Attending: Internal Medicine | Admitting: Internal Medicine

## 2018-09-19 VITALS — BP 134/87 | HR 67 | Temp 98.4°F | Wt 211.7 lb

## 2018-09-19 DIAGNOSIS — L900 Lichen sclerosus et atrophicus: Secondary | ICD-10-CM

## 2018-09-19 DIAGNOSIS — Z23 Encounter for immunization: Secondary | ICD-10-CM | POA: Diagnosis not present

## 2018-09-19 DIAGNOSIS — R053 Chronic cough: Secondary | ICD-10-CM

## 2018-09-19 DIAGNOSIS — R05 Cough: Secondary | ICD-10-CM

## 2018-09-19 DIAGNOSIS — D6859 Other primary thrombophilia: Secondary | ICD-10-CM

## 2018-09-19 DIAGNOSIS — Z7983 Long term (current) use of bisphosphonates: Secondary | ICD-10-CM

## 2018-09-19 DIAGNOSIS — Z7901 Long term (current) use of anticoagulants: Secondary | ICD-10-CM

## 2018-09-19 DIAGNOSIS — I4892 Unspecified atrial flutter: Secondary | ICD-10-CM

## 2018-09-19 DIAGNOSIS — N904 Leukoplakia of vulva: Secondary | ICD-10-CM

## 2018-09-19 DIAGNOSIS — Z79899 Other long term (current) drug therapy: Secondary | ICD-10-CM

## 2018-09-19 DIAGNOSIS — M810 Age-related osteoporosis without current pathological fracture: Secondary | ICD-10-CM

## 2018-09-19 DIAGNOSIS — I10 Essential (primary) hypertension: Secondary | ICD-10-CM | POA: Diagnosis not present

## 2018-09-19 DIAGNOSIS — K5909 Other constipation: Secondary | ICD-10-CM | POA: Diagnosis not present

## 2018-09-19 DIAGNOSIS — M8949 Other hypertrophic osteoarthropathy, multiple sites: Secondary | ICD-10-CM

## 2018-09-19 DIAGNOSIS — R059 Cough, unspecified: Secondary | ICD-10-CM

## 2018-09-19 DIAGNOSIS — D6869 Other thrombophilia: Secondary | ICD-10-CM

## 2018-09-19 DIAGNOSIS — J849 Interstitial pulmonary disease, unspecified: Secondary | ICD-10-CM

## 2018-09-19 DIAGNOSIS — M159 Polyosteoarthritis, unspecified: Secondary | ICD-10-CM

## 2018-09-19 DIAGNOSIS — Z0000 Encounter for general adult medical examination without abnormal findings: Secondary | ICD-10-CM

## 2018-09-19 MED ORDER — OMEPRAZOLE 20 MG PO CPDR: 20.0000 mg | DELAYED_RELEASE_CAPSULE | Freq: Every day | ORAL | 1 refills | Status: DC

## 2018-09-19 NOTE — Patient Instructions (Signed)
1. I am getting a chest Xray today to look into your cough 2. Try the omeprazole 30 min before bedtime snack to see if helps cough. If no better after 2-3 months, stop 3. See me in 6 months

## 2018-09-20 ENCOUNTER — Other Ambulatory Visit: Payer: Self-pay | Admitting: Internal Medicine

## 2018-09-20 DIAGNOSIS — I10 Essential (primary) hypertension: Secondary | ICD-10-CM

## 2018-09-20 DIAGNOSIS — I483 Typical atrial flutter: Secondary | ICD-10-CM

## 2018-09-20 DIAGNOSIS — M810 Age-related osteoporosis without current pathological fracture: Secondary | ICD-10-CM

## 2018-09-20 DIAGNOSIS — D6869 Other thrombophilia: Secondary | ICD-10-CM | POA: Insufficient documentation

## 2018-09-20 MED ORDER — APIXABAN 5 MG PO TABS: 5.0000 mg | ORAL_TABLET | Freq: Two times a day (BID) | ORAL | 3 refills | Status: DC

## 2018-09-20 MED ORDER — HYDROCHLOROTHIAZIDE 25 MG PO TABS: 25.0000 mg | ORAL_TABLET | Freq: Every day | ORAL | 3 refills | Status: DC

## 2018-09-20 MED ORDER — AMLODIPINE BESYLATE 10 MG PO TABS: 10.0000 mg | ORAL_TABLET | Freq: Every day | ORAL | 3 refills | Status: DC

## 2018-09-20 MED ORDER — ALENDRONATE SODIUM 70 MG PO TABS: 70.0000 mg | ORAL_TABLET | ORAL | 3 refills | Status: DC

## 2018-09-20 NOTE — Assessment & Plan Note (Signed)
This problem is chronic and stable.  She appears to be in sinus rhythm today on auscultation.  She is rate controlled without a nodal blocking agent.  She is on Eliquis for anticoagulation.  PLAN:  Cont current meds

## 2018-09-20 NOTE — Assessment & Plan Note (Signed)
This problem is chronic and stable.  She uses acetaminophen 3-4 times a day and this is controlling her symptoms well.  PLAN:  Cont current meds

## 2018-09-20 NOTE — Assessment & Plan Note (Signed)
This problem is chronic and controlled.  She takes her alendronate on Mondays.  Details of her DEXA scan are listed in the overview.  PLAN:  Cont current meds Ask about calcium and vitamin D at her next appointment

## 2018-09-20 NOTE — Assessment & Plan Note (Signed)
Her required thrombophilia is secondary to a flutter.  She remains on Eliquis.

## 2018-09-20 NOTE — Progress Notes (Signed)
   Subjective:    Patient ID: [name], female    DOB: 06-Sep-1940, 78 y.o.   MRN: [id number]  HPI  [name] is here for HTN F/U. Please see the A&P for the status of the pt's chronic medical problems.  ROS : per ROS section and in problem oriented charting. All other systems are negative.  PMHx, Soc hx, and / or Fam hx : Married, her husband has had a paralysis secondary to his stroke, she is retired  Review of Systems  HENT: Negative for congestion, rhinorrhea, sinus pressure, sneezing and sore throat.   Respiratory: Positive for cough. Negative for shortness of breath.   Gastrointestinal: Positive for constipation. Negative for blood in stool.  Neurological: Negative for dizziness and light-headedness.  Psychiatric/Behavioral: Negative for sleep disturbance.       Objective:   Physical Exam Constitutional:      General: She is not in acute distress.    Appearance: Normal appearance. She is not ill-appearing, toxic-appearing or diaphoretic.  HENT:     Head: Normocephalic and atraumatic.     Right Ear: External ear normal.     Left Ear: External ear normal.  Eyes:     General: No scleral icterus.    Extraocular Movements: Extraocular movements intact.  Cardiovascular:     Rate and Rhythm: Normal rate and regular rhythm.     Heart sounds: No murmur.     Comments: +1 pitting edema pretibially bilaterally, nontender Pulmonary:     Effort: Pulmonary effort is normal. No respiratory distress.     Breath sounds: Normal breath sounds. No wheezing or rhonchi.     Comments: Decreased breath sounds in the bases bilaterally Musculoskeletal:        General: No swelling, tenderness, deformity or signs of injury.  Skin:    General: Skin is warm and dry.  Neurological:     General: No focal deficit present.     Mental Status: She is alert. Mental status is at baseline.  Psychiatric:        Mood and Affect: Mood normal.        Behavior: Behavior normal.         Thought Content: Thought content normal.        Judgment: Judgment normal.       Assessment & Plan:

## 2018-09-20 NOTE — Assessment & Plan Note (Signed)
This problem is chronic and controlled.  She remains on betamethasone ointment as needed which controls her symptoms well.  PLAN:  Cont current meds

## 2018-09-20 NOTE — Assessment & Plan Note (Signed)
This problem is chronic and uncontrolled.  She is using a pill that she gets over-the-counter which is 5 mg.  She states that this is working well enough.  She is not using any pharmaceuticals like the GoLYTELY powder that had recently been prescribed.  PLAN : follow

## 2018-09-20 NOTE — Assessment & Plan Note (Signed)
She uses Zanaflex seldomly for spasms. She does take Flonase and potassium which are on her medicine list Her iron was stopped and 2016.  Her ferritin was most recently checked in November 2016 and it was 15.  I am going to recheck her iron at the next blood draw.

## 2018-09-20 NOTE — Assessment & Plan Note (Addendum)
This problem is chronic and uncontrolled.  She has had a cough for almost a year.  It occurs mostly at night and wakes her up.  Sometimes the cough at night is quite significant but other times not quite as bad.  She occasionally gets it during the day but not as significantly.  Mucinex has not provided any relief.  She denies any heartburn symptoms.  She had been on a PPI but stopped it about a year ago as she did not feel she had any symptoms.  She denies any dyspnea.  She has never used tobacco.  Since this is a chronic cough, I am going to start with a chest x-ray.  I personally reviewed her chest x-ray images which were a 2 view PA & lateral and showed fine interstitial changes diffusely throughout both lung fields.  Radiology recommended an ILD protocol CT scan.  PLAN : I will call her and inform her about the results and the recommended CT scan Since it is mostly at night, I recommended a PPI for 2 months before bedtime to see if there is a component of GERD   Addendum Chest x-ray suggested bilateral diffuse mild ILD.  I discussed with patient and she is agreeable to get CT scan without contrast.

## 2018-09-20 NOTE — Assessment & Plan Note (Signed)
This problem is chronic and stable.  She is on amlodipine 10 mg and HCTZ 25 mg without any side effects to this medication.  Her blood pressure is at goal.  PLAN:  Cont current meds   BP Readings from Last 3 Encounters:  09/19/18 134/87  03/19/18 120/80  03/15/18 133/68

## 2018-09-24 NOTE — Addendum Note (Signed)
Addended by: Larey Dresser A on: 09/24/2018 04:15 PM   Modules accepted: Orders

## 2018-10-03 ENCOUNTER — Other Ambulatory Visit: Payer: Self-pay | Admitting: Internal Medicine

## 2018-10-03 DIAGNOSIS — K219 Gastro-esophageal reflux disease without esophagitis: Secondary | ICD-10-CM

## 2018-10-04 NOTE — Progress Notes (Signed)
CARDIOLOGY OFFICE NOTE  Date:  10/09/2018    [name] Date of Birth: [birth year] Medical Record #[id number]  PCP:  Burns Spain, MD  Cardiologist:  Tyrone Sage    Chief Complaint  Patient presents with  . Follow-up    History of Present Illness: [name] is a 78 y.o. female who presents today for a follow up visit. Former patient of Dr. Yevonne Pax. She follows with me.   She has a history of chronic atrial fib, HTN, OA, rheumatic fever with MV regurgitation and anemia. She is on Eliquis for her anticoagulation. No beta blocker due to bradycardia.   She was found to be in atrial flutter when she presented for a surgical procedure at [facility] back on 04/13/2014.She had had a previous EKG on 03/16/14 which showed that she was in normal sinus rhythm at that time. She was cleared for the surgery and went on to have her surgical procedure without incident. She was found to have cricopharyngeal muscle hypertrophy - was treated with Botox.  I have seen her back several times - she has had both knees done in the interim. She has done well. She hadstopped working.I last saw her back inSeptember - she was having significant financial issues - lots of debt, kids not able to help, etc. We were able to reach out to the [employer] &[employer] fund to help. She has been able to get support for her Eliquis. I last saw her in the office here back in March - right at the beginning of the pandemic. She was doing well.   The patient does not have symptoms concerning for COVID-19 infection (fever, chills, cough, or new shortness of breath).   Comes in today. Here with her daughter. Her daughter thought she was at this visit with me to get test results - which I have not ordered. Apparently has had a chronic cough - had a CXR worrisome for ILD - CT yesterday did not show this. There was atherosclerosis noted and probably pulmonary HTN. No mass and no ILD. She had stopped her PPI  - got back on it and her cough has improved - sounds like this was all related to reflux. No chest pain. Breathing is ok. She has gained some weight - not as active due to the pandemic. No bleeding or bruising. Does need some lab today.   Past Medical History:  Diagnosis Date  . Anemia 03/16/2014  . Atrial flutter (HCC) 05/07/2014   With controlled ventricular rate on no AV nodal agents.  . Chronic rheumatic heart disease 05/05/2014  . Constipation    takes Dulcolax as needed  . 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 Essential hypertension    takes Amlodipine daily  . GERD (gastroesophageal reflux disease)    takes Pantoprazole daily  . History of blood transfusion    no abnormal reaction noted  . History of migraine    last one a week  . History of shingles   . Hypertensive retinopathy of both eyes 03/07/2016   Moderate  . Mitral valve regurgitation, rheumatic 05/05/2014   Moderate to severe with peak PA pressure of 56 mmHg on Echo 04/2014   . Muscle spasm of both lower legs    takes Zanaflex daily as needed  . Nocturia   . Osteoarthritis    knees and hands  . Peripheral edema    takes Lasix daily as  needed   . PONV (postoperative nausea and vomiting)    with recent dilatation of throat, had N/V  . Systolic murmur    per patient report she has had rheumatic fever in childhood    Past Surgical History:  Procedure Laterality Date  . ABDOMINAL HYSTERECTOMY     Fibroids  . COLONOSCOPY W/ BIOPSIES AND POLYPECTOMY    . CYST EXCISION Right    on hand  . ESOPHAGOSCOPY WITH DILITATION N/A 04/14/2014   Procedure: RIGID ESOPHAGOSCOPY WITH DILITATION AND BOTOX  INJECTION;  Surgeon: Osborn Cohoavid Shoemaker, MD;  [facility];  Service: ENT;  Laterality: N/A;  . MULTIPLE TOOTH EXTRACTIONS    . TOTAL KNEE ARTHROPLASTY Right 04/06/2015  . TOTAL KNEE ARTHROPLASTY Right 04/06/2015   Procedure: RIGHT TOTAL  KNEE ARTHROPLASTY;  Surgeon: Marcene CorningPeter Dalldorf, MD;  Location: [facility] OR;  Service: Orthopedics;  Laterality: Right;  . TOTAL KNEE ARTHROPLASTY Left 09/14/2015   Procedure: TOTAL KNEE ARTHROPLASTY LEFT;  Surgeon: Marcene CorningPeter Dalldorf, MD;  [facility];  Service: Orthopedics;  Laterality: Left;     Medications: Current Meds  Medication Sig  . alendronate (FOSAMAX) 70 MG tablet Take 1 tablet (70 mg total) by mouth once a week. Take with a full glass of water on an empty stomach.  [name]. amLODipine (NORVASC) 10 MG tablet Take 1 tablet (10 mg total) by mouth daily.  [name]. apixaban (ELIQUIS) 5 MG TABS tablet Take 1 tablet (5 mg total) by mouth 2 (two) times daily.  [name]. augmented betamethasone dipropionate (DIPROLENE-AF) 0.05 % ointment Apply topically daily.  [name]. Dextromethorphan-guaiFENesin 10-100 MG/5ML liquid Take 5 mLs by mouth every 12 (twelve) hours.  . fluticasone (FLONASE) 50 MCG/ACT nasal spray SHAKE LIQUID AND USE 1 SPRAY IN EACH NOSTRIL DAILY  . hydrochlorothiazide (HYDRODIURIL) 25 MG tablet Take 1 tablet (25 mg total) by mouth daily.  [name]. omeprazole (PRILOSEC) 20 MG capsule Take 1 capsule (20 mg total) by mouth daily. Take on empty stomach, 30 minutes before bedtime snack  . polyethylene glycol powder (GLYCOLAX/MIRALAX) powder Take 255 g by mouth daily as needed for severe constipation.  . potassium chloride SA (K-DUR,KLOR-CON) 20 MEQ tablet Take 1 tablet (20 mEq total) by mouth daily.  [name]. tiZANidine (ZANAFLEX) 2 MG tablet Take 1 tablet (2 mg total) by mouth every 8 (eight) hours as needed for muscle spasms.     Allergies: Allergies  Allergen Reactions  . Ace Inhibitors Cough    Social History: The patient  reports that she has never smoked. She has never used smokeless tobacco. She reports that she does not drink alcohol or use drugs.   Family History: The patient's family history includes Asthma in her sister; Cancer in her brother, brother, and mother; Healthy in her daughter, daughter, daughter,  and son; Heart attack in her sister; Other in her brother, brother, brother, brother, brother, and sister; Stroke in her father; Unexplained death in her sister.   Review of Systems: Please see the history of present illness.   All other systems are reviewed and negative.   Physical Exam: VS:  BP 140/80   Pulse 68   Ht 5\' 4"  (1.626 m)   Wt 208 lb (94.3 kg)   SpO2 97%   BMI 35.70 kg/m  .  BMI Body mass index is 35.7 kg/m.  Wt Readings from Last 3 Encounters:  10/09/18 208 lb (94.3 kg)  09/19/18 211 lb 11.2 oz (96 kg)  03/19/18 199 lb 1.9 oz (90.3 kg)    General: Pleasant. Well  developed, well nourished and in no acute distress.  She has gained weight.  HEENT: Normal.  Neck: Supple, no JVD, carotid bruits, or masses noted.  Cardiac: Irregular irregular rhythm. Rate is ok. Soft murmur noted. She has no edema.  Respiratory:  Lungs are clear to auscultation bilaterally with normal work of breathing.  GI: Soft and nontender.  MS: No deformity or atrophy. Gait and ROM intact.  Skin: Warm and dry. Color is normal.  Neuro:  Strength and sensation are intact and no gross focal deficits noted.  Psych: Alert, appropriate and with normal affect.   LABORATORY DATA:  EKG:  EKG is ordered today. This demonstrates atrial fib with controlled VR.  Lab Results  Component Value Date   WBC 7.5 03/19/2018   HGB 14.6 03/19/2018   HCT 43.0 03/19/2018   PLT 228 03/19/2018   GLUCOSE 83 03/19/2018   CHOL 183 09/17/2017   TRIG 83 09/17/2017   HDL 53 09/17/2017   LDLCALC 113 (H) 09/17/2017   ALT 17 09/17/2017   AST 24 09/17/2017   NA 140 03/19/2018   K 4.0 03/19/2018   CL 99 03/19/2018   CREATININE 0.80 03/19/2018   BUN 11 03/19/2018   CO2 23 03/19/2018   TSH 1.660 09/17/2017   INR 1.25 09/03/2015   HGBA1C 5.6 03/16/2014     BNP (last 3 results) No results for input(s): BNP in the last 8760 hours.  ProBNP (last 3 results) No results for input(s): PROBNP in the last 8760 hours.    Other Studies Reviewed Today:  Echo Study Conclusions 03/2017 - Left ventricle: The cavity size was normal. There was mild concentric hypertrophy. Systolic function was normal. The estimated ejection fraction was in the range of 60% to 65%. Wall motion was normal; there were no regional wall motion abnormalities. - Aortic valve: Right coronary cusp mobility was moderately restricted. There was very mild stenosis. Valve area (VTI): 2.12 cm^2. Valve area (Vmax): 1.62 cm^2. Valve area (Vmean): 1.85 cm^2. - Mitral valve: There was mild to moderate regurgitation. - Left atrium: The atrium was moderately dilated. - Right atrium: The atrium was moderately dilated.    Assessment/Plan:  1. Persistent AF - managed with rate control and anticoagulation with Eliquis. Doing well.   2. Chronic anticoagulation - no problems noted - needs lab today. Samples of Eliquis given today.  3. Cough - probable due to reflux - this has improved with restarting PPI. CT was negative for ILD.   4. HTN - BP is fair - will follow.   5. Rheumatic heart disease with MR -echo from March of 2019 noted - mild AS and mild to moderate MR - will follow - consider updating on return. She has no symptoms at this time.   6. Atherosclerosis noted on CT - no active symptoms - would favor CV risk factor modification.   7. COVID-19 Education: The signs and symptoms of COVID-19 were discussed with the patient and how to seek care for testing (follow up with PCP or arrange E-visit).  The importance of social distancing, staying at home, hand hygiene and wearing a mask when out in public were discussed today.  Current medicines are reviewed with the patient today.  The patient does not have concerns regarding medicines other than what has been noted above.  The following changes have been made:  See above.  Labs/ tests ordered today include:    Orders Placed This Encounter  Procedures  . Basic  metabolic panel  . CBC  .  Hepatic function panel  . Lipid panel  . EKG 12-Lead     Disposition:   FU with me in 6 months.    Patient is agreeable to this plan and will call if any problems develop in the interim.   SignedTruitt Merle, NP  10/09/2018 1:58 PM  [facility] [address] [place], [place]  [address] Phone: [phone] Fax: [phone]

## 2018-10-08 ENCOUNTER — Other Ambulatory Visit: Payer: Self-pay

## 2018-10-08 ENCOUNTER — Other Ambulatory Visit: Payer: Self-pay | Admitting: Internal Medicine

## 2018-10-08 ENCOUNTER — Ambulatory Visit (HOSPITAL_COMMUNITY)
Admission: RE | Admit: 2018-10-08 | Discharge: 2018-10-08 | Disposition: A | Discharge status: Home / Self Care | Payer: Medicare Other | Source: Ambulatory Visit | Attending: Internal Medicine | Admitting: Internal Medicine

## 2018-10-08 DIAGNOSIS — J849 Interstitial pulmonary disease, unspecified: Secondary | ICD-10-CM | POA: Insufficient documentation

## 2018-10-09 ENCOUNTER — Encounter: Payer: Self-pay | Admitting: Nurse Practitioner

## 2018-10-09 ENCOUNTER — Ambulatory Visit (INDEPENDENT_AMBULATORY_CARE_PROVIDER_SITE_OTHER): Payer: Medicare Other | Admitting: Nurse Practitioner

## 2018-10-09 VITALS — BP 140/80 | HR 68 | Ht 64.0 in | Wt 208.0 lb

## 2018-10-09 DIAGNOSIS — Z79899 Other long term (current) drug therapy: Secondary | ICD-10-CM | POA: Diagnosis not present

## 2018-10-09 DIAGNOSIS — Z7901 Long term (current) use of anticoagulants: Secondary | ICD-10-CM

## 2018-10-09 DIAGNOSIS — R059 Cough, unspecified: Secondary | ICD-10-CM

## 2018-10-09 DIAGNOSIS — Z7189 Other specified counseling: Secondary | ICD-10-CM

## 2018-10-09 DIAGNOSIS — I10 Essential (primary) hypertension: Secondary | ICD-10-CM | POA: Diagnosis not present

## 2018-10-09 DIAGNOSIS — R05 Cough: Secondary | ICD-10-CM

## 2018-10-09 DIAGNOSIS — I4819 Other persistent atrial fibrillation: Secondary | ICD-10-CM

## 2018-10-09 NOTE — Patient Instructions (Addendum)
After Visit Summary:  We will be checking the following labs today - BMET, CBC, HPF and Lipids   Medication Instructions:    Continue with your current medicines.   We will see if we have some samples of Eliquis for you   If you need a refill on your cardiac medications before your next appointment, please call your pharmacy.     Testing/Procedures To Be Arranged:  N/A  Follow-Up:   See me in 6 months     At [facility], you and your health needs are our priority.  As part of our continuing mission to provide you with exceptional heart care, we have created designated Provider Care Teams.  These Care Teams include your primary Cardiologist (physician) and Advanced Practice Providers (APPs -  Physician Assistants and Nurse Practitioners) who all work together to provide you with the care you need, when you need it.  Special Instructions:  . Stay safe, stay home, wash your hands for at least 20 seconds and wear a mask when out in public.  . It was good to talk with you both today.    Call the [place] office at [phone] if you have any questions, problems or concerns.

## 2018-10-10 LAB — HEPATIC FUNCTION PANEL
ALT: 20 IU/L (ref 0–32)
AST: 27 IU/L (ref 0–40)
Albumin: 4.5 g/dL (ref 3.7–4.7)
Alkaline Phosphatase: 58 IU/L (ref 39–117)
Bilirubin Total: 0.4 mg/dL (ref 0.0–1.2)
Bilirubin, Direct: 0.13 mg/dL (ref 0.00–0.40)
Total Protein: 7.6 g/dL (ref 6.0–8.5)

## 2018-10-10 LAB — CBC
Hematocrit: 44.2 % (ref 34.0–46.6)
Hemoglobin: 15.1 g/dL (ref 11.1–15.9)
MCH: 30.4 pg (ref 26.6–33.0)
MCHC: 34.2 g/dL (ref 31.5–35.7)
MCV: 89 fL (ref 79–97)
Platelets: 213 10*3/uL (ref 150–450)
RBC: 4.96 x10E6/uL (ref 3.77–5.28)
RDW: 13.6 % (ref 11.7–15.4)
WBC: 8.1 10*3/uL (ref 3.4–10.8)

## 2018-10-10 LAB — BASIC METABOLIC PANEL
BUN/Creatinine Ratio: 14 (ref 12–28)
BUN: 11 mg/dL (ref 8–27)
CO2: 22 mmol/L (ref 20–29)
Calcium: 9.7 mg/dL (ref 8.7–10.3)
Chloride: 100 mmol/L (ref 96–106)
Creatinine, Ser: 0.8 mg/dL (ref 0.57–1.00)
GFR calc Af Amer: 82 mL/min/{1.73_m2} (ref >59)
GFR calc non Af Amer: 71 mL/min/{1.73_m2} (ref >59)
Glucose: 69 mg/dL (ref 65–99)
Potassium: 3.5 mmol/L (ref 3.5–5.2)
Sodium: 139 mmol/L (ref 134–144)

## 2018-10-10 LAB — LIPID PANEL
Chol/HDL Ratio: 3.6 ratio (ref 0.0–4.4)
Cholesterol, Total: 189 mg/dL (ref 100–199)
HDL: 53 mg/dL (ref >39)
LDL Chol Calc (NIH): 122 mg/dL — ABNORMAL HIGH (ref 0–99)
Triglycerides: 76 mg/dL (ref 0–149)
VLDL Cholesterol Cal: 14 mg/dL (ref 5–40)

## 2018-10-11 ENCOUNTER — Telehealth: Payer: Self-pay | Admitting: Internal Medicine

## 2018-10-11 DIAGNOSIS — R059 Cough, unspecified: Secondary | ICD-10-CM

## 2018-10-11 DIAGNOSIS — R05 Cough: Secondary | ICD-10-CM

## 2018-10-11 NOTE — Telephone Encounter (Signed)
I called patient to review her CT scan results.  It did not show ILD which was my concern from a chest x-ray.  It did show aortic atherosclerosis.  She is not on a statin or aspirin.  I do not think an aspirin is currently indicated as she requires Eliquis for her A. fib and I do not want to increase her bleeding risk.  We can discuss a statin at her next appointment but as she is 78 years old and has not had a vascular event, I do not know how much benefit she will get from primary prevention.  It also showed changes consistent with pulmonary artery hypertension.  She states her cough is getting better with a PPI that she takes at bedtime.  I will not work-up pulmonary artery hypertension unless her cough does not continue to improve on the PPI.

## 2018-12-03 ENCOUNTER — Other Ambulatory Visit: Payer: Self-pay | Admitting: Internal Medicine

## 2018-12-03 DIAGNOSIS — Z1231 Encounter for screening mammogram for malignant neoplasm of breast: Secondary | ICD-10-CM

## 2019-01-23 ENCOUNTER — Other Ambulatory Visit: Payer: Self-pay

## 2019-01-23 ENCOUNTER — Ambulatory Visit
Admission: RE | Admit: 2019-01-23 | Discharge: 2019-01-23 | Disposition: A | Discharge status: Home / Self Care | Payer: Medicare Other | Source: Ambulatory Visit | Attending: Internal Medicine | Admitting: Internal Medicine

## 2019-01-23 DIAGNOSIS — Z1231 Encounter for screening mammogram for malignant neoplasm of breast: Secondary | ICD-10-CM | POA: Diagnosis not present

## 2019-01-28 DIAGNOSIS — Z1152 Encounter for screening for COVID-19: Secondary | ICD-10-CM | POA: Diagnosis not present

## 2019-03-06 ENCOUNTER — Other Ambulatory Visit: Payer: Self-pay

## 2019-03-06 ENCOUNTER — Ambulatory Visit (INDEPENDENT_AMBULATORY_CARE_PROVIDER_SITE_OTHER): Payer: Medicare Other | Admitting: Internal Medicine

## 2019-03-06 ENCOUNTER — Encounter: Payer: Self-pay | Admitting: Internal Medicine

## 2019-03-06 DIAGNOSIS — D6859 Other primary thrombophilia: Secondary | ICD-10-CM

## 2019-03-06 DIAGNOSIS — K219 Gastro-esophageal reflux disease without esophagitis: Secondary | ICD-10-CM | POA: Diagnosis not present

## 2019-03-06 DIAGNOSIS — H02402 Unspecified ptosis of left eyelid: Secondary | ICD-10-CM

## 2019-03-06 DIAGNOSIS — Z9841 Cataract extraction status, right eye: Secondary | ICD-10-CM

## 2019-03-06 DIAGNOSIS — I10 Essential (primary) hypertension: Secondary | ICD-10-CM | POA: Diagnosis not present

## 2019-03-06 DIAGNOSIS — R011 Cardiac murmur, unspecified: Secondary | ICD-10-CM

## 2019-03-06 DIAGNOSIS — M818 Other osteoporosis without current pathological fracture: Secondary | ICD-10-CM

## 2019-03-06 DIAGNOSIS — I4891 Unspecified atrial fibrillation: Secondary | ICD-10-CM

## 2019-03-06 DIAGNOSIS — I4892 Unspecified atrial flutter: Secondary | ICD-10-CM

## 2019-03-06 DIAGNOSIS — Z7901 Long term (current) use of anticoagulants: Secondary | ICD-10-CM

## 2019-03-06 DIAGNOSIS — D6869 Other thrombophilia: Secondary | ICD-10-CM

## 2019-03-06 DIAGNOSIS — M810 Age-related osteoporosis without current pathological fracture: Secondary | ICD-10-CM

## 2019-03-06 DIAGNOSIS — N904 Leukoplakia of vulva: Secondary | ICD-10-CM

## 2019-03-06 DIAGNOSIS — Z79899 Other long term (current) drug therapy: Secondary | ICD-10-CM

## 2019-03-06 DIAGNOSIS — M159 Polyosteoarthritis, unspecified: Secondary | ICD-10-CM

## 2019-03-06 DIAGNOSIS — M8949 Other hypertrophic osteoarthropathy, multiple sites: Secondary | ICD-10-CM

## 2019-03-06 DIAGNOSIS — R05 Cough: Secondary | ICD-10-CM | POA: Diagnosis not present

## 2019-03-06 NOTE — Patient Instructions (Signed)
1. I will refill all of your medicines for one year 2. Continue the stomach medicine before bed if it is helping your cough 3. I hope your eye surgery goes well

## 2019-03-07 MED ORDER — BETAMETHASONE DIPROPIONATE AUG 0.05 % EX OINT: TOPICAL_OINTMENT | Freq: Every day | CUTANEOUS | 11 refills | Status: DC

## 2019-03-07 MED ORDER — POTASSIUM CHLORIDE CRYS ER 20 MEQ PO TBCR: 20.0000 meq | EXTENDED_RELEASE_TABLET | Freq: Every day | ORAL | 3 refills | Status: DC

## 2019-03-07 MED ORDER — OMEPRAZOLE 20 MG PO CPDR: 20.0000 mg | DELAYED_RELEASE_CAPSULE | Freq: Every day | ORAL | 3 refills | Status: DC

## 2019-03-07 NOTE — Assessment & Plan Note (Signed)
This problem is chronic and stable.  She is on Eliquis due to A. fib.  She has no side effects to this medication.

## 2019-03-07 NOTE — Assessment & Plan Note (Signed)
This problem is chronic and controlled.  She is on Norvasc 10 and HCTZ 25.  She has no side effects to these medications.  She also uses Lasix 40 as needed but infrequently for lower extremity edema.  Her blood pressure is not necessarily at goal but based on her age and comorbidities, I am not going to escalate to a third medication today but will continue to follow her blood pressure.  PLAN:  Cont current meds    BP Readings from Last 3 Encounters:  03/06/19 (!) 152/96  10/09/18 140/80  09/19/18 134/87

## 2019-03-07 NOTE — Assessment & Plan Note (Signed)
This problem is chronic and controlled.  She requested a refill on her topical corticosteroid which I filled.  PLAN:  Cont current meds

## 2019-03-07 NOTE — Assessment & Plan Note (Signed)
This problem is chronic and controlled.  She uses acetaminophen 3-4 times a day to control her knee OA pain.  She has no side effects to this medication.  PLAN:  Cont current meds

## 2019-03-07 NOTE — Assessment & Plan Note (Signed)
This problem is chronic and stable.  She remains on alendronate, on year 3 after having stopped it for a 2-year holiday.  She is also on calcium and vitamin D.  She has had no fractures.  PLAN:  Cont current meds

## 2019-03-07 NOTE — Progress Notes (Signed)
   Subjective:    Patient ID: [name], female    DOB: 08-21-1940, 79 y.o.   MRN: [id number]  HPI  [name] is here for F/U of cough. Please see the A&P for the status of the pt's chronic medical problems.  ROS : per ROS section and in problem oriented charting. All other systems are negative.  PMHx, Soc hx, and / or Fam hx : Married. Non smoker.  She is scheduled to get a left cataract surgery this summer.  She has had her right cataract removed previously and did not realize how much her vision has been affected.  Review of Systems  Respiratory: Positive for cough.   Occasional spasms in the legs Knee pain bilaterally Lifelong droopy left eyelid    Objective:   Physical Exam Constitutional:      Appearance: Normal appearance.  HENT:     Head: Normocephalic and atraumatic.     Right Ear: External ear normal.     Left Ear: External ear normal.  Eyes:     Extraocular Movements: Extraocular movements intact.     Conjunctiva/sclera: Conjunctivae normal.     Comments: L eyelid droopy but can completely open eye with EOMI  Cardiovascular:     Rate and Rhythm: Normal rate and regular rhythm.     Heart sounds: Murmur present.     Comments: Systolic murmur.   +1 LE edema L>R Pulmonary:     Effort: Pulmonary effort is normal.  Musculoskeletal:        General: No swelling, tenderness, deformity or signs of injury.  Skin:    General: Skin is warm and dry.  Neurological:     General: No focal deficit present.     Mental Status: She is alert. Mental status is at baseline.  Psychiatric:        Mood and Affect: Mood normal.        Behavior: Behavior normal.        Thought Content: Thought content normal.        Judgment: Judgment normal.        Assessment & Plan:

## 2019-03-07 NOTE — Assessment & Plan Note (Signed)
This problem is chronic and well controlled.  She is on Eliquis for anticoagulation and does not require any nodal agent.  I reviewed her most recent cardiology note who continued her current medications and recommended considering a repeat echo to follow-up for mild to moderate mitral regurgitation.  I discussed that it was up to her whether she continue to see cardiology or just seeing me and I can order an echo.  She had not made up her mind at the conclusion of her appointment.  PLAN:  Cont current meds

## 2019-03-07 NOTE — Assessment & Plan Note (Signed)
This problem is chronic and uncontrolled.  At her last appointment, she had complained of a chronic cough.  Her chest x-ray suggested ILD but her CT scan did not confirm any ILD changes.  I had started her on a PPI before bedtime at her last appointment.  Her cardiology appointment stated that that had resolved her cough.  Today, she states that she still coughs if she gets excited and then also sometimes she coughs at night.  On further discussion, she thinks that she forgets the PPI at bedtime on certain nights and notes those are the nights that she coughs.  She is going to try to remember to take the PPI every night and she wants to continue it as she thinks it did help her cough.  Assessment : Likely GERD induced cough  PLAN : Continue PPI nightly

## 2019-04-15 NOTE — Progress Notes (Signed)
CARDIOLOGY OFFICE NOTE  Date:  04/23/2019    [name] Date of Birth: 03/14/1940 Medical Record #[id number]  PCP:  Burns Spain, MD  Cardiologist:  Tyrone Sage    Chief Complaint  Patient presents with  . Follow-up    History of Present Illness: [name] is a 79 y.o. female who presents today for a 6 month check. Former patient of Dr. Yevonne Pax. She follows with me.   She has a history of chronic atrial fib, HTN, OA, rheumatic fever with MV regurgitation and anemia. She is on Eliquis for her anticoagulation. No beta blocker due to bradycardia.   She was found to be in atrial flutter when she presented for a surgical procedure at [facility] back on 04/13/2014.She had had a previous EKG on 03/16/14 which showed that she was in normal sinus rhythm at that time. She was cleared for the surgery and went on to have her surgical procedure without incident. She was found to have cricopharyngeal muscle hypertrophy - was treated with Botox.  I have seen her back several times - she has had both knees done in the interim. She has done well. She hadstopped working.I last saw her back inSeptember - she was having significant financial issues - lots of debt, kids not able to help, etc. We were able to reach out to the [employer] fund to help.She has been able to get support for her Eliquis.I last saw her in October - she had had some chronic cough - had gotten worked up for ILD - CT did not show this - had stopped her PPI and this was restarted and she improved - had gained weight with the pandemic but overall felt to be ok.   The patient does not have symptoms concerning for COVID-19 infection (fever, chills, cough, or new shortness of breath).   Comes in today. Here alone. She feels like she is doing ok. Has had both vaccines. Cough has improved. She is currently off PPI - she thinks this made her hair fall out - she is not sure if she will resume. Not  dizzy or lightheaded. No chest pain. Breathing is ok. No swelling. She feels like overall she is doing ok with no real concerns.   Past Medical History:  Diagnosis Date  . Anemia 03/16/2014  . Atrial flutter (HCC) 05/07/2014   With controlled ventricular rate on no AV nodal agents.  . Chronic rheumatic heart disease 05/05/2014  . Constipation    takes Dulcolax as needed  . 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 Essential hypertension    takes Amlodipine daily  . GERD (gastroesophageal reflux disease)    takes Pantoprazole daily  . History of blood transfusion    no abnormal reaction noted  . History of migraine    last one a week  . History of shingles   . Hypertensive retinopathy of both eyes 03/07/2016   Moderate  . Mitral valve regurgitation, rheumatic 05/05/2014   Moderate to severe with peak PA pressure of 56 mmHg on Echo 04/2014   . Muscle spasm of both lower legs    takes Zanaflex daily as needed  . Nocturia   . Osteoarthritis    knees and hands  . Peripheral edema    takes Lasix daily as needed   . PONV (postoperative nausea and vomiting)    with recent dilatation of throat, had N/V  .  Systolic murmur    per patient report she has had rheumatic fever in childhood    Past Surgical History:  Procedure Laterality Date  . ABDOMINAL HYSTERECTOMY     Fibroids  . COLONOSCOPY W/ BIOPSIES AND POLYPECTOMY    . CYST EXCISION Right    on hand  . ESOPHAGOSCOPY WITH DILITATION N/A 04/14/2014   Procedure: RIGID ESOPHAGOSCOPY WITH DILITATION AND BOTOX  INJECTION;  Surgeon: Osborn Coho, MD;  Location[facility] OR;  Service: ENT;  Laterality: N/A;  . MULTIPLE TOOTH EXTRACTIONS    . TOTAL KNEE ARTHROPLASTY Right 04/06/2015  . TOTAL KNEE ARTHROPLASTY Right 04/06/2015   Procedure: RIGHT TOTAL KNEE ARTHROPLASTY;  Surgeon: Marcene Corning, MD;  Location: [facility] OR;  Service: Orthopedics;  Laterality: Right;    . TOTAL KNEE ARTHROPLASTY Left 09/14/2015   Procedure: TOTAL KNEE ARTHROPLASTY LEFT;  Surgeon: Marcene Corning, MD;  Location: [facility] OR;  Service: Orthopedics;  Laterality: Left;     Medications: Current Meds  Medication Sig  . alendronate (FOSAMAX) 70 MG tablet Take 1 tablet (70 mg total) by mouth once a week. Take with a full glass of water on an empty stomach.  [name] amLODipine (NORVASC) 10 MG tablet Take 1 tablet (10 mg total) by mouth daily.  [name] apixaban (ELIQUIS) 5 MG TABS tablet Take 1 tablet (5 mg total) by mouth 2 (two) times daily.  [name] augmented betamethasone dipropionate (DIPROLENE-AF) 0.05 % ointment Apply topically daily.  . fluticasone (FLONASE) 50 MCG/ACT nasal spray SHAKE LIQUID AND USE 1 SPRAY IN EACH NOSTRIL DAILY  . hydrochlorothiazide (HYDRODIURIL) 25 MG tablet Take 1 tablet (25 mg total) by mouth daily.  . polyethylene glycol powder (GLYCOLAX/MIRALAX) powder Take 255 g by mouth daily as needed for severe constipation.  . potassium chloride SA (KLOR-CON) 20 MEQ tablet Take 1 tablet (20 mEq total) by mouth daily.  [name] tiZANidine (ZANAFLEX) 2 MG tablet Take 1 tablet (2 mg total) by mouth every 8 (eight) hours as needed for muscle spasms.     Allergies: Allergies  Allergen Reactions  . Ace Inhibitors Cough    Social History: The patient  reports that she has never smoked. She has never used smokeless tobacco. She reports that she does not drink alcohol or use drugs.   Family History: The patient's family history includes Asthma in her sister; Cancer in her brother, brother, and mother; Healthy in her daughter, daughter, daughter, and son; Heart attack in her sister; Other in her brother, brother, brother, brother, brother, and sister; Stroke in her father; Unexplained death in her sister.   Review of Systems: Please see the history of present illness.   All other systems are reviewed and negative.   Physical Exam: VS:  BP 100/62   Pulse 80   Ht 5\' 4"  (1.626 m)   Wt 206 lb  (93.4 kg)   SpO2 95%   BMI 35.36 kg/m  .  BMI Body mass index is 35.36 kg/m.  Wt Readings from Last 3 Encounters:  04/23/19 206 lb (93.4 kg)  03/06/19 210 lb 9.6 oz (95.5 kg)  10/09/18 208 lb (94.3 kg)   BP recheck by me is 120/80.  General: Pleasant. Elderly. Alert and in no acute distress. Weight is down a few pounds.   Cardiac: Irregular irregular rhythm. Soft outflow murmur. Rate is ok. No edema.  Respiratory:  Lungs are clear to auscultation bilaterally with normal work of breathing.  GI: Soft and nontender.  MS: No deformity or atrophy. Gait and ROM intact.  Skin: Warm and dry. Color is normal.  Neuro:  Strength and sensation are intact and no gross focal deficits noted.  Psych: Alert, appropriate and with normal affect.   LABORATORY DATA:  EKG:  EKG is not ordered today.    Lab Results  Component Value Date   WBC 8.1 10/09/2018   HGB 15.1 10/09/2018   HCT 44.2 10/09/2018   PLT 213 10/09/2018   GLUCOSE 69 10/09/2018   CHOL 189 10/09/2018   TRIG 76 10/09/2018   HDL 53 10/09/2018   LDLCALC 122 (H) 10/09/2018   ALT 20 10/09/2018   AST 27 10/09/2018   NA 139 10/09/2018   K 3.5 10/09/2018   CL 100 10/09/2018   CREATININE 0.80 10/09/2018   BUN 11 10/09/2018   CO2 22 10/09/2018   TSH 1.660 09/17/2017   INR 1.25 09/03/2015   HGBA1C 5.6 03/16/2014     BNP (last 3 results) No results for input(s): BNP in the last 8760 hours.  ProBNP (last 3 results) No results for input(s): PROBNP in the last 8760 hours.   Other Studies Reviewed Today:  EchoStudy Conclusions3/2019 - Left ventricle: The cavity size was normal. There was mild concentric hypertrophy. Systolic function was normal. The estimated ejection fraction was in the range of 60% to 65%. Wall motion was normal; there were no regional wall motion abnormalities. - Aortic valve: Right coronary cusp mobility was moderately restricted. There was very mild stenosis. Valve area (VTI):  2.12 cm^2. Valve area (Vmax): 1.62 cm^2. Valve area (Vmean): 1.85 cm^2. - Mitral valve: There was mild to moderate regurgitation. - Left atrium: The atrium was moderately dilated. - Right atrium: The atrium was moderately dilated.    Assessment/Plan:  1. Persistent AF - managed with rate control and anticoagulation with Eliquis - she is felt to be doing well - needs surveillance lab today. Has not required AV nodal blocking agents.   2. Chronic anticoagulation - no problems noted with her Eliquis - still on full dose - lab today.   3. Cough - seems better - she has stopped her PPI  4. HTN - BP is fine by my check - no changes made today.   5. Rheumatic heart diseaes - last echo from March of 2019 - mild AS and mild/moderate MR - will follow - no worrisome symptoms - will consider updating on return.   6. Atherosclerosis noted on prior CT - she has no worrisome symptoms - would favor medical management. Check lipids today - need to consider statin therapy if she agrees.   7. COVID-19 Education: The signs and symptoms of COVID-19 were discussed with the patient and how to seek care for testing (follow up with PCP or arrange E-visit).  The importance of social distancing, staying at home, hand hygiene and wearing a mask when out in public were discussed today.  Current medicines are reviewed with the patient today.  The patient does not have concerns regarding medicines other than what has been noted above.  The following changes have been made:  See above.  Labs/ tests ordered today include:    Orders Placed This Encounter  Procedures  . Basic metabolic panel  . CBC  . Hepatic function panel  . Lipid panel     Disposition:   FU with me in 6 months.   Patient is agreeable to this plan and will call if any problems develop in the interim.   SignedTruitt Merle, NP  04/23/2019 3:18 PM  [facility] [address] [place], [place]  [address] Phone: [phone] Fax: [phone]

## 2019-04-21 ENCOUNTER — Other Ambulatory Visit: Payer: Self-pay | Admitting: Internal Medicine

## 2019-04-21 DIAGNOSIS — R05 Cough: Secondary | ICD-10-CM

## 2019-04-21 DIAGNOSIS — R059 Cough, unspecified: Secondary | ICD-10-CM

## 2019-04-23 ENCOUNTER — Encounter: Payer: Self-pay | Admitting: Nurse Practitioner

## 2019-04-23 ENCOUNTER — Ambulatory Visit: Payer: Medicare Other | Admitting: Nurse Practitioner

## 2019-04-23 ENCOUNTER — Other Ambulatory Visit: Payer: Self-pay

## 2019-04-23 VITALS — BP 100/62 | HR 80 | Ht 64.0 in | Wt 206.0 lb

## 2019-04-23 DIAGNOSIS — I4819 Other persistent atrial fibrillation: Secondary | ICD-10-CM

## 2019-04-23 DIAGNOSIS — I051 Rheumatic mitral insufficiency: Secondary | ICD-10-CM

## 2019-04-23 DIAGNOSIS — I10 Essential (primary) hypertension: Secondary | ICD-10-CM

## 2019-04-23 DIAGNOSIS — E782 Mixed hyperlipidemia: Secondary | ICD-10-CM

## 2019-04-23 DIAGNOSIS — Z79899 Other long term (current) drug therapy: Secondary | ICD-10-CM

## 2019-04-23 NOTE — Patient Instructions (Addendum)
After Visit Summary:  We will be checking the following labs today - BMET, CBC, HPF and Lipids   Medication Instructions:    Continue with your current medicines.    If you need a refill on your cardiac medications before your next appointment, please call your pharmacy.     Testing/Procedures To Be Arranged:  N/A  Follow-Up:   See me in 6 months    At [facility], you and your health needs are our priority.  As part of our continuing mission to provide you with exceptional heart care, we have created designated Provider Care Teams.  These Care Teams include your primary Cardiologist (physician) and Advanced Practice Providers (APPs -  Physician Assistants and Nurse Practitioners) who all work together to provide you with the care you need, when you need it.  Special Instructions:  . Stay safe, stay home, wash your hands for at least 20 seconds and wear a mask when out in public.  . It was good to talk with you today.    Call the [facility] office at [phone] if you have any questions, problems or concerns.       

## 2019-04-24 LAB — BASIC METABOLIC PANEL
BUN/Creatinine Ratio: 15 (ref 12–28)
BUN: 11 mg/dL (ref 8–27)
CO2: 22 mmol/L (ref 20–29)
Calcium: 9.8 mg/dL (ref 8.7–10.3)
Chloride: 104 mmol/L (ref 96–106)
Creatinine, Ser: 0.73 mg/dL (ref 0.57–1.00)
GFR calc Af Amer: 91 mL/min/{1.73_m2} (ref >59)
GFR calc non Af Amer: 79 mL/min/{1.73_m2} (ref >59)
Glucose: 130 mg/dL — ABNORMAL HIGH (ref 65–99)
Potassium: 3.5 mmol/L (ref 3.5–5.2)
Sodium: 142 mmol/L (ref 134–144)

## 2019-04-24 LAB — HEPATIC FUNCTION PANEL
ALT: 22 IU/L (ref 0–32)
AST: 31 IU/L (ref 0–40)
Albumin: 4.4 g/dL (ref 3.7–4.7)
Alkaline Phosphatase: 57 IU/L (ref 39–117)
Bilirubin Total: 0.4 mg/dL (ref 0.0–1.2)
Bilirubin, Direct: 0.15 mg/dL (ref 0.00–0.40)
Total Protein: 7 g/dL (ref 6.0–8.5)

## 2019-04-24 LAB — LIPID PANEL
Chol/HDL Ratio: 3.6 ratio (ref 0.0–4.4)
Cholesterol, Total: 160 mg/dL (ref 100–199)
HDL: 44 mg/dL (ref >39)
LDL Chol Calc (NIH): 95 mg/dL (ref 0–99)
Triglycerides: 113 mg/dL (ref 0–149)
VLDL Cholesterol Cal: 21 mg/dL (ref 5–40)

## 2019-04-24 LAB — CBC
Hematocrit: 44.3 % (ref 34.0–46.6)
Hemoglobin: 15.1 g/dL (ref 11.1–15.9)
MCH: 30.7 pg (ref 26.6–33.0)
MCHC: 34.1 g/dL (ref 31.5–35.7)
MCV: 90 fL (ref 79–97)
Platelets: 220 10*3/uL (ref 150–450)
RBC: 4.92 x10E6/uL (ref 3.77–5.28)
RDW: 13.4 % (ref 11.7–15.4)
WBC: 6.9 10*3/uL (ref 3.4–10.8)

## 2019-05-07 ENCOUNTER — Encounter: Payer: Self-pay | Admitting: *Deleted

## 2019-05-07 NOTE — Progress Notes (Unsigned)

## 2019-05-16 ENCOUNTER — Encounter: Payer: Self-pay | Admitting: Internal Medicine

## 2019-05-16 NOTE — Progress Notes (Signed)
Things That May Be Affecting Your Health:  Alcohol  Hearing loss  Pain    Depression  Home Safety  Sexual Health   Diabetes  Lack of physical activity  Stress   Difficulty with daily activities  Loneliness  Tiredness   Drug use  Medicines  Tobacco use   Falls  Motor Vehicle Safety  Weight   Food choices  Oral Health x Other vision, care giver fatigue??   Ask about covid vaccine  YOUR PERSONALIZED HEALTH PLAN : 1. Schedule your next subsequent Medicare Wellness visit in one year 2. Attend all of your regular appointments to address your medical issues 3. Complete the preventative screenings and services   Annual Wellness Visit   Medicare Covered Preventative Screenings and Services  Services & Screenings Men and Women Who How Often Need? Date of Last Service Action  Abdominal Aortic Aneurysm Adults with AAA risk factors Once     Alcohol Misuse and Counseling All Adults Screening once a year if no alcohol misuse. Counseling up to 4 face to face sessions.     Bone Density Measurement  Adults at risk for osteoporosis Once every 2 yrs     Lipid Panel Z13.6 All adults without CV disease Once every 5 yrs     Colorectal Cancer   Stool sample or  Colonoscopy All adults 50 and older   Once every year  Every 10 years     Depression All Adults Once a year  Today   Diabetes Screening Blood glucose, post glucose load, or GTT Z13.1  All adults at risk  Pre-diabetics  Once per year  Twice per year     Diabetes  Self-Management Training All adults Diabetics 10 hrs first year; 2 hours subsequent years. Requires Copay     Glaucoma  Diabetics  Family history of glaucoma  African Americans 50 yrs +  Hispanic Americans 65 yrs + Annually - requires coppay     Hepatitis C Z72.89 or F19.20  High Risk for HCV  Born between 1945 and 1965  Annually  Once     HIV Z11.4 All adults based on risk  Annually btw ages 60 & 43 regardless of risk  Annually > 65 yrs if at increased risk      Lung Cancer Screening Asymptomatic adults aged 34-77 with 30 pack yr history and current smoker OR quit within the last 15 yrs Annually Must have counseling and shared decision making documentation before first screen     Medical Nutrition Therapy Adults with   Diabetes  Renal disease  Kidney transplant within past 3 yrs 3 hours first year; 2 hours subsequent years     Obesity and Counseling All adults Screening once a year Counseling if BMI 30 or higher  Today   Tobacco Use Counseling Adults who use tobacco  Up to 8 visits in one year     Vaccines Z23  Hepatitis B  Influenza   Pneumonia  Adults   Once  Once every flu season  Two different vaccines separated by one year   Needs Tdap if has payor source  Next Annual Wellness Visit People with Medicare Every year  Today     Services & Screenings Women Who How Often Need  Date of Last Service Action  Mammogram  Z12.31 Women over 40 One baseline ages 32-39. Annually ager 40 yrs+     Pap tests All women Annually if high risk. Every 2 yrs for normal risk women     Screening  for cervical cancer with   Pap (Z01.419 nl or Z01.411abnl) &  HPV Z11.51 Women aged 4 to 19 Once every 5 yrs     Screening pelvic and breast exams All women Annually if high risk. Every 2 yrs for normal risk women     Sexually Transmitted Diseases  Chlamydia  Gonorrhea  Syphilis All at risk adults Annually for non pregnant females at increased risk         [place] Men Who How Ofter Need  Date of Last Service Action  Prostate Cancer - DRE & PSA Men over 50 Annually.  DRE might require a copay.     Sexually Transmitted Diseases  Syphilis All at risk adults Annually for men at increased risk

## 2019-05-16 NOTE — Progress Notes (Signed)
done

## 2019-05-28 ENCOUNTER — Ambulatory Visit (INDEPENDENT_AMBULATORY_CARE_PROVIDER_SITE_OTHER): Payer: Medicare Other | Admitting: Internal Medicine

## 2019-05-28 ENCOUNTER — Other Ambulatory Visit: Payer: Self-pay

## 2019-05-28 VITALS — BP 142/64 | HR 53 | Temp 98.2°F | Ht 64.0 in | Wt 210.0 lb

## 2019-05-28 DIAGNOSIS — R6889 Other general symptoms and signs: Secondary | ICD-10-CM | POA: Diagnosis not present

## 2019-05-28 DIAGNOSIS — R5383 Other fatigue: Secondary | ICD-10-CM

## 2019-05-28 DIAGNOSIS — I10 Essential (primary) hypertension: Secondary | ICD-10-CM

## 2019-05-28 DIAGNOSIS — M79605 Pain in left leg: Secondary | ICD-10-CM

## 2019-05-28 DIAGNOSIS — I4892 Unspecified atrial flutter: Secondary | ICD-10-CM | POA: Diagnosis not present

## 2019-05-28 MED ORDER — DICLOFENAC SODIUM 1 % EX GEL: 4.0000 g | Freq: Four times a day (QID) | CUTANEOUS | 1 refills | Status: DC

## 2019-05-28 NOTE — Assessment & Plan Note (Signed)
Patient is on Eliquis for anticoagulation and not currently on any nodal agents.  She did have bradycardia at this visit.  Suspect that this may be sinus node dysfunction in setting of aging as she is not on any AV nodal agents.  She does endorse fatigue for the past 3 weeks and her bradycardia may be contributing.   Pulse Readings from Last 3 Encounters:  05/28/19 (!) 53  04/23/19 80  03/06/19 67    Plan Continue Eliquis 5 mg twice daily Continue to monitor

## 2019-05-28 NOTE — Assessment & Plan Note (Signed)
Patient endorses 3 weeks of fatigue.  She notes that she just does not have the energy to get up and complete daily tasks.  She denies any fevers, chills, headaches, dizziness/lightheadedness, palpitations, skin changes or hair fall.  She denies any focal weakness. She is bradycardic at this visit; unclear if this has been present for this duration of time. This could be contributing to her symptoms.  PHQ-9 13 at this visit.  Patient notes that she just needs to learn to "flow with the seasons". She denies any domestic abuse or any financial difficulties that may be contributing to her depression. Patient offered integrated behavioral health counseling and SSRI therapy; however, she would like to defer at this time.   Plan: TSH, CBC, Iron studies, BMP, vitamin B12 Continue to monitor

## 2019-05-28 NOTE — Assessment & Plan Note (Addendum)
Patient endorses 1 week of pain behind her left knee.  She notes that this initially started following a long walk.  She notes that during this walk, she had to stop for a while for this to improve before she could continue walking.  She notes that the pain has still persisted; however, not as bad as initially.  She has not been taking thing for the pain and does note slight improvement this morning. On examination, patient does have some tenderness to palpation at posterior aspect of left knee without radiation or sensory changes. No focal weakness observed. No significant difference in circumference of bilateral calves.  Given her risk factors, assessed ABIs in clinic for possible PVD which were wnl. She does have history of osteoarthritis with bilateral knee replacements in 2017. Unlikely that she has Bakers cyst at this time. Suspect possible musculoskeletal injury in setting of overuse.   Plan: Voltaren gel qid If persistent, will evaluate with imaging

## 2019-05-28 NOTE — Progress Notes (Signed)
CC: left leg pain, fatigue  HPI:  [name] is a 79 y.o. female with past medical history as listed below presenting for evaluation of left leg pain and fatigue.  Patient notes 1 week of left leg pain that was initially triggered with walking.  Slightly improved from prior.  She also endorses 3 weeks of fatigue.  Please see problem based charting for full assessment and plan of both concerns.  Past Medical History:  Diagnosis Date  . Anemia 03/16/2014  . Atrial flutter ([place]) 05/07/2014   With controlled ventricular rate on no AV nodal agents.  . Chronic rheumatic heart disease 05/05/2014  . Constipation    takes Dulcolax as needed  . 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 Essential hypertension    takes Amlodipine daily  . GERD (gastroesophageal reflux disease)    takes Pantoprazole daily  . History of blood transfusion    no abnormal reaction noted  . History of migraine    last one a week  . History of shingles   . Hypertensive retinopathy of both eyes 03/07/2016   Moderate  . Mitral valve regurgitation, rheumatic 05/05/2014   Moderate to severe with peak PA pressure of 56 mmHg on Echo 04/2014   . Muscle spasm of both lower legs    takes Zanaflex daily as needed  . Nocturia   . Osteoarthritis    knees and hands  . Peripheral edema    takes Lasix daily as needed   . PONV (postoperative nausea and vomiting)    with recent dilatation of throat, had N/V  . Systolic murmur    per patient report she has had rheumatic fever in childhood   Review of Systems: Negative except as stated in HPI  Physical Exam:  Vitals:   05/28/19 1007 05/28/19 1009  BP:  (!) 142/64  Pulse:  (!) 53  Temp:  98.2 F (36.8 C)  TempSrc:  Oral  SpO2:  99%  Weight: 210 lb (95.3 kg)   Height: 5\' 4"  (1.626 m)    Physical Exam Constitutional:      Appearance: Normal appearance. She is not  ill-appearing or diaphoretic.  HENT:     Mouth/Throat:     Mouth: Mucous membranes are moist.     Pharynx: Oropharynx is clear.  Cardiovascular:     Rate and Rhythm: Regular rhythm. Bradycardia present.     Pulses: Normal pulses.     Heart sounds: Murmur present. No friction rub. No gallop.   Pulmonary:     Effort: Pulmonary effort is normal. No respiratory distress.     Breath sounds: Normal breath sounds. No wheezing or rales.  Abdominal:     General: Bowel sounds are normal.     Palpations: Abdomen is soft.  Musculoskeletal:        General: Tenderness present. No swelling, deformity or signs of injury. Normal range of motion.     Comments: Mild tenderness to palpation at the posterior aspect of the left knee  Skin:    General: Skin is warm and dry.     Capillary Refill: Capillary refill takes less than 2 seconds.     Findings: No bruising, lesion or rash.  Neurological:     General: No focal deficit present.     Mental Status: She is alert and oriented to person, place, and time. Mental status is at baseline.     Sensory: No sensory deficit.  Motor: No weakness.     Gait: Gait normal.      Assessment & Plan:   See Encounters Tab for problem based charting.  Patient discussed with Dr. Rogelia Boga

## 2019-05-28 NOTE — Assessment & Plan Note (Signed)
Patient is on amlodipine 10 mg and hydrochlorothiazide 25 mg daily.  She endorses medication compliance. BP Readings from Last 3 Encounters:  05/28/19 (!) 142/64  04/23/19 100/62  03/06/19 (!) 152/96   Plan Continue current regimen

## 2019-05-28 NOTE — Patient Instructions (Addendum)
[name],  It was a pleasure seeing you in clinic. Today we discussed:   Leg pain: We performed a test to assess for any signs of vascular disease which was negative. At this time, I would like you to try voltaren gel for your leg for times per day. If you continue to have this pain, please let us know.   Fatigue: I am doing some blood work today for this. I will call you with any abnormal results.   If you have any questions or concerns, please call our clinic at [phone] between 9am-5pm and after hours call [phone] and ask for the internal medicine resident on call. If you feel you are having a medical emergency please call 911.   Thank you, we look forward to helping you remain healthy!  If you have not already done so, I recommend getting your COVID 19 vaccine.  To schedule an appointment for a COVID vaccine or be added to the vaccine wait list: Go to [email or web address]   OR Go to [email or web address]                  OR Call [phone]                                     OR Call [phone] and select Option 2

## 2019-05-29 LAB — BMP8+ANION GAP
Anion Gap: 17 mmol/L (ref 10.0–18.0)
BUN/Creatinine Ratio: 14 (ref 12–28)
BUN: 8 mg/dL (ref 8–27)
CO2: 22 mmol/L (ref 20–29)
Calcium: 9.8 mg/dL (ref 8.7–10.3)
Chloride: 102 mmol/L (ref 96–106)
Creatinine, Ser: 0.59 mg/dL (ref 0.57–1.00)
GFR calc Af Amer: 102 mL/min/{1.73_m2} (ref >59)
GFR calc non Af Amer: 88 mL/min/{1.73_m2} (ref >59)
Glucose: 88 mg/dL (ref 65–99)
Potassium: 3.4 mmol/L — ABNORMAL LOW (ref 3.5–5.2)
Sodium: 141 mmol/L (ref 134–144)

## 2019-05-29 LAB — CBC
Hematocrit: 42.4 % (ref 34.0–46.6)
Hemoglobin: 14.7 g/dL (ref 11.1–15.9)
MCH: 31.1 pg (ref 26.6–33.0)
MCHC: 34.7 g/dL (ref 31.5–35.7)
MCV: 90 fL (ref 79–97)
Platelets: 223 10*3/uL (ref 150–450)
RBC: 4.73 x10E6/uL (ref 3.77–5.28)
RDW: 12.7 % (ref 11.7–15.4)
WBC: 5.9 10*3/uL (ref 3.4–10.8)

## 2019-05-29 LAB — TSH: TSH: 1.37 u[IU]/mL (ref 0.450–4.500)

## 2019-05-29 LAB — IRON,TIBC AND FERRITIN PANEL
Ferritin: 58 ng/mL (ref 15–150)
Iron Saturation: 29 % (ref 15–55)
Iron: 107 ug/dL (ref 27–139)
Total Iron Binding Capacity: 370 ug/dL (ref 250–450)
UIBC: 263 ug/dL (ref 118–369)

## 2019-05-29 LAB — VITAMIN B12: Vitamin B-12: 958 pg/mL (ref 232–1245)

## 2019-06-03 NOTE — Progress Notes (Signed)
Internal Medicine Clinic Attending  I saw and evaluated the patient.  I personally confirmed the key portions of the history and exam documented by Dr. Aslam and I reviewed pertinent patient test results.  The assessment, diagnosis, and plan were formulated together and I agree with the documentation in the resident's note.     

## 2019-06-23 ENCOUNTER — Encounter: Payer: Self-pay | Admitting: *Deleted

## 2019-07-09 DIAGNOSIS — H35033 Hypertensive retinopathy, bilateral: Secondary | ICD-10-CM | POA: Diagnosis not present

## 2019-07-09 DIAGNOSIS — H2511 Age-related nuclear cataract, right eye: Secondary | ICD-10-CM | POA: Diagnosis not present

## 2019-07-09 DIAGNOSIS — H25011 Cortical age-related cataract, right eye: Secondary | ICD-10-CM | POA: Diagnosis not present

## 2019-07-09 DIAGNOSIS — H40013 Open angle with borderline findings, low risk, bilateral: Secondary | ICD-10-CM | POA: Diagnosis not present

## 2019-07-15 DIAGNOSIS — H25811 Combined forms of age-related cataract, right eye: Secondary | ICD-10-CM | POA: Diagnosis not present

## 2019-07-15 DIAGNOSIS — H25011 Cortical age-related cataract, right eye: Secondary | ICD-10-CM | POA: Diagnosis not present

## 2019-07-15 DIAGNOSIS — H2511 Age-related nuclear cataract, right eye: Secondary | ICD-10-CM | POA: Diagnosis not present

## 2019-09-24 ENCOUNTER — Other Ambulatory Visit: Payer: Self-pay

## 2019-09-24 DIAGNOSIS — I10 Essential (primary) hypertension: Secondary | ICD-10-CM

## 2019-09-24 MED ORDER — HYDROCHLOROTHIAZIDE 25 MG PO TABS: 25.0000 mg | ORAL_TABLET | Freq: Every day | ORAL | 3 refills | Status: DC

## 2019-09-24 MED ORDER — AMLODIPINE BESYLATE 10 MG PO TABS: 10.0000 mg | ORAL_TABLET | Freq: Every day | ORAL | 3 refills | Status: DC

## 2019-09-26 ENCOUNTER — Other Ambulatory Visit: Payer: Self-pay

## 2019-09-26 DIAGNOSIS — M810 Age-related osteoporosis without current pathological fracture: Secondary | ICD-10-CM

## 2019-09-27 MED ORDER — ALENDRONATE SODIUM 70 MG PO TABS: 70.0000 mg | ORAL_TABLET | ORAL | 3 refills | Status: DC

## 2019-10-03 ENCOUNTER — Other Ambulatory Visit: Payer: Self-pay

## 2019-10-03 DIAGNOSIS — I483 Typical atrial flutter: Secondary | ICD-10-CM

## 2019-10-03 MED ORDER — APIXABAN 5 MG PO TABS: 5.0000 mg | ORAL_TABLET | Freq: Two times a day (BID) | ORAL | 3 refills | Status: DC

## 2019-10-03 NOTE — Telephone Encounter (Signed)
Patient has an appt w/ new PCP on 10/09/19

## 2019-10-08 ENCOUNTER — Encounter: Payer: Self-pay | Admitting: Internal Medicine

## 2019-10-08 DIAGNOSIS — I700 Atherosclerosis of aorta: Secondary | ICD-10-CM

## 2019-10-08 HISTORY — DX: Atherosclerosis of aorta: I70.0

## 2019-10-08 NOTE — Assessment & Plan Note (Signed)
Mild to moderate MR on echo 2019

## 2019-10-08 NOTE — Assessment & Plan Note (Addendum)
Colonoscopies negative for polyps in 2004 and 2013.   Mammogram 01/2019 normal, f/u 1 yr. Due for pneumococcal, TDaP, Covid, influenza vaccines

## 2019-10-08 NOTE — Progress Notes (Addendum)
Established Patient Office Visit  Subjective:  Patient ID: [name], female    DOB: 03-24-1940  Age: 79 y.o. MRN: [id number]  CC:   HPI [name] presents to meet new PCP and to f/u her chronic conditions which include asymptomatic rheumatic mitral regurgitation; atrial flutter due to valvular heart disease-chronically anticoagulated; osteoporosis on bisphosphonate; HTN with hypertensive retinopathy.    What's bothering you the most about your health? "I guess I'm fine, other people have it worse".   Fatigue still a problem?  "I overwork myself".  Sleeps ok at night.  FEels rested in the mornings most of the time.   Chronic cough?    Itching of vulva, itching of inner ears.  No problems with earwax.  Uses q tips easily.  No allergy symptoms except for "spraying in my naustrils).  Coughs early in the morning, starts around 3-4 am.  Uses guiaifenisin and dextromethorphan.  No palpitations or shortness of breath.  When "doing", sometimes feels dypsnea.  No chest discomfort or tightness.  Takes MIralax on occasion "it wasn't doing what I thought it should be doing".  Takes an inexpensive medicine from the dollar store which helps.    Past Medical History:  Diagnosis Date  . Atrial flutter (HCC) 05/07/2014   With controlled ventricular rate on no AV nodal agents.  . Chronic rheumatic heart disease 05/05/2014  . Constipation    takes Dulcolax as needed  . 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 Essential hypertension    takes Amlodipine daily  . GERD (gastroesophageal reflux disease)    takes Pantoprazole daily  . History of blood transfusion    no abnormal reaction noted  . History of migraine    last one a week  . History of shingles   . Hypertensive retinopathy of both eyes 03/07/2016   Moderate  . Mitral valve regurgitation, rheumatic 05/05/2014   Moderate to  severe with peak PA pressure of 56 mmHg on Echo 04/2014   . Muscle spasm of both lower legs    takes Zanaflex daily as needed  . Osteoarthritis    knees and hands  . Peripheral edema    takes Lasix daily as needed   . PONV (postoperative nausea and vomiting)    with recent dilatation of throat, had N/V  . Systolic murmur    per patient report she has had rheumatic fever in childhood    Outpatient Medications Prior to Visit  Medication Sig Dispense Refill  . alendronate (FOSAMAX) 70 MG tablet Take 1 tablet (70 mg total) by mouth once a week. Take with a full glass of water on an empty stomach. 12 tablet 3  . amLODipine (NORVASC) 10 MG tablet Take 1 tablet (10 mg total) by mouth daily. 90 tablet 3  . apixaban (ELIQUIS) 5 MG TABS tablet Take 1 tablet (5 mg total) by mouth 2 (two) times daily. 180 tablet 3  . augmented betamethasone dipropionate (DIPROLENE-AF) 0.05 % ointment Apply topically daily. 30 g 11  . diclofenac Sodium (VOLTAREN) 1 % GEL Apply 4 g topically 4 (four) times daily. 100 g 1  . fluticasone (FLONASE) 50 MCG/ACT nasal spray SHAKE LIQUID AND USE 1 SPRAY IN EACH NOSTRIL DAILY 48 g 5  . hydrochlorothiazide (HYDRODIURIL) 25 MG tablet Take 1 tablet (25 mg total) by mouth daily. 90 tablet 3  . omeprazole (PRILOSEC) 20 MG capsule Take 1 capsule (20 mg total) by  mouth daily. Take on empty stomach, 30 minutes before bedtime snack (Patient not taking: Reported on 04/23/2019) 90 capsule 3  . polyethylene glycol powder (GLYCOLAX/MIRALAX) powder Take 255 g by mouth daily as needed for severe constipation. 255 g 1  . potassium chloride SA (KLOR-CON) 20 MEQ tablet Take 1 tablet (20 mEq total) by mouth daily. 90 tablet 3  . tiZANidine (ZANAFLEX) 2 MG tablet Take 1 tablet (2 mg total) by mouth every 8 (eight) hours as needed for muscle spasms. 30 tablet 11    Allergies  Allergen Reactions  . Ace Inhibitors Cough     Objective:    Physical Exam  There were no vitals taken for this  visit. Wt Readings from Last 3 Encounters:  05/28/19 210 lb (95.3 kg)  04/23/19 206 lb (93.4 kg)  03/06/19 210 lb 9.6 oz (95.5 kg)    Heart 3/6 systolic murmur base, 1/6 systolic murmur apex.  LUngs with bibasilar rales.  No JVD.  Legs with nonpitting ankle and foot edema.  Feet in good repair, dp pulses thready.  L 5th lateral healing shaved calus, and R 4th and 5th dorsal MTP calluses.  TMs normal without fluid level or retraction, canals clear.  Lab Results  Component Value Date   TSH 1.370 05/28/2019   Lab Results  Component Value Date   WBC 5.9 05/28/2019   HGB 14.7 05/28/2019   HCT 42.4 05/28/2019   MCV 90 05/28/2019   PLT 223 05/28/2019   Lab Results  Component Value Date   NA 141 05/28/2019   K 3.4 (L) 05/28/2019   CO2 22 05/28/2019   GLUCOSE 88 05/28/2019   BUN 8 05/28/2019   CREATININE 0.59 05/28/2019   BILITOT 0.4 04/23/2019   ALKPHOS 57 04/23/2019   AST 31 04/23/2019   ALT 22 04/23/2019   PROT 7.0 04/23/2019   ALBUMIN 4.4 04/23/2019   CALCIUM 9.8 05/28/2019   ANIONGAP 10 09/03/2015   GFR 108.80 05/05/2014   Lab Results  Component Value Date   CHOL 160 04/23/2019   Lab Results  Component Value Date   HDL 44 04/23/2019   Lab Results  Component Value Date   LDLCALC 95 04/23/2019   Lab Results  Component Value Date   TRIG 113 04/23/2019   Lab Results  Component Value Date   CHOLHDL 3.6 04/23/2019   Lab Results  Component Value Date   HGBA1C 5.6 03/16/2014      Assessment & Plan:   Problem List Items Addressed This Visit      Cardiovascular and Mediastinum   Essential hypertension - Primary (Chronic)   Mitral valve regurgitation, rheumatic (Chronic)    Mild to moderate MR on echo 2019        Musculoskeletal and Integument   Primary osteoarthritis involving multiple joints (Chronic)   Osteoporosis, post-menopausal (Chronic)     Other   Routine health maintenance    Colonoscopies negative for polyps in 2004 and 2013.            Follow-up:  67M    Miguel Aschoff, MD

## 2019-10-09 ENCOUNTER — Encounter: Payer: Self-pay | Admitting: Internal Medicine

## 2019-10-09 ENCOUNTER — Ambulatory Visit (INDEPENDENT_AMBULATORY_CARE_PROVIDER_SITE_OTHER): Payer: Medicare Other | Admitting: Internal Medicine

## 2019-10-09 VITALS — BP 151/73 | HR 52 | Temp 98.0°F | Ht 64.0 in | Wt 206.6 lb

## 2019-10-09 DIAGNOSIS — M150 Primary generalized (osteo)arthritis: Secondary | ICD-10-CM

## 2019-10-09 DIAGNOSIS — M8949 Other hypertrophic osteoarthropathy, multiple sites: Secondary | ICD-10-CM | POA: Diagnosis not present

## 2019-10-09 DIAGNOSIS — I10 Essential (primary) hypertension: Secondary | ICD-10-CM | POA: Diagnosis not present

## 2019-10-09 DIAGNOSIS — I051 Rheumatic mitral insufficiency: Secondary | ICD-10-CM

## 2019-10-09 DIAGNOSIS — Z0000 Encounter for general adult medical examination without abnormal findings: Secondary | ICD-10-CM | POA: Diagnosis not present

## 2019-10-09 DIAGNOSIS — I700 Atherosclerosis of aorta: Secondary | ICD-10-CM

## 2019-10-09 DIAGNOSIS — M159 Polyosteoarthritis, unspecified: Secondary | ICD-10-CM

## 2019-10-09 DIAGNOSIS — M810 Age-related osteoporosis without current pathological fracture: Secondary | ICD-10-CM

## 2019-10-09 DIAGNOSIS — Z23 Encounter for immunization: Secondary | ICD-10-CM | POA: Diagnosis not present

## 2019-10-09 NOTE — Patient Instructions (Signed)
[name], it was lovely to meet you today!  You received your flu shot and we are checking some blood work.  Your blood pressure is running a bit high today so we may need to increase your blood pressure medicine.  Unless you develop a new problem, I will plan to see you in about 6 months.  Take care and stay well!  Dr. Mayford Knife

## 2019-10-10 LAB — BMP8+ANION GAP
Anion Gap: 14 mmol/L (ref 10.0–18.0)
BUN/Creatinine Ratio: 15 (ref 12–28)
BUN: 10 mg/dL (ref 8–27)
CO2: 25 mmol/L (ref 20–29)
Calcium: 10.4 mg/dL — ABNORMAL HIGH (ref 8.7–10.3)
Chloride: 101 mmol/L (ref 96–106)
Creatinine, Ser: 0.67 mg/dL (ref 0.57–1.00)
GFR calc Af Amer: 97 mL/min/{1.73_m2} (ref >59)
GFR calc non Af Amer: 84 mL/min/{1.73_m2} (ref >59)
Glucose: 107 mg/dL — ABNORMAL HIGH (ref 65–99)
Potassium: 3.7 mmol/L (ref 3.5–5.2)
Sodium: 140 mmol/L (ref 134–144)

## 2019-10-20 NOTE — Progress Notes (Signed)
CARDIOLOGY OFFICE NOTE  Date:  10/28/2019    [name] Date of Birth: [birth year] Medical Record #[id number]  PCP:  Miguel Aschoff, MD  Cardiologist:  Tyrone Sage     Chief Complaint  Patient presents with  . Follow-up    History of Present Illness: [name] is a 79 y.o. female who presents today for a 6 month check. Former patient of Dr. Yevonne Pax. She follows with me.   She has a history of chronic atrial fib, HTN, OA, rheumatic fever with MV regurgitation and anemia. She is on Eliquis for her anticoagulation. No beta blocker due to bradycardia.   She was found to be in atrial flutter when she presented for a surgical procedure at [facility] back on 04/13/2014.She had had a previous EKG on 03/16/14 which showed that she was in normal sinus rhythm at that time. She was cleared for the surgery and went on to have her surgical procedure without incident. She was found to have cricopharyngeal muscle hypertrophy - was treated with Botox.  I have seen her back several times - she has had both knees done in the interim. She has done well. She hasstopped working.She had significant financial issues last year - lots of debt, kids not able to help, not able to pay her insurance premiums, etc. We were able to reach out to the H &V fund to help.She has been able to get support for her Eliquis.When seen last  October - she had had some chronic cough - had gotten worked up for ILD - CT did not show this - had stopped her PPI and this was restarted and she improved - had gained weight with the pandemic but overall felt to be ok. Last seen in April - felt to be doing ok.   Comes in today. Here alone. She is doing well. No chest pain. Not short of breath. She fatigues a little easier if she overdoes it, but otherwise, is fine as long as she paces herself. Not dizzy or lightheaded. No syncope. BP is good. Weight is stable. No bleeding with her Eliquis. She  feels like she is doing well.    Past Medical History:  Diagnosis Date  . Atrial flutter (HCC) 05/07/2014   With controlled ventricular rate on no AV nodal agents.  . Chronic rheumatic heart disease 05/05/2014  . Constipation    takes Dulcolax as needed  . 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 Essential hypertension    takes Amlodipine daily  . GERD (gastroesophageal reflux disease)    takes Pantoprazole daily  . History of blood transfusion    no abnormal reaction noted  . History of migraine    last one a week  . History of shingles   . Hypertensive retinopathy of both eyes 03/07/2016   Moderate  . Mitral valve regurgitation, rheumatic 05/05/2014   Moderate to severe with peak PA pressure of 56 mmHg on Echo 04/2014   . Muscle spasm of both lower legs    takes Zanaflex daily as needed  . Osteoarthritis    knees and hands  . Peripheral edema    takes Lasix daily as needed   . PONV (postoperative nausea and vomiting)    with recent dilatation of throat, had N/V  . Systolic murmur    per patient report she has had rheumatic fever in childhood  . Thoracic aortic atherosclerosis (HCC) 10/08/2019  Radiographic on CT chest 2020    Past Surgical History:  Procedure Laterality Date  . ABDOMINAL HYSTERECTOMY     Fibroids  . COLONOSCOPY W/ BIOPSIES AND POLYPECTOMY    . CYST EXCISION Right    on hand  . ESOPHAGOSCOPY WITH DILITATION N/A 04/14/2014   Procedure: RIGID ESOPHAGOSCOPY WITH DILITATION AND BOTOX  INJECTION;  Surgeon: Osborn Coho, MD;  Location: [facility] OR;  Service: ENT;  Laterality: N/A;  . MULTIPLE TOOTH EXTRACTIONS    . TOTAL KNEE ARTHROPLASTY Right 04/06/2015  . TOTAL KNEE ARTHROPLASTY Right 04/06/2015   Procedure: RIGHT TOTAL KNEE ARTHROPLASTY;  Surgeon: Marcene Corning, MD;  Location: [facility] OR;  Service: Orthopedics;  Laterality: Right;  . TOTAL KNEE ARTHROPLASTY Left 09/14/2015     Procedure: TOTAL KNEE ARTHROPLASTY LEFT;  Surgeon: Marcene Corning, MD;  Location: [facility] OR;  Service: Orthopedics;  Laterality: Left;     Medications: Current Meds  Medication Sig  . alendronate (FOSAMAX) 70 MG tablet Take 1 tablet (70 mg total) by mouth once a week. Take with a full glass of water on an empty stomach.  [name] amLODipine (NORVASC) 10 MG tablet Take 1 tablet (10 mg total) by mouth daily.  [name] apixaban (ELIQUIS) 5 MG TABS tablet Take 1 tablet (5 mg total) by mouth 2 (two) times daily.  [name] augmented betamethasone dipropionate (DIPROLENE-AF) 0.05 % ointment Apply topically daily.  . diclofenac Sodium (VOLTAREN) 1 % GEL Apply 4 g topically 4 (four) times daily.  . fluticasone (FLONASE) 50 MCG/ACT nasal spray SHAKE LIQUID AND USE 1 SPRAY IN EACH NOSTRIL DAILY  . hydrochlorothiazide (HYDRODIURIL) 25 MG tablet Take 1 tablet (25 mg total) by mouth daily.  [name] omeprazole (PRILOSEC) 20 MG capsule Take 1 capsule (20 mg total) by mouth daily. Take on empty stomach, 30 minutes before bedtime snack  . polyethylene glycol powder (GLYCOLAX/MIRALAX) powder Take 255 g by mouth daily as needed for severe constipation.  . potassium chloride SA (KLOR-CON) 20 MEQ tablet Take 1 tablet (20 mEq total) by mouth daily.  [name] tiZANidine (ZANAFLEX) 2 MG tablet Take 1 tablet (2 mg total) by mouth every 8 (eight) hours as needed for muscle spasms.     Allergies: Allergies  Allergen Reactions  . Ace Inhibitors Cough    Social History: The patient  reports that she has never smoked. She has never used smokeless tobacco. She reports that she does not drink alcohol and does not use drugs.   Family History: The patient's family history includes Asthma in her sister; Cancer in her brother, brother, and mother; Healthy in her daughter, daughter, daughter, and son; Heart attack in her sister; Other in her brother, brother, brother, brother, brother, and sister; Stroke in her father; Unexplained death in her sister.    Review of Systems: Please see the history of present illness.   All other systems are reviewed and negative.   Physical Exam: VS:  BP 124/62   Pulse (!) 52   Ht 5\' 4"  (1.626 m)   Wt 209 lb 9.6 oz (95.1 kg)   SpO2 96%   BMI 35.98 kg/m  .  BMI Body mass index is 35.98 kg/m.  Wt Readings from Last 3 Encounters:  10/28/19 209 lb 9.6 oz (95.1 kg)  10/09/19 206 lb 9.6 oz (93.7 kg)  05/28/19 210 lb (95.3 kg)    General: Elderly. Alert and in no acute distress.   Cardiac: Irregular irregular rhythm. Her rate is ok. No murmurs, rubs, or gallops. No edema.  Respiratory:  Lungs are clear to auscultation bilaterally with normal work of breathing.  GI: Soft and nontender.  MS: No deformity or atrophy. Gait and ROM intact.  Skin: Warm and dry. Color is normal.  Neuro:  Strength and sensation are intact and no gross focal deficits noted.  Psych: Alert, appropriate and with normal affect.   LABORATORY DATA:  EKG:  EKG is ordered today.  Personally reviewed by me. This demonstrates AF with VR of 52 - unchanged.  Lab Results  Component Value Date   WBC 5.9 05/28/2019   HGB 14.7 05/28/2019   HCT 42.4 05/28/2019   PLT 223 05/28/2019   GLUCOSE 107 (H) 10/09/2019   CHOL 160 04/23/2019   TRIG 113 04/23/2019   HDL 44 04/23/2019   LDLCALC 95 04/23/2019   ALT 22 04/23/2019   AST 31 04/23/2019   NA 140 10/09/2019   K 3.7 10/09/2019   CL 101 10/09/2019   CREATININE 0.67 10/09/2019   BUN 10 10/09/2019   CO2 25 10/09/2019   TSH 1.370 05/28/2019   INR 1.25 09/03/2015   HGBA1C 5.6 03/16/2014     BNP (last 3 results) No results for input(s): BNP in the last 8760 hours.  ProBNP (last 3 results) No results for input(s): PROBNP in the last 8760 hours.   Other Studies Reviewed Today:  EchoStudy Conclusions3/2019 - Left ventricle: The cavity size was normal. There was mild concentric hypertrophy. Systolic function was normal. The estimated ejection fraction was in the range  of 60% to 65%. Wall motion was normal; there were no regional wall motion abnormalities. - Aortic valve: Right coronary cusp mobility was moderately restricted. There was very mild stenosis. Valve area (VTI): 2.12 cm^2. Valve area (Vmax): 1.62 cm^2. Valve area (Vmean): 1.85 cm^2. - Mitral valve: There was mild to moderate regurgitation. - Left atrium: The atrium was moderately dilated. - Right atrium: The atrium was moderately dilated.    Assessment/Plan:  1. Persistent AF - managed with rate control and continued anticoagulation with Eliquis - doing well. On no AV nodal blocking agents.   2. Resting bradycardia - not symptomatic - would follow.   3. Chronic anticoagulation - needs CBC today. No bleeding or other problems noted.   4. HTN - BP looks good on her current regimen - no changes made today.   5. Rheumatic heart disease - has had last echo in March of 2019 - has mild AS and mild/moderate MR - no real murmur appreciated on exam. She is doing well. Would favor updating on return visit in 2022.   6. Atherosclerosis - noted from prior CT - no chest pain.   Current medicines are reviewed with the patient today.  The patient does not have concerns regarding medicines other than what has been noted above.  The following changes have been made:  See above.  Labs/ tests ordered today include:    Orders Placed This Encounter  Procedures  . CBC  . EKG 12-Lead     Disposition:   FU with Dr. Shari Prows in 6 months. She is aware that I am leaving in February.    Patient is agreeable to this plan and will call if any problems develop in the interim.   SignedNorma Fredrickson, NP  10/28/2019 3:05 PM  [facility] [address] [place], [place]  [address] Phone: [phone] Fax: [phone]

## 2019-10-27 ENCOUNTER — Ambulatory Visit: Payer: Medicare Other | Admitting: Nurse Practitioner

## 2019-10-28 ENCOUNTER — Encounter: Payer: Self-pay | Admitting: Nurse Practitioner

## 2019-10-28 ENCOUNTER — Other Ambulatory Visit: Payer: Self-pay

## 2019-10-28 ENCOUNTER — Ambulatory Visit: Payer: Medicare Other | Admitting: Nurse Practitioner

## 2019-10-28 VITALS — BP 124/62 | HR 52 | Ht 64.0 in | Wt 209.6 lb

## 2019-10-28 DIAGNOSIS — I051 Rheumatic mitral insufficiency: Secondary | ICD-10-CM | POA: Diagnosis not present

## 2019-10-28 DIAGNOSIS — Z79899 Other long term (current) drug therapy: Secondary | ICD-10-CM | POA: Diagnosis not present

## 2019-10-28 DIAGNOSIS — I4819 Other persistent atrial fibrillation: Secondary | ICD-10-CM

## 2019-10-28 DIAGNOSIS — I10 Essential (primary) hypertension: Secondary | ICD-10-CM

## 2019-10-28 NOTE — Patient Instructions (Addendum)
After Visit Summary:  We will be checking the following labs today - CBC   Medication Instructions:    Continue with your current medicines.    If you need a refill on your cardiac medications before your next appointment, please call your pharmacy.     Testing/Procedures To Be Arranged:  N/A  Follow-Up:   See Dr. Shari Prows in 6 months.     At [facility], you and your health needs are our priority.  As part of our continuing mission to provide you with exceptional heart care, we have created designated Provider Care Teams.  These Care Teams include your primary Cardiologist (physician) and Advanced Practice Providers (APPs -  Physician Assistants and Nurse Practitioners) who all work together to provide you with the care you need, when you need it.  Special Instructions:  . Stay safe, wash your hands for at least 20 seconds and wear a mask when needed.  . It was good to talk with you today.    Call the [facility] office at [phone] if you have any questions, problems or concerns.

## 2019-10-29 ENCOUNTER — Telehealth: Payer: Self-pay | Admitting: *Deleted

## 2019-10-29 LAB — CBC
Hematocrit: 42.6 % (ref 34.0–46.6)
Hemoglobin: 14.7 g/dL (ref 11.1–15.9)
MCH: 30.9 pg (ref 26.6–33.0)
MCHC: 34.5 g/dL (ref 31.5–35.7)
MCV: 90 fL (ref 79–97)
Platelets: 194 10*3/uL (ref 150–450)
RBC: 4.75 x10E6/uL (ref 3.77–5.28)
RDW: 13.1 % (ref 11.7–15.4)
WBC: 7.3 10*3/uL (ref 3.4–10.8)

## 2019-10-29 NOTE — Telephone Encounter (Addendum)
Patient called in stating she has a neighbor down the street who just d/c from hospital for Covid. Neighbor's daughter has called our patient requesting she help take care of neighbor. Patient states she is not comfortable with this and is asking our opinion. Explained to patient that even though she has been vaccinated for Covid, with her underlying health conditions she is at high risk for severe illness if she were to contract covid. She is advised to contact neighbor's daughter and let her know on advice from PCP office she should not be in contact with someone with known covid. Patient is very Adult nurse. Kinnie Feil, BSN, RN-BC

## 2019-10-30 NOTE — Telephone Encounter (Signed)
Absolutely agree with you!

## 2019-10-31 ENCOUNTER — Telehealth: Payer: Self-pay

## 2019-10-31 NOTE — Telephone Encounter (Signed)
Received TC from patient who states she fell this morning in her home.  Patient states when she fell, she hit her right upper side, around her right breast.  She states her right breast feels achy, sore and a little crampy.  She states she has full ROM with her right arm and denies any SOB and is able to take a deep breath without pain or any difficulty.  She denies any redness or bruising to the area.  Patient states she takes OTC tylenol for general pain as needed and RN suggested taking tylenol.  Patient cautioned if she develops any SOB or difficulty breathing, it may indicate a fractured rib and to call back or go to ED for XRays.  Patient also currently on Eliquis, she denies hitting her head or having any abrasions.  Asked patient to call back on Monday and let us know how she is feeling.  Will forward to PCP for any further recommendations. Thank you, SChaplin, RN,BSN

## 2019-10-31 NOTE — Telephone Encounter (Signed)
I'm sorry to hear this and very relieved she's not seriously injured.  Hematoma at site of contusion remains possible in setting of her Eliquis.  Good advice shared.  Discussed with RN Chaplin.

## 2019-11-03 ENCOUNTER — Other Ambulatory Visit: Payer: Self-pay

## 2019-11-03 ENCOUNTER — Encounter: Payer: Self-pay | Admitting: Student

## 2019-11-03 ENCOUNTER — Ambulatory Visit (HOSPITAL_COMMUNITY)
Admission: RE | Admit: 2019-11-03 | Discharge: 2019-11-03 | Disposition: A | Discharge status: Home / Self Care | Payer: Medicare Other | Source: Ambulatory Visit | Attending: Internal Medicine | Admitting: Internal Medicine

## 2019-11-03 ENCOUNTER — Ambulatory Visit (INDEPENDENT_AMBULATORY_CARE_PROVIDER_SITE_OTHER): Payer: Medicare Other | Admitting: Student

## 2019-11-03 VITALS — BP 140/83 | HR 59 | Temp 98.4°F | Wt 205.9 lb

## 2019-11-03 DIAGNOSIS — W19XXXA Unspecified fall, initial encounter: Secondary | ICD-10-CM | POA: Diagnosis not present

## 2019-11-03 DIAGNOSIS — S2231XA Fracture of one rib, right side, initial encounter for closed fracture: Secondary | ICD-10-CM | POA: Diagnosis not present

## 2019-11-03 DIAGNOSIS — I10 Essential (primary) hypertension: Secondary | ICD-10-CM

## 2019-11-03 DIAGNOSIS — R0781 Pleurodynia: Secondary | ICD-10-CM | POA: Diagnosis not present

## 2019-11-03 NOTE — Progress Notes (Signed)
CC: Right Sided Chest Wall Pain  HPI:  [name] is a 79 y.o. female with a past medical history stated below and presents today for right sided chest pain after a fall. Please see problem based assessment and plan for additional details.  Past Medical History:  Diagnosis Date  . Atrial flutter (HCC) 05/07/2014   With controlled ventricular rate on no AV nodal agents.  . Chronic rheumatic heart disease 05/05/2014  . Constipation    takes Dulcolax as needed  . 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 Essential hypertension    takes Amlodipine daily  . GERD (gastroesophageal reflux disease)    takes Pantoprazole daily  . History of blood transfusion    no abnormal reaction noted  . History of migraine    last one a week  . History of shingles   . Hypertensive retinopathy of both eyes 03/07/2016   Moderate  . Mitral valve regurgitation, rheumatic 05/05/2014   Moderate to severe with peak PA pressure of 56 mmHg on Echo 04/2014   . Muscle spasm of both lower legs    takes Zanaflex daily as needed  . Osteoarthritis    knees and hands  . Peripheral edema    takes Lasix daily as needed   . PONV (postoperative nausea and vomiting)    with recent dilatation of throat, had N/V  . Systolic murmur    per patient report she has had rheumatic fever in childhood  . Thoracic aortic atherosclerosis (HCC) 10/08/2019   Radiographic on CT chest 2020    Current Outpatient Medications on File Prior to Visit  Medication Sig Dispense Refill  . alendronate (FOSAMAX) 70 MG tablet Take 1 tablet (70 mg total) by mouth once a week. Take with a full glass of water on an empty stomach. 12 tablet 3  . amLODipine (NORVASC) 10 MG tablet Take 1 tablet (10 mg total) by mouth daily. 90 tablet 3  . apixaban (ELIQUIS) 5 MG TABS tablet Take 1 tablet (5 mg total) by mouth 2 (two) times daily. 180 tablet 3    . augmented betamethasone dipropionate (DIPROLENE-AF) 0.05 % ointment Apply topically daily. 30 g 11  . diclofenac Sodium (VOLTAREN) 1 % GEL Apply 4 g topically 4 (four) times daily. 100 g 1  . fluticasone (FLONASE) 50 MCG/ACT nasal spray SHAKE LIQUID AND USE 1 SPRAY IN EACH NOSTRIL DAILY 48 g 5  . hydrochlorothiazide (HYDRODIURIL) 25 MG tablet Take 1 tablet (25 mg total) by mouth daily. 90 tablet 3  . omeprazole (PRILOSEC) 20 MG capsule Take 1 capsule (20 mg total) by mouth daily. Take on empty stomach, 30 minutes before bedtime snack 90 capsule 3  . polyethylene glycol powder (GLYCOLAX/MIRALAX) powder Take 255 g by mouth daily as needed for severe constipation. 255 g 1  . potassium chloride SA (KLOR-CON) 20 MEQ tablet Take 1 tablet (20 mEq total) by mouth daily. 90 tablet 3  . tiZANidine (ZANAFLEX) 2 MG tablet Take 1 tablet (2 mg total) by mouth every 8 (eight) hours as needed for muscle spasms. 30 tablet 11   No current facility-administered medications on file prior to visit.    Family History  Problem Relation Age of Onset  . Cancer Mother        Unknown type  . Stroke Father   . Unexplained death Sister   . Heart attack Sister   . Other Brother  Unknown health  . Healthy Daughter   . Healthy Son   . Asthma Sister   . Other Sister        Unknown health  . Other Brother        Unknown health  . Other Brother        Unknown health  . Cancer Brother        Unknown type  . Cancer Brother        Unknown type  . Other Brother        Accident  . Other Brother        Garment/textile technologist  . Healthy Daughter   . Healthy Daughter     Social History   Socioeconomic History  . Marital status: Married    Spouse name: Not on file  . Number of children: Not on file  . Years of education: Not on file  . Highest education level: Not on file  Occupational History  . Not on file  Tobacco Use  . Smoking status: Never Smoker  . Smokeless tobacco: Never Used  Vaping Use   . Vaping Use: Never used  Substance and Sexual Activity  . Alcohol use: No  . Drug use: No  . Sexual activity: Not Currently  Other Topics Concern  . Not on file  Social History Narrative   Current Social History 01/17/2018        Patient lives with spouse in a home which is 1 story. There are not steps up to the entrance the patient uses.       Patient's method of transportation is personal car.      The highest level of education was some high school (9th grade).      The patient currently retired.      Identified important Relationships are Daughter, Adela Lank.       Pets : none       Interests / Fun: Church and window shopping.       Current Stressors: "Disobedient, hard-headed husband" and his health issues.       Religious / Personal Beliefs: "We should love one another; be a blessing and a light for people".       Social Determinants of Health   Financial Resource Strain:   . Difficulty of Paying Living Expenses: Not on file  Food Insecurity:   . Worried About Programme researcher, broadcasting/film/video in the Last Year: Not on file  . Ran Out of Food in the Last Year: Not on file  Transportation Needs:   . Lack of Transportation (Medical): Not on file  . Lack of Transportation (Non-Medical): Not on file  Physical Activity:   . Days of Exercise per Week: Not on file  . Minutes of Exercise per Session: Not on file  Stress:   . Feeling of Stress : Not on file  Social Connections:   . Frequency of Communication with Friends and Family: Not on file  . Frequency of Social Gatherings with Friends and Family: Not on file  . Attends Religious Services: Not on file  . Active Member of Clubs or Organizations: Not on file  . Attends Banker Meetings: Not on file  . Marital Status: Not on file  Intimate Partner Violence:   . Fear of Current or Ex-Partner: Not on file  . Emotionally Abused: Not on file  . Physically Abused: Not on file  . Sexually Abused: Not on file     Review of Systems: ROS negative  except for what is noted on the assessment and plan.  Vitals:   11/03/19 0945 11/03/19 1035  BP: (!) 162/103 140/83  Pulse: 81 (!) 59  Temp: 98.4 F (36.9 C)   TempSrc: Oral   SpO2: 98%   Weight: 205 lb 14.4 oz (93.4 kg)    Physical Exam: Physical Exam Vitals and nursing note reviewed.  Constitutional:      Appearance: She is well-developed. She is not ill-appearing, toxic-appearing or diaphoretic.     Comments: Distress when right chest wall palpated  Cardiovascular:     Rate and Rhythm: Normal rate and regular rhythm.     Heart sounds: Murmur heard.   Pulmonary:     Effort: Pulmonary effort is normal. No respiratory distress.     Breath sounds: Normal breath sounds. No stridor. No wheezing, rhonchi or rales.  Musculoskeletal:     Cervical back: Normal range of motion.     Right lower leg: No edema.     Left lower leg: No edema.     Comments: Patient with severe chest wall tenderness superior to right breast and mild tenderness underneath right breast. When hands firmly placed anteriorly and posteriorly to chest wall, patient did note tenderness when breathing in.   Full ROM right upper extremity. No point tenderness. With Flexion of right shoulder, patient did have pain in her right chest wall.   Dr. Mayford Knife present for examination.    Skin:    General: Skin is warm and dry.  Neurological:     General: No focal deficit present.     Mental Status: She is alert and oriented to person, place, and time. Mental status is at baseline.  Psychiatric:        Mood and Affect: Mood normal.        Behavior: Behavior normal.        Thought Content: Thought content normal.        Judgment: Judgment normal.     Assessment & Plan:   See Encounters Tab for problem based charting.  Patient discussed and seen with Dr. Mackey Birchwood, D.O. [facility], PGY-1 Pager: [phone], Phone: [phone] Date  11/03/2019 Time 10:41 AM

## 2019-11-03 NOTE — Assessment & Plan Note (Addendum)
Assessment Patient with mechanical fall, landing on her right chest wall. Denied LOC or hitting her head, patient on eliquis. She was able to crawl to her bed and lift herself up. She was on the ground for 5-10 minutes total. She ambulates without assistance and performs ADL's for herself and assists her husband with his. Endorses pain of right chest wall. Pain is worse with movement, palpation, and coughing. She denies pain in the right breast, right arm, or wrist. Denies pain anywhere else. Tylenol has helped improve the pain. Minimal pain when taking a deep breath. Has not tried heat/ice packs. Tender over right upper chest wall. No breast tenderness. Full ROM of right upper extremity without tenderness. States she has family members who can help with ADL's. Encouraged patient to call clinic if needs more assistance or pain continues. She voices understanding.   Plan -Xray of right ribs ordered -Tylenol, ice/heat for pain -Taught patient to place pillow under arm and hug it when coughing spells occur -Instructed patient to take deep breaths daily, no incentive spirometer needed at this time -Return precautions given -Follow up clinic visit in a week, discussed with patient if she improves and does not feel as though she needs a follow up, to cancel her appointment

## 2019-11-03 NOTE — Telephone Encounter (Signed)
TC received from patient, c/o soreness to right breast/chest area from fall sustained on Friday.  States it hurts to cough and take a deep breath.  She denies any SOB, denies any bruising or redness/hematoma formation at site.  She is requesting to be seen in office, Dr. Mayford Knife attending and notified, who agreed to bring patient in clinic for evaluation. Appt made with Dr. Ladora Daniel today at 9:30/yellow team. Penne Lash, RN,BSN

## 2019-11-03 NOTE — Assessment & Plan Note (Signed)
Assessment Patient hypertensive during visit today. Suspect this is secondary to her right sided rib pain from her fall.   Plan Continue current regimen of amlodipine 10 mg and hctz 25 mg daily

## 2019-11-03 NOTE — Progress Notes (Signed)
Internal Medicine Clinic Attending  I saw and evaluated the patient.  I personally confirmed the key portions of the history and exam documented by Dr. Katsadouros and I reviewed pertinent patient test results.  The assessment, diagnosis, and plan were formulated together and I agree with the documentation in the resident's note.  

## 2019-11-03 NOTE — Patient Instructions (Addendum)
Thank you, Ms.Chanise Cherrise Occhipinti for allowing [place] to provide your care today. Today we discussed your right sided chest wall pain after falling. We will be performing an x-ray of the area and will call you with the results. Please continue to take tylenol for pain  (650 mg every 4-6 hours), and apply ice or heat for pain. If you are having coughing spells, please place a pillow under your armpit as I displayed during your visit.   Also please ask your family members if they are able to help you for the next week or so with meals and daily tasks around the house. If you feel as though you need more assistance, please call our office and we will discuss if getting a home aide is possible.   If you being having any pain in your breast or shortness of breath, please call 911.   I have ordered the following labs for you: None  Tests ordered today: Chest X-ray  Referrals ordered today: None  I have ordered the following medication/changed the following medications:   Stop the following medications: There are no discontinued medications.   Start the following medications: No orders of the defined types were placed in this encounter.    Follow up: 1-2 weeks. If you are feeling better and feel as though you no longer need the appointment, please cancel    Remember: If you are in continued pain and need more help around the house, please call the clinic! Please remember to take deep breaths throughout the day!   Should you have any questions or concerns please call the internal medicine clinic at [phone].     Thalia Bloodgood, D.O. [facility]

## 2019-11-06 ENCOUNTER — Emergency Department (HOSPITAL_COMMUNITY)
Admission: EM | Admit: 2019-11-06 | Discharge: 2019-11-06 | Disposition: A | Discharge status: Home / Self Care | Payer: Medicare Other | Attending: Emergency Medicine | Admitting: Emergency Medicine

## 2019-11-06 ENCOUNTER — Other Ambulatory Visit: Payer: Self-pay

## 2019-11-06 ENCOUNTER — Telehealth: Payer: Self-pay | Admitting: *Deleted

## 2019-11-06 ENCOUNTER — Emergency Department (HOSPITAL_COMMUNITY): Payer: Medicare Other

## 2019-11-06 DIAGNOSIS — I517 Cardiomegaly: Secondary | ICD-10-CM | POA: Diagnosis not present

## 2019-11-06 DIAGNOSIS — I10 Essential (primary) hypertension: Secondary | ICD-10-CM | POA: Insufficient documentation

## 2019-11-06 DIAGNOSIS — R059 Cough, unspecified: Secondary | ICD-10-CM | POA: Diagnosis not present

## 2019-11-06 DIAGNOSIS — H01004 Unspecified blepharitis left upper eyelid: Secondary | ICD-10-CM | POA: Insufficient documentation

## 2019-11-06 DIAGNOSIS — Z79899 Other long term (current) drug therapy: Secondary | ICD-10-CM | POA: Diagnosis not present

## 2019-11-06 DIAGNOSIS — U071 COVID-19: Secondary | ICD-10-CM | POA: Diagnosis not present

## 2019-11-06 DIAGNOSIS — J9811 Atelectasis: Secondary | ICD-10-CM | POA: Diagnosis not present

## 2019-11-06 DIAGNOSIS — Z96653 Presence of artificial knee joint, bilateral: Secondary | ICD-10-CM | POA: Diagnosis not present

## 2019-11-06 DIAGNOSIS — R0602 Shortness of breath: Secondary | ICD-10-CM | POA: Diagnosis not present

## 2019-11-06 DIAGNOSIS — R079 Chest pain, unspecified: Secondary | ICD-10-CM | POA: Diagnosis not present

## 2019-11-06 LAB — CBC
HCT: 46.6 % — ABNORMAL HIGH (ref 36.0–46.0)
Hemoglobin: 15.4 g/dL — ABNORMAL HIGH (ref 12.0–15.0)
MCH: 30.7 pg (ref 26.0–34.0)
MCHC: 33 g/dL (ref 30.0–36.0)
MCV: 93 fL (ref 80.0–100.0)
Platelets: 201 10*3/uL (ref 150–400)
RBC: 5.01 MIL/uL (ref 3.87–5.11)
RDW: 13.3 % (ref 11.5–15.5)
WBC: 5.5 10*3/uL (ref 4.0–10.5)
nRBC: 0 % (ref 0.0–0.2)

## 2019-11-06 LAB — BASIC METABOLIC PANEL
Anion gap: 11 (ref 5–15)
BUN: 10 mg/dL (ref 8–23)
CO2: 26 mmol/L (ref 22–32)
Calcium: 9.7 mg/dL (ref 8.9–10.3)
Chloride: 102 mmol/L (ref 98–111)
Creatinine, Ser: 0.67 mg/dL (ref 0.44–1.00)
GFR, Estimated: >60 mL/min (ref >60)
Glucose, Bld: 114 mg/dL — ABNORMAL HIGH (ref 70–99)
Potassium: 3 mmol/L — ABNORMAL LOW (ref 3.5–5.1)
Sodium: 139 mmol/L (ref 135–145)

## 2019-11-06 LAB — RESPIRATORY PANEL BY RT PCR (FLU A&B, COVID)
Influenza A by PCR: NEGATIVE
Influenza B by PCR: NEGATIVE
SARS Coronavirus 2 by RT PCR: POSITIVE — AB

## 2019-11-06 LAB — TROPONIN I (HIGH SENSITIVITY): Troponin I (High Sensitivity): 10 ng/L (ref <18)

## 2019-11-06 MED ORDER — ERYTHROMYCIN 5 MG/GM OP OINT
1.0000 "application " | TOPICAL_OINTMENT | Freq: Four times a day (QID) | OPHTHALMIC | Status: DC
  Administered 2019-11-06: 1 via OPHTHALMIC
  Filled 2019-11-06: qty 3.5

## 2019-11-06 MED ORDER — LIDOCAINE 5 % EX PTCH
1.0000 | MEDICATED_PATCH | CUTANEOUS | Status: DC
  Administered 2019-11-06: 1 via TRANSDERMAL
  Filled 2019-11-06: qty 1

## 2019-11-06 MED ORDER — ERYTHROMYCIN 5 MG/GM OP OINT: TOPICAL_OINTMENT | OPHTHALMIC | 0 refills | Status: DC

## 2019-11-06 NOTE — ED Provider Notes (Signed)
[facility] EMERGENCY DEPARTMENT Provider Note   CSN: [id number] Arrival date & time: 11/06/19  1055     History Chief Complaint  Patient presents with  . Shortness of Breath  . Cough  . Fall    Sherry Massiel Stipp is a 79 y.o. female.  The history is provided by the patient.  Cough Cough characteristics:  Non-productive Sputum characteristics:  Nondescript Severity:  Mild Onset quality:  Gradual Timing:  Intermittent Progression:  Waxing and waning Chronicity:  New Context: not sick contacts   Relieved by:  Nothing Worsened by:  Nothing Associated symptoms: chest pain (right rib pain since fall last week)   Associated symptoms: no chills, no ear pain, no eye discharge, no fever, no rash, no shortness of breath and no sore throat        Past Medical History:  Diagnosis Date  . Atrial flutter (HCC) 05/07/2014   With controlled ventricular rate on no AV nodal agents.  . Chronic rheumatic heart disease 05/05/2014  . Constipation    takes Dulcolax as needed  . 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 Essential hypertension    takes Amlodipine daily  . GERD (gastroesophageal reflux disease)    takes Pantoprazole daily  . History of blood transfusion    no abnormal reaction noted  . History of migraine    last one a week  . History of shingles   . Hypertensive retinopathy of both eyes 03/07/2016   Moderate  . Mitral valve regurgitation, rheumatic 05/05/2014   Moderate to severe with peak PA pressure of 56 mmHg on Echo 04/2014   . Muscle spasm of both lower legs    takes Zanaflex daily as needed  . Osteoarthritis    knees and hands  . Peripheral edema    takes Lasix daily as needed   . PONV (postoperative nausea and vomiting)    with recent dilatation of throat, had N/V  . Systolic murmur    per patient report she has had rheumatic fever in childhood    . Thoracic aortic atherosclerosis (HCC) 10/08/2019   Radiographic on CT chest 2020    Patient Active Problem List   Diagnosis Date Noted  . Fall 11/03/2019  . Thoracic aortic atherosclerosis (HCC) 10/08/2019  . Left leg pain 05/28/2019  . Acquired thrombophilia (HCC) 09/20/2018  . Cough 06/07/2017  . Hypertensive retinopathy of both eyes 03/07/2016  . Gastroesophageal reflux disease 10/01/2015  . Lichen sclerosus of female genitalia 02/05/2015  . Chronic constipation 11/06/2014  . Atrial flutter (HCC) 05/07/2014  . Chronic rheumatic heart disease 05/05/2014  . Mitral valve regurgitation, rheumatic 05/05/2014  . Cricopharyngeal achalasia 04/08/2014  . Sigmoid diverticulosis 03/16/2014  . Internal hemorrhoids 03/16/2014  . Fatigue 03/16/2014  . Obesity, Class I, BMI 30-34.9 04/26/2012  . Postmenopausal atrophic vaginitis 05/10/2011  . Routine health maintenance 11/07/2010  . Osteoporosis, post-menopausal 10/21/2010  . Primary osteoarthritis involving multiple joints 01/12/2006  . Essential hypertension 11/20/2005    Past Surgical History:  Procedure Laterality Date  . ABDOMINAL HYSTERECTOMY     Fibroids  . COLONOSCOPY W/ BIOPSIES AND POLYPECTOMY    . CYST EXCISION Right    on hand  . ESOPHAGOSCOPY WITH DILITATION N/A 04/14/2014   Procedure: RIGID ESOPHAGOSCOPY WITH DILITATION AND BOTOX  INJECTION;  Surgeon: Osborn Coho, MD;  Location: [facility] OR;  Service: ENT;  Laterality: N/A;  . MULTIPLE TOOTH EXTRACTIONS    .  TOTAL KNEE ARTHROPLASTY Right 04/06/2015  . TOTAL KNEE ARTHROPLASTY Right 04/06/2015   Procedure: RIGHT TOTAL KNEE ARTHROPLASTY;  Surgeon: Marcene Corning, MD;  Location: [facility] OR;  Service: Orthopedics;  Laterality: Right;  . TOTAL KNEE ARTHROPLASTY Left 09/14/2015   Procedure: TOTAL KNEE ARTHROPLASTY LEFT;  Surgeon: Marcene Corning, MD;  Location: [facility] OR;  Service: Orthopedics;  Laterality: Left;     OB History   No obstetric history on file.     Family History   Problem Relation Age of Onset  . Cancer Mother        Unknown type  . Stroke Father   . Unexplained death Sister   . Heart attack Sister   . Other Brother        Unknown health  . Healthy Daughter   . Healthy Son   . Asthma Sister   . Other Sister        Unknown health  . Other Brother        Unknown health  . Other Brother        Unknown health  . Cancer Brother        Unknown type  . Cancer Brother        Unknown type  . Other Brother        Accident  . Other Brother        Garment/textile technologist  . Healthy Daughter   . Healthy Daughter     Social History   Tobacco Use  . Smoking status: Never Smoker  . Smokeless tobacco: Never Used  Vaping Use  . Vaping Use: Never used  Substance Use Topics  . Alcohol use: No  . Drug use: No    Home Medications Prior to Admission medications   Medication Sig Start Date End Date Taking? Authorizing Provider  alendronate (FOSAMAX) 70 MG tablet Take 1 tablet (70 mg total) by mouth once a week. Take with a full glass of water on an empty stomach. 09/27/19   Miguel Aschoff, MD  amLODipine (NORVASC) 10 MG tablet Take 1 tablet (10 mg total) by mouth daily. 09/24/19   Miguel Aschoff, MD  apixaban (ELIQUIS) 5 MG TABS tablet Take 1 tablet (5 mg total) by mouth 2 (two) times daily. 10/03/19   Miguel Aschoff, MD  augmented betamethasone dipropionate (DIPROLENE-AF) 0.05 % ointment Apply topically daily. 03/07/19   Burns Spain, MD  diclofenac Sodium (VOLTAREN) 1 % GEL Apply 4 g topically 4 (four) times daily. 05/28/19   Eliezer Bottom, MD  erythromycin ophthalmic ointment Place a 1/2 inch ribbon of ointment into the lower eyelid 4 times a day until improved 11/06/19   Jadalyn Oliveri, DO  fluticasone (FLONASE) 50 MCG/ACT nasal spray SHAKE LIQUID AND USE 1 SPRAY IN EACH NOSTRIL DAILY 04/22/19   Burns Spain, MD  hydrochlorothiazide (HYDRODIURIL) 25 MG tablet Take 1 tablet (25 mg total) by mouth daily. 09/24/19   Miguel Aschoff, MD  omeprazole (PRILOSEC) 20 MG capsule Take 1 capsule (20 mg total) by mouth daily. Take on empty stomach, 30 minutes before bedtime snack 03/07/19   Burns Spain, MD  polyethylene glycol powder (GLYCOLAX/MIRALAX) powder Take 255 g by mouth daily as needed for severe constipation. 03/15/18   Doneen Poisson, MD  potassium chloride SA (KLOR-CON) 20 MEQ tablet Take 1 tablet (20 mEq total) by mouth daily. 03/07/19 03/01/20  Burns Spain, MD  tiZANidine (ZANAFLEX) 2 MG tablet Take 1 tablet (2 mg total) by mouth every  8 (eight) hours as needed for muscle spasms. 10/06/16   Doneen PoissonKlima, Lawrence, MD    Allergies    Ace inhibitors  Review of Systems   Review of Systems  Constitutional: Negative for chills and fever.  HENT: Negative for ear pain and sore throat.   Eyes: Negative for photophobia, pain, discharge, redness, itching and visual disturbance.       Left eyelid swelling last 4 days   Respiratory: Positive for cough. Negative for shortness of breath.   Cardiovascular: Positive for chest pain (right rib pain since fall last week). Negative for palpitations.  Gastrointestinal: Negative for abdominal pain and vomiting.  Genitourinary: Negative for dysuria and hematuria.  Musculoskeletal: Negative for arthralgias and back pain.  Skin: Negative for color change and rash.  Neurological: Negative for seizures and syncope.  All other systems reviewed and are negative.   Physical Exam Updated Vital Signs  ED Triage Vitals  Enc Vitals Group     BP 11/06/19 1059 (!) 179/118     Pulse Rate 11/06/19 1059 (!) 102     Resp 11/06/19 1059 18     Temp 11/06/19 1059 98.7 F (37.1 C)     Temp Source 11/06/19 1059 Oral     SpO2 11/06/19 1059 97 %     Weight 11/06/19 1059 205 lb (93 kg)     Height 11/06/19 1059 5\' 4"  (1.626 m)     Head Circumference --      Peak Flow --      Pain Score 11/06/19 1100 8     Pain Loc --      Pain Edu? --      Excl. in GC? --     Physical  Exam Vitals and nursing note reviewed.  Constitutional:      General: She is not in acute distress.    Appearance: She is well-developed.  HENT:     Head: Normocephalic and atraumatic.     Mouth/Throat:     Mouth: Mucous membranes are moist.  Eyes:     Extraocular Movements: Extraocular movements intact.     Conjunctiva/sclera: Conjunctivae normal.     Pupils: Pupils are equal, round, and reactive to light.     Comments: Swelling of the left upper eyelid, no eye redness and extraocular motions are intact  Cardiovascular:     Rate and Rhythm: Normal rate and regular rhythm.     Heart sounds: No murmur heard.   Pulmonary:     Effort: Pulmonary effort is normal. No respiratory distress.     Breath sounds: Normal breath sounds. No decreased breath sounds, wheezing or rhonchi.  Abdominal:     Palpations: Abdomen is soft.     Tenderness: There is no abdominal tenderness.  Musculoskeletal:     Cervical back: Normal range of motion and neck supple.     Right lower leg: No edema.     Left lower leg: No edema.     Comments: Tenderness to right anterior chest wall  Skin:    General: Skin is warm and dry.     Capillary Refill: Capillary refill takes less than 2 seconds.  Neurological:     General: No focal deficit present.     Mental Status: She is alert.     ED Results / Procedures / Treatments   Labs (all labs ordered are listed, but only abnormal results are displayed) Labs Reviewed  RESPIRATORY PANEL BY RT PCR (FLU A&B, COVID) - Abnormal; Notable for the following components:  Result Value   SARS Coronavirus 2 by RT PCR POSITIVE (*)    All other components within normal limits  BASIC METABOLIC PANEL - Abnormal; Notable for the following components:   Potassium 3.0 (*)    Glucose, Bld 114 (*)    All other components within normal limits  CBC - Abnormal; Notable for the following components:   Hemoglobin 15.4 (*)    HCT 46.6 (*)    All other components within normal  limits  TROPONIN I (HIGH SENSITIVITY)    EKG EKG Interpretation  Date/Time:  Thursday November 06 2019 10:59:09 EDT Ventricular Rate:  74 PR Interval:    QRS Duration: 88 QT Interval:  370 QTC Calculation: 410 R Axis:   24 Text Interpretation: Atrial fibrillation Cannot rule out Anterior infarct , age undetermined Marked ST abnormality, possible inferior subendocardial injury Abnormal ECG Confirmed by Virgina Norfolk [phone]) on 11/06/2019 12:31:37 PM   Radiology DG Chest 2 View  Result Date: 11/06/2019 CLINICAL DATA:  79 year old female with chest pain and shortness of breath. Painful coughing since fall last week with minimally displaced right 9th rib fracture. EXAM: CHEST - 2 VIEW COMPARISON:  Right rib series 11/03/2019 and earlier. FINDINGS: Chronic cardiomegaly, tortuous thoracic aorta. Stable lung volumes and mediastinal contours since last year. Visualized tracheal air column is within normal limits. Mild linear atelectasis in the left mid lung is new. No pneumothorax, pulmonary edema, pleural effusion or other acute pulmonary opacity. Lower lung volumes on the lateral today. Stable visualized osseous structures, left anterior 9th rib fracture poorly visible today. Negative visible bowel gas pattern. IMPRESSION: 1. Lower lung volumes with mild atelectasis but no other acute cardiopulmonary abnormality. 2. Recent left anterior 9th rib fracture poorly visible today. 3. Chronic cardiomegaly. Electronically Signed   By: Odessa Fleming M.D.   On: 11/06/2019 11:31    Procedures Procedures (including critical care time)  Medications Ordered in ED Medications  erythromycin ophthalmic ointment 1 application (1 application Left Eye Given 11/06/19 1329)  lidocaine (LIDODERM) 5 % 1 patch (1 patch Transdermal Patch Applied 11/06/19 1329)    ED Course  I have reviewed the triage vital signs and the nursing notes.  Pertinent labs & imaging results that were available during my care of the patient  were reviewed by me and considered in my medical decision making (see chart for details).    MDM Rules/Calculators/A&P                          Chelesea Weiand is a 79 year old female with history of reflux, hypertension who presents to the ED with cough, right-sided rib pain.  Patient with normal vitals.  No fever.  Has pain in the right side of her ribs since a fall last week.  Also some left eyelid swelling during the last 4 days as well.  No real shortness of breath or chest pain.  Overall appears well on exam.  Normal room air oxygenation.  No signs of respiratory distress.  Lab work done prior to my evaluation is overall unremarkable.  No significant anemia, electrolyte abnormality, kidney injury.  Troponin normal.  EKG without any ischemic changes.  No chest pain and doubt ACS.  Incidentally she tested positive for Covid.  She is vaccinated.  This x-ray overall unremarkable.  Old left rib fracture.  No major inflammation in the lungs.  Overall suspect the rib contusion may be also complicated by some COVID.  Recommend continued use of  Tylenol and may be over-the-counter lidocaine patches.  Overall suspect may be inflammation the eyelid is secondary to infectious process.  She has no obvious conjunctivitis.  Will start erythromycin eye ointment but recommend warm compresses to upper eyelid.  Given information to follow-up in the Covid clinic and they were notified for possible infusion treatment.  Given return precautions and discharged in the ED in good condition.  This chart was dictated using voice recognition software.  Despite best efforts to proofread,  errors can occur which can change the documentation meaning.   Alyrica Bryana Froemming was evaluated in Emergency Department on 11/06/2019 for the symptoms described in the history of present illness. She was evaluated in the context of the global COVID-19 pandemic, which necessitated consideration that the patient might be at risk for  infection with the SARS-CoV-2 virus that causes COVID-19. Institutional protocols and algorithms that pertain to the evaluation of patients at risk for COVID-19 are in a state of rapid change based on information released by regulatory bodies including the CDC and federal and state organizations. These policies and algorithms were followed during the patient's care in the ED.   Final Clinical Impression(s) / ED Diagnoses Final diagnoses:  COVID  Blepharitis of left upper eyelid, unspecified type    Rx / DC Orders ED Discharge Orders         Ordered    erythromycin ophthalmic ointment        11/06/19 1442           Virgina Norfolk, DO 11/06/19 1445

## 2019-11-06 NOTE — Telephone Encounter (Signed)
yes

## 2019-11-06 NOTE — Telephone Encounter (Signed)
t calls and states she is 'LOOSING HER BREATH",chest pain and cough. Denies fevers. + h/a. Feels tired. She is referred to ED for eval. Call 911. She is agreeable. Do you agree?

## 2019-11-06 NOTE — Discharge Instructions (Addendum)
Follow-up with ophthalmology as needed for inflammation of the eyelid.  Take eyedrops 4 times a day until improved.  Please return to the ED if you have worsening shortness of breath as you have coronavirus.  Covid clinic will be calling you about a possible appointment for antibody infusion treatment.

## 2019-11-06 NOTE — ED Triage Notes (Signed)
Pt reports cough with shortness of breath, worsening since last night. Also reports mechanical fall on Friday and has pain to right breast and rib cage.

## 2019-11-07 ENCOUNTER — Other Ambulatory Visit: Payer: Self-pay | Admitting: Physician Assistant

## 2019-11-07 ENCOUNTER — Telehealth: Payer: Self-pay | Admitting: Physician Assistant

## 2019-11-07 DIAGNOSIS — U071 COVID-19: Secondary | ICD-10-CM

## 2019-11-07 DIAGNOSIS — I10 Essential (primary) hypertension: Secondary | ICD-10-CM

## 2019-11-07 DIAGNOSIS — I4819 Other persistent atrial fibrillation: Secondary | ICD-10-CM

## 2019-11-07 NOTE — Telephone Encounter (Addendum)
I connected by phone with [name] on 11/07/2019 at 8:34 AM to discuss the potential use of a new treatment for mild to moderate COVID-19 viral infection in non-hospitalized patients.  This patient is a 79 y.o. female that meets the FDA criteria for Emergency Use Authorization of COVID monoclonal antibody sotrovimab, casirivimab/imdevimab or bamlamivimab/estevimab.  Has a (+) direct SARS-CoV-2 viral test result  Has mild or moderate COVID-19   Is NOT hospitalized due to COVID-19  Is within 10 days of symptom onset  Has at least one of the high risk factor(s) for progression to severe COVID-19 and/or hospitalization as defined in EUA.  Specific high risk criteria : Older age (>/= 79 yo), BMI > 25 and Cardiovascular disease or hypertension   I have spoken and communicated the following to the patient or parent/caregiver regarding COVID monoclonal antibody treatment:  1. FDA has authorized the emergency use for the treatment of mild to moderate COVID-19 in adults and pediatric patients with positive results of direct SARS-CoV-2 viral testing who are 75 years of age and older weighing at least 40 kg, and who are at high risk for progressing to severe COVID-19 and/or hospitalization.  2. The significant known and potential risks and benefits of COVID monoclonal antibody, and the extent to which such potential risks and benefits are unknown.  3. Information on available alternative treatments and the risks and benefits of those alternatives, including clinical trials.  4. Patients treated with COVID monoclonal antibody should continue to self-isolate and use infection control measures (e.g., wear mask, isolate, social distance, avoid sharing personal items, clean and disinfect "high touch" surfaces, and frequent handwashing) according to CDC guidelines.   5. The patient or parent/caregiver has the option to accept or refuse COVID monoclonal antibody treatment.  After reviewing this  information with the patient, the patient has agreed to receive one of the available covid 19 monoclonal antibodies and will be provided an appropriate fact sheet prior to infusion.  Sx onset 10/29. Set up for infusion on 11/6 @ 10:30am. Directions given to [facility]. Pt is aware that insurance will be charged an infusion fee. Pt is fully vaccinated.   Cline Crock 11/07/2019 8:34 AM

## 2019-11-08 ENCOUNTER — Ambulatory Visit (HOSPITAL_COMMUNITY)
Admission: RE | Admit: 2019-11-08 | Discharge: 2019-11-08 | Disposition: A | Discharge status: Home / Self Care | Payer: Medicare Other | Source: Ambulatory Visit | Attending: Pulmonary Disease | Admitting: Pulmonary Disease

## 2019-11-08 DIAGNOSIS — I10 Essential (primary) hypertension: Secondary | ICD-10-CM

## 2019-11-08 DIAGNOSIS — Z23 Encounter for immunization: Secondary | ICD-10-CM | POA: Diagnosis not present

## 2019-11-08 DIAGNOSIS — U071 COVID-19: Secondary | ICD-10-CM | POA: Insufficient documentation

## 2019-11-08 DIAGNOSIS — I4819 Other persistent atrial fibrillation: Secondary | ICD-10-CM

## 2019-11-08 MED ORDER — SOTROVIMAB 500 MG/8ML IV SOLN
500.0000 mg | Freq: Once | INTRAVENOUS | Status: AC
  Administered 2019-11-08: 500 mg via INTRAVENOUS

## 2019-11-08 MED ORDER — DIPHENHYDRAMINE HCL 50 MG/ML IJ SOLN: 50.0000 mg | Freq: Once | INTRAMUSCULAR | Status: DC | PRN

## 2019-11-08 MED ORDER — METHYLPREDNISOLONE SODIUM SUCC 125 MG IJ SOLR: 125.0000 mg | Freq: Once | INTRAMUSCULAR | Status: DC | PRN

## 2019-11-08 MED ORDER — FAMOTIDINE IN NACL 20-0.9 MG/50ML-% IV SOLN: 20.0000 mg | Freq: Once | INTRAVENOUS | Status: DC | PRN

## 2019-11-08 MED ORDER — EPINEPHRINE 0.3 MG/0.3ML IJ SOAJ: 0.3000 mg | Freq: Once | INTRAMUSCULAR | Status: DC | PRN

## 2019-11-08 MED ORDER — ALBUTEROL SULFATE HFA 108 (90 BASE) MCG/ACT IN AERS: 2.0000 | INHALATION_SPRAY | Freq: Once | RESPIRATORY_TRACT | Status: DC | PRN

## 2019-11-08 MED ORDER — SODIUM CHLORIDE 0.9 % IV SOLN: INTRAVENOUS | Status: DC | PRN

## 2019-11-08 NOTE — Discharge Instructions (Signed)
COVID-19 COVID-19 is a respiratory infection that is caused by a virus called severe acute respiratory syndrome coronavirus 2 (SARS-CoV-2). The disease is also known as coronavirus disease or novel coronavirus. In some people, the virus may not cause any symptoms. In others, it may cause a serious infection. The infection can get worse quickly and can lead to complications, such as:  Pneumonia, or infection of the lungs.  Acute respiratory distress syndrome or ARDS. This is a condition in which fluid build-up in the lungs prevents the lungs from filling with air and passing oxygen into the blood.  Acute respiratory failure. This is a condition in which there is not enough oxygen passing from the lungs to the body or when carbon dioxide is not passing from the lungs out of the body.  Sepsis or septic shock. This is a serious bodily reaction to an infection.  Blood clotting problems.  Secondary infections due to bacteria or fungus.  Organ failure. This is when your body's organs stop working. The virus that causes COVID-19 is contagious. This means that it can spread from person to person through droplets from coughs and sneezes (respiratory secretions). What are the causes? This illness is caused by a virus. You may catch the virus by:  Breathing in droplets from an infected person. Droplets can be spread by a person breathing, speaking, singing, coughing, or sneezing.  Touching something, like a table or a doorknob, that was exposed to the virus (contaminated) and then touching your mouth, nose, or eyes. What increases the risk? Risk for infection You are more likely to be infected with this virus if you:  Are within 6 feet (2 meters) of a person with COVID-19.  Provide care for or live with a person who is infected with COVID-19.  Spend time in crowded indoor spaces or live in shared housing. Risk for serious illness You are more likely to become seriously ill from the virus if  you:  Are 50 years of age or older. The higher your age, the more you are at risk for serious illness.  Live in a nursing home or long-term care facility.  Have cancer.  Have a long-term (chronic) disease such as: ? Chronic lung disease, including chronic obstructive pulmonary disease or asthma. ? A long-term disease that lowers your body's ability to fight infection (immunocompromised). ? Heart disease, including heart failure, a condition in which the arteries that lead to the heart become narrow or blocked (coronary artery disease), a disease which makes the heart muscle thick, weak, or stiff (cardiomyopathy). ? Diabetes. ? Chronic kidney disease. ? Sickle cell disease, a condition in which red blood cells have an abnormal "sickle" shape. ? Liver disease.  Are obese. What are the signs or symptoms? Symptoms of this condition can range from mild to severe. Symptoms may appear any time from 2 to 14 days after being exposed to the virus. They include:  A fever or chills.  A cough.  Difficulty breathing.  Headaches, body aches, or muscle aches.  Runny or stuffy (congested) nose.  A sore throat.  New loss of taste or smell. Some people may also have stomach problems, such as nausea, vomiting, or diarrhea. Other people may not have any symptoms of COVID-19. How is this diagnosed? This condition may be diagnosed based on:  Your signs and symptoms, especially if: ? You live in an area with a COVID-19 outbreak. ? You recently traveled to or from an area where the virus is common. ? You   provide care for or live with a person who was diagnosed with COVID-19. ? You were exposed to a person who was diagnosed with COVID-19.  A physical exam.  Lab tests, which may include: ? Taking a sample of fluid from the back of your nose and throat (nasopharyngeal fluid), your nose, or your throat using a swab. ? A sample of mucus from your lungs (sputum). ? Blood tests.  Imaging tests,  which may include, X-rays, CT scan, or ultrasound. How is this treated? At present, there is no medicine to treat COVID-19. Medicines that treat other diseases are being used on a trial basis to see if they are effective against COVID-19. Your health care provider will talk with you about ways to treat your symptoms. For most people, the infection is mild and can be managed at home with rest, fluids, and over-the-counter medicines. Treatment for a serious infection usually takes places in a hospital intensive care unit (ICU). It may include one or more of the following treatments. These treatments are given until your symptoms improve.  Receiving fluids and medicines through an IV.  Supplemental oxygen. Extra oxygen is given through a tube in the nose, a face mask, or a hood.  Positioning you to lie on your stomach (prone position). This makes it easier for oxygen to get into the lungs.  Continuous positive airway pressure (CPAP) or bi-level positive airway pressure (BPAP) machine. This treatment uses mild air pressure to keep the airways open. A tube that is connected to a motor delivers oxygen to the body.  Ventilator. This treatment moves air into and out of the lungs by using a tube that is placed in your windpipe.  Tracheostomy. This is a procedure to create a hole in the neck so that a breathing tube can be inserted.  Extracorporeal membrane oxygenation (ECMO). This procedure gives the lungs a chance to recover by taking over the functions of the heart and lungs. It supplies oxygen to the body and removes carbon dioxide. Follow these instructions at home: Lifestyle  If you are sick, stay home except to get medical care. Your health care provider will tell you how long to stay home. Call your health care provider before you go for medical care.  Rest at home as told by your health care provider.  Do not use any products that contain nicotine or tobacco, such as cigarettes,  e-cigarettes, and chewing tobacco. If you need help quitting, ask your health care provider.  Return to your normal activities as told by your health care provider. Ask your health care provider what activities are safe for you. General instructions  Take over-the-counter and prescription medicines only as told by your health care provider.  Drink enough fluid to keep your urine pale yellow.  Keep all follow-up visits as told by your health care provider. This is important. How is this prevented?  There is no vaccine to help prevent COVID-19 infection. However, there are steps you can take to protect yourself and others from this virus. To protect yourself:   Do not travel to areas where COVID-19 is a risk. The areas where COVID-19 is reported change often. To identify high-risk areas and travel restrictions, check the CDC travel website: wwwnc.cdc.gov/travel/notices  If you live in, or must travel to, an area where COVID-19 is a risk, take precautions to avoid infection. ? Stay away from people who are sick. ? Wash your hands often with soap and water for 20 seconds. If soap and water   are not available, use an alcohol-based hand sanitizer. ? Avoid touching your mouth, face, eyes, or nose. ? Avoid going out in public, follow guidance from your state and local health authorities. ? If you must go out in public, wear a cloth face covering or face mask. Make sure your mask covers your nose and mouth. ? Avoid crowded indoor spaces. Stay at least 6 feet (2 meters) away from others. ? Disinfect objects and surfaces that are frequently touched every day. This may include:  Counters and tables.  Doorknobs and light switches.  Sinks and faucets.  Electronics, such as phones, remote controls, keyboards, computers, and tablets. To protect others: If you have symptoms of COVID-19, take steps to prevent the virus from spreading to others.  If you think you have a COVID-19 infection, contact  your health care provider right away. Tell your health care team that you think you may have a COVID-19 infection.  Stay home. Leave your house only to seek medical care. Do not use public transport.  Do not travel while you are sick.  Wash your hands often with soap and water for 20 seconds. If soap and water are not available, use alcohol-based hand sanitizer.  Stay away from other members of your household. Let healthy household members care for children and pets, if possible. If you have to care for children or pets, wash your hands often and wear a mask. If possible, stay in your own room, separate from others. Use a different bathroom.  Make sure that all people in your household wash their hands well and often.  Cough or sneeze into a tissue or your sleeve or elbow. Do not cough or sneeze into your hand or into the air.  Wear a cloth face covering or face mask. Make sure your mask covers your nose and mouth. Where to find more information  Centers for Disease Control and Prevention: www.cdc.gov/coronavirus/2019-ncov/index.html  World Health Organization: www.who.int/health-topics/coronavirus Contact a health care provider if:  You live in or have traveled to an area where COVID-19 is a risk and you have symptoms of the infection.  You have had contact with someone who has COVID-19 and you have symptoms of the infection. Get help right away if:  You have trouble breathing.  You have pain or pressure in your chest.  You have confusion.  You have bluish lips and fingernails.  You have difficulty waking from sleep.  You have symptoms that get worse. These symptoms may represent a serious problem that is an emergency. Do not wait to see if the symptoms will go away. Get medical help right away. Call your local emergency services (911 in the [place] Do not drive yourself to the hospital. Let the emergency medical personnel know if you think you have  COVID-19. Summary  COVID-19 is a respiratory infection that is caused by a virus. It is also known as coronavirus disease or novel coronavirus. It can cause serious infections, such as pneumonia, acute respiratory distress syndrome, acute respiratory failure, or sepsis.  The virus that causes COVID-19 is contagious. This means that it can spread from person to person through droplets from breathing, speaking, singing, coughing, or sneezing.  You are more likely to develop a serious illness if you are 50 years of age or older, have a weak immune system, live in a nursing home, or have chronic disease.  There is no medicine to treat COVID-19. Your health care provider will talk with you about ways to treat your symptoms.    Take steps to protect yourself and others from infection. Wash your hands often and disinfect objects and surfaces that are frequently touched every day. Stay away from people who are sick and wear a mask if you are sick. This information is not intended to replace advice given to you by your health care provider. Make sure you discuss any questions you have with your health care provider. Document Revised: 10/18/2018 Document Reviewed: 01/24/2018 Elsevier Patient Education  2020 [employer] Inc. What types of side effects do monoclonal antibody drugs cause?  Common side effects  In general, the more common side effects caused by monoclonal antibody drugs include: . Allergic reactions, such as hives or itching . Flu-like signs and symptoms, including chills, fatigue, fever, and muscle aches and pains . Nausea, vomiting . Diarrhea . Skin rashes . Low blood pressure   The CDC is recommending patients who receive monoclonal antibody treatments wait at least 90 days before being vaccinated.  Currently, there are no data on the safety and efficacy of mRNA COVID-19 vaccines in persons who received monoclonal antibodies or convalescent plasma as part of COVID-19 treatment. Based  on the estimated half-life of such therapies as well as evidence suggesting that reinfection is uncommon in the 90 days after initial infection, vaccination should be deferred for at least 90 days, as a precautionary measure until additional information becomes available, to avoid interference of the antibody treatment with vaccine-induced immune responses. 

## 2019-11-08 NOTE — Progress Notes (Signed)
  Diagnosis: COVID-19  Physician:dr wright  Procedure: sotrovimab  Complications: No immediate complications noted.  Discharge: Discharged home   Harding Thomure S Dafna Romo 11/08/2019  

## 2019-11-14 ENCOUNTER — Telehealth: Payer: Self-pay | Admitting: Internal Medicine

## 2019-11-14 NOTE — Telephone Encounter (Signed)
Pt was called per referral from [facility] ED. Pt  States that she is doing much better

## 2019-12-10 NOTE — Addendum Note (Signed)
Addended by: Charissa Bash A on: 12/10/2019 01:52 PM   Modules accepted: Orders

## 2020-02-19 ENCOUNTER — Encounter: Payer: Self-pay | Admitting: Internal Medicine

## 2020-02-19 ENCOUNTER — Other Ambulatory Visit: Payer: Self-pay

## 2020-02-19 ENCOUNTER — Ambulatory Visit (INDEPENDENT_AMBULATORY_CARE_PROVIDER_SITE_OTHER): Payer: Medicare Other | Admitting: Internal Medicine

## 2020-02-19 VITALS — BP 137/83 | HR 65 | Temp 97.6°F | Ht 64.0 in | Wt 208.6 lb

## 2020-02-19 DIAGNOSIS — K219 Gastro-esophageal reflux disease without esophagitis: Secondary | ICD-10-CM

## 2020-02-19 DIAGNOSIS — I10 Essential (primary) hypertension: Secondary | ICD-10-CM

## 2020-02-19 DIAGNOSIS — Z0000 Encounter for general adult medical examination without abnormal findings: Secondary | ICD-10-CM

## 2020-02-19 DIAGNOSIS — D6869 Other thrombophilia: Secondary | ICD-10-CM

## 2020-02-19 DIAGNOSIS — M8949 Other hypertrophic osteoarthropathy, multiple sites: Secondary | ICD-10-CM | POA: Diagnosis not present

## 2020-02-19 DIAGNOSIS — M159 Polyosteoarthritis, unspecified: Secondary | ICD-10-CM

## 2020-02-19 DIAGNOSIS — R059 Cough, unspecified: Secondary | ICD-10-CM | POA: Diagnosis not present

## 2020-02-19 DIAGNOSIS — H35033 Hypertensive retinopathy, bilateral: Secondary | ICD-10-CM

## 2020-02-19 DIAGNOSIS — K5909 Other constipation: Secondary | ICD-10-CM | POA: Diagnosis not present

## 2020-02-19 DIAGNOSIS — M810 Age-related osteoporosis without current pathological fracture: Secondary | ICD-10-CM | POA: Diagnosis not present

## 2020-02-19 DIAGNOSIS — N904 Leukoplakia of vulva: Secondary | ICD-10-CM

## 2020-02-19 NOTE — Progress Notes (Signed)
CC: "I am doing well, praise the Lord"  HPI: [name] is here for routine follow-up of chronic conditions including HTN and osteoporosis.  Since I last visit, she developed a mild case of Covid for which she received infusion therapy and has experienced no sequelae.  Her husband had a more difficult time, but he is also doing well now.  Cough longtime years nonproductive, takes robitussin and nasal steroid. No heartburn sxs doesn't know if she takes anything for it.   No problems breathing, but on further questioning she notes that she sometimes wakes up at night with [facility].  Waits sitting up on side of bed, drinks something, lays back down and is ok.  Happens a couple times a week..  Hasn't noticed any palpitations.  No increased edema.  Takes diuretic as prescribed No chest tightness or palpitations, "my heart doesn't bother me".  Appt next mo with cardiologist per her report.  Still itching in vulva, uses topical steroid every 3 days or so - chronic lichen sclerosis Mammogram needs order  Chronic constipation managed with OTC medication from [employer] "5 mg pill". Hasn't taken PEG.    Patient Active Problem List   Diagnosis Date Noted  . Thoracic aortic atherosclerosis (HCC) 10/08/2019  . Acquired thrombophilia (HCC) 09/20/2018  . Cough 06/07/2017  . Hypertensive retinopathy of both eyes 03/07/2016  . Gastroesophageal reflux disease 10/01/2015  . Lichen sclerosus of female genitalia 02/05/2015  . Chronic constipation 11/06/2014  . Atrial flutter (HCC) 05/07/2014  . Chronic rheumatic heart disease 05/05/2014  . Mitral valve regurgitation, rheumatic 05/05/2014  . Cricopharyngeal achalasia 04/08/2014  . Sigmoid diverticulosis 03/16/2014  . Internal hemorrhoids 03/16/2014  . Obesity, Class I, BMI 30-34.9 04/26/2012  . Postmenopausal atrophic vaginitis 05/10/2011  . Routine health maintenance 11/07/2010  . Osteoporosis, post-menopausal 10/21/2010  . Primary osteoarthritis involving  multiple joints 01/12/2006  . Essential hypertension 11/20/2005     Current Outpatient Medications:  .  alendronate (FOSAMAX) 70 MG tablet, Take 1 tablet (70 mg total) by mouth once a week. Take with a full glass of water on an empty stomach., Disp: 12 tablet, Rfl: 3 .  amLODipine (NORVASC) 10 MG tablet, Take 1 tablet (10 mg total) by mouth daily., Disp: 90 tablet, Rfl: 3 .  apixaban (ELIQUIS) 5 MG TABS tablet, Take 1 tablet (5 mg total) by mouth 2 (two) times daily., Disp: 180 tablet, Rfl: 3 .  augmented betamethasone dipropionate (DIPROLENE-AF) 0.05 % ointment, Apply topically daily., Disp: 30 g, Rfl: 11 .  fluticasone (FLONASE) 50 MCG/ACT nasal spray, SHAKE LIQUID AND USE 1 SPRAY IN EACH NOSTRIL DAILY, Disp: 48 g, Rfl: 5 .  hydrochlorothiazide (HYDRODIURIL) 25 MG tablet, Take 1 tablet (25 mg total) by mouth daily., Disp: 90 tablet, Rfl: 3 .  potassium chloride SA (KLOR-CON) 20 MEQ tablet, Take 1 tablet (20 mEq total) by mouth daily., Disp: 90 tablet, Rfl: 3 .  erythromycin ophthalmic ointment, Place a 1/2 inch ribbon of ointment into the lower eyelid 4 times a day until improved, Disp: 3.5 g, Rfl: 0   PE: 137/83, HR 65, weight 208 LB 9.6 LOC (BMI 35.8) O2 98% RA Lungs clear, easy resps, no jvd, trace LE edema which is chronic Heart irreg irreg prominent syst murmur base  A/P: Med reconciliation reviewed today.  BMP and vit D level today.  No changes in medications.  Appointments upcoming with ophthalmologist and with cardiologist.  Mammogram ordered.   See problem list documentation for details.  Greater than 50%  of this visit spent in education and discussion of treatment goals.  F/U:  68M

## 2020-02-19 NOTE — Assessment & Plan Note (Signed)
Tolerating alendronate without esophageal side effects.  Check vitamin D and replenish if necessary.  She should be on a daily vitamin D and calcium supplement, dose to be determined.

## 2020-02-19 NOTE — Patient Instructions (Addendum)
[name], It was wonderful to see you today and to catch up!    Today we discussed that you feel you are doing well.  You mentioned you have a cardiologist appt coming up, and that you are due for a mammogram. I will order this.  You mentioned that you have an appt with the eye doctor as well.    Today we reviewed your medicines.  You will have some blood work done today to check your potassium, and your vitamin D level.    We will get together in about 6 months, but we are here if you need to come in sooner.  Take care and stay well!  Dr. Murtis Sink

## 2020-02-19 NOTE — Assessment & Plan Note (Signed)
Cough continues unchanged for which she takes Robitussin.  She has become accustomed to the cough.  She has no heartburn symptoms.  This is a very chronic problem, CT done in the past, no evidence of interstitial lung disease.

## 2020-02-19 NOTE — Assessment & Plan Note (Signed)
Continues to cause pruritus.  Uses steroid cream every few days for relief of symptoms which is effective.  We discussed this condition as being not sexually transmitted and not contagious.  She was very relieved.

## 2020-02-19 NOTE — Assessment & Plan Note (Signed)
BP is adequately controlled with amlodipine 10 mg, HCTZ 25 mg.  Continue to monitor.

## 2020-02-19 NOTE — Assessment & Plan Note (Addendum)
Annual mammogram ordered. UTD on all immunizations. UTD on CRC screening.

## 2020-02-20 LAB — BMP8+ANION GAP
Anion Gap: 18 mmol/L (ref 10.0–18.0)
BUN/Creatinine Ratio: 11 — ABNORMAL LOW (ref 12–28)
BUN: 7 mg/dL — ABNORMAL LOW (ref 8–27)
CO2: 21 mmol/L (ref 20–29)
Calcium: 10.2 mg/dL (ref 8.7–10.3)
Chloride: 100 mmol/L (ref 96–106)
Creatinine, Ser: 0.62 mg/dL (ref 0.57–1.00)
GFR calc Af Amer: 99 mL/min/{1.73_m2} (ref >59)
GFR calc non Af Amer: 86 mL/min/{1.73_m2} (ref >59)
Glucose: 81 mg/dL (ref 65–99)
Potassium: 4.1 mmol/L (ref 3.5–5.2)
Sodium: 139 mmol/L (ref 134–144)

## 2020-02-20 LAB — VITAMIN D 25 HYDROXY (VIT D DEFICIENCY, FRACTURES): Vit D, 25-Hydroxy: 20.4 ng/mL — ABNORMAL LOW (ref 30.0–100.0)

## 2020-03-02 ENCOUNTER — Other Ambulatory Visit: Payer: Self-pay

## 2020-03-02 ENCOUNTER — Encounter: Payer: Self-pay | Admitting: Internal Medicine

## 2020-03-02 ENCOUNTER — Ambulatory Visit (INDEPENDENT_AMBULATORY_CARE_PROVIDER_SITE_OTHER): Payer: Medicare Other | Admitting: Internal Medicine

## 2020-03-02 DIAGNOSIS — R059 Cough, unspecified: Secondary | ICD-10-CM

## 2020-03-02 DIAGNOSIS — Z0000 Encounter for general adult medical examination without abnormal findings: Secondary | ICD-10-CM | POA: Diagnosis not present

## 2020-03-02 DIAGNOSIS — N904 Leukoplakia of vulva: Secondary | ICD-10-CM

## 2020-03-02 MED ORDER — FLUTICASONE PROPIONATE 50 MCG/ACT NA SUSP: NASAL | 5 refills | Status: DC

## 2020-03-02 MED ORDER — BETAMETHASONE DIPROPIONATE AUG 0.05 % EX OINT: TOPICAL_OINTMENT | Freq: Every day | CUTANEOUS | 11 refills | Status: DC

## 2020-03-02 NOTE — Assessment & Plan Note (Signed)
Continues to prefer OTC medication (a 5 mg pill from [employer]).

## 2020-03-02 NOTE — Assessment & Plan Note (Signed)
Anticoagulation continues with apixaban, tolerated well w/o bleeding complications.  Continue therapy and monitor.

## 2020-03-02 NOTE — Progress Notes (Signed)
I discussed the AWV findings with the RN who conducted the visit. I was present in the office suite and immediately available to provide assistance and direction throughout the time the service was provided.  Theotis Barrio, MD 03/02/2020, 3:43 PM  PGY-3, [facility] Pager: [phone] After 5pm on weekdays and 1pm on weekends: On Call Pager: [phone]

## 2020-03-02 NOTE — Assessment & Plan Note (Signed)
Ophthalmology appt upcoming for ongoing monitoring.

## 2020-03-02 NOTE — Assessment & Plan Note (Signed)
No reported symptoms of GERD such as heartburn discomfort or backwash. She has been treated in the past empirically for GERD cough with PPI, though is no longer taking medication for this.  In the past there was perhaps some reported improvement with the PPI but at today's visit she doesn't recall that her years-long cough has changed over time. Will resolve this problem but continue to observe.

## 2020-03-02 NOTE — Patient Instructions (Addendum)
Things That May Be Affecting Your Health:  Alcohol  Hearing loss  Pain    Depression  Home Safety  Sexual Health   Diabetes  Lack of physical activity  Stress   Difficulty with daily activities  Loneliness  Tiredness   Drug use  Medicines  Tobacco use   Falls  Motor Vehicle Safety  Weight   Food choices  Oral Health x Other vision, care giver fatigue??     YOUR PERSONALIZED HEALTH PLAN : 1. Schedule your next subsequent Medicare Wellness visit in one year 2. Attend all of your regular appointments to address your medical issues 3. Complete regular, routine screenings as discussed. 4.  Keep your appointment for your mammogram in April. 5.  Congratulations on setting a goal of increasing your activity level!  Begin seated and standing exercises with exercise band to increase strength and balance.   Annual Wellness Visit                       Medicare Covered Preventative Screenings and Services  Services & Screenings Men and Women Who How Often Need? Date of Last Service Action  Abdominal Aortic Aneurysm Adults with AAA risk factors Once     Alcohol Misuse and Counseling All Adults Screening once a year if no alcohol misuse. Counseling up to 4 face to face sessions.     Bone Density Measurement  Adults at risk for osteoporosis Once every 2 yrs     Lipid Panel Z13.6 All adults without CV disease Once every 5 yrs     Colorectal Cancer   Stool sample or  Colonoscopy All adults 50 and older   Once every year  Every 10 years     Depression All Adults Once a year  Today   Diabetes Screening Blood glucose, post glucose load, or GTT Z13.1  All adults at risk  Pre-diabetics  Once per year  Twice per year     Diabetes  Self-Management Training All adults Diabetics 10 hrs first year; 2 hours subsequent years. Requires Copay     Glaucoma  Diabetics  Family history of glaucoma  African Americans 50 yrs +  Hispanic Americans 65  yrs + Annually - requires coppay     Hepatitis C Z72.89 or F19.20  High Risk for HCV  Born between 1945 and 1965  Annually  Once     HIV Z11.4 All adults based on risk  Annually btw ages 84 & 44 regardless of risk  Annually > 65 yrs if at increased risk     Lung Cancer Screening Asymptomatic adults aged 17-77 with 30 pack yr history and current smoker OR quit within the last 15 yrs Annually Must have counseling and shared decision making documentation before first screen     Medical Nutrition Therapy Adults with   Diabetes  Renal disease  Kidney transplant within past 3 yrs 3 hours first year; 2 hours subsequent years     Obesity and Counseling All adults Screening once a year Counseling if BMI 30 or higher  Today   Tobacco Use Counseling Adults who use tobacco  Up to 8 visits in one year     Vaccines Z23  Hepatitis B  Influenza   Pneumonia  Adults   Once  Once every flu season  Two different vaccines separated by one year   Needs Tdap if has payor source  Next Annual Wellness Visit People with Medicare Every year  Today  Services & Screenings Women Who How Often Need  Date of Last Service Action  Mammogram  Z12.31 Women over 69 One baseline ages 63-39. Annually ager 40 yrs+     Pap tests All women Annually if high risk. Every 2 yrs for normal risk women     Screening for cervical cancer with   Pap (Z01.419 nl or Z01.411abnl) &  HPV Z11.51 Women aged 68 to 74 Once every 5 yrs     Screening pelvic and breast exams All women Annually if high risk. Every 2 yrs for normal risk women     Sexually Transmitted Diseases  Chlamydia  Gonorrhea  Syphilis All at risk adults Annually for non pregnant females at increased risk         Services & Screenings Men Who How Ofter Need  Date of Last Service Action  Prostate Cancer - DRE & PSA Men over 50 Annually.  DRE might require a copay.     Sexually Transmitted  Diseases  Syphilis All at risk adults Annually for men at increased risk       Fall Prevention in the Home, Adult Falls can cause injuries and can happen to people of all ages. There are many things you can do to make your home safe and to help prevent falls. Ask for help when making these changes. What actions can I take to prevent falls? General Instructions  Use good lighting in all rooms. Replace any light bulbs that burn out.  Turn on the lights in dark areas. Use night-lights.  Keep items that you use often in easy-to-reach places. Lower the shelves around your home if needed.  Set up your furniture so you have a clear path. Avoid moving your furniture around.  Do not have throw rugs or other things on the floor that can make you trip.  Avoid walking on wet floors.  If any of your floors are uneven, fix them.  Add color or contrast paint or tape to clearly mark and help you see: ? Grab bars or handrails. ? First and last steps of staircases. ? Where the edge of each step is.  If you use a stepladder: ? Make sure that it is fully opened. Do not climb a closed stepladder. ? Make sure the sides of the stepladder are locked in place. ? Ask someone to hold the stepladder while you use it.  Know where your pets are when moving through your home. What can I do in the bathroom?  Keep the floor dry. Clean up any water on the floor right away.  Remove soap buildup in the tub or shower.  Use nonskid mats or decals on the floor of the tub or shower.  Attach bath mats securely with double-sided, nonslip rug tape.  If you need to sit down in the shower, use a plastic, nonslip stool.  Install grab bars by the toilet and in the tub and shower. Do not use towel bars as grab bars.      What can I do in the bedroom?  Make sure that you have a light by your bed that is easy to reach.  Do not use any sheets or blankets for your bed that hang to the floor.  Have a firm  chair with side arms that you can use for support when you get dressed. What can I do in the kitchen?  Clean up any spills right away.  If you need to reach something above you, use a step  stool with a grab bar.  Keep electrical cords out of the way.  Do not use floor polish or wax that makes floors slippery. What can I do with my stairs?  Do not leave any items on the stairs.  Make sure that you have a light switch at the top and the bottom of the stairs.  Make sure that there are handrails on both sides of the stairs. Fix handrails that are broken or loose.  Install nonslip stair treads on all your stairs.  Avoid having throw rugs at the top or bottom of the stairs.  Choose a carpet that does not hide the edge of the steps on the stairs.  Check carpeting to make sure that it is firmly attached to the stairs. Fix carpet that is loose or worn. What can I do on the outside of my home?  Use bright outdoor lighting.  Fix the edges of walkways and driveways and fix any cracks.  Remove anything that might make you trip as you walk through a door, such as a raised step or threshold.  Trim any bushes or trees on paths to your home.  Check to see if handrails are loose or broken and that both sides of all steps have handrails.  Install guardrails along the edges of any raised decks and porches.  Clear paths of anything that can make you trip, such as tools or rocks.  Have leaves, snow, or ice cleared regularly.  Use sand or salt on paths during winter.  Clean up any spills in your garage right away. This includes grease or oil spills. What other actions can I take?  Wear shoes that: ? Have a low heel. Do not wear high heels. ? Have rubber bottoms. ? Feel good on your feet and fit well. ? Are closed at the toe. Do not wear open-toe sandals.  Use tools that help you move around if needed. These include: ? Canes. ? Walkers. ? Scooters. ? Crutches.  Review your  medicines with your doctor. Some medicines can make you feel dizzy. This can increase your chance of falling. Ask your doctor what else you can do to help prevent falls. Where to find more information  Centers for Disease Control and Prevention, [employer]: FootballExhibition.com.br  [employer] on Aging: https://walker.com/ Contact a doctor if:  You are afraid of falling at home.  You feel weak, drowsy, or dizzy at home.  You fall at home. Summary  There are many simple things that you can do to make your home safe and to help prevent falls.  Ways to make your home safe include removing things that can make you trip and installing grab bars in the bathroom.  Ask for help when making these changes in your home. This information is not intended to replace advice given to you by your health care provider. Make sure you discuss any questions you have with your health care provider. Document Revised: 07/23/2019 Document Reviewed: 07/23/2019 Elsevier Patient Education  2021 [employer].

## 2020-03-02 NOTE — Assessment & Plan Note (Signed)
Joint pain not acutely worsened, she remains physically functionally.  Acetaminophen prn.

## 2020-03-02 NOTE — Progress Notes (Signed)
This AWV is being conducted by TELEHEALTH - AUDIO only. The patient was located at home and I was located in [facility]. The patient's identity was confirmed using their DOB and current address. The patient or his/her legal guardian has consented to being evaluated through a telephone encounter and understands the associated risks (an examination cannot be done and the patient may need to come in for an appointment) / benefits (allows the patient to remain at home, decreasing exposure to coronavirus). I personally spent 45 minutes conducting the AWV.  Subjective:   [name] is a 80 y.o. female who presents for a Medicare Annual Wellness Visit.  The following items have been reviewed and updated today in the appropriate area in the EMR.   Health Risk Assessment  Height, weight, BMI, and BP Visual acuity if needed Depression screen Fall risk / safety level Advance directive discussion Medical and family history were reviewed and updated Updating list of other providers & suppliers Medication reconciliation, including over the counter medicines Cognitive screen Written screening schedule Risk Factor list Personalized health advice, risky behaviors, and treatment advice  Social History   Social History Narrative   Current Social History 03/02/2020        Patient lives with spouse in a/an home / condo / townhome which is 1 story/stories. There are not steps up to the entrance the patient uses.       Patient's method of transportation is personal car.      The highest level of education was junior high / middle school.      The patient currently retired.      Identified important Relationships are God, husband, kids       Pets : none       Interests / Fun: "Walking into stores and looking around.  Riding around"       Current Stressors: "Bills, the war"       Religious / Personal Beliefs:        Other:           Objective:    Vitals: There were no vitals taken for  this visit. Vitals are unable to obtained due to COVID-19 public health emergency  Activities of Daily Living In your present state of health, do you have any difficulty performing the following activities: 03/02/2020 02/19/2020  Hearing? N N  Vision? N N  Difficulty concentrating or making decisions? N N  Walking or climbing stairs? N N  Dressing or bathing? N N  Doing errands, shopping? N N  Some recent data might be hidden    Goals Goals    .  Blood Pressure < 150/90    .  Find part-time job (pt-stated)    .  Increase physical activity      Begin seated and standing exercises with exercise band to increase strength and balance.     .  Ride stationary bike. Start with once daily and work up to twice daily.     .  Weight < 190 lb (86.183 kg)       Fall Risk Fall Risk  03/02/2020 02/19/2020 11/03/2019 10/09/2019 05/28/2019  Falls in the past year? 1 1 1  0 1  Number falls in past yr: 0 0 0 0 0  Comment - - - - -  Injury with Fall? 1 1 1  0 0  Risk for fall due to : Impaired balance/gait - History of fall(s) - History of fall(s);Impaired balance/gait  Risk for fall due  to: Comment - - - - -  Follow up Falls evaluation completed - Falls evaluation completed - Falls prevention discussed    Depression Screen PHQ 2/9 Scores 03/02/2020 02/19/2020 10/09/2019 05/28/2019  PHQ - 2 Score 1 0 0 1  PHQ- 9 Score 3 - 0 13     Cognitive Testing Six-Item Cognitive Screener   "I would like to ask you some questions that ask you to use your memory. I am going to name three objects. Please wait until I say all three words, then repeat them. Remember what they are  because I am going to ask you to name them again in a few minutes. Please repeat these words for me: APPLE-TABLE-PENNY." (Interviewer may repeat names 3 times if necessary but repetition not scored.)  Did patient correctly repeat all three words? Yes - may proceed with screen  What year is this? Correct What month is this? Correct What day  of the week is this? Correct  What were the three objects I asked you to remember? . Apple Correct . Table Correct . Penny Incorrect  Score one point for each incorrect answer.  A score of 2 or more points warrants additional investigation.  Patient's score 1   CDC Handout on Fall Prevention and Handout on Home Exercise Program, Access codes [email or web address] and [id number] given/mailed to patient with exercise band.      Assessment and Plan:   Patient would like to know how much Vitamin D she should take daily?  She currently has 600IU on hand.  Please see last Vitamin D level and advise.  Pt is requesting refills on flonase and steroid cream, tagged for refill.  Patient has made an appointment for her mammogram for April, 2022.   Patient has made a goal to increase her activity level by beginning seated and standing exercises with exercise band to increase strength and balance.  During the course of the visit the patient was educated and counseled about appropriate screening and preventive services as documented in the assessment and plan.  The printed AVS was given to the patient and included an updated screening schedule, a list of risk factors, and personalized health advice.        Skip Estimable, RN  03/02/2020

## 2020-03-03 NOTE — Progress Notes (Signed)
[facility] Clinic Attending  Case discussed with Dr. Lee soon after the visit.  We reviewed the AWV findings.  I agree with the assessment, diagnosis, and plan of care documented in the AWV note.     

## 2020-03-11 ENCOUNTER — Other Ambulatory Visit: Payer: Self-pay | Admitting: Internal Medicine

## 2020-03-11 DIAGNOSIS — M810 Age-related osteoporosis without current pathological fracture: Secondary | ICD-10-CM

## 2020-03-11 MED ORDER — VITAMIN D3 50 MCG (2000 UT) PO TABS: 2000.0000 [IU] | ORAL_TABLET | Freq: Every day | ORAL | 3 refills | Status: DC

## 2020-03-24 NOTE — Progress Notes (Signed)
Cardiology Office Note:    Date:  03/26/2020   ID:  Renard HamperLizzie Harrison [name], DOB 1940/09/05, MRN [id number]  PCP:  Miguel AschoffWilliams, Julie Anne, MD   [facility]  Cardiologist:  No primary care provider on file.  Advanced Practice Provider:  No care team member to display Electrophysiologist:  None    Referring MD: Miguel AschoffWilliams, Julie Anne, MD    History of Present Illness:    Abigail SitesLizzie Harrison [name] is a 80 y.o. female with a hx of HTN, atrial flutter, and GERD who was previously followed by Norma FredricksonLori Gerhardt who now presents to clinic for follow-up.  Patient was found to be in atrial flutter when she presented for a surgical procedure at [facility] back on 04/13/2014.She had had a previous EKG on 03/16/14 which showed that she was in normal sinus rhythm at that time. She was cleared for the surgery and went on to have her surgical procedure without incident.   Has been followed by [name] with last visit 10/28/19 where she was doing well.  The patient states that she feels well. No chest pain or shortness of breath. Taking apixaban and tolerating well. Occasional palpitations when she lays down at night but these are not overly bothersome.  Blood pressure well controlled at home. Has occasional LE edema.  Has been struggling with the situation in [place] and feels bad for the people over there.  Past Medical History:  Diagnosis Date  . Atrial flutter (HCC) 05/07/2014   With controlled ventricular rate on no AV nodal agents.  . Chronic rheumatic heart disease 05/05/2014  . Constipation    takes Dulcolax as needed  . 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 Essential hypertension    takes Amlodipine daily  . GERD (gastroesophageal reflux disease)    takes Pantoprazole daily  . History of blood transfusion    no abnormal reaction noted  . History of migraine    last one a week   . History of shingles   . Hypertensive retinopathy of both eyes 03/07/2016   Moderate  . Mitral valve regurgitation, rheumatic 05/05/2014   Moderate to severe with peak PA pressure of 56 mmHg on Echo 04/2014   . Muscle spasm of both lower legs    takes Zanaflex daily as needed  . Osteoarthritis    knees and hands  . Peripheral edema    takes Lasix daily as needed   . PONV (postoperative nausea and vomiting)    with recent dilatation of throat, had N/V  . Systolic murmur    per patient report she has had rheumatic fever in childhood  . Thoracic aortic atherosclerosis (HCC) 10/08/2019   Radiographic on CT chest 2020    Past Surgical History:  Procedure Laterality Date  . ABDOMINAL HYSTERECTOMY     Fibroids  . COLONOSCOPY W/ BIOPSIES AND POLYPECTOMY    . CYST EXCISION Right    on hand  . ESOPHAGOSCOPY WITH DILITATION N/A 04/14/2014   Procedure: RIGID ESOPHAGOSCOPY WITH DILITATION AND BOTOX  INJECTION;  Surgeon: Osborn Cohoavid Shoemaker, MD;  [facility];  Service: ENT;  Laterality: N/A;  . MULTIPLE TOOTH EXTRACTIONS    . TOTAL KNEE ARTHROPLASTY Right 04/06/2015  . TOTAL KNEE ARTHROPLASTY Right 04/06/2015   Procedure: RIGHT TOTAL KNEE ARTHROPLASTY;  Surgeon: Marcene CorningPeter Dalldorf, MD;  Location: [facility] OR;  Service: Orthopedics;  Laterality: Right;  . TOTAL KNEE ARTHROPLASTY Left 09/14/2015   Procedure: TOTAL  KNEE ARTHROPLASTY LEFT;  Surgeon: Marcene Corning, MD;  Location: [facility] OR;  Service: Orthopedics;  Laterality: Left;    Current Medications: Current Meds  Medication Sig  . alendronate (FOSAMAX) 70 MG tablet Take 1 tablet (70 mg total) by mouth once a week. Take with a full glass of water on an empty stomach.  [name] amLODipine (NORVASC) 10 MG tablet Take 1 tablet (10 mg total) by mouth daily.  [name] apixaban (ELIQUIS) 5 MG TABS tablet Take 1 tablet (5 mg total) by mouth 2 (two) times daily.  [name] augmented betamethasone dipropionate (DIPROLENE-AF) 0.05 % ointment Apply topically daily.  . Cholecalciferol  (VITAMIN D3) 50 MCG (2000 UT) TABS Take 2,000 Units by mouth daily.  [name] erythromycin ophthalmic ointment Place a 1/2 inch ribbon of ointment into the lower eyelid 4 times a day until improved  . fluticasone (FLONASE) 50 MCG/ACT nasal spray SHAKE LIQUID AND USE 1 SPRAY IN EACH NOSTRIL DAILY  . hydrochlorothiazide (HYDRODIURIL) 25 MG tablet Take 1 tablet (25 mg total) by mouth daily.  . potassium chloride SA (KLOR-CON) 20 MEQ tablet Take 1 tablet (20 mEq total) by mouth daily.     Allergies:   Ace inhibitors   Social History   Socioeconomic History  . Marital status: Married    Spouse name: Not on file  . Number of children: Not on file  . Years of education: Not on file  . Highest education level: Not on file  Occupational History  . Not on file  Tobacco Use  . Smoking status: Never Smoker  . Smokeless tobacco: Never Used  Vaping Use  . Vaping Use: Never used  Substance and Sexual Activity  . Alcohol use: No  . Drug use: No  . Sexual activity: Not Currently  Other Topics Concern  . Not on file  Social History Narrative   Current Social History 03/02/2020        Patient lives with spouse in a/an home / condo / townhome which is 1 story/stories. There are not steps up to the entrance the patient uses.       Patient's method of transportation is personal car.      The highest level of education was junior high / middle school.      The patient currently retired.      Identified important Relationships are God, husband, kids       Pets : none       Interests / Fun: "Walking into stores and looking around.  Riding around"       Current Stressors: "Bills, the war"       Religious / Personal Beliefs:        Other:     Social Determinants of Corporate investment banker Strain: Not on file  Food Insecurity: Not on file  Transportation Needs: Not on file  Physical Activity: Not on file  Stress: Not on file  Social Connections: Not on file     Family History: The  patient's family history includes Asthma in her sister; Cancer in her brother, brother, and mother; Healthy in her daughter, daughter, daughter, and son; Heart attack in her sister; Other in her brother, brother, brother, brother, brother, and sister; Stroke in her father; Unexplained death in her sister.  ROS:   Please see the history of present illness.    Review of Systems  Constitutional: Negative for chills and fever.  HENT: Negative for hearing loss and sore throat.   Eyes: Negative for blurred  vision and redness.  Respiratory: Negative for cough and shortness of breath.   Cardiovascular: Positive for palpitations and leg swelling. Negative for chest pain, orthopnea, claudication and PND.  Gastrointestinal: Negative for melena, nausea and vomiting.  Genitourinary: Negative for dysuria and flank pain.  Musculoskeletal: Negative for myalgias.  Neurological: Negative for dizziness and loss of consciousness.  Endo/Heme/Allergies: Negative for polydipsia.  Psychiatric/Behavioral: Negative for substance abuse.    EKGs/Labs/Other Studies Reviewed:    The following studies were reviewed today: TTE 03-27-2017: Study Conclusions  - Left ventricle: The cavity size was normal. There was mild  concentric hypertrophy. Systolic function was normal. The  estimated ejection fraction was in the range of 60% to 65%. Wall  motion was normal; there were no regional wall motion  abnormalities.  - Aortic valve: Right coronary cusp mobility was moderately  restricted. There was very mild stenosis. Valve area (VTI): 2.12  cm^2. Valve area (Vmax): 1.62 cm^2. Valve area (Vmean): 1.85  cm^2.  - Mitral valve: There was mild to moderate regurgitation.  - Left atrium: The atrium was moderately dilated.  - Right atrium: The atrium was moderately dilated.   EKG:  EKG is  ordered today.  The ekg ordered today demonstrates NSR with HR 60  Recent Labs: 04/23/2019: ALT 22 05/28/2019: TSH  1.370 11/06/2019: Hemoglobin 15.4; Platelets 201 02/19/2020: BUN 7; Creatinine, Ser 0.62; Potassium 4.1; Sodium 139  Recent Lipid Panel    Component Value Date/Time   CHOL 160 04/23/2019 1520   TRIG 113 04/23/2019 1520   HDL 44 04/23/2019 1520   CHOLHDL 3.6 04/23/2019 1520   CHOLHDL 2.8 12/13/2015 1220   VLDL 16 12/13/2015 1220   LDLCALC 95 04/23/2019 1520     Risk Assessment/Calculations:    CHA2DS2-VASc Score = 5  {This indicates a 7.2% annual risk of stroke. The patient's score is based upon: CHF History: No HTN History: Yes Diabetes History: No Stroke History: No Vascular Disease History: Yes Age Score: 2 Gender Score: 1    Physical Exam:    VS:  BP 136/80 (BP Location: Left Arm, Patient Position: Sitting, Cuff Size: Normal)   Pulse 62   Ht 5\' 4"  (1.626 m)   Wt 207 lb (93.9 kg)   SpO2 97%   BMI 35.53 kg/m     Wt Readings from Last 3 Encounters:  03/26/20 207 lb (93.9 kg)  02/19/20 208 lb 9.6 oz (94.6 kg)  11/06/19 205 lb (93 kg)     GEN:  Well nourished, well developed in no acute distress HEENT: Normal NECK: No JVD; No carotid bruits CARDIAC: RRR, 2/6 systolic murmur. No rubs, gallops RESPIRATORY:  Clear to auscultation without rales, wheezing or rhonchi  ABDOMEN: Soft, non-tender, non-distended MUSCULOSKELETAL:  Trace LE edema SKIN: Warm and dry NEUROLOGIC:  Alert and oriented x 3 PSYCHIATRIC:  Normal affect   ASSESSMENT:    1. Persistent atrial fibrillation (HCC)   2. Rheumatic mitral regurgitation   3. Chronic anticoagulation   4. Essential hypertension    PLAN:    In order of problems listed above:  #Persistent Afib: CHADs-vasc 5. Doing well without palpitations. Tolerating eliquis. Not on nodal agents. -Continue apixaban 5mg  BID -HR controlled not on nodal agents  #HTN: Controlled at home. -Continue amlodipine 10mg  daily -Continue HCTZ 25mg  daily  #Rheumatic heart disease: #Mild AS #Mild-to-Moderate MR: TTE in 2019 with very  mild AS and mild-to-moderate MR. Doing well and asymptomatic. -Check TTE for serial monitoring  #Coronary calcification on CT chest: No anginal  symptoms. -No ASA given need for apixaban -LDL 90; consider statin in future      Medication Adjustments/Labs and Tests Ordered: Current medicines are reviewed at length with the patient today.  Concerns regarding medicines are outlined above.  Orders Placed This Encounter  Procedures  . ECHOCARDIOGRAM COMPLETE   No orders of the defined types were placed in this encounter.   Patient Instructions  Medication Instructions:  Your physician recommends that you continue on your current medications as directed. Please refer to the Current Medication list given to you today.   *If you need a refill on your cardiac medications before your next appointment, please call your pharmacy*   Lab Work: none If you have labs (blood work) drawn today and your tests are completely normal, you will receive your results only by: [name] MyChart Message (if you have MyChart) OR . A paper copy in the mail If you have any lab test that is abnormal or we need to change your treatment, we will call you to review the results.   Testing/Procedures: Your physician has requested that you have an echocardiogram. Echocardiography is a painless test that uses sound waves to create images of your heart. It provides your doctor with information about the size and shape of your heart and how well your heart's chambers and valves are working. This procedure takes approximately one hour. There are no restrictions for this procedure.     Follow-Up: At [facility], you and your health needs are our priority.  As part of our continuing mission to provide you with exceptional heart care, we have created designated Provider Care Teams.  These Care Teams include your primary Cardiologist (physician) and Advanced Practice Providers (APPs -  Physician Assistants and Nurse  Practitioners) who all work together to provide you with the care you need, when you need it.  We recommend signing up for the patient portal called "MyChart".  Sign up information is provided on this After Visit Summary.  MyChart is used to connect with patients for Virtual Visits (Telemedicine).  Patients are able to view lab/test results, encounter notes, upcoming appointments, etc.  Non-urgent messages can be sent to your provider as well.   To learn more about what you can do with MyChart, go to [email or web address].    Your next appointment:   6 month(s)  The format for your next appointment:   In Person  Provider:   Laurance Flatten, MD   Other Instructions      Signed, Meriam Sprague, MD  03/26/2020 4:01 PM    [facility]

## 2020-03-26 ENCOUNTER — Ambulatory Visit: Payer: Medicare Other | Admitting: Cardiology

## 2020-03-26 ENCOUNTER — Encounter: Payer: Self-pay | Admitting: Cardiology

## 2020-03-26 ENCOUNTER — Other Ambulatory Visit: Payer: Self-pay

## 2020-03-26 VITALS — BP 136/80 | HR 62 | Ht 64.0 in | Wt 207.0 lb

## 2020-03-26 DIAGNOSIS — I051 Rheumatic mitral insufficiency: Secondary | ICD-10-CM

## 2020-03-26 DIAGNOSIS — Z7901 Long term (current) use of anticoagulants: Secondary | ICD-10-CM | POA: Diagnosis not present

## 2020-03-26 DIAGNOSIS — I10 Essential (primary) hypertension: Secondary | ICD-10-CM | POA: Diagnosis not present

## 2020-03-26 DIAGNOSIS — I4819 Other persistent atrial fibrillation: Secondary | ICD-10-CM

## 2020-03-26 NOTE — Patient Instructions (Signed)
Medication Instructions:  Your physician recommends that you continue on your current medications as directed. Please refer to the Current Medication list given to you today.   *If you need a refill on your cardiac medications before your next appointment, please call your pharmacy*   Lab Work: none If you have labs (blood work) drawn today and your tests are completely normal, you will receive your results only by: [name] MyChart Message (if you have MyChart) OR . A paper copy in the mail If you have any lab test that is abnormal or we need to change your treatment, we will call you to review the results.   Testing/Procedures: Your physician has requested that you have an echocardiogram. Echocardiography is a painless test that uses sound waves to create images of your heart. It provides your doctor with information about the size and shape of your heart and how well your heart's chambers and valves are working. This procedure takes approximately one hour. There are no restrictions for this procedure.     Follow-Up: At [facility], you and your health needs are our priority.  As part of our continuing mission to provide you with exceptional heart care, we have created designated Provider Care Teams.  These Care Teams include your primary Cardiologist (physician) and Advanced Practice Providers (APPs -  Physician Assistants and Nurse Practitioners) who all work together to provide you with the care you need, when you need it.  We recommend signing up for the patient portal called "MyChart".  Sign up information is provided on this After Visit Summary.  MyChart is used to connect with patients for Virtual Visits (Telemedicine).  Patients are able to view lab/test results, encounter notes, upcoming appointments, etc.  Non-urgent messages can be sent to your provider as well.   To learn more about what you can do with MyChart, go to [email or web address].    Your next appointment:   6  month(s)  The format for your next appointment:   In Person  Provider:   Laurance Flatten, MD   Other Instructions

## 2020-04-01 ENCOUNTER — Other Ambulatory Visit: Payer: Self-pay | Admitting: *Deleted

## 2020-04-01 ENCOUNTER — Telehealth: Payer: Self-pay

## 2020-04-01 DIAGNOSIS — I10 Essential (primary) hypertension: Secondary | ICD-10-CM

## 2020-04-01 MED ORDER — POTASSIUM CHLORIDE CRYS ER 20 MEQ PO TBCR: 20.0000 meq | EXTENDED_RELEASE_TABLET | Freq: Every day | ORAL | 3 refills | Status: DC

## 2020-04-01 NOTE — Telephone Encounter (Signed)
Pt is requesting her potassium chloride SA (KLOR-CON) 20 MEQ tablet sent to  [id number] [name], [place] - [address] AT [facility] [address] Phone:  [phone]  Fax:  [phone]     ( pt stated that she has been been out for a week now )

## 2020-04-02 ENCOUNTER — Telehealth: Payer: Self-pay

## 2020-04-02 NOTE — Telephone Encounter (Signed)
Returned call to pt- Informed her rx for potassium was sent in yesterday No further action needed, phone call complete.Kingsley Spittle Cassady4/1/20229:22 AM

## 2020-04-02 NOTE — Telephone Encounter (Signed)
Refill sent in yesterday Called pt-no answer, message left on recorder.Criss Alvine, Puanani Gene Cassady4/1/20229:10 AM

## 2020-04-02 NOTE — Telephone Encounter (Signed)
Pt missed a called [phone]

## 2020-04-16 ENCOUNTER — Ambulatory Visit
Admission: RE | Admit: 2020-04-16 | Discharge: 2020-04-16 | Disposition: A | Discharge status: Home / Self Care | Payer: Medicare Other | Source: Ambulatory Visit | Attending: Internal Medicine | Admitting: Internal Medicine

## 2020-04-16 ENCOUNTER — Other Ambulatory Visit: Payer: Self-pay

## 2020-04-16 DIAGNOSIS — Z0000 Encounter for general adult medical examination without abnormal findings: Secondary | ICD-10-CM

## 2020-04-16 DIAGNOSIS — Z1231 Encounter for screening mammogram for malignant neoplasm of breast: Secondary | ICD-10-CM | POA: Diagnosis not present

## 2020-04-26 ENCOUNTER — Other Ambulatory Visit: Payer: Self-pay

## 2020-04-26 ENCOUNTER — Ambulatory Visit (HOSPITAL_COMMUNITY): Payer: Medicare Other | Attending: Cardiology

## 2020-04-26 DIAGNOSIS — I051 Rheumatic mitral insufficiency: Secondary | ICD-10-CM | POA: Diagnosis not present

## 2020-04-26 LAB — ECHOCARDIOGRAM COMPLETE
AR max vel: 1.54 cm2
AV Area VTI: 1.62 cm2
AV Area mean vel: 1.56 cm2
AV Mean grad: 12.8 mmHg
AV Peak grad: 24 mmHg
Ao pk vel: 2.45 m/s
Area-P 1/2: 2.78 cm2
S' Lateral: 2.8 cm

## 2020-04-27 ENCOUNTER — Telehealth: Payer: Self-pay | Admitting: *Deleted

## 2020-04-27 ENCOUNTER — Encounter: Payer: Self-pay | Admitting: Cardiology

## 2020-04-27 DIAGNOSIS — I350 Nonrheumatic aortic (valve) stenosis: Secondary | ICD-10-CM | POA: Insufficient documentation

## 2020-04-27 DIAGNOSIS — I051 Rheumatic mitral insufficiency: Secondary | ICD-10-CM

## 2020-04-27 DIAGNOSIS — I10 Essential (primary) hypertension: Secondary | ICD-10-CM

## 2020-04-27 DIAGNOSIS — I099 Rheumatic heart disease, unspecified: Secondary | ICD-10-CM

## 2020-04-27 NOTE — Telephone Encounter (Signed)
-----   Message from Quintella Reichert, MD sent at 04/27/2020 11:32 AM EDT ----- Echo showed normal LVF with severe BSH, severely enlarged atria, and mild to moderate AS.  Trivial MR.  Repeat echo in 1 year for AS

## 2020-04-27 NOTE — Telephone Encounter (Signed)
The patient has been notified of the result and verbalized understanding.  All questions (if any) were answered. [name], LPN 0/97/3532 9:92 PM   Order for repeat echo to be done in one year placed, and the pt is aware that our Echo scheduler will call her to arrange this test for that time. Pt verbalized understanding and agrees with this plan.

## 2020-04-28 DIAGNOSIS — H26492 Other secondary cataract, left eye: Secondary | ICD-10-CM | POA: Diagnosis not present

## 2020-04-28 DIAGNOSIS — H40013 Open angle with borderline findings, low risk, bilateral: Secondary | ICD-10-CM | POA: Diagnosis not present

## 2020-04-28 DIAGNOSIS — H04123 Dry eye syndrome of bilateral lacrimal glands: Secondary | ICD-10-CM | POA: Diagnosis not present

## 2020-04-28 DIAGNOSIS — H02403 Unspecified ptosis of bilateral eyelids: Secondary | ICD-10-CM | POA: Diagnosis not present

## 2020-04-28 DIAGNOSIS — H26493 Other secondary cataract, bilateral: Secondary | ICD-10-CM | POA: Diagnosis not present

## 2020-05-13 DIAGNOSIS — H26491 Other secondary cataract, right eye: Secondary | ICD-10-CM | POA: Diagnosis not present

## 2020-07-06 ENCOUNTER — Encounter: Payer: Self-pay | Admitting: *Deleted

## 2020-08-19 ENCOUNTER — Ambulatory Visit (INDEPENDENT_AMBULATORY_CARE_PROVIDER_SITE_OTHER): Payer: Medicare Other | Admitting: Internal Medicine

## 2020-08-19 VITALS — BP 142/95 | HR 55 | Temp 97.6°F | Ht 64.0 in | Wt 209.2 lb

## 2020-08-19 DIAGNOSIS — M810 Age-related osteoporosis without current pathological fracture: Secondary | ICD-10-CM

## 2020-08-19 DIAGNOSIS — I051 Rheumatic mitral insufficiency: Secondary | ICD-10-CM

## 2020-08-19 DIAGNOSIS — I10 Essential (primary) hypertension: Secondary | ICD-10-CM | POA: Diagnosis not present

## 2020-08-19 DIAGNOSIS — R059 Cough, unspecified: Secondary | ICD-10-CM

## 2020-08-19 DIAGNOSIS — R053 Chronic cough: Secondary | ICD-10-CM

## 2020-08-19 DIAGNOSIS — K5909 Other constipation: Secondary | ICD-10-CM

## 2020-08-19 DIAGNOSIS — N904 Leukoplakia of vulva: Secondary | ICD-10-CM

## 2020-08-19 DIAGNOSIS — I4892 Unspecified atrial flutter: Secondary | ICD-10-CM

## 2020-08-19 MED ORDER — SENNA 8.6 MG PO TABS: 1.0000 | ORAL_TABLET | Freq: Two times a day (BID) | ORAL | 3 refills | Status: DC | PRN

## 2020-08-19 MED ORDER — FLUTICASONE PROPIONATE 50 MCG/ACT NA SUSP: NASAL | 5 refills | Status: DC

## 2020-08-19 NOTE — Assessment & Plan Note (Signed)
"  it's bad" - my daughter told me to get a "colon cleanser".  I've had this all my life.  If I don't take something, I'll use a suppository or enema.  Takes something OTC Doesn't drink anough water, prescribe SennaS.

## 2020-08-19 NOTE — Progress Notes (Signed)
Routine f/u for 80 [name], who reports that she has been doing quite well.    Since last visit here she has visit with cardiologist Dr. Shari Prows 03/26/20 and underwent annual echocardiogram 04/2020, which showed mild-mod AS and trivial MR, nml EF though severe basal segment hypertrophy.  REmains asymptomatic, advised to repeat test in one year.  Ophthalmology visit with Dr. Elmer Picker 04/27/20.    She has been experiencing a nighttime cough, wants to know if anything cheaper than delsym- yes, delsym is long acting and is thus more expensive - a short acting formulation is available.  Cough is mostly nighttime, but also coughing this morning.  Coughing up yellowish phlegm especially in the morning.  Having PND.  No shortness of breath, feels as though it is largely congestion and an irritating "tickle".  Notes drainage down the back of her throat.  No ST or hearing changes, no ear pain. No reflux.  In passing she indicated that she sometimes has problems swallowing.  Had "throat stretching done" (cricopharyngeal stretch and botox) which helped in the past, can't recall when (2016).  Feels that when her nose is stopped up, she might choke when she drinks water.  No problems swallowing food (no dysphagia, no odynophagia).  Needs rf for nasal spray. Thinks her cough might have developed after she ran out.  Having vulvar itching (chronic lichen sclerosis), topical effective, has adequate supply.     Medications reviewed: she is adherent as described.   BP (!) 142/95 (BP Location: Right Arm, Patient Position: Sitting, Cuff Size: Small)   Pulse (!) 55   Temp 97.6 F (36.4 C) (Oral)   Ht 5\' 4"  (1.626 m)   Wt 209 lb 3.2 oz (94.9 kg)   SpO2 99%   BMI 35.91 kg/m    Bright and well appearing, always positive.  Heart prominent systolic murmur over base (has AS).  Regular rhythm and rate  (hx of atrial flutter).  Lungs completely clear.  No JVD, no LE edema.  OP with scant drainage.  Nasal mucosa congested,  erythematous.  No purulence.  She demonstrates cough which is non productive and mildly wet sounding.  No distress associated.    Assessment and plan:  See also problem based documentation.  [name] is doing well, her most bothersome symptom being chronic cough.  After reviewing her hx, I wonder whether she may be experiencing a return of her cricopharyngeal narrowing.  I will discuss with her a repeat esophagram at next visit, earlier if symptoms worsen.  In the meantime she can continue to use combination antitussive/expectorant. She seems to have been in NSR for most documented visits.  Query necessity of lifelong anticoagulation, though she is tolerating this w/o adverse events at this time.  Her AS remains asymptomatic.  Recheck vit D..  She remains adherent to her bisphosphonate for osteoporosis.

## 2020-08-19 NOTE — Patient Instructions (Signed)
[name], it was wonderful to see you today  We discussed over the counter cough medicine.  You can find an less expensive version of Delsym at any pharmacy.  You will be looking for a medicine with these ingredients: 1) dextramethorphan (this helps suppress the cough) 2)  guaiafenesin (this helps loosen chest congestion).  Most cough syrups have these ingredients.  Ask someone at the pharmacy if you'd like assistance finding this.  We discussed post-nasal drainage from your nose congestion causing the cough.  I have refilled your nose spray.  Today we are rechecking your vitamin D level to see if your supplement has been effective.  Let's get together in another 3 months or so.  Take care and stay well!  Dr. Mayford Knife

## 2020-08-20 LAB — VITAMIN D 25 HYDROXY (VIT D DEFICIENCY, FRACTURES): Vit D, 25-Hydroxy: 24.3 ng/mL — ABNORMAL LOW (ref 30.0–100.0)

## 2020-08-23 ENCOUNTER — Telehealth: Payer: Self-pay | Admitting: Internal Medicine

## 2020-08-23 DIAGNOSIS — M810 Age-related osteoporosis without current pathological fracture: Secondary | ICD-10-CM

## 2020-08-23 MED ORDER — VITAMIN D3 1.25 MG (50000 UT) PO TABS
50000.0000 [IU] | ORAL_TABLET | ORAL | 0 refills | Status: DC
Start: 2020-08-23 — End: 2020-09-14

## 2020-08-23 NOTE — Telephone Encounter (Signed)
Called Mrs. Khawaja to inform her of her persistenly suboptimal Vit D level.  She agrees to take high dose tab weekly x 4 (her husband will take similarly; this will help avoid confusion between the two of them).

## 2020-08-30 ENCOUNTER — Encounter: Payer: Self-pay | Admitting: Internal Medicine

## 2020-08-30 NOTE — Assessment & Plan Note (Signed)
Auscultates regular rhythm.  Remains anticoagulated with no adverse events. 

## 2020-08-30 NOTE — Assessment & Plan Note (Addendum)
Cough continues, worse when supine, feels drainage down throat.  Notes difficulty breathing and swallowing liquid (feels like she will choke). Recalls similar symptoms years ago, treated with cricopharyngeal dilatation and Botox by ENT.  After reviewing her hx, I wonder whether she may be experiencing a return of her cricopharyngeal narrowing.  I will discuss with her a repeat esophagram at next visit, earlier if symptoms worsen.  In the meantime she can continue to use combination antitussive/expectorant.  PND may be contributing, RF nasal steroid.

## 2020-08-30 NOTE — Assessment & Plan Note (Signed)
Remains annoyingly symptomatic.  SHe is dependent on topical steroid for symptom control.

## 2020-08-30 NOTE — Assessment & Plan Note (Signed)
SBPs 130s-140s, hesitant to add another medicine.  SHe is doing well on current meds.

## 2020-08-30 NOTE — Assessment & Plan Note (Signed)
Trivial on echo 04/2020. 

## 2020-08-30 NOTE — Assessment & Plan Note (Signed)
Recheck vit D. Adherent to bisphosphonate, tolerated well.

## 2020-09-20 ENCOUNTER — Other Ambulatory Visit: Payer: Self-pay

## 2020-09-20 DIAGNOSIS — I10 Essential (primary) hypertension: Secondary | ICD-10-CM

## 2020-09-20 DIAGNOSIS — I483 Typical atrial flutter: Secondary | ICD-10-CM

## 2020-09-20 MED ORDER — AMLODIPINE BESYLATE 10 MG PO TABS
10.0000 mg | ORAL_TABLET | Freq: Every day | ORAL | 3 refills | Status: DC
Start: 2020-09-20 — End: 2021-08-31

## 2020-09-20 MED ORDER — APIXABAN 5 MG PO TABS
5.0000 mg | ORAL_TABLET | Freq: Two times a day (BID) | ORAL | 3 refills | Status: DC
Start: 2020-09-20 — End: 2021-08-31

## 2020-09-20 MED ORDER — HYDROCHLOROTHIAZIDE 25 MG PO TABS: 25.0000 mg | ORAL_TABLET | Freq: Every day | ORAL | 3 refills | Status: DC

## 2020-11-18 ENCOUNTER — Ambulatory Visit (INDEPENDENT_AMBULATORY_CARE_PROVIDER_SITE_OTHER): Payer: Medicare Other | Admitting: Internal Medicine

## 2020-11-18 VITALS — BP 136/68 | HR 65 | Temp 98.4°F | Ht 64.0 in | Wt 207.6 lb

## 2020-11-18 DIAGNOSIS — I10 Essential (primary) hypertension: Secondary | ICD-10-CM | POA: Diagnosis not present

## 2020-11-18 DIAGNOSIS — I350 Nonrheumatic aortic (valve) stenosis: Secondary | ICD-10-CM

## 2020-11-18 DIAGNOSIS — Z6379 Other stressful life events affecting family and household: Secondary | ICD-10-CM | POA: Diagnosis not present

## 2020-11-18 DIAGNOSIS — Z23 Encounter for immunization: Secondary | ICD-10-CM | POA: Diagnosis not present

## 2020-11-18 DIAGNOSIS — K220 Achalasia of cardia: Secondary | ICD-10-CM | POA: Diagnosis not present

## 2020-11-18 DIAGNOSIS — I4892 Unspecified atrial flutter: Secondary | ICD-10-CM | POA: Diagnosis not present

## 2020-11-18 DIAGNOSIS — M810 Age-related osteoporosis without current pathological fracture: Secondary | ICD-10-CM | POA: Diagnosis not present

## 2020-11-18 MED ORDER — ALENDRONATE SODIUM 70 MG PO TABS: 70.0000 mg | ORAL_TABLET | ORAL | 3 refills | Status: DC

## 2020-11-18 NOTE — Assessment & Plan Note (Signed)
Auscultates regular rhythm.  Remains anticoagulated with no adverse events.
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
## 2020-11-18 NOTE — Patient Instructions (Signed)
Ms. [name], I appreciated the opportunity to visit with you today about the difficulty situation regarding [name], not knowing what will happen once he finishes therapy at [facility].  You would find it helpful to talk to our therapist Dr. Monna Fam about this stress.  I will make a referral and she will reach out to her.  There is support for you!   Meanwhile, I will reach out to the doctors at [facility] to find out where things stand.  Let's get your throat and voice checked out again.    Flu shot today!  Take care and stay well.  Dr. Mayford Knife

## 2020-11-18 NOTE — Assessment & Plan Note (Addendum)
136/68.  Anxious and distraught today about her husband's poor health.  No changes at this time.

## 2020-11-18 NOTE — Assessment & Plan Note (Signed)
Symptoms have recurred and she desires reevaluation.  I will explore her former history in 2016 and proceed accordingly.

## 2020-11-18 NOTE — Assessment & Plan Note (Signed)
Asymptomatic.  No orthostasis or chest pain, no shortness of breath.

## 2020-11-22 NOTE — Addendum Note (Signed)
Addended by: Charissa Bash A on: 11/22/2020 03:33 PM   Modules accepted: Orders

## 2020-11-30 ENCOUNTER — Encounter: Payer: Self-pay | Admitting: Internal Medicine

## 2020-12-09 DIAGNOSIS — H02403 Unspecified ptosis of bilateral eyelids: Secondary | ICD-10-CM | POA: Diagnosis not present

## 2020-12-09 DIAGNOSIS — H524 Presbyopia: Secondary | ICD-10-CM | POA: Diagnosis not present

## 2020-12-09 DIAGNOSIS — H02535 Eyelid retraction left lower eyelid: Secondary | ICD-10-CM | POA: Diagnosis not present

## 2020-12-09 DIAGNOSIS — H35033 Hypertensive retinopathy, bilateral: Secondary | ICD-10-CM | POA: Diagnosis not present

## 2020-12-09 DIAGNOSIS — H40013 Open angle with borderline findings, low risk, bilateral: Secondary | ICD-10-CM | POA: Diagnosis not present

## 2020-12-20 ENCOUNTER — Ambulatory Visit: Payer: Medicare Other | Admitting: Behavioral Health

## 2020-12-20 DIAGNOSIS — F331 Major depressive disorder, recurrent, moderate: Secondary | ICD-10-CM

## 2020-12-20 DIAGNOSIS — F419 Anxiety disorder, unspecified: Secondary | ICD-10-CM

## 2020-12-20 NOTE — BH Specialist Note (Signed)
Integrated Behavioral Health via Telemedicine Visit  12/20/2020 [name] [id number]  Number of Integrated Behavioral Health visits: 1/6 Session Start time: 10:00am  Session End time: 10:30am Total time: 30  Referring Provider: Dr. Wilfred Lacy, MD Patient/Family location: Pt is home in private [facility] Provider location: [facility] Office All persons participating in visit: Pt & Clinician Types of Service: Individual psychotherapy and Health Promotion  I connected with [name] and/or Charlaine Dalton Worden's  self  via  Telephone or Video Enabled Telemedicine Application  (Video is Caregility application) and verified that I am speaking with the correct person using two identifiers. Discussed confidentiality: Yes   I discussed the limitations of telemedicine and the availability of in person appointments.  Discussed there is a possibility of technology failure and discussed alternative modes of communication if that failure occurs.  I discussed that engaging in this telemedicine visit, they consent to the provision of behavioral healthcare and the services will be billed under their insurance.  Patient and/or legal guardian expressed understanding and consented to Telemedicine visit: Yes   Presenting Concerns: Patient and/or family reports the following symptoms/concerns: reduction in anx/dep due to the absence of Husb from the home. Pt has been less stressed since this time. Pt has been in Rehab @ [place] for over a month. PT did not help after 3-4 wks. Now, Pt is unsure if Husb will remain there or not. Pt does not do well when Husb is disrespectful to her on a daily basis. Pt speaks to Husb daily on the phone to encourage him. Duration of problem: years; Severity of problem: mild  Patient and/or Family's Strengths/Protective Factors: Social and Emotional competence, Concrete supports in place (healthy food, safe environments, etc.), and Physical Health (exercise, healthy  diet, medication compliance, etc.)  Goals Addressed: Patient will:  Reduce symptoms of: anxiety, depression, and stress   Increase knowledge and/or ability of: coping skills and stress reduction   Demonstrate ability to: Increase healthy adjustment to current life circumstances  Progress towards Goals: Estb'd today; Pt is doing "much better" since her Husb went to [facility].   Interventions: Interventions utilized:  Supportive Counseling Standardized Assessments completed:  screeners prn  Patient and/or Family Response: Pt receptive to call today. She is grateful for the call & requests future ck-in calls.  Assessment: Patient currently experiencing reduction in anx/dep & stress due to shifting the 24/7 Px care of her Husb to a Rehabilitation Facility in [place]. Pt is thinking better, praying, reading, & finding time to care for herself & the rest of her Family. Pt has 15 Grand Children & loves them all. Her Dtr Jaqueline lives 5 min away & her Albers Children call her to check on her often.  Pt recognizes now that her Husb needed specialty care & she can no longer lift him & care for his daily needs. Her L knee is still bothering her from knee replacement surgery, & she is glad she had both knees operated on due to pain.   Patient may benefit from cont'd calls to enhance mental health wellness.  Plan: Follow up with behavioral health clinician on : 2-4 wks on telehealth for 30 min Behavioral recommendations: Pt will enjoy her time & care for self in all ways healthy.  Referral(s): Integrated [employer] (In Clinic)  I discussed the assessment and treatment plan with the patient and/or parent/guardian. They were provided an opportunity to ask questions and all were answered. They agreed with the plan and demonstrated  an understanding of the instructions.   They were advised to call back or seek an in-person evaluation if the symptoms worsen or if the condition fails to  improve as anticipated.  Deneise Lever, LMFT

## 2020-12-23 DIAGNOSIS — H02535 Eyelid retraction left lower eyelid: Secondary | ICD-10-CM | POA: Diagnosis not present

## 2020-12-23 DIAGNOSIS — H53483 Generalized contraction of visual field, bilateral: Secondary | ICD-10-CM | POA: Diagnosis not present

## 2020-12-23 DIAGNOSIS — H04123 Dry eye syndrome of bilateral lacrimal glands: Secondary | ICD-10-CM | POA: Diagnosis not present

## 2020-12-23 DIAGNOSIS — H02422 Myogenic ptosis of left eyelid: Secondary | ICD-10-CM | POA: Diagnosis not present

## 2020-12-29 DIAGNOSIS — H02422 Myogenic ptosis of left eyelid: Secondary | ICD-10-CM | POA: Diagnosis not present

## 2021-01-11 ENCOUNTER — Ambulatory Visit: Payer: Medicare Other | Admitting: Behavioral Health

## 2021-01-11 DIAGNOSIS — F419 Anxiety disorder, unspecified: Secondary | ICD-10-CM

## 2021-01-11 DIAGNOSIS — F331 Major depressive disorder, recurrent, moderate: Secondary | ICD-10-CM

## 2021-01-11 NOTE — BH Specialist Note (Signed)
Integrated Behavioral Health via Telemedicine Visit  01/11/2021 [name] [id number]  Number of Integrated Behavioral Health visits: 2/6 Session Start time: 2:30pm  Session End time: 3:00pm Total time: 30  Referring Provider: Dr. Wilfred Lacy, MD Patient/Family location: Pt is home in private [facility] Provider location: [facility] Office All persons participating in visit: Pt & Clinician Types of Service: Individual psychotherapy  I connected with [name] and/or [name]'s  self  via  Telephone or Video Enabled Telemedicine Application  (Video is Caregility application) and verified that I am speaking with the correct person using two identifiers. Discussed confidentiality:  2nd visit  I discussed the limitations of telemedicine and the availability of in person appointments.  Discussed there is a possibility of technology failure and discussed alternative modes of communication if that failure occurs.  I discussed that engaging in this telemedicine visit, they consent to the provision of behavioral healthcare and the services will be billed under their insurance.  Patient and/or legal guardian expressed understanding and consented to Telemedicine visit:  2nd visit  Presenting Concerns: Patient and/or family reports the following symptoms/concerns: reduced anx/dep & stress due to Husb's recent placement @ [facility] Duration of problem: years of stressful noise & turmoil w/Husb post CVA & high fall risk; Severity of problem: moderate trending mild  Patient and/or Family's Strengths/Protective Factors: Concrete supports in place (healthy food, safe environments, etc.) and Sense of purpose for this stage in life when she can be w/herself & enjoy her Family  Goals Addressed: Patient will:  Reduce symptoms of: anxiety and depression   Increase knowledge and/or ability of: coping skills   Demonstrate ability to: Increase healthy adjustment to current life  circumstances  Progress towards Goals: Ongoing  Interventions: Interventions utilized:  Supportive Counseling and Supportive Reflection Standardized Assessments completed:  screeners prn  Patient and/or Family Response: Pt receptive to call today & requests future appts.  Assessment: Patient currently experiencing dec in anx/dep due to full support of her children. Pt is also visiting her Husb in [facility] as she is able & limiting her calls to him daily.  Patient may benefit from cont'd ck-ins for mental health wellness.  Plan: Follow up with behavioral health clinician on : 2 wks on telehealth for 30 min Behavioral recommendations: Start doing puzzles; both picture & cross-words, seek & find word search Referral(s): Integrated [employer] (In Clinic)  I discussed the assessment and treatment plan with the patient and/or parent/guardian. They were provided an opportunity to ask questions and all were answered. They agreed with the plan and demonstrated an understanding of the instructions.   They were advised to call back or seek an in-person evaluation if the symptoms worsen or if the condition fails to improve as anticipated.  Abigail Lever, LMFT

## 2021-01-27 DIAGNOSIS — H53482 Generalized contraction of visual field, left eye: Secondary | ICD-10-CM | POA: Diagnosis not present

## 2021-01-27 DIAGNOSIS — H02422 Myogenic ptosis of left eyelid: Secondary | ICD-10-CM | POA: Diagnosis not present

## 2021-01-27 DIAGNOSIS — H04123 Dry eye syndrome of bilateral lacrimal glands: Secondary | ICD-10-CM | POA: Diagnosis not present

## 2021-01-27 DIAGNOSIS — H02535 Eyelid retraction left lower eyelid: Secondary | ICD-10-CM | POA: Diagnosis not present

## 2021-02-03 ENCOUNTER — Ambulatory Visit: Payer: Medicare Other | Admitting: Behavioral Health

## 2021-02-09 ENCOUNTER — Telehealth: Payer: Self-pay | Admitting: Behavioral Health

## 2021-02-09 NOTE — Telephone Encounter (Signed)
Unable to reach Pt today as phone cont'd to ring. Unable to lv msg for Pt to r/s so Clinician will place Pt on r/s list.  Dr. Monna Fam

## 2021-02-10 ENCOUNTER — Encounter: Payer: Self-pay | Admitting: Internal Medicine

## 2021-02-10 ENCOUNTER — Ambulatory Visit (INDEPENDENT_AMBULATORY_CARE_PROVIDER_SITE_OTHER): Payer: Medicare Other | Admitting: Internal Medicine

## 2021-02-10 VITALS — BP 145/89 | HR 72 | Temp 98.3°F | Ht 64.0 in | Wt 201.9 lb

## 2021-02-10 DIAGNOSIS — I10 Essential (primary) hypertension: Secondary | ICD-10-CM | POA: Diagnosis not present

## 2021-02-10 DIAGNOSIS — K5909 Other constipation: Secondary | ICD-10-CM

## 2021-02-10 DIAGNOSIS — R0982 Postnasal drip: Secondary | ICD-10-CM | POA: Diagnosis not present

## 2021-02-10 NOTE — Progress Notes (Signed)
[name] is here for routine f/u.  [name] is doing so much better, living alone.  [name] husband has been out of the house since 10/2020, continuing to live in [place], with no return home anticipated.  No longer tired and exhausted.  PHysically and emotionally feels better, with agency and peace.    Nasal congestion chronic  - requests Flonase refills.  Using too much 3x/d, instructed to use only once a day.    Sleeping ok, bowels and bladder ok, no pain, generally feels less down. No further difficulty with swallowing, for which I'd ordered further evaluation given [name] hx of throat stricture.  She doesn't feel that she needs to pursue at this time.   BP (!) 145/89 (BP Location: Right Arm, Patient Position: Sitting, Cuff Size: Small)    Pulse 72    Temp 98.3 F (36.8 C) (Oral)    Ht 5\' 4"  (1.626 m)    Wt 201 lb 14.4 oz (91.6 kg)    SpO2 100%    BMI 34.66 kg/m   Exam:  Nicely dressed and groomed, appears well. Bright affect and energetic demeanor. Throat with cobblestoning, tonsilliths R side.  Nasal passages smooth pink and wet. L eyelid droopy, chronic,.  Has seen eye doctor, thinking about blepharoplasty.  Assessment and plan (see also problem based charting): [name] is doing very well, largely due to [name] new independence as [name] husband has been living at [place] following a stroke - he is nonambulatory and she doesn't anticipate that he will be returning home, though she sometimes feels obligatory pressure and guilt that she is not able to provide his total care.  I'm not concerned with tighter blood pressure control and she is otherwise doing fine.  [name] chronic post nasal drainage can be treated with resumption of an OTC antihistamine and she can continue Flonase, though needs to use it only once daily as prescribed.  She understands.

## 2021-02-10 NOTE — Assessment & Plan Note (Signed)
Asymptomatic, though always constipated.

## 2021-02-10 NOTE — Assessment & Plan Note (Signed)
Asymptomatic, resolve?

## 2021-02-10 NOTE — Assessment & Plan Note (Addendum)
No longer feeling as though she was going to choke, less drainage, very occasional cough.  Flonase helps.

## 2021-02-10 NOTE — Assessment & Plan Note (Signed)
She declined the rigid esophagoscopy, feels her swallowing is ok for now.

## 2021-02-10 NOTE — Patient Instructions (Signed)
[name] to see you doing so well!  You seem a new person.  We are going to check your kidney filter function today and I'll call you with results.  For your congestion and drainage down back of your throat, try getting some CLARITIN over the counter, one daily.  If it doesn't help your breathing, you can stop it.  TAke your Flonase nasal spray just 1 spray in each nostril ONCE A DAY.   Take care and stay well!  See you in 3 months.  Dr. Mayford Knife

## 2021-02-12 LAB — BMP8+ANION GAP
Anion Gap: 19 mmol/L — ABNORMAL HIGH (ref 10.0–18.0)
BUN/Creatinine Ratio: 14 (ref 12–28)
BUN: 9 mg/dL (ref 8–27)
CO2: 23 mmol/L (ref 20–29)
Calcium: 10 mg/dL (ref 8.7–10.3)
Chloride: 100 mmol/L (ref 96–106)
Creatinine, Ser: 0.64 mg/dL (ref 0.57–1.00)
Glucose: 86 mg/dL (ref 70–99)
Potassium: 3.5 mmol/L (ref 3.5–5.2)
Sodium: 142 mmol/L (ref 134–144)
eGFR: 89 mL/min/{1.73_m2} (ref >59)

## 2021-02-16 DIAGNOSIS — H53482 Generalized contraction of visual field, left eye: Secondary | ICD-10-CM | POA: Diagnosis not present

## 2021-02-17 NOTE — Assessment & Plan Note (Signed)
Managed by an OTC medication.  SHe is not concerned.

## 2021-03-01 ENCOUNTER — Ambulatory Visit: Payer: Medicare Other | Admitting: Behavioral Health

## 2021-03-01 DIAGNOSIS — F419 Anxiety disorder, unspecified: Secondary | ICD-10-CM

## 2021-03-01 DIAGNOSIS — F331 Major depressive disorder, recurrent, moderate: Secondary | ICD-10-CM

## 2021-03-01 NOTE — BH Specialist Note (Signed)
Integrated Behavioral Health via Telemedicine Visit  03/01/2021 [name] [id number]  Number of Integrated Behavioral Health Clinician visits: 3/6 Session Start time: 2:30pm Session End time: 3:00pm Total time in minutes:30 min   Referring Provider: Dr. Lenise Herald, MD Patient/Family location: Pt is home in private [facility] Provider location: Working remotely All persons participating in visit: Pt & Clinician Types of Service: Individual psychotherapy  I connected with [name] and/or [name]'s  self  via  Telephone or Video Enabled Telemedicine Application  (Video is Caregility application) and verified that I am speaking with the correct person using two identifiers. Discussed confidentiality: Yes   I discussed the limitations of telemedicine and the availability of in person appointments.  Discussed there is a possibility of technology failure and discussed alternative modes of communication if that failure occurs.  I discussed that engaging in this telemedicine visit, they consent to the provision of behavioral healthcare and the services will be billed under their insurance.  Patient and/or legal guardian expressed understanding and consented to Telemedicine visit: Yes   Presenting Concerns: Patient and/or family reports the following symptoms/concerns: reduction in anx/dep due to speaking her mind & caring for controlling Husb @ a distance Duration of problem: months; Severity of problem: mild  Patient and/or Family's Strengths/Protective Factors: Social and Emotional competence, Concrete supports in place (healthy food, safe environments, etc.), and Sense of purpose  Goals Addressed: Patient will:  Reduce symptoms of: anxiety, depression, and obsessions   Increase knowledge and/or ability of: coping skills   Demonstrate ability to: Increase healthy adjustment to current life circumstances  Progress towards  Goals: Ongoing  Interventions: Interventions utilized:  Solution-Focused Strategies and Supportive Counseling Standardized Assessments completed:  screeners prn  Patient and/or Family Response: Pt receptive to call today & requests future appt  Assessment: Patient currently experiencing reduction in anx/dep & stress due to improved boundaries w/Husb who is in [facility].   Patient may benefit from cont'd ck-in sessions for mental health wellness.  Plan: Follow up with behavioral health clinician on : 2-3 wks for 30 min on tele Behavioral recommendations: Cont to stick up for yourself & set healthy boundaries w/Husb Referral(s): [place] (In Clinic)  I discussed the assessment and treatment plan with the patient and/or parent/guardian. They were provided an opportunity to ask questions and all were answered. They agreed with the plan and demonstrated an understanding of the instructions.   They were advised to call back or seek an in-person evaluation if the symptoms worsen or if the condition fails to improve as anticipated.  Donnetta Hutching, LMFT

## 2021-03-10 ENCOUNTER — Telehealth: Payer: Self-pay | Admitting: Cardiology

## 2021-03-10 ENCOUNTER — Telehealth: Payer: Self-pay | Admitting: *Deleted

## 2021-03-10 NOTE — Telephone Encounter (Signed)
Pt agreeable to plan of care for tele appt for pre op clearance with Bettina Gavia, [facility] 03/11/21 @ 9 am. Med rec and consent done. Though pt was not sure about if she is taking HCTZ, though does confirm K+. I assured the pt that I will call her in the AM as she requested before [facility] calls her, to make sure meds are correct.  ?

## 2021-03-10 NOTE — Telephone Encounter (Signed)
Primary Cardiologist:None ? ?Chart reviewed as part of pre-operative protocol coverage. Because of [name]'s past medical history and time since last visit, [name] will require a follow-up visit in order to better assess preoperative cardiovascular risk. ? ?Pre-op covering staff: ?- Please schedule appointment and call patient to inform them. ?- Please contact requesting surgeon's office via preferred method (i.e, phone, fax) to inform them of need for appointment prior to surgery. ? ?This message will also be routed to pharmacy pool for input on holding anticoagulant agent as requested below so that this information is available at time of patient's appointment.  ? ?Abigail Aland, NP-C ? ?  ?03/10/2021, 11:31 AM ?[facility] ?[address], [address] ?Office [phone] Fax [phone] ? ?

## 2021-03-10 NOTE — Telephone Encounter (Signed)
?  Patient Consent for Virtual Visit  ? ? ?   ? ?[name] has provided verbal consent on 03/10/2021 for a virtual visit (video or telephone). ? ? ?CONSENT FOR VIRTUAL VISIT FOR:  [name]  ?By participating in this virtual visit I agree to the following: ? ?I hereby voluntarily request, consent and authorize [facility] and its employed or contracted physicians, physician assistants, nurse practitioners or other licensed health care professionals (the Practitioner), to provide me with telemedicine health care services (the ?Services") as deemed necessary by the treating Practitioner. I acknowledge and consent to receive the Services by the Practitioner via telemedicine. I understand that the telemedicine visit will involve communicating with the Practitioner through live audiovisual communication technology and the disclosure of certain medical information by electronic transmission. I acknowledge that I have been given the opportunity to request an in-person assessment or other available alternative prior to the telemedicine visit and am voluntarily participating in the telemedicine visit. ? ?I understand that I have the right to withhold or withdraw my consent to the use of telemedicine in the course of my care at any time, without affecting my right to future care or treatment, and that the Practitioner or I may terminate the telemedicine visit at any time. I understand that I have the right to inspect all information obtained and/or recorded in the course of the telemedicine visit and may receive copies of available information for a reasonable fee.  I understand that some of the potential risks of receiving the Services via telemedicine include:  ?Delay or interruption in medical evaluation due to technological equipment failure or disruption; ?Information transmitted may not be sufficient (e.g. poor resolution of images) to allow for appropriate medical decision making by the  Practitioner; and/or  ?In rare instances, security protocols could fail, causing a breach of personal health information. ? ?Furthermore, I acknowledge that it is my responsibility to provide information about my medical history, conditions and care that is complete and accurate to the best of my ability. I acknowledge that Practitioner's advice, recommendations, and/or decision may be based on factors not within their control, such as incomplete or inaccurate data provided by me or distortions of diagnostic images or specimens that may result from electronic transmissions. I understand that the practice of medicine is not an exact science and that Practitioner makes no warranties or guarantees regarding treatment outcomes. I acknowledge that a copy of this consent can be made available to me via my patient portal [facility] MyChart), or I can request a printed copy by calling the office of [facility].   ? ?I understand that my insurance will be billed for this visit.  ? ?I have read or had this consent read to me. ?I understand the contents of this consent, which adequately explains the benefits and risks of the Services being provided via telemedicine.  ?I have been provided ample opportunity to ask questions regarding this consent and the Services and have had my questions answered to my satisfaction. ?I give my informed consent for the services to be provided through the use of telemedicine in my medical care ? ? ? ?

## 2021-03-10 NOTE — Telephone Encounter (Signed)
Recommendations have been received by pharmacy.  Patient has telephone visit with preoperative APP on 03/11/2021.  I will remove from preoperative pool. ? ?Jossie Ng. Trevonne Nyland NP-C ? ?  ?03/10/2021, 1:33 PM ?[facility] ?[address] ?Office [phone] Fax [phone] ? ?

## 2021-03-10 NOTE — Telephone Encounter (Signed)
Patient with diagnosis of aflutter on Eliquis for anticoagulation.   ? ?Procedure: left upper eyelid external ptosis repair ?Date of procedure: 03/23/21 ? ?CHA2DS2-VASc Score = 5  ?This indicates a 7.2% annual risk of stroke. ?The patient's score is based upon: ?CHF History: 0 ?HTN History: 1 ?Diabetes History: 0 ?Stroke History: 0 ?Vascular Disease History: 1 ?Age Score: 2 ?Gender Score: 1 ? ?CrCl 64mL/min using adjusted body weight due to obesity ?Platelet count 201K ? ?Per office protocol, patient can hold Eliquis for 1-2 days prior to procedure.   ?

## 2021-03-10 NOTE — Telephone Encounter (Signed)
? ? ?  Pre-operative Risk Assessment  ?  ?Patient Name: [name]  ?DOB: 23-Jan-1940 ?MRN: [id number]  ? ?  ? ?Request for Surgical Clearance   ? ?Procedure:   left upper eyelid external ptosis repair ? ?Date of Surgery:  Clearance 03/23/21                              ?   ?Surgeon:  Dr. Sabino Dick  ?Surgeon's Group or Practice Name:  [facility]  ?Phone number:  [phone] ?Fax number:  [phone] ?  ?Type of Clearance Requested:   ?- Medical  ?- Pharmacy:  Hold Apixaban (Eliquis) defer to cards ?  ?Type of Anesthesia:  MAC ?  ?Additional requests/questions:   ? ?Signed, ?Chena [place]   ?03/10/2021, 11:15 AM   ?

## 2021-03-11 ENCOUNTER — Other Ambulatory Visit: Payer: Self-pay

## 2021-03-11 ENCOUNTER — Ambulatory Visit (INDEPENDENT_AMBULATORY_CARE_PROVIDER_SITE_OTHER): Payer: Medicare Other | Admitting: Physician Assistant

## 2021-03-11 ENCOUNTER — Encounter: Payer: Self-pay | Admitting: Physician Assistant

## 2021-03-11 ENCOUNTER — Other Ambulatory Visit: Payer: Self-pay | Admitting: *Deleted

## 2021-03-11 DIAGNOSIS — Z01810 Encounter for preprocedural cardiovascular examination: Secondary | ICD-10-CM | POA: Diagnosis not present

## 2021-03-11 DIAGNOSIS — I10 Essential (primary) hypertension: Secondary | ICD-10-CM

## 2021-03-11 NOTE — Progress Notes (Signed)
Virtual Visit via Telephone Note   This visit type was conducted due to national recommendations for restrictions regarding the COVID-19 Pandemic (e.g. social distancing) in an effort to limit this patient's exposure and mitigate transmission in our community.  Due to her co-morbid illnesses, this patient is at least at moderate risk for complications without adequate follow up.  This format is felt to be most appropriate for this patient at this time.  The patient did not have access to video technology/had technical difficulties with video requiring transitioning to audio format only (telephone).  All issues noted in this document were discussed and addressed.  No physical exam could be performed with this format.  Please refer to the patient's chart for her  consent to telehealth for [facility]. Evaluation Performed:  Preoperative cardiovascular risk assessment  This visit type was conducted due to national recommendations for restrictions regarding the COVID-19 Pandemic (e.g. social distancing).  This format is felt to be most appropriate for this patient at this time.  All issues noted in this document were discussed and addressed.  No physical exam was performed (except for noted visual exam findings with Video Visits).  Please refer to the patient's chart (MyChart message for video visits and phone note for telephone visits) for the patient's consent to telehealth for [facility]. _____________   Date:  03/11/2021   Patient ID:  [name], DOB 1940-06-12, MRN [id number] Patient Location:  Home Provider location:   Office  Primary Care Provider:  Miguel Aschoff, MD Primary Cardiologist:  Meriam Sprague, MD  Chief Complaint    81 y.o. y/o female with a h/o HTN, atrial fib/flutter on chronic anticoagulation, mild AS, moderate MR, hx of rheumatic heart disease, and GERD who is pending left upper eyelid external ptosis repair, and presents today for telephonic  preoperative cardiovascular risk assessment.  Past Medical History    Past Medical History:  Diagnosis Date   Aortic stenosis    mild to moderate AS with mean AVG by echo 04/2020   Atrial flutter (HCC) 05/07/2014   With controlled ventricular rate on no AV nodal agents.   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   Rigid esophagoscopy, dilation, and Botox injection into cricopharyngeus muscle recommended    Essential hypertension    takes Amlodipine daily   GERD (gastroesophageal reflux disease)    takes Pantoprazole daily   History of blood transfusion    no abnormal reaction noted   History of migraine    last one a week   History of shingles    Hypertensive retinopathy of both eyes 03/07/2016   Moderate   Mitral valve regurgitation, rheumatic 05/05/2014   Moderate to severe with peak PA pressure of 56 mmHg on Echo 04/2014    Muscle spasm of both lower legs    takes Zanaflex daily as needed   Osteoarthritis    knees and hands   Peripheral edema    takes Lasix daily as needed    PONV (postoperative nausea and vomiting)    with recent dilatation of throat, had N/V   Systolic murmur    per patient report she has had rheumatic fever in childhood   Thoracic aortic atherosclerosis (HCC) 10/08/2019   Radiographic on CT chest 2020   Past Surgical History:  Procedure Laterality Date   ABDOMINAL HYSTERECTOMY     Fibroids   COLONOSCOPY W/ BIOPSIES AND POLYPECTOMY  CYST EXCISION Right    on hand   ESOPHAGOSCOPY WITH DILITATION N/A 04/14/2014   Procedure: RIGID ESOPHAGOSCOPY WITH DILITATION AND BOTOX  INJECTION;  Surgeon: Osborn Coho, MD;  Location: [facility] OR;  Service: ENT;  Laterality: N/A;   MULTIPLE TOOTH EXTRACTIONS     TOTAL KNEE ARTHROPLASTY Right 04/06/2015   TOTAL KNEE ARTHROPLASTY Right 04/06/2015   Procedure: RIGHT TOTAL KNEE ARTHROPLASTY;  Surgeon: Marcene Corning, MD;  Location: [facility]  OR;  Service: Orthopedics;  Laterality: Right;   TOTAL KNEE ARTHROPLASTY Left 09/14/2015   Procedure: TOTAL KNEE ARTHROPLASTY LEFT;  Surgeon: Marcene Corning, MD;  Location: [facility] OR;  Service: Orthopedics;  Laterality: Left;    Allergies  Allergies  Allergen Reactions   Ace Inhibitors Cough    History of Present Illness    [name] is a 81 y.o. female who presents via audio/video conferencing for a telehealth visit today.  Pt was last seen in cardiology clinic on 03/26/20, by dr. Shari Prows.  At that time Drisana Schweickert was doing well.  She is now pending left upper eyelid external ptosis repair.  Since his last visit, she has done well. She does have coronary calcifications on CT chest, but no anginal symptoms. Not on ASA given eliquis.    Home Medications    Prior to Admission medications   Medication Sig Start Date End Date Taking? Authorizing Provider  alendronate (FOSAMAX) 70 MG tablet Take 1 tablet (70 mg total) by mouth once a week. Take with a full glass of water on an empty stomach. 11/18/20   Miguel Aschoff, MD  amLODipine (NORVASC) 10 MG tablet Take 1 tablet (10 mg total) by mouth daily. 09/20/20   Miguel Aschoff, MD  apixaban (ELIQUIS) 5 MG TABS tablet Take 1 tablet (5 mg total) by mouth 2 (two) times daily. 09/20/20   Miguel Aschoff, MD  augmented betamethasone dipropionate (DIPROLENE-AF) 0.05 % ointment Apply topically daily. 03/02/20   Theotis Barrio, MD  fluticasone [facility]) 50 MCG/ACT nasal spray SHAKE LIQUID AND USE 1 SPRAY IN [facility] NOSTRIL DAILY 08/19/20   Miguel Aschoff, MD  hydrochlorothiazide (HYDRODIURIL) 25 MG tablet Take 1 tablet (25 mg total) by mouth daily. Patient not taking: Reported on 03/10/2021 09/20/20   Miguel Aschoff, MD  potassium chloride SA (KLOR-CON) 20 MEQ tablet Take 1 tablet (20 mEq total) by mouth daily. 04/01/20 03/27/21  Miguel Aschoff, MD  senna (SENOKOT) 8.6 MG TABS tablet Take 1 tablet (8.6 mg total)  by mouth 2 (two) times daily as needed for mild constipation. 08/19/20   Miguel Aschoff, MD    Physical Exam    Vital Signs:  [name] does not have vital signs available for review today.  Given telephonic nature of communication, physical exam is limited. AAOx3. NAD. Normal affect.  Speech and respirations are unlabored.  Accessory Clinical Findings    None  Assessment & Plan    1.  Preoperative Cardiovascular Risk Assessment:  She is able to complete 4.4 METS without angina. According to the RCRI she has 0.4% risk of MACE in this low risk procedure.   Therefore, based on ACC/AHA guidelines, the patient would be at acceptable risk for the planned procedure without further cardiovascular testing.   The patient was advised that if she develops new symptoms prior to surgery to contact our office to arrange for a follow-up visit, and she verbalized understanding.  I will route this recommendation to the requesting party via Epic  fax function and remove from pre-op pool. Please call with questions.    2. Anticoagulation recommendations  Per our clinical pharmacist:  Patient with diagnosis of aflutter on Eliquis for anticoagulation.     Procedure: left upper eyelid external ptosis repair Date of procedure: 03/23/21   CHA2DS2-VASc Score = 5  This indicates a 7.2% annual risk of stroke. The patient's score is based upon: CHF History: 0 HTN History: 1 Diabetes History: 0 Stroke History: 0 Vascular Disease History: 1 Age Score: 2 Gender Score: 1   CrCl 5377mL/min using adjusted body weight due to obesity Platelet count 201K   Per office protocol, patient can hold Eliquis for 1-2 days prior to procedure.     COVID-19 Education: The signs and symptoms of COVID-19 were discussed with the patient and how to seek care for testing (follow up with PCP or arrange E-visit).  The importance of social distancing was discussed today.  Patient Risk:   After full  review of this patient's history and clinical status, I feel that he is at least moderate risk for cardiac complications at this time, thus necessitating a telehealth visit sooner than our first available in office visit.  Time:   Today, I have spent 8 minutes with the patient with telehealth technology discussing medical history, symptoms, and management plan.     Roe RutherfordAngela Nicole Cheryal Salas, PA  03/11/2021, 11:52 AM

## 2021-03-14 MED ORDER — POTASSIUM CHLORIDE CRYS ER 20 MEQ PO TBCR: 20.0000 meq | EXTENDED_RELEASE_TABLET | Freq: Every day | ORAL | 3 refills | Status: DC

## 2021-03-23 DIAGNOSIS — H02422 Myogenic ptosis of left eyelid: Secondary | ICD-10-CM | POA: Diagnosis not present

## 2021-03-23 DIAGNOSIS — H53482 Generalized contraction of visual field, left eye: Secondary | ICD-10-CM | POA: Diagnosis not present

## 2021-03-24 ENCOUNTER — Ambulatory Visit: Payer: Medicare Other | Admitting: Behavioral Health

## 2021-03-24 DIAGNOSIS — F331 Major depressive disorder, recurrent, moderate: Secondary | ICD-10-CM

## 2021-03-24 DIAGNOSIS — Z6379 Other stressful life events affecting family and household: Secondary | ICD-10-CM

## 2021-03-24 DIAGNOSIS — F419 Anxiety disorder, unspecified: Secondary | ICD-10-CM

## 2021-03-24 NOTE — BH Specialist Note (Signed)
Integrated Behavioral Health via Telemedicine Visit ? ?03/24/2021 ?Marca Rosemary Holms ?[id number] ? ?Number of Integrated Behavioral Health Clinician visits: 4 ?Session Start time: 1330 ?Session End time: 1400 ?Total time in minutes: 30 min ? ?Referring Provider: Dr. Wilfred Lacy, MD ?Patient/Family location: Pt is home in private ?[facility] Provider location: [facility] Office ?All persons participating in visit: Pt & Clinician ?Types of Service: Individual psychotherapy ? ?I connected with [name] and/or Charlaine Dalton Casco's  self  via  Telephone or Video Enabled Telemedicine Application  (Video is Caregility application) and verified that I am speaking with the correct person using two identifiers. Discussed confidentiality: Yes  ? ?I discussed the limitations of telemedicine and the availability of in person appointments.  Discussed there is a possibility of technology failure and discussed alternative modes of communication if that failure occurs. ? ?I discussed that engaging in this telemedicine visit, they consent to the provision of behavioral healthcare and the services will be billed under their insurance. ? ?Patient and/or legal guardian expressed understanding and consented to Telemedicine visit: Yes  ? ?Presenting Concerns: ?Patient and/or family reports the following symptoms/concerns: elevated anx/dep due to Husb's situation & the support from Adult Children & one Adult Child who is not supportive ?Duration of problem: months since Husb has moved to a Facility since his most recent CVA; Severity of problem: moderate ? ?Patient and/or Family's Strengths/Protective Factors: ?Social connections, Social and Emotional competence, Concrete supports in place (healthy food, safe environments, etc.), and Sense of purpose ? ?Goals Addressed: ?Patient will: ? Reduce symptoms of: anxiety, depression, and stress  ? Increase knowledge and/or ability of: coping skills and stress reduction  ? Demonstrate  ability to: Increase healthy adjustment to current life circumstances and Begin healthy grieving over loss ? ?Progress towards Goals: ?Ongoing ? ?Interventions: ?Interventions utilized:  Supportive Counseling ?Standardized Assessments completed:  screeners prn ? ?Patient and/or Family Response: Pt receptive to call today & requests future appt ? ?Assessment: ?Patient currently experiencing elevated anx/dep due to the way her Adult Children are reacting to their [employer]. Pt has accepted the fact she can no longer care for her Husb's needs due to age. She needs her Adult Child who is being resistant to understand this about her StepMother. ? ?Pt is having to stand her ground & speak up for her own needs, even to her Husb who has a long Hx in their relationship of controlling her & knowing @ all times what she is doing & where she it. Pt is able to express resentment about this in a healthy way. ? ?Patient may benefit from cont'd ck-in appts to ensure mental health wellness. ? ?Plan: ?Follow up with behavioral health clinician on : 2-3 wks for 30 min on telehealth ?Behavioral recommendations: Cont to stand up for your own needs, hopefully your Baby Dtr will be more realistic as her Siblings speak to her about what is happening for Pt & her Husb. ?Referral(s): Integrated [employer] (In Clinic) ? ?I discussed the assessment and treatment plan with the patient and/or parent/guardian. They were provided an opportunity to ask questions and all were answered. They agreed with the plan and demonstrated an understanding of the instructions. ?  ?They were advised to call back or seek an in-person evaluation if the symptoms worsen or if the condition fails to improve as anticipated. ? ?Deneise Lever, LMFT ?

## 2021-03-25 ENCOUNTER — Other Ambulatory Visit: Payer: Self-pay | Admitting: Internal Medicine

## 2021-03-25 DIAGNOSIS — Z1231 Encounter for screening mammogram for malignant neoplasm of breast: Secondary | ICD-10-CM

## 2021-04-18 ENCOUNTER — Ambulatory Visit
Admission: RE | Admit: 2021-04-18 | Discharge: 2021-04-18 | Disposition: A | Discharge status: Home / Self Care | Payer: Medicare Other | Source: Ambulatory Visit | Attending: Internal Medicine | Admitting: Internal Medicine

## 2021-04-18 ENCOUNTER — Ambulatory Visit: Payer: Medicare Other

## 2021-04-18 DIAGNOSIS — Z1231 Encounter for screening mammogram for malignant neoplasm of breast: Secondary | ICD-10-CM

## 2021-04-25 ENCOUNTER — Ambulatory Visit: Payer: Medicare Other | Admitting: Behavioral Health

## 2021-04-25 DIAGNOSIS — F331 Major depressive disorder, recurrent, moderate: Secondary | ICD-10-CM

## 2021-04-25 DIAGNOSIS — F419 Anxiety disorder, unspecified: Secondary | ICD-10-CM

## 2021-04-25 NOTE — BH Specialist Note (Signed)
Integrated Behavioral Health via Telemedicine Visit ? ?04/25/2021 ?[name] ?[id number] ? ?Number of Integrated Behavioral Health Clinician visits: 5 ?Session Start time: 0945 ?Session End time: 1000 ?Total time in minutes: 15 min ? ?Referring Provider:  ?Patient/Family location: Dr. Wilfred Lacy, MD ?[facility] Provider location: [facility] Office ?All persons participating in visit: Pt & Clinician ?Types of Service: Individual psychotherapy ? ?I connected with [name] and/or [name]'s  self  via  Telephone or Video Enabled Telemedicine Application  (Video is Caregility application) and verified that I am speaking with the correct person using two identifiers. Discussed confidentiality: Yes  ? ?I discussed the limitations of telemedicine and the availability of in person appointments.  Discussed there is a possibility of technology failure and discussed alternative modes of communication if that failure occurs. ? ?I discussed that engaging in this telemedicine visit, they consent to the provision of behavioral healthcare and the services will be billed under their insurance. ? ?Patient and/or legal guardian expressed understanding and consented to Telemedicine visit: Yes  ? ?Presenting Concerns: ?Patient and/or family reports the following symptoms/concerns: reduced anx/dep due to Family & Husb being more accepting of his residency in a Facility ?Duration of problem: months since Husb has been placed in a Facility; Severity of problem: moderate ? ?Patient and/or Family's Strengths/Protective Factors: ?Social connections, Social and Emotional competence, Concrete supports in place (healthy food, safe environments, etc.), and Sense of purpose ? ?Goals Addressed: ?Patient will: ? Reduce symptoms of: anxiety, depression, and stress  ? Increase knowledge and/or ability of: coping skills and stress reduction  ? Demonstrate ability to: Increase healthy adjustment to current life  circumstances ? ?Progress towards Goals: ?Ongoing ? ?Interventions: ?Interventions utilized:  Solution-Focused Strategies and Supportive Counseling ?Standardized Assessments completed: Not Needed ? ?Patient and/or Family Response: Pt is receptive to call today & requests future appt ? ?Assessment: ?Patient currently experiencing reduction in her level of anx/dep. Her Gdtr & her 28yo Son are living w/Pt peacefully. ? ?Patient may benefit from cont'd Cslg for support & encouragement. ? ?Plan: ?Follow up with behavioral health clinician on : 2-3 mos for 30 min telehealth ?Behavioral recommendations: Attend to self-care needs ?Referral(s): Integrated [employer] (In Clinic) ? ?I discussed the assessment and treatment plan with the patient and/or parent/guardian. They were provided an opportunity to ask questions and all were answered. They agreed with the plan and demonstrated an understanding of the instructions. ?  ?They were advised to call back or seek an in-person evaluation if the symptoms worsen or if the condition fails to improve as anticipated. ? ?Deneise Lever, LMFT ?

## 2021-04-26 NOTE — Progress Notes (Deleted)
Cardiology Office Note:    Date:  04/26/2021   ID:  Abigail SitesLizzie Harrison July, DOB May 31, 1940, MRN [id number]  PCP:  Miguel AschoffWilliams, Julie Anne, MD   [facility]  Cardiologist:  Meriam SpragueHeather E Lizvet Chunn, MD  Advanced Practice Provider:  No care team member to display Electrophysiologist:  None    Referring MD: Miguel AschoffWilliams, Julie Anne, MD    History of Present Illness:    [name] is a 81 y.o. female with a hx of HTN, atrial flutter, and GERD who was previously followed by Norma FredricksonLori Gerhardt who now presents to clinic for follow-up.  Patient was found to be in atrial flutter when she presented for a surgical procedure at [facility] back on 04/13/2014. She had had a previous EKG on 03/16/14 which showed that she was in normal sinus rhythm at that time.  She was cleared for the surgery and went on to have her surgical procedure without incident.    Was last seen in clinic on 03/2021 for preoperative visit prior to left upper eyelid external ptosis repair. She was doing well with no CV symptoms. No testing needed at that time. TTE 04/2021 shows LVEF 65-70%, GLS -22.5%, normal RV, mild PHTN 38mmHg, severe LAE, moderate RAE, moderate MR, mild-to-moderate TR, mild-to-moderate AS, mild dilation of the ascending aorta.   Today, ***  Past Medical History:  Diagnosis Date   Aortic stenosis    mild to moderate AS with mean AVG 17mmHg by echo 04/2020   Atrial flutter (HCC) 05/07/2014   With controlled ventricular rate on no AV nodal agents.   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   Rigid esophagoscopy, dilation, and Botox injection into cricopharyngeus muscle recommended    Essential hypertension    takes Amlodipine daily   GERD (gastroesophageal reflux disease)    takes Pantoprazole daily   History of blood transfusion    no abnormal reaction noted   History of migraine    last one a  week   History of shingles    Hypertensive retinopathy of both eyes 03/07/2016   Moderate   Mitral valve regurgitation, rheumatic 05/05/2014   Moderate to severe with peak PA pressure of 56 mmHg on Echo 04/2014    Muscle spasm of both lower legs    takes Zanaflex daily as needed   Osteoarthritis    knees and hands   Peripheral edema    takes Lasix daily as needed    PONV (postoperative nausea and vomiting)    with recent dilatation of throat, had N/V   Systolic murmur    per patient report she has had rheumatic fever in childhood   Thoracic aortic atherosclerosis (HCC) 10/08/2019   Radiographic on CT chest 2020    Past Surgical History:  Procedure Laterality Date   ABDOMINAL HYSTERECTOMY     Fibroids   COLONOSCOPY W/ BIOPSIES AND POLYPECTOMY     CYST EXCISION Right    on hand   ESOPHAGOSCOPY WITH DILITATION N/A 04/14/2014   Procedure: RIGID ESOPHAGOSCOPY WITH DILITATION AND BOTOX  INJECTION;  Surgeon: Osborn Cohoavid Shoemaker, MD;  [facility];  Service: ENT;  Laterality: N/A;   MULTIPLE TOOTH EXTRACTIONS     TOTAL KNEE ARTHROPLASTY Right 04/06/2015   TOTAL KNEE ARTHROPLASTY Right 04/06/2015   Procedure: RIGHT TOTAL KNEE ARTHROPLASTY;  Surgeon: Marcene CorningPeter Dalldorf, MD;  Location: [facility] OR;  Service: Orthopedics;  Laterality: Right;   TOTAL KNEE  ARTHROPLASTY Left 09/14/2015   Procedure: TOTAL KNEE ARTHROPLASTY LEFT;  Surgeon: Marcene Corning, MD;  Location: [facility] OR;  Service: Orthopedics;  Laterality: Left;    Current Medications: No outpatient medications have been marked as taking for the 05/02/21 encounter (Appointment) with Meriam Sprague, MD.     Allergies:   Ace inhibitors   Social History   Socioeconomic History   Marital status: Married    Spouse name: Not on file   Number of children: Not on file   Years of education: Not on file   Highest education level: Not on file  Occupational History   Not on file  Tobacco Use   Smoking status: Never   Smokeless tobacco: Never  Vaping  Use   Vaping Use: Never used  Substance and Sexual Activity   Alcohol use: No   Drug use: No   Sexual activity: Not Currently  Other Topics Concern   Not on file  Social History Narrative   Current Social History 03/02/2020        Patient lives with spouse in a/an home / condo / townhome which is 1 story/stories. There are not steps up to the entrance the patient uses.       Patient's method of transportation is personal car.      The highest level of education was junior high / middle school.      The patient currently retired.      Identified important Relationships are God, husband, kids       Pets : none       Interests / Fun: "Walking into stores and looking around.  Riding around"       Current Stressors: "Bills, the war"       Religious / Personal Beliefs:        Other:     Social Determinants of Corporate investment banker Strain: Not on file  Food Insecurity: Not on file  Transportation Needs: Not on file  Physical Activity: Not on file  Stress: Not on file  Social Connections: Not on file     Family History: The patient's family history includes Asthma in her sister; Cancer in her brother, brother, and mother; Healthy in her daughter, daughter, daughter, and son; Heart attack in her sister; Other in her brother, brother, brother, brother, brother, and sister; Stroke in her father; Unexplained death in her sister.  ROS:   Please see the history of present illness.    Review of Systems  Constitutional:  Negative for chills and fever.  HENT:  Negative for hearing loss and sore throat.   Eyes:  Negative for blurred vision and redness.  Respiratory:  Negative for cough and shortness of breath.   Cardiovascular:  Positive for palpitations and leg swelling. Negative for chest pain, orthopnea, claudication and PND.  Gastrointestinal:  Negative for melena, nausea and vomiting.  Genitourinary:  Negative for dysuria and flank pain.  Musculoskeletal:  Negative for  myalgias.  Neurological:  Negative for dizziness and loss of consciousness.  Endo/Heme/Allergies:  Negative for polydipsia.  Psychiatric/Behavioral:  Negative for substance abuse.    EKGs/Labs/Other Studies Reviewed:    The following studies were reviewed today: TTE 2021-05-22: IMPRESSIONS   1. Left ventricular ejection fraction, by estimation, is 65 to 70%. The  left ventricle has normal function. The left ventricle has no regional  wall motion abnormalities. There is severe asymmetric left ventricular  hypertrophy of the basal-septal  segment. Left ventricular diastolic parameters are indeterminate. The  average left ventricular global longitudinal strain is -22.5 %. The global  longitudinal strain is normal.   2. Right ventricular systolic function is normal. The right ventricular  size is normal. There is mildly elevated pulmonary artery systolic  pressure. The estimated right ventricular systolic pressure is 38.0 mmHg.   3. Left atrial size was severely dilated.   4. Right atrial size was moderately dilated.   5. The mitral valve is grossly normal. Moderate mitral valve  regurgitation.   6. Tricuspid valve regurgitation is mild to moderate.   7. The aortic valve is tricuspid. There is moderate calcification of the  aortic valve. There is moderate thickening of the aortic valve. Aortic  valve regurgitation is mild. Mild to moderate aortic valve stenosis.  Aortic valve mean gradient measures 14.5   mmHg. Aortic valve Vmax measures 2.62 m/s.   8. Aortic dilatation noted. There is mild dilatation of the ascending  aorta, measuring 39 mm.   9. The inferior vena cava is normal in size with <50% respiratory  variability, suggesting right atrial pressure of 8 mmHg.   Comparison(s): 04/26/20 EF 60-65%. Mild-moderate AS mean PG,  peak PG. PA pressure . There was previously mild MR which is now  moderate.  TTE 03/23/17: Study Conclusions  - Left ventricle: The cavity  size was normal. There was mild    concentric hypertrophy. Systolic function was normal. The    estimated ejection fraction was in the range of 60% to 65%. Wall    motion was normal; there were no regional wall motion    abnormalities.  - Aortic valve: Right coronary cusp mobility was moderately    restricted. There was very mild stenosis. Valve area (VTI): 2.12    cm^2. Valve area (Vmax): 1.62 cm^2. Valve area (Vmean): 1.85    cm^2.  - Mitral valve: There was mild to moderate regurgitation.  - Left atrium: The atrium was moderately dilated.  - Right atrium: The atrium was moderately dilated.   EKG:  EKG is  ordered today.  The ekg ordered today demonstrates NSR with HR 60  Recent Labs: 02/10/2021: BUN 9; Creatinine, Ser 0.64; Potassium 3.5; Sodium 142  Recent Lipid Panel    Component Value Date/Time   CHOL 160 04/23/2019 1520   TRIG 113 04/23/2019 1520   HDL 44 04/23/2019 1520   CHOLHDL 3.6 04/23/2019 1520   CHOLHDL 2.8 12/13/2015 1220   VLDL 16 12/13/2015 1220   LDLCALC 95 04/23/2019 1520     Risk Assessment/Calculations:    CHA2DS2-VASc Score =    {This indicates a  % annual risk of stroke. The patient's score is based upon: CHF History: 0 HTN History: 1 Age Score: 2 Gender Score: 1    Physical Exam:    VS:  There were no vitals taken for this visit.    Wt Readings from Last 3 Encounters:  02/10/21 201 lb 14.4 oz (91.6 kg)  11/18/20 207 lb 9.6 oz (94.2 kg)  08/19/20 209 lb 3.2 oz (94.9 kg)     GEN:  Well nourished, well developed in no acute distress HEENT: Normal NECK: No JVD; No carotid bruits CARDIAC: RRR, 2/6 systolic murmur. No rubs, gallops RESPIRATORY:  Clear to auscultation without rales, wheezing or rhonchi  ABDOMEN: Soft, non-tender, non-distended MUSCULOSKELETAL:  Trace LE edema SKIN: Warm and dry NEUROLOGIC:  Alert and oriented x 3 PSYCHIATRIC:  Normal affect   ASSESSMENT:    No diagnosis found.  PLAN:    In order of  problems listed  above:  #Persistent Afib: CHADs-vasc 5. Doing well without palpitations. Tolerating eliquis. Not on nodal agents. -Continue apixaban  BID -HR controlled not on nodal agents   #HTN: Controlled at home. -Continue amlodipine  daily -Continue HCTZ  daily   #Rheumatic heart disease: #Mild-to-moderate AS #Moderate MR: TTE 04/2021 with mild-to-moderate AS with mean 14.10mmHg, Vmax 2.62 m/s. Moderate MR.  -Will continue yearly monitoring   #Coronary calcification on CT chest: No anginal symptoms. -No ASA given need for apixaban   Medication Adjustments/Labs and Tests Ordered: Current medicines are reviewed at length with the patient today.  Concerns regarding medicines are outlined above.  No orders of the defined types were placed in this encounter.  No orders of the defined types were placed in this encounter.   There are no Patient Instructions on file for this visit.    Signed, Meriam Sprague, MD  04/26/2021 1:06 PM     [facility]

## 2021-04-27 ENCOUNTER — Ambulatory Visit (HOSPITAL_COMMUNITY): Payer: Medicare Other | Attending: Cardiology

## 2021-04-27 DIAGNOSIS — I051 Rheumatic mitral insufficiency: Secondary | ICD-10-CM | POA: Insufficient documentation

## 2021-04-27 DIAGNOSIS — I10 Essential (primary) hypertension: Secondary | ICD-10-CM | POA: Diagnosis not present

## 2021-04-27 DIAGNOSIS — I350 Nonrheumatic aortic (valve) stenosis: Secondary | ICD-10-CM | POA: Diagnosis not present

## 2021-04-27 DIAGNOSIS — I099 Rheumatic heart disease, unspecified: Secondary | ICD-10-CM | POA: Diagnosis not present

## 2021-04-27 LAB — ECHOCARDIOGRAM COMPLETE
AR max vel: 2.19 cm2
AV Area VTI: 2.33 cm2
AV Area mean vel: 2.29 cm2
AV Mean grad: 14.5 mmHg
AV Peak grad: 27.4 mmHg
Ao pk vel: 2.62 m/s
MV M vel: 6.18 m/s
MV Peak grad: 152.8 mmHg
P 1/2 time: 487 msec
Radius: 0.5 cm
S' Lateral: 2.7 cm

## 2021-04-29 ENCOUNTER — Telehealth: Payer: Self-pay | Admitting: *Deleted

## 2021-04-29 DIAGNOSIS — I350 Nonrheumatic aortic (valve) stenosis: Secondary | ICD-10-CM

## 2021-04-29 DIAGNOSIS — I77819 Aortic ectasia, unspecified site: Secondary | ICD-10-CM

## 2021-04-29 DIAGNOSIS — I051 Rheumatic mitral insufficiency: Secondary | ICD-10-CM

## 2021-04-29 DIAGNOSIS — I071 Rheumatic tricuspid insufficiency: Secondary | ICD-10-CM

## 2021-04-29 NOTE — Telephone Encounter (Signed)
The patient has been notified of the result and verbalized understanding.  All questions (if any) were answered. ? ?Pt aware I will go ahead and place the repeat echo for one year out in the system and send a message to our [facility] Schedulers to reach out to her to arrange this appt.  ?Pt verbalized understanding and agrees with this plan. ? ?

## 2021-04-29 NOTE — Telephone Encounter (Signed)
-----   Message from Shellia Cleverly, RN sent at 04/28/2021  9:15 AM EDT ----- ? ?----- Message ----- ?From: Freada Bergeron, MD ?Sent: 04/27/2021   7:47 PM EDT ?To: Cv Div [facility] Triage ? ?Her echo shows normal pumping function. Her aortic valve is mildly-to-moderately narrowed. Her mitral valve leaks a moderate amount and her tricuspid valve leaks a mild-to-moderate amount. There is also mild dilation of the aorta. None of this should cause symptoms, but we will continue to monitor her valve and aorta closely with yearly echoes. ? ?

## 2021-05-02 ENCOUNTER — Ambulatory Visit: Payer: Medicare Other | Admitting: Cardiology

## 2021-05-18 ENCOUNTER — Ambulatory Visit: Payer: Medicare Other | Admitting: Behavioral Health

## 2021-05-18 DIAGNOSIS — F330 Major depressive disorder, recurrent, mild: Secondary | ICD-10-CM

## 2021-05-18 DIAGNOSIS — F419 Anxiety disorder, unspecified: Secondary | ICD-10-CM

## 2021-05-18 NOTE — BH Specialist Note (Signed)
Integrated Behavioral Health via Telemedicine Visit ? ?05/18/2021 ?Amiylah Rosemary Holms ?[id number] ? ?Number of Integrated Behavioral Health Clinician visits: 6 ?Session Start time: 1030 ?Session End time: 1100 ?Total time in minutes: 30 min ? ?Referring Provider: Dr. Wilfred Lacy, MD ?Patient/Family location: Pt is home in private ?[facility] Provider location: [facility] Office ?All persons participating in visit: Pt & Clinician ?Types of Service: Individual psychotherapy ? ?I connected with [name] and/or Charlaine Dalton Guild's  self  via  Telephone or Video Enabled Telemedicine Application  (Video is Caregility application) and verified that I am speaking with the correct person using two identifiers. Discussed confidentiality: Yes  ? ?I discussed the limitations of telemedicine and the availability of in person appointments.  Discussed there is a possibility of technology failure and discussed alternative modes of communication if that failure occurs. ? ?I discussed that engaging in this telemedicine visit, they consent to the provision of behavioral healthcare and the services will be billed under their insurance. ? ?Patient and/or legal guardian expressed understanding and consented to Telemedicine visit: Yes  ? ?Presenting Concerns: ?Patient and/or family reports the following symptoms/concerns: reduction in anx/dep due to Husb's improved attitude since his 65 celebration on May 4th. Her Family & her new perspective are helping. ?Duration of problem: 7 mos since Husb's admission to [facility] in Oct 2022; Severity of problem: mild ? ?Patient and/or Family's Strengths/Protective Factors: ?Social connections, Social and Emotional competence, Concrete supports in place (healthy food, safe environments, etc.), and Sense of purpose ? ?Goals Addressed: ?Patient will: ? Reduce symptoms of: anxiety and depression  ? Increase knowledge and/or ability of: coping skills  ? Demonstrate ability to: Increase  healthy adjustment to current life circumstances ? ?Progress towards Goals: ?Ongoing ? ?Interventions: ?Interventions utilized:  Solution-Focused Strategies and Supportive Counseling ?Standardized Assessments completed: Not Needed ? ?Patient and/or Family Response: Pt is receptive to call today & requests future appt ? ?Assessment: ?Patient currently experiencing marked reduction in her Sx of anx/dep due to healthy adjustment to Husb's transition into [facility] Facility.  ? ?Patient may benefit from cont'd Cslg. ? ?Plan: ?Follow up with behavioral health clinician on : one month out on telehealth for 30 min ck-in ?Behavioral recommendations: Get out & see your friend & shop, eat lunch! ?Referral(s): Integrated [employer] (In Clinic) ? ?I discussed the assessment and treatment plan with the patient and/or parent/guardian. They were provided an opportunity to ask questions and all were answered. They agreed with the plan and demonstrated an understanding of the instructions. ?  ?They were advised to call back or seek an in-person evaluation if the symptoms worsen or if the condition fails to improve as anticipated. ? ?Deneise Lever, LMFT ?

## 2021-06-01 DIAGNOSIS — Z96652 Presence of left artificial knee joint: Secondary | ICD-10-CM | POA: Diagnosis not present

## 2021-06-01 DIAGNOSIS — Z96653 Presence of artificial knee joint, bilateral: Secondary | ICD-10-CM | POA: Diagnosis not present

## 2021-06-01 DIAGNOSIS — Z96651 Presence of right artificial knee joint: Secondary | ICD-10-CM | POA: Diagnosis not present

## 2021-06-14 ENCOUNTER — Ambulatory Visit: Payer: Medicare Other | Admitting: Behavioral Health

## 2021-06-14 DIAGNOSIS — F419 Anxiety disorder, unspecified: Secondary | ICD-10-CM

## 2021-06-14 DIAGNOSIS — F330 Major depressive disorder, recurrent, mild: Secondary | ICD-10-CM

## 2021-06-14 NOTE — BH Specialist Note (Signed)
Integrated Behavioral Health via Telemedicine Visit  06/14/2021 Makhia Vosler [id number]  Number of Integrated Behavioral Health Clinician visits: 7 Session Start time: 1310 Session End time: 1330 Total time in minutes: 20 min  Referring Provider: Dr. Wilfred Lacy, MD Patient/Family location: Pt is home in private [facility] Provider location: [facility] Office All persons participating in visit: Pt & Clinician Types of Service: Individual psychotherapy  I connected with [name] and/or Charlaine Dalton Ollis's  self  via  Telephone or Video Enabled Telemedicine Application  (Video is Caregility application) and verified that I am speaking with the correct person using two identifiers. Discussed confidentiality: Yes   I discussed the limitations of telemedicine and the availability of in person appointments.  Discussed there is a possibility of technology failure and discussed alternative modes of communication if that failure occurs.  I discussed that engaging in this telemedicine visit, they consent to the provision of behavioral healthcare and the services will be billed under their insurance.  Patient and/or legal guardian expressed understanding and consented to Telemedicine visit: Yes   Presenting Concerns: Patient and/or family reports the following symptoms/concerns: reduction in her levels of anx/dep due to the absence of caregiving needs from Husb's presence in the home  Pt is enjoying getting to know herself & understand the level of stress & responsibility she has exp'd for so many years. She is laughing & not worrying all the time.  Duration of problem: years; Severity of problem: moderate  Patient and/or Family's Strengths/Protective Factors: Social connections, Social and Emotional competence, Concrete supports in place (healthy food, safe environments, etc.), and Sense of purpose  Goals Addressed: Patient will:  Reduce symptoms of: anxiety, depression, and  stress   Increase knowledge and/or ability of: coping skills and stress reduction   Demonstrate ability to: Increase healthy adjustment to current life circumstances  Progress towards Goals: Ongoing  Interventions: Interventions utilized:  Supportive Counseling and spiritual relatedness ; biblical connectedness Standardized Assessments completed:  screeners prn  Patient and/or Family Response: Pt is receptive to visit today  Assessment: Patient currently experiencing reduction in her levels of anx/dep & stress. Pt related she has grown in the therapy process.  Patient may benefit from Cslg prn with [facility] Clinician in the future.  Plan: Follow up with behavioral health clinician on : prn per Pt discretion in the future Behavioral recommendations: Fortify your spirit regularly! Reach out to [facility] & friends as necessary. Referral(s): Counselor prn  I discussed the assessment and treatment plan with the patient and/or parent/guardian. They were provided an opportunity to ask questions and all were answered. They agreed with the plan and demonstrated an understanding of the instructions.   They were advised to call back or seek an in-person evaluation if the symptoms worsen or if the condition fails to improve as anticipated.  Deneise Lever, LMFT

## 2021-06-20 ENCOUNTER — Encounter: Payer: Self-pay | Admitting: Neurology

## 2021-06-23 ENCOUNTER — Ambulatory Visit (INDEPENDENT_AMBULATORY_CARE_PROVIDER_SITE_OTHER): Payer: Medicare Other | Admitting: Internal Medicine

## 2021-06-23 VITALS — BP 126/84 | HR 69 | Temp 97.6°F | Ht 64.0 in | Wt 201.9 lb

## 2021-06-23 DIAGNOSIS — I2510 Atherosclerotic heart disease of native coronary artery without angina pectoris: Secondary | ICD-10-CM | POA: Diagnosis not present

## 2021-06-23 DIAGNOSIS — I10 Essential (primary) hypertension: Secondary | ICD-10-CM | POA: Diagnosis not present

## 2021-06-23 DIAGNOSIS — E669 Obesity, unspecified: Secondary | ICD-10-CM

## 2021-06-23 DIAGNOSIS — R053 Chronic cough: Secondary | ICD-10-CM

## 2021-06-23 DIAGNOSIS — K5909 Other constipation: Secondary | ICD-10-CM | POA: Diagnosis not present

## 2021-06-23 DIAGNOSIS — Z6379 Other stressful life events affecting family and household: Secondary | ICD-10-CM | POA: Diagnosis not present

## 2021-06-23 DIAGNOSIS — M810 Age-related osteoporosis without current pathological fracture: Secondary | ICD-10-CM

## 2021-06-23 DIAGNOSIS — M159 Polyosteoarthritis, unspecified: Secondary | ICD-10-CM | POA: Diagnosis not present

## 2021-06-23 DIAGNOSIS — K648 Other hemorrhoids: Secondary | ICD-10-CM

## 2021-06-23 DIAGNOSIS — D6869 Other thrombophilia: Secondary | ICD-10-CM | POA: Diagnosis not present

## 2021-06-23 DIAGNOSIS — I4892 Unspecified atrial flutter: Secondary | ICD-10-CM | POA: Diagnosis not present

## 2021-06-23 DIAGNOSIS — I4891 Unspecified atrial fibrillation: Secondary | ICD-10-CM | POA: Diagnosis not present

## 2021-06-23 DIAGNOSIS — H02402 Unspecified ptosis of left eyelid: Secondary | ICD-10-CM | POA: Insufficient documentation

## 2021-06-23 DIAGNOSIS — N904 Leukoplakia of vulva: Secondary | ICD-10-CM

## 2021-06-23 DIAGNOSIS — Z6834 Body mass index (BMI) 34.0-34.9, adult: Secondary | ICD-10-CM

## 2021-06-23 DIAGNOSIS — I350 Nonrheumatic aortic (valve) stenosis: Secondary | ICD-10-CM

## 2021-06-23 NOTE — Assessment & Plan Note (Addendum)
No exertional dyspnea - walked the entire hall to get here, no dyspnea at all.  No dizziness when standing.  In absence of symptoms which would warrant intervention, will monitor clinically without echo.

## 2021-06-23 NOTE — Assessment & Plan Note (Signed)
Apixaban continues to be well tolerated.  Continue.

## 2021-06-23 NOTE — Assessment & Plan Note (Signed)
Well controlled on an OTC medication obtained by her dtr; she is not aware of the name.

## 2021-06-23 NOTE — Patient Instructions (Signed)
Great to see you today, [name]!  You just get better and better each time I see you!  Today we talked about repeating your bone density scan (DEXA) and your vitamin D level to make sure we are adequately treating your osteoporosis.  Walking helps with balance and with bone strength (and all sorts of other health benefits), so if you can find a walking partner and get some fun exercise, it will pay off!  Please take a look at your medicines so that we can figure out which one you are missing.  Would you call the office please to let us know what you might not be taking?  Thank you!  Keep up the good work.  I'm so glad that you are taking care of yourself!  Take care and stay well,  Dr. Mayford Knife

## 2021-06-23 NOTE — Progress Notes (Signed)
Doing great!  She becomes less and less anxious and frought as time passes with her husband's ongoing stay (probably permanent) in long term care.  She is taking better care of herself.  No dypsnea, palpitations, chest tightness, constipation, indigestion.  Continues to have her chronic morning cough.  Forgot to bring meds today.  Driving independently.  Vision is blurry.  No explanation for her lazy lid per her eye doctor, who is reportedly referring her to a neurologist.  Taking her bone medicine! Due for DEXA.  Missing a medicine - "I throwed the bottle away, I don't know what it is, it's still in my medicine organizer by I don't want to take it because I don't know what it is" (I asked her to call us and describe the pill so that we can sort out what it is....doubtful she's not taking her antihypertensives, as her BP is controlled).  Dentures not fitting well.  Chronic L ptosis.    BP 126/84 (BP Location: Right Arm, Patient Position: Sitting, Cuff Size: Small)   Pulse 69   Temp 97.6 F (36.4 C) (Oral)   Ht 5\' 4"  (1.626 m)   Wt 201 lb 14.4 oz (91.6 kg)   SpO2 98%   BMI 34.66 kg/m   Exam:  Nicely dressed and groomed, energetic and bright.  Normal get up and go test, excellent pace.  Chronic L ptosis present (being evaluated by ophthalmologist, referred to neurologist).  Dentures present.  Oropharynx normal-appearing.  No JVD.  Lungs clear to bases, non labored breathing.  No JVD.  Heart irreg irreg. Systolic murmur at base and LLSB unchanged.  No LE edema.  DP  pulses 2+.  Feet in good repair, nails trimmed.  Skin turgor normal.    Assessment and plan:   Aortic stenosis No exertional dyspnea - walked the entire hall to get here, no dyspnea at all.  No dizziness when standing.  In absence of symptoms which would warrant intervention, will monitor clinically without echo.    Atrial fibrillation and flutter (HCC) No palpitations or sensation of heart racing.  No chest tightness.   Auscultates irregular irregular today.  Internal hemorrhoids Asymptomatic, no bleeding or discomfort.  "My daughter put me on a colon cleaner or something, it works great".   Primary osteoarthritis involving multiple joints Doing OK!  Tylenol prn helps.  No functionally limiting.  Most problematic in knees (both replaced?) Sometimes nocturnal pain.  Takes 2 tylenol at night and in the morning.    Lichen sclerosus of female genitalia Medicine controls itching and stinging.  Continue to monitor.    Stress due to illness of family member Mood has improved significantly as she has more mental space to recover from being chronically controlled.    Chronic cough Nocturnal tickle in the throat, drinks a sip of water.  Quite an attack, no heartburn  Osteoporosis, post-menopausal Due for DEXA, ordered.    Essential hypertension Well controlled on HCTZ 25 mg and amlodipine 5 mg daily, with KCl supplementation.  Continue.      Chronic constipation Well controlled on an OTC medication obtained by her dtr; she is not aware of the name.   Hypercoagulability due to atrial fibrillation (HCC) Apixaban continues to be well tolerated.  Continue.    Obesity, Class I, BMI 30-34.9 Weight is stable since 02/2021, but down several pounds from a year ago.  She is taking better care of herself, moving more, sleeping better.  Aggressive weight loss is not desired, though walking  for general health benefits is encouraged.  RTC 40M HTN, mobility, f/u DEXA

## 2021-06-23 NOTE — Assessment & Plan Note (Signed)
 Due for DEXA, ordered.

## 2021-06-23 NOTE — Assessment & Plan Note (Addendum)
Asymptomatic, no bleeding or discomfort.  "My daughter put me on a colon cleaner or something, it works great".

## 2021-06-23 NOTE — Assessment & Plan Note (Signed)
Doing OK!  Tylenol prn helps.  No functionally limiting.  Most problematic in knees (both replaced?) Sometimes nocturnal pain.  Takes 2 tylenol at night and in the morning.

## 2021-06-23 NOTE — Assessment & Plan Note (Signed)
Nocturnal tickle in the throat, drinks a sip of water.  Quite an attack, no heartburn

## 2021-06-23 NOTE — Assessment & Plan Note (Signed)
Mood has improved significantly as she has more mental space to recover from being chronically controlled.

## 2021-06-23 NOTE — Assessment & Plan Note (Addendum)
No palpitations or sensation of heart racing.  No chest tightness.  Auscultates irregular irregular today.

## 2021-06-23 NOTE — Assessment & Plan Note (Signed)
Well controlled on HCTZ 25 mg and amlodipine 5 mg daily, with KCl supplementation.  Continue.

## 2021-06-23 NOTE — Assessment & Plan Note (Addendum)
Medicine controls itching and stinging.  Continue to monitor.

## 2021-06-24 LAB — VITAMIN D 25 HYDROXY (VIT D DEFICIENCY, FRACTURES): Vit D, 25-Hydroxy: 16.1 ng/mL — ABNORMAL LOW (ref 30.0–100.0)

## 2021-06-28 ENCOUNTER — Ambulatory Visit: Payer: Medicare Other | Admitting: Behavioral Health

## 2021-06-28 DIAGNOSIS — F419 Anxiety disorder, unspecified: Secondary | ICD-10-CM

## 2021-06-28 DIAGNOSIS — F331 Major depressive disorder, recurrent, moderate: Secondary | ICD-10-CM

## 2021-06-28 NOTE — BH Specialist Note (Signed)
Integrated Behavioral Health via Telemedicine Visit  06/28/2021 [name] [id number]  Number of Integrated Behavioral Health Clinician visits: 8 Session Start time: 1300 Session End time: 1330 Total time in minutes: 30 min  Referring Provider: Dr. Wilfred Lacy, MD Patient/Family location: Pt is home in private [facility] Provider location: [facility] Office  All persons participating in visit: Pt & Clinician Types of Service: Individual psychotherapy  I connected with [name] and/or [name]'s  self  via  Telephone or Video Enabled Telemedicine Application  (Video is Caregility application) and verified that I am speaking with the correct person using two identifiers. Discussed confidentiality: Yes   I discussed the limitations of telemedicine and the availability of in person appointments.  Discussed there is a possibility of technology failure and discussed alternative modes of communication if that failure occurs.  I discussed that engaging in this telemedicine visit, they consent to the provision of behavioral healthcare and the services will be billed under their insurance.  Patient and/or legal guardian expressed understanding and consented to Telemedicine visit: Yes   Presenting Concerns: Patient and/or family reports the following symptoms/concerns: dec in anx/dep due to her adjustment to her Husb's absence from the home. He is in Care Facility. Duration of problem: years; Severity of problem: mild  Patient and/or Family's Strengths/Protective Factors: Social and Emotional competence, Concrete supports in place (healthy food, safe environments, etc.), and Sense of purpose  Goals Addressed: Patient will:  Reduce symptoms of: anxiety and depression   Increase knowledge and/or ability of: coping skills and stress reduction   Demonstrate ability to: Increase healthy adjustment to current life circumstances  Progress towards Goals: Revised; Pt is  aware of Clinician's transition & grateful for our session times together. Pt is aware she can schedule w/New Provider in the future.  Interventions: Interventions utilized:  Supportive Counseling Standardized Assessments completed:  screeners prn  Patient and/or Family Response: Pt is receptive to call today.  Assessment: Patient currently experiencing reduction in anx/dep due to the ability for her to express her feelings to an Objective Listener.   Patient may benefit from intermittent Cslg prn.  Plan: Follow up with behavioral health clinician on : TBD by New Provider Behavioral recommendations: Cont to care for yourself & believe in your future & freedom Referral(s): Integrated [employer] (In Clinic)  I discussed the assessment and treatment plan with the patient and/or parent/guardian. They were provided an opportunity to ask questions and all were answered. They agreed with the plan and demonstrated an understanding of the instructions.   They were advised to call back or seek an in-person evaluation if the symptoms worsen or if the condition fails to improve as anticipated.  Deneise Lever, LMFT

## 2021-07-07 ENCOUNTER — Ambulatory Visit (INDEPENDENT_AMBULATORY_CARE_PROVIDER_SITE_OTHER): Payer: Medicare Other | Admitting: Student

## 2021-07-07 DIAGNOSIS — J40 Bronchitis, not specified as acute or chronic: Secondary | ICD-10-CM | POA: Insufficient documentation

## 2021-07-07 DIAGNOSIS — J209 Acute bronchitis, unspecified: Secondary | ICD-10-CM | POA: Diagnosis not present

## 2021-07-07 MED ORDER — GUAIFENESIN-DM 100-10 MG/5ML PO SYRP: 5.0000 mL | ORAL_SOLUTION | ORAL | 0 refills | Status: DC | PRN

## 2021-07-07 NOTE — Patient Instructions (Signed)
Thank you, [name] for allowing [place] to provide your care today. Today we discussed.  Acute Bronchitis I will be sending in a new cough medicine, please do not take with your other cough medicines. Please follow up in the clinic next week if you are not doing better.     I have ordered the following labs for you:  Lab Orders  No laboratory test(s) ordered today      Referrals ordered today:   Referral Orders  No referral(s) requested today     I have ordered the following medication/changed the following medications:   Stop the following medications: There are no discontinued medications.   Start the following medications: Meds ordered this encounter  Medications   guaiFENesin-dextromethorphan (ROBITUSSIN DM) 100-10 MG/5ML syrup    Sig: Take 5 mLs by mouth every 4 (four) hours as needed for cough.    Dispense:  118 mL    Refill:  0     Follow up:  1 week     Should you have any questions or concerns please call the internal medicine clinic at [phone].    Thalia Bloodgood, D.O. [facility]

## 2021-07-07 NOTE — Progress Notes (Signed)
   CC: acute cough  This is a telephone encounter between [employer] and Thalia Bloodgood on 07/07/2021 for acute cough. The visit was conducted with the patient located at home and Thalia Bloodgood at [facility]. The patient's identity was confirmed using their DOB and current address. The patient has consented to being evaluated through a telephone encounter and understands the associated risks (an examination cannot be done and the patient may need to come in for an appointment) / benefits (allows the patient to remain at home, decreasing exposure to coronavirus). I personally spent 10 minutes on medical discussion.   HPI:  Ms.Nyella Summerlyn Fickel is a 81 y.o. with PMH as below.   Please see A&P for assessment of the patient's acute and chronic medical conditions.   Past Medical History:  Diagnosis Date   Aortic stenosis    mild to moderate AS with mean AVG by echo 04/2020   Atrial flutter (HCC) 05/07/2014   With controlled ventricular rate on no AV nodal agents.   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   Rigid esophagoscopy, dilation, and Botox injection into cricopharyngeus muscle recommended    Essential hypertension    takes Amlodipine daily   GERD (gastroesophageal reflux disease)    takes Pantoprazole daily   History of blood transfusion    no abnormal reaction noted   History of migraine    last one a week   History of shingles    Hypertensive retinopathy of both eyes 03/07/2016   Moderate   Mitral valve regurgitation, rheumatic 05/05/2014   Moderate to severe with peak PA pressure of 56 mmHg on Echo 04/2014    Muscle spasm of both lower legs    takes Zanaflex daily as needed   Osteoarthritis    knees and hands   Peripheral edema    takes Lasix daily as needed    PONV (postoperative nausea and vomiting)    with recent dilatation of throat, had N/V   Systolic murmur     per patient report she has had rheumatic fever in childhood   Thoracic aortic atherosclerosis (HCC) 10/08/2019   Radiographic on CT chest 2020   Review of Systems:   + productive cough - fever, chills, chest pain, shortness of breath    Assessment & Plan:   Acute bronchitis Assessment: Patient with exacerbation of her chronic cough for the past 4 days. It is worse in the evening time. She notes that her cough has been productive, she feels the mucuos is stuck in her chest. She denies fever, chills, shortness of breath, chest pain, sore throat, or nasal congestion. She denies being around anyone who has been sick but has been around the hospital and went to church recently.   She has been trying robitussin and delsym intermittently. Has not tried other medications. Will send in prescription for robitussin DM and instructed continue use of Flonase. Will avoid robitussin with codeine, due to patient's age (on Beers list.) discussed follow up in one week, if improvement can cancel appointment. Patient instructed to come in sooner if feels worse before then. If worsens will consider chest xray if lung sounds abnormal.  Plan: -Prescription for robitussin DM sent    Patient discussed with Dr. Lorriane Shire Internal Medicine Resident

## 2021-07-07 NOTE — Assessment & Plan Note (Addendum)
Assessment: Patient with exacerbation of her chronic cough for the past 4 days. It is worse in the evening time. She notes that her cough has been productive, she feels the mucuos is stuck in her chest. She denies fever, chills, shortness of breath, chest pain, sore throat, or nasal congestion. She denies being around anyone who has been sick but has been around the hospital and went to church recently.   She has been trying robitussin and delsym intermittently. Has not tried other medications. Will send in prescription for robitussin DM and instructed continue use of Flonase. Will avoid robitussin with codeine, due to patient's age (on Beers list.) discussed follow up in one week, if improvement can cancel appointment. Patient instructed to come in sooner if feels worse before then. If worsens will consider chest xray if lung sounds abnormal.  Plan: -Prescription for robitussin DM sent

## 2021-07-12 ENCOUNTER — Other Ambulatory Visit: Payer: Self-pay | Admitting: Internal Medicine

## 2021-07-12 ENCOUNTER — Encounter: Payer: Medicare Other | Admitting: Student

## 2021-07-12 DIAGNOSIS — M810 Age-related osteoporosis without current pathological fracture: Secondary | ICD-10-CM

## 2021-08-08 ENCOUNTER — Ambulatory Visit: Payer: Self-pay | Admitting: Licensed Clinical Social Worker

## 2021-08-08 NOTE — Patient Instructions (Signed)
Visit Information  Instructions:   Patient was given the following information about care management and care coordination services today, agreed to services, and gave verbal consent: 1.care management/care coordination services include personalized support from designated clinical staff supervised by their physician, including individualized plan of care and coordination with other care providers 2. 24/7 contact phone numbers for assistance for urgent and routine care needs. 3. The patient may stop care management/care coordination services at any time by phone call to the office staff.  Patient verbalizes understanding of instructions and care plan provided today and agrees to view in MyChart. Active MyChart status and patient understanding of how to access instructions and care plan via MyChart confirmed with patient.     No further follow up required: .  Onyinyechi Huante, BSW , MSW Social Worker [facility] Care Management  [phone]      

## 2021-08-08 NOTE — Patient Outreach (Signed)
  Care Coordination   Initial Visit Note   08/08/2021 Name: Weslie Rasmus MRN: [id number] DOB: 1940/10/17  Terrisha Lopata Girardot is a 81 y.o. year old female who sees Mayford Knife, Dorene Ar, MD for primary care. I spoke with  [name] by phone today  What matters to the patients health and wellness today?  SW assistance patient with PCP contact information and patient discussed her spouse. SW assisted with patients needs and advised patient to contact Clinic if patient wants to see a nurse. Patient declined needing a nurse to outreach at this time. SW removed self from care team and advised patient to contact PCP if SW assistance is needed in the future.    SDOH assessments and interventions completed:  Yes     Care Coordination Interventions Activated:  Yes  Care Coordination Interventions:  Yes, provided   Follow up plan: No further intervention required.   Encounter Outcome:  Pt. Visit Completed  Christen Butter, BSW  Social Worker [facility]  [phone]

## 2021-08-27 ENCOUNTER — Emergency Department (HOSPITAL_COMMUNITY)
Admission: EM | Admit: 2021-08-27 | Discharge: 2021-08-27 | Disposition: A | Discharge status: Home / Self Care | Payer: Medicare Other | Attending: Emergency Medicine | Admitting: Emergency Medicine

## 2021-08-27 ENCOUNTER — Emergency Department (HOSPITAL_COMMUNITY): Payer: Medicare Other

## 2021-08-27 ENCOUNTER — Encounter (HOSPITAL_COMMUNITY): Payer: Self-pay

## 2021-08-27 DIAGNOSIS — R0789 Other chest pain: Secondary | ICD-10-CM | POA: Diagnosis not present

## 2021-08-27 DIAGNOSIS — I10 Essential (primary) hypertension: Secondary | ICD-10-CM | POA: Insufficient documentation

## 2021-08-27 DIAGNOSIS — R052 Subacute cough: Secondary | ICD-10-CM | POA: Insufficient documentation

## 2021-08-27 DIAGNOSIS — Z7901 Long term (current) use of anticoagulants: Secondary | ICD-10-CM | POA: Diagnosis not present

## 2021-08-27 DIAGNOSIS — Z20822 Contact with and (suspected) exposure to covid-19: Secondary | ICD-10-CM | POA: Insufficient documentation

## 2021-08-27 DIAGNOSIS — E876 Hypokalemia: Secondary | ICD-10-CM | POA: Diagnosis not present

## 2021-08-27 DIAGNOSIS — Z79899 Other long term (current) drug therapy: Secondary | ICD-10-CM | POA: Diagnosis not present

## 2021-08-27 DIAGNOSIS — J3489 Other specified disorders of nose and nasal sinuses: Secondary | ICD-10-CM | POA: Diagnosis not present

## 2021-08-27 DIAGNOSIS — R059 Cough, unspecified: Secondary | ICD-10-CM | POA: Diagnosis not present

## 2021-08-27 DIAGNOSIS — R739 Hyperglycemia, unspecified: Secondary | ICD-10-CM | POA: Diagnosis not present

## 2021-08-27 DIAGNOSIS — R0981 Nasal congestion: Secondary | ICD-10-CM | POA: Diagnosis not present

## 2021-08-27 LAB — I-STAT CHEM 8, ED
BUN: 11 mg/dL (ref 8–23)
Calcium, Ion: 1.11 mmol/L — ABNORMAL LOW (ref 1.15–1.40)
Chloride: 102 mmol/L (ref 98–111)
Creatinine, Ser: 0.5 mg/dL (ref 0.44–1.00)
Glucose, Bld: 111 mg/dL — ABNORMAL HIGH (ref 70–99)
HCT: 48 % — ABNORMAL HIGH (ref 36.0–46.0)
Hemoglobin: 16.3 g/dL — ABNORMAL HIGH (ref 12.0–15.0)
Potassium: 3.4 mmol/L — ABNORMAL LOW (ref 3.5–5.1)
Sodium: 140 mmol/L (ref 135–145)
TCO2: 25 mmol/L (ref 22–32)

## 2021-08-27 LAB — CBC WITH DIFFERENTIAL/PLATELET
Abs Immature Granulocytes: 0.02 10*3/uL (ref 0.00–0.07)
Basophils Absolute: 0 10*3/uL (ref 0.0–0.1)
Basophils Relative: 1 %
Eosinophils Absolute: 0.2 10*3/uL (ref 0.0–0.5)
Eosinophils Relative: 3 %
HCT: 46.6 % — ABNORMAL HIGH (ref 36.0–46.0)
Hemoglobin: 15.6 g/dL — ABNORMAL HIGH (ref 12.0–15.0)
Immature Granulocytes: 0 %
Lymphocytes Relative: 25 %
Lymphs Abs: 1.8 10*3/uL (ref 0.7–4.0)
MCH: 31.1 pg (ref 26.0–34.0)
MCHC: 33.5 g/dL (ref 30.0–36.0)
MCV: 92.8 fL (ref 80.0–100.0)
Monocytes Absolute: 0.6 10*3/uL (ref 0.1–1.0)
Monocytes Relative: 8 %
Neutro Abs: 4.5 10*3/uL (ref 1.7–7.7)
Neutrophils Relative %: 63 %
Platelets: 269 10*3/uL (ref 150–400)
RBC: 5.02 MIL/uL (ref 3.87–5.11)
RDW: 13 % (ref 11.5–15.5)
WBC: 7.1 10*3/uL (ref 4.0–10.5)
nRBC: 0 % (ref 0.0–0.2)

## 2021-08-27 LAB — COMPREHENSIVE METABOLIC PANEL
ALT: 20 U/L (ref 0–44)
AST: 22 U/L (ref 15–41)
Albumin: 4.2 g/dL (ref 3.5–5.0)
Alkaline Phosphatase: 39 U/L (ref 38–126)
Anion gap: 10 (ref 5–15)
BUN: 12 mg/dL (ref 8–23)
CO2: 24 mmol/L (ref 22–32)
Calcium: 9.8 mg/dL (ref 8.9–10.3)
Chloride: 106 mmol/L (ref 98–111)
Creatinine, Ser: 0.68 mg/dL (ref 0.44–1.00)
GFR, Estimated: >60 mL/min (ref >60)
Glucose, Bld: 107 mg/dL — ABNORMAL HIGH (ref 70–99)
Potassium: 3.2 mmol/L — ABNORMAL LOW (ref 3.5–5.1)
Sodium: 140 mmol/L (ref 135–145)
Total Bilirubin: 1 mg/dL (ref 0.3–1.2)
Total Protein: 7.9 g/dL (ref 6.5–8.1)

## 2021-08-27 LAB — RESP PANEL BY RT-PCR (FLU A&B, COVID) ARPGX2
Influenza A by PCR: NEGATIVE
Influenza B by PCR: NEGATIVE
SARS Coronavirus 2 by RT PCR: NEGATIVE

## 2021-08-27 LAB — TROPONIN I (HIGH SENSITIVITY): Troponin I (High Sensitivity): 10 ng/L (ref <18)

## 2021-08-27 MED ORDER — AZITHROMYCIN 250 MG PO TABS: 250.0000 mg | ORAL_TABLET | Freq: Every day | ORAL | 0 refills | Status: DC

## 2021-08-27 MED ORDER — POTASSIUM CHLORIDE CRYS ER 20 MEQ PO TBCR
40.0000 meq | EXTENDED_RELEASE_TABLET | Freq: Once | ORAL | Status: AC
  Administered 2021-08-27: 40 meq via ORAL
  Filled 2021-08-27: qty 2

## 2021-08-27 MED ORDER — IPRATROPIUM-ALBUTEROL 0.5-2.5 (3) MG/3ML IN SOLN
3.0000 mL | Freq: Once | RESPIRATORY_TRACT | Status: AC
  Administered 2021-08-27: 3 mL via RESPIRATORY_TRACT
  Filled 2021-08-27: qty 3

## 2021-08-27 MED ORDER — PREDNISONE 20 MG PO TABS: 40.0000 mg | ORAL_TABLET | Freq: Every day | ORAL | 0 refills | Status: DC

## 2021-08-27 MED ORDER — ALBUTEROL SULFATE HFA 108 (90 BASE) MCG/ACT IN AERS: 1.0000 | INHALATION_SPRAY | Freq: Four times a day (QID) | RESPIRATORY_TRACT | 0 refills | Status: DC | PRN

## 2021-08-27 NOTE — ED Provider Notes (Signed)
[facility]-EMERGENCY DEPT Provider Note   CSN: [id number] Arrival date & time: 08/27/21  9323     History Chief Complaint  Patient presents with   Cough    [name] is a 81 y.o. female with history of GERD, hypertension presents the emergency apartment for evaluation of occasionally productive cough for the past 2 months with associated rhinorrhea and nasal congestion.  She denies any fever, chills, body aches, shortness of breath, or any hemoptysis.  She reports some occasional chest tightness not associated with her cough for the past 2 weeks.  She reports that she has tried over-the-counter medication without relief of symptoms.   Cough Associated symptoms: rhinorrhea   Associated symptoms: no chest pain, no chills, no fever, no myalgias, no shortness of breath and no sore throat        Home Medications Prior to Admission medications   Medication Sig Start Date End Date Taking? Authorizing Provider  albuterol (VENTOLIN HFA) 108 (90 Base) MCG/ACT inhaler Inhale 1-2 puffs into the lungs every 6 (six) hours as needed for wheezing or shortness of breath. 08/27/21  Yes Achille Rich, PA-C  azithromycin (ZITHROMAX) 250 MG tablet Take 1 tablet (250 mg total) by mouth daily. Take first 2 tablets together, then 1 every day until finished. 08/27/21  Yes Achille Rich, PA-C  predniSONE (DELTASONE) 20 MG tablet Take 2 tablets (40 mg total) by mouth daily. 08/27/21  Yes Achille Rich, PA-C  alendronate (FOSAMAX) 70 MG tablet Take 1 tablet (70 mg total) by mouth once a week. Take with a full glass of water on an empty stomach. 11/18/20   Miguel Aschoff, MD  amLODipine (NORVASC) 10 MG tablet Take 1 tablet (10 mg total) by mouth daily. 09/20/20   Miguel Aschoff, MD  apixaban (ELIQUIS) 5 MG TABS tablet Take 1 tablet (5 mg total) by mouth 2 (two) times daily. 09/20/20   Miguel Aschoff, MD  augmented betamethasone dipropionate (DIPROLENE-AF) 0.05 %  ointment Apply topically daily. 03/02/20   Theotis Barrio, MD  fluticasone [facility]) 50 MCG/ACT nasal spray SHAKE LIQUID AND USE 1 SPRAY IN [facility] NOSTRIL DAILY 08/19/20   Miguel Aschoff, MD  guaiFENesin-dextromethorphan [facility] DM) 100-10 MG/5ML syrup Take 5 mLs by mouth every 4 (four) hours as needed for cough. 07/07/21   Katsadouros, Vasilios, MD  hydrochlorothiazide (HYDRODIURIL) 25 MG tablet Take 1 tablet (25 mg total) by mouth daily. 09/20/20   Miguel Aschoff, MD  potassium chloride SA (KLOR-CON M) 20 MEQ tablet Take 1 tablet (20 mEq total) by mouth daily. 03/14/21 03/09/22  Miguel Aschoff, MD      Allergies    Ace inhibitors    Review of Systems   Review of Systems  Constitutional:  Negative for chills and fever.  HENT:  Positive for congestion and rhinorrhea. Negative for sore throat.   Respiratory:  Positive for cough and chest tightness. Negative for shortness of breath.   Cardiovascular:  Negative for chest pain.  Gastrointestinal:  Negative for abdominal pain, nausea and vomiting.  Musculoskeletal:  Negative for myalgias.    Physical Exam Updated Vital Signs BP 122/77   Pulse 78   Temp 98.1 F (36.7 C) (Oral)   Resp 18   SpO2 98%  Physical Exam Vitals and nursing note reviewed.  Constitutional:      General: She is not in acute distress.    Appearance: Normal appearance. She is not ill-appearing or toxic-appearing.  HENT:  Head: Normocephalic and atraumatic.     Right Ear: Tympanic membrane, ear canal and external ear normal.     Left Ear: Tympanic membrane, ear canal and external ear normal.     Nose:     Comments: Bilateral nasal turbinate edema and erythema with scant clear nasal discharge.    Mouth/Throat:     Mouth: Mucous membranes are moist.     Comments: No pharyngeal erythema, exudate, or edema noted.  Uvula midline.  Airway patent.  Moist mucous membranes. Eyes:     General: No scleral icterus.    Conjunctiva/sclera: Conjunctivae normal.   Cardiovascular:     Rate and Rhythm: Normal rate and regular rhythm.  Pulmonary:     Effort: Pulmonary effort is normal. No respiratory distress.     Breath sounds: Wheezing present.     Comments: Mild expiratory wheezing auscultated in bilateral lower bases.  No respiratory distress, patient speaking in full sentences with ease and is satting well on room air without increased work of breathing. Abdominal:     General: Bowel sounds are normal.     Palpations: Abdomen is soft.     Tenderness: There is no abdominal tenderness. There is no guarding or rebound.  Musculoskeletal:        General: No deformity.     Cervical back: Normal range of motion. No rigidity.  Lymphadenopathy:     Cervical: No cervical adenopathy.  Skin:    General: Skin is warm and dry.     Findings: No rash.  Neurological:     General: No focal deficit present.     Mental Status: She is alert. Mental status is at baseline.     ED Results / Procedures / Treatments   Labs (all labs ordered are listed, but only abnormal results are displayed) Labs Reviewed  CBC WITH DIFFERENTIAL/PLATELET - Abnormal; Notable for the following components:      Result Value   Hemoglobin 15.6 (*)    HCT 46.6 (*)    All other components within normal limits  COMPREHENSIVE METABOLIC PANEL - Abnormal; Notable for the following components:   Potassium 3.2 (*)    Glucose, Bld 107 (*)    All other components within normal limits  I-STAT CHEM 8, ED - Abnormal; Notable for the following components:   Potassium 3.4 (*)    Glucose, Bld 111 (*)    Calcium, Ion 1.11 (*)    Hemoglobin 16.3 (*)    HCT 48.0 (*)    All other components within normal limits  RESP PANEL BY RT-PCR (FLU A&B, COVID) ARPGX2  TROPONIN I (HIGH SENSITIVITY)    EKG EKG Interpretation  Date/Time:  Saturday August 27 2021 13:37:55 EDT Ventricular Rate:  67 PR Interval:    QRS Duration: 93 QT Interval:  460 QTC Calculation: 486 R Axis:   24 Text  Interpretation: Atrial fibrillation Anteroseptal infarct, old Repol abnrm suggests ischemia, anterolateral New t wave inversions compared to flat t-waves prior in the septal leads, inferolateral t-wave inversions unchanged from prior EKG Confirmed by Arturo Morton ([id number]) on 08/27/2021 3:01:03 PM  Radiology DG Chest 2 View  Result Date: 08/27/2021 CLINICAL DATA:  Productive cough with nasal congestion for 2 months EXAM: CHEST - 2 VIEW COMPARISON:  11/06/2019 FINDINGS: Cardiomegaly and aortic tortuosity. Low volume chest. There is no edema, consolidation, effusion, or pneumothorax. IMPRESSION: Stable from prior.  No acute finding. Cardiomegaly. Electronically Signed   By: Tiburcio Pea M.D.   On: 08/27/2021 10:49  Procedures Procedures   Medications Ordered in ED Medications  ipratropium-albuterol (DUONEB) 0.5-2.5 (3) MG/3ML nebulizer solution 3 mL (3 mLs Nebulization Given 08/27/21 1252)  potassium chloride SA (KLOR-CON M) CR tablet 40 mEq (40 mEq Oral Given 08/27/21 1544)    ED Course/ Medical Decision Making/ A&P Clinical Course as of 08/30/21 1148  Sat Aug 27, 2021  1110 DG Chest 2 View [RR]    Clinical Course User Index [RR] Achille Rich, PA-C                           Medical Decision Making Amount and/or Complexity of Data Reviewed Labs: ordered. Radiology: ordered. Decision-making details documented in ED Course.  Risk Prescription drug management.   80 year old female presents the emergency room for evaluation of cough and cold symptoms for the past 2 months.  Differential diagnosis includes but limited to pneumonia, bronchitis, COPD, asthma, seasonal allergies.  Vital signs are unremarkable.  Physical exam as noted above.  Patient is in no acute distress and is well-appearing.  Given her expiratory wheezing, will order DuoNeb.  Labs and imaging ordered as well.  CMP shows mildly decreased potassium at 3.2 and mildly elevated glucose at 107 although patient not  fasting.  No other electrolyte or LFT abnormalities.  CBC shows slight hemoconcentration at 15.6, which is consistent with her previous hemoconcentration from last year.  Troponin at 10.  Respiratory panel negative for COVID and flu.  Chest x-ray shows no acute finding.  Stable from prior.  There is some cardiomegaly and aortic aortic tortuosity. Low volume chest. There is no edema, consolidation, effusion, or pneumothorax.   On reevaluation, patient's lung sounds have improved after the DuoNeb treatment.  I did replenish her potassium.  My attending assessed at bedside, did not recommend any additional interventions.  Will discharge home with treatment for pneumonia.  I doubt any ACS given the patient's chest tightness only for the last 2 weeks which is likely associated from her coughing.  We will send the patient home with an albuterol inhaler, prednisone burst and azithromycin.  Patient reports that she is feeling much better after the DuoNeb, discussed with her the treatment for prednisone and azithromycin to help with this possible atypical pneumonia from seasonal allergies.  Would like for her to follow-up with her primary care doctor in the next week.  We discussed return precautions and red flag symptoms.  Patient verbalized understanding and agrees to the plan.  Patient is stable being discharged home in good condition.  I discussed this case with my attending physician who cosigned this note including patient's presenting symptoms, physical exam, and planned diagnostics and interventions. Attending physician stated agreement with plan or made changes to plan which were implemented.   Attending physician assessed patient at bedside.  Final Clinical Impression(s) / ED Diagnoses Final diagnoses:  Subacute cough    Rx / DC Orders ED Discharge Orders          Ordered    albuterol (VENTOLIN HFA) 108 (90 Base) MCG/ACT inhaler  Every 6 hours PRN        08/27/21 1531    predniSONE  (DELTASONE) 20 MG tablet  Daily        08/27/21 1531    azithromycin (ZITHROMAX) 250 MG tablet  Daily        08/27/21 1531              Achille Rich, PA-C 08/30/21 1157    Kneller,  Cecile Sheerer, DO 09/08/21 [phone]

## 2021-08-27 NOTE — Discharge Instructions (Addendum)
You were seen in the emergency department for evaluation of your cough.  Your lab work and imaging were reassuring.  I likely think you have some sort of bronchitis or pneumonia.  For this, we will give you albuterol inhaler to use as needed for your cough.  You will take azithromycin which is an antibiotic daily.  Please follow the instructions listed on the bottle.  Also, I have sent you home with a prednisone burst to take as directed.  If you have any chest pain, worsening cough, coughing up any blood, shortness of breath, fever, please return to the nearest emergency department for evaluation. Follow up with your PCP.  Contact a doctor if: You have new symptoms. You cough up pus. Your cough does not get better after 2-3 weeks, or your cough gets worse. Cough medicine does not help your cough and you are not sleeping well. You have pain that gets worse or pain that is not helped with medicine. You have a fever. You are losing weight and you do not know why. You have night sweats. Get help right away if: You cough up blood. You have trouble breathing. Your heartbeat is very fast. These symptoms may be an emergency. Do not wait to see if the symptoms will go away. Get medical help right away. Call your local emergency services (911 in the [place] Do not drive yourself to the hospital.

## 2021-08-27 NOTE — ED Triage Notes (Signed)
Pt presents with c/o cough for approx 2 months with nasal congestion.

## 2021-08-31 NOTE — Assessment & Plan Note (Signed)
Evaluated in ED 08/27/2021.  CXR negative.  VS normal.  Wheezing noted exam, relieved with DuoNeb treatment.  Discharged with bronchodilator, prednisone, and azithromycin.

## 2021-08-31 NOTE — Progress Notes (Signed)
[name] is an 81 yo independently living woman who is here for ED f/u.  My last visit with her was on 06/23/2021.  She was seen by one of my partners in the [facility] on 07/07/2021 for acute bronchitis, and was seen in the ED on 08/27/2021 for persistent ry cough. On 8/26, she reported to the ED staff that she had experienced a productive cough (she shared with me that it was nonproductive) for the prior 2 months (she told me "at least a month" associated with rhinorrhea and nasal congestion.  OTC medications had not relieved her symptoms.  Mild expiratory wheezing was noted over her lower bases, pharyngeal exam was normal.  CXR revealed no acute findings.  She was treated with a DuoNeb and potassium chloride 40 mEq was given for mildly low potassium.  EKG was notable for A-fib, chronic.  She was discharged with an albuterol inhaler, prednisone burst, and azithromycin.  She endorsed relief of symptoms with a DuoNeb tx.  Feeling very weak, "needs a vitamin or something for energy". Aware that she had wheezing, felt much better after treatment, cough loosened.  Breathing feels better, the albuterol helps. Completed the azithromycin and prednisone, and continues to have the albuterol inhaler prn.  Feels like her head congestion needs to loosen.  Having problems with with L eye lid - was to have been referred to a neurologist with expertise in lid ennervation - the surgery hasn't helped as much as was hoped.   Physical exam  BP (!) 142/97 (BP Location: Right Arm, Patient Position: Sitting, Cuff Size: Small)   Pulse 75   Temp 97.9 F (36.6 C) (Oral)   Ht 5\' 4"  (1.626 m)   Wt 198 lb 6.4 oz (90 kg)   SpO2 100%   BMI 34.06 kg/m   Nicely dressed and groomed, mildly hoarse voice.  Normal get up and go test, excellent pace.  Chronic L ptosis present (being evaluated by ophthalmologist, referred to neurologist for second opinion).  Dentures present. Buccal mucosa with scattered bilateral small white patches, some of  which do not scrape off.  Tongue with geographic variant of normal.  Oropharynx with erythematous tonsillar pillars, no exudate; cobblestoning posteriorly.  No JVD. Lungs clear except for bibasilar end inspratory soft rales; no wheezing, nonlabored breathing.  No JVD.  Heart irreg irreg. Systolic murmur at base and LLSB unchanged.  PUffy ankles.No LE edema.  DP  pulses 2+.  Feet in good repair, nails trimmed.  Skin turgor normal.   Assessment and plan: Osteoporosis, post-menopausal DEXA is scheduled for 12/2021.  Hypovitaminosis D was identified at last visit (level 16).  Will replete with 2000 IU daily.  Tolerating alendronate.    Vitamin D deficiency Level 16 in 06/2021.  Begin VD3 3000 IU daily.  Bronchitis with acute wheezing Evaluated in ED 08/27/2021.  CXR negative.  VS normal.  Wheezing noted exam, relieved with DuoNeb treatment.  Discharged with bronchodilator, prednisone, and azithromycin.  Stress due to illness of family member She is doing much better with the stress, settling in to her new life.  Will resolve this problem soon.  Oral candidiasis Buccal mucosa with scattered bilateral small white patches, some of which do not scrape off.  Tongue with geographic variant of normal.  Oropharynx with erythematous tonsillar pillars, no exudate; cobblestoning posteriorly.  Risk factors; recent oral prednisone and antibiotic, dentures.  Clotrimazole troches.

## 2021-08-31 NOTE — Patient Instructions (Incomplete)
Ms. [name], It was nice to see you today, and I'm pleased that you're feeling better.  You will be taking a vitamin D tablet each day going forward, to help your bones remain strong.  I see that your bone scan (DEXA) is scheduled for 12/2021.  Thank you for doing that.  Let's get together in 3 months to stay on top of your chronic conditions. Take care and stay well, Dr. Mayford Knife

## 2021-08-31 NOTE — Assessment & Plan Note (Signed)
DEXA is scheduled for 12/2021.  Hypovitaminosis D was identified at last visit (level 16).  Will replete with 2000 IU daily.  Tolerating alendronate.

## 2021-08-31 NOTE — Assessment & Plan Note (Addendum)
Level 16 in 06/2021.  Begin VD3 3000 IU daily.

## 2021-09-01 ENCOUNTER — Encounter: Payer: Self-pay | Admitting: Internal Medicine

## 2021-09-01 ENCOUNTER — Ambulatory Visit (INDEPENDENT_AMBULATORY_CARE_PROVIDER_SITE_OTHER): Payer: Medicare Other | Admitting: Internal Medicine

## 2021-09-01 VITALS — BP 142/97 | HR 75 | Temp 97.9°F | Ht 64.0 in | Wt 198.4 lb

## 2021-09-01 DIAGNOSIS — I10 Essential (primary) hypertension: Secondary | ICD-10-CM | POA: Diagnosis not present

## 2021-09-01 DIAGNOSIS — B370 Candidal stomatitis: Secondary | ICD-10-CM

## 2021-09-01 DIAGNOSIS — N904 Leukoplakia of vulva: Secondary | ICD-10-CM

## 2021-09-01 DIAGNOSIS — J40 Bronchitis, not specified as acute or chronic: Secondary | ICD-10-CM | POA: Diagnosis not present

## 2021-09-01 DIAGNOSIS — K141 Geographic tongue: Secondary | ICD-10-CM

## 2021-09-01 DIAGNOSIS — H02402 Unspecified ptosis of left eyelid: Secondary | ICD-10-CM | POA: Diagnosis not present

## 2021-09-01 DIAGNOSIS — R059 Cough, unspecified: Secondary | ICD-10-CM | POA: Diagnosis not present

## 2021-09-01 DIAGNOSIS — M810 Age-related osteoporosis without current pathological fracture: Secondary | ICD-10-CM

## 2021-09-01 DIAGNOSIS — E559 Vitamin D deficiency, unspecified: Secondary | ICD-10-CM

## 2021-09-01 DIAGNOSIS — I483 Typical atrial flutter: Secondary | ICD-10-CM

## 2021-09-01 DIAGNOSIS — Z6379 Other stressful life events affecting family and household: Secondary | ICD-10-CM | POA: Diagnosis not present

## 2021-09-01 MED ORDER — VITAMIN D3 1.25 MG (50000 UT) PO TABS: 50000.0000 [IU] | ORAL_TABLET | ORAL | 0 refills | Status: DC

## 2021-09-01 MED ORDER — CLOTRIMAZOLE 10 MG MT TROC: 10.0000 mg | Freq: Every day | OROMUCOSAL | 0 refills | Status: AC

## 2021-09-01 MED ORDER — ALENDRONATE SODIUM 70 MG PO TABS: 70.0000 mg | ORAL_TABLET | ORAL | 3 refills | Status: DC

## 2021-09-01 MED ORDER — AMLODIPINE BESYLATE 10 MG PO TABS: 10.0000 mg | ORAL_TABLET | Freq: Every day | ORAL | 3 refills | Status: AC

## 2021-09-01 MED ORDER — BETAMETHASONE DIPROPIONATE AUG 0.05 % EX OINT: TOPICAL_OINTMENT | Freq: Every day | CUTANEOUS | 11 refills | Status: AC

## 2021-09-01 MED ORDER — FLUTICASONE PROPIONATE 50 MCG/ACT NA SUSP: NASAL | 5 refills | Status: AC

## 2021-09-01 MED ORDER — APIXABAN 5 MG PO TABS: 5.0000 mg | ORAL_TABLET | Freq: Two times a day (BID) | ORAL | 3 refills | Status: AC

## 2021-09-01 MED ORDER — HYDROCHLOROTHIAZIDE 25 MG PO TABS: 25.0000 mg | ORAL_TABLET | Freq: Every day | ORAL | 3 refills | Status: AC

## 2021-09-03 NOTE — Assessment & Plan Note (Signed)
Buccal mucosa with scattered bilateral small white patches, some of which do not scrape off.  Tongue with geographic variant of normal.  Oropharynx with erythematous tonsillar pillars, no exudate; cobblestoning posteriorly.  Risk factors; recent oral prednisone and antibiotic, dentures.  Clotrimazole troches.

## 2021-09-03 NOTE — Assessment & Plan Note (Signed)
She is doing much better with the stress, settling in to her new life.  Will resolve this problem soon.

## 2021-09-08 NOTE — Progress Notes (Unsigned)
Initial neurology clinic note  SERVICE DATE: 09/14/21 SERVICE TIME: 9:30 am  Reason for Evaluation: Consultation requested by Jonnie Finner, MD for an opinion regarding ptosis of left eyelid. My final recommendations will be communicated back to the requesting physician by way of shared medical record or letter to requesting physician via [place] mail.  HPI: This is Ms. [name], a 81 y.o. right-handed female with a medical history of left ptosis s/p repair (03/2021), HTN, Afib (on Eliquis), GERD, anxiety, and vit D deficiency who presents to neurology clinic with the chief complaint of left eyelid drooping. The patient is alone today.  Patient has had symptoms for decades. As she has grown older, her symptoms have gotten worse. It has always been left eyelid drooping. It is never the right eyelid. It obstructs her vision. She thinks there may be some fluctuations. She is not sure if it is worse in the morning. She thinks the drooping may get worse if she is trying harder.  Current MG like symptoms: Ptosis: yes, left eyelid, see above Double vision: never Speech: no problems Chewing: no problems Swallowing: sometimes if she drinks a lot of water, it feels like she is "losing her breath". No clear choking. Breathing: recent cold and needs an inhaler, but otherwise okay Arm strength: had pain in right shoulder but has since resolved. No current weakness. Leg strength: had bilateral knee surgery which has helped her walking. No recent falls.  There are no complaints relating to other symptoms of small fiber modalities including paresthesia/pain.  The patient report any constitutional symptoms like fever, night sweats, anorexia or unintentional weight loss.  EtOH use: None  Restrictive diet? No Family history of neuropathy/myopathy/NM disease? Patient's father had a "weak eye" as well that would droop down.   She has not had an EMG. She is not currently on any medications for  ptosis.  Patient is referred by Dr. Delynn Flavin of [facility] for fluctuating ptosis of left upper eyelid and blurred vision in the left eye. She had left upper eyelid external ptosis repair on 03/23/21. At follow up on 06/16/21, patient mentioned fluctuations in eyelid height. She also reported worse vision in left eye compared to right eye and dry eyes. AChR abs (binding, blocking, and modulating) and MuSK abs were negative.   MEDICATIONS:  Outpatient Encounter Medications as of 09/14/2021  Medication Sig   albuterol (VENTOLIN HFA) 108 (90 Base) MCG/ACT inhaler Inhale 1-2 puffs into the lungs every 6 (six) hours as needed for wheezing or shortness of breath.   alendronate (FOSAMAX) 70 MG tablet Take 1 tablet (70 mg total) by mouth once a week. Take with a full glass of water on an empty stomach.   amLODipine (NORVASC) 10 MG tablet Take 1 tablet (10 mg total) by mouth daily.   apixaban (ELIQUIS) 5 MG TABS tablet Take 1 tablet (5 mg total) by mouth 2 (two) times daily.   augmented betamethasone dipropionate (DIPROLENE-AF) 0.05 % ointment Apply topically daily.   Cholecalciferol (VITAMIN D3) 1.25 MG (50000 UT) TABS Take 50,000 Units by mouth once a week for 4 doses.   fluticasone (FLONASE) 50 MCG/ACT nasal spray SHAKE LIQUID AND USE 1 SPRAY IN EACH NOSTRIL DAILY   guaiFENesin-dextromethorphan (ROBITUSSIN DM) 100-10 MG/5ML syrup Take 5 mLs by mouth every 4 (four) hours as needed for cough.   hydrochlorothiazide (HYDRODIURIL) 25 MG tablet Take 1 tablet (25 mg total) by mouth daily.   potassium chloride SA (KLOR-CON M) 20 MEQ tablet Take  1 tablet (20 mEq total) by mouth daily.   [EXPIRED] clotrimazole (MYCELEX) 10 MG troche Take 1 tablet (10 mg total) by mouth 5 (five) times daily for 10 days. Allow each tablet to fully dissolve in your mouth before swallowing.  Do not chew or swallow the tablet whole.   No facility-administered encounter medications on file as of 09/14/2021.    PAST MEDICAL  HISTORY: Past Medical History:  Diagnosis Date   Aortic stenosis    mild to moderate AS with mean AVG 17mmHg by echo 04/2020   Atrial flutter (HCC) 05/07/2014   With controlled ventricular rate on no AV nodal agents.   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   Rigid esophagoscopy, dilation, and Botox injection into cricopharyngeus muscle recommended    Essential hypertension    takes Amlodipine daily   GERD (gastroesophageal reflux disease)    takes Pantoprazole daily   History of blood transfusion    no abnormal reaction noted   History of migraine    last one a week   History of shingles    Hypertensive retinopathy of both eyes 03/07/2016   Moderate   Mitral valve regurgitation, rheumatic 05/05/2014   Moderate to severe with peak PA pressure of 56 mmHg on Echo 04/2014    Muscle spasm of both lower legs    takes Zanaflex daily as needed   Osteoarthritis    knees and hands   Peripheral edema    takes Lasix daily as needed    PONV (postoperative nausea and vomiting)    with recent dilatation of throat, had N/V   Systolic murmur    per patient report she has had rheumatic fever in childhood   Thoracic aortic atherosclerosis (HCC) 10/08/2019   Radiographic on CT chest 2020    PAST SURGICAL HISTORY: Past Surgical History:  Procedure Laterality Date   ABDOMINAL HYSTERECTOMY     Fibroids   COLONOSCOPY W/ BIOPSIES AND POLYPECTOMY     CYST EXCISION Right    on hand   ESOPHAGOSCOPY WITH DILITATION N/A 04/14/2014   Procedure: RIGID ESOPHAGOSCOPY WITH DILITATION AND BOTOX  INJECTION;  Surgeon: Osborn Cohoavid Shoemaker, MD;  [facility];  Service: ENT;  Laterality: N/A;   MULTIPLE TOOTH EXTRACTIONS     TOTAL KNEE ARTHROPLASTY Right 04/06/2015   TOTAL KNEE ARTHROPLASTY Right 04/06/2015   Procedure: RIGHT TOTAL KNEE ARTHROPLASTY;  Surgeon: Marcene CorningPeter Dalldorf, MD;  Location: [facility] OR;  Service: Orthopedics;   Laterality: Right;   TOTAL KNEE ARTHROPLASTY Left 09/14/2015   Procedure: TOTAL KNEE ARTHROPLASTY LEFT;  Surgeon: Marcene CorningPeter Dalldorf, MD;  Location: [facility] OR;  Service: Orthopedics;  Laterality: Left;    ALLERGIES: Allergies  Allergen Reactions   Ace Inhibitors Cough    FAMILY HISTORY: Family History  Problem Relation Age of Onset   Cancer Mother        Unknown type   Stroke Father    Unexplained death Sister    Heart attack Sister    Other Brother        Unknown health   Healthy Daughter    Healthy Son    Asthma Sister    Other Sister        Unknown health   Other Brother        Unknown health   Other Brother        Unknown health   Cancer Brother        Unknown  type   Cancer Brother        Unknown type   Other Brother        Accident   Other Brother        Industrial accident   Healthy Daughter    Healthy Daughter     SOCIAL HISTORY: Social History   Tobacco Use   Smoking status: Never   Smokeless tobacco: Never  Vaping Use   Vaping Use: Never used  Substance Use Topics   Alcohol use: No   Drug use: No   Social History   Social History Narrative   Current Social History 03/02/2020        Patient lives with spouse in a/an home / condo / townhome which is 1 story/stories. There are not steps up to the entrance the patient uses.       Patient's method of transportation is personal car.      The highest level of education was junior high / middle school.      The patient currently retired.      Identified important Relationships are God, husband, kids       Pets : none       Interests / Fun: "Walking into stores and looking around.  Riding around"       Current Stressors: "Bills, the war"       Religious / Personal Beliefs:        Other:  Right handed Caffeine 1 cup and 1 soda daily   Live in one level     OBJECTIVE: PHYSICAL EXAM: BP 137/81   Pulse 76   Ht 5\' 4"  (1.626 m)   Wt 198 lb (89.8 kg)   SpO2 98%   BMI 33.99 kg/m    General: General appearance: Awake and alert. No distress. Cooperative with exam.  Skin: No obvious rash or jaundice. HEENT: Atraumatic. Anicteric. Lungs: Non-labored breathing on room air  Extremities: No edema. No obvious deformity.  Musculoskeletal: No obvious joint swelling. Psych: Affect appropriate.  Neurological: Mental Status: Alert. Speech fluent. No pseudobulbar affect Cranial Nerves: CNII: No RAPD. Visual fields intact. CNIII, IV, VI: PERRL. No nystagmus. EOMI. CN V: Facial sensation intact bilaterally to fine touch. Masseter clench strong. CN VII: Orbicularis oris strong. Orbicularis oculi weak. Left sided ptosis.Ptosis improved with sustained up gaze. Ice pack test negative (ptosis was worse after ice). Cogan lid twitch sign negative. Lifting the left eyelid did not bring out ptosis on right. CN VIII: Hears finger rub well bilaterally. CN IX: No hypophonia. CN X: Palate elevates symmetrically. CN XI: Full strength shoulder shrug bilaterally. CN XII: Tongue protrusion full and midline. No atrophy or fasciculations. No significant dysarthria Motor: Tone is normal. No fasciculations in extremities. No atrophy.  Individual muscle group testing (MRC grade out of 5):  Movement     Neck flexion 5    Neck extension 5     Right Left   Shoulder abduction 5 5 No definite fatigability (limited testing on right due to shoulder pain)  Shoulder adduction 5 5   Shoulder ext rotation 5 5   Shoulder int rotation 5 5   Elbow flexion 5 5   Elbow extension 5 5   Wrist extension 5 5   Wrist flexion 5 5   Finger abduction - FDI 5 5   Finger abduction - ADM 5 5   Finger extension 5 5   Finger distal flexion - 2/3 5 5    Finger distal flexion - 4/5 5  5   Thumb flexion - FPL 5 5   Thumb abduction - APB 4+ 4+    Hip flexion 5 5   Hip extension 5 5   Knee extension 5 5   Knee flexion 5 5   Dorsiflexion 5 5   Plantarflexion 5 5     Reflexes:  Right Left   Bicep 1+ 1+    Tricep 1+ 1+   BrRad 1+ 1+   Knee 2+ 2+   Ankle Trace Trace    Pathological Reflexes: Babinski: mute response bilaterally Hoffman: absent bilaterally Troemner: absent bilaterally  Sensation: Intact to light touch Coordination: Intact finger-to- nose-finger bilaterally. Romberg negative. Gait: Able to rise from chair with arms crossed unassisted. Normal, narrow-based gait.  Lab and Test Review: Internal labs: Normal or unremarkable: CBC CMP significant for mildly elevated glucose and hypokalemia Vit D: Low at 16.1 B12 (05/28/19): 958 TSH (05/28/19): 1.370  External labs: 12/29/20: AChR abs (binding, blocking, modulating) - negative MuSK abs: negative  CT chest (10/08/18): FINDINGS: Cardiovascular: Atherosclerotic calcification of the aorta, aortic valve and coronary arteries. Pulmonic trunk and heart are enlarged. No pericardial effusion.   Mediastinum/Nodes: No pathologically enlarged mediastinal or axillary lymph nodes. Hilar regions are difficult to definitively evaluate without IV contrast but appear grossly unremarkable. Esophagus is grossly unremarkable.   Lungs/Pleura: Negative for subpleural reticulation, traction bronchiectasis/bronchiolectasis, ground-glass, architectural distortion or honeycombing. Mild scarring in the medial lower lobes. Expiratory phase imaging was not performed in true expiration, limiting the evaluation for air trapping. No pleural fluid. Airway is unremarkable.   Upper Abdomen: Visualized portion of the liver is unremarkable. Stones in the gallbladder are partially imaged. Visualized portions of the adrenal glands, kidneys, spleen, pancreas, stomach and bowel are unremarkable with exception of a small hiatal hernia. Upper abdominal lymph nodes are not enlarged by CT size criteria.   Musculoskeletal: Degenerative changes in the spine. No worrisome lytic or sclerotic lesions.   IMPRESSION: 1. No evidence of interstitial lung disease.  No pulmonary parenchymal findings to explain the patient's chronic cough. 2. Cholelithiasis. 3. Aortic atherosclerosis (ICD10-170.0). Coronary artery calcification. 4. Enlarged pulmonic trunk, indicative of pulmonary arterial hypertension.  Chest xray (08/27/21): FINDINGS: Cardiomegaly and aortic tortuosity. Low volume chest. There is no edema, consolidation, effusion, or pneumothorax.   IMPRESSION: Stable from prior.  No acute finding. Cardiomegaly.  ASSESSMENT: [name] is a 81 y.o. female who presents for evaluation of left sided ptosis. She has a relevant medical history of left sided ptosis s/p repair (03/2021), HTN, Afib (on Eliquis), GERD, anxiety, and vit D deficiency. Her neurological examination is pertinent for non-fatigable left side ptosis and eye closure weakness bilaterally. Ice pack test, Cogan lid twitch, and sustained up gaze for diplopia or worsening ptosis were all negative. In fact, her ptosis improved with up gaze. Available diagnostic data is significant for negative antibodies to AChR (binding, blocking, and modulating) and MuSK.   The cause of patient's ptosis is not currently clear. While myasthenia gravis is on the differential, her symptoms do not match well. This may be isolated weakness of the levator palpebrae and perhaps worse after ptosis repair.  PLAN: -Blood work: TSH -Discussed EMG and single fiber EMG. EMG with repetitive nerve stimulation is likely to be helpful as there are no clear generalized signs. Also, with patient on eliquis, some proximal muscles that should be examined by needle, would not be able to be tested safely. Single fiber EMG would be the most sensitive test, but patient is  not sure she would have a ride to [facility] for single fiber EMG. -Patient to discuss with family about a possible ride to [facility] for single fiber. If she is unable, will likely offer a trial of mestinon to see if this helps with ptosis. -I will be in  touch with patient next week to discuss further.  -Return to clinic in 3 months  The impression above as well as the plan as outlined below were extensively discussed with the patient who voiced understanding. All questions were answered to their satisfaction.  When available, results of the above investigations and possible further recommendations will be communicated to the patient via telephone/MyChart. Patient to call office if not contacted after expected testing turnaround time.   Total time spent reviewing records, interview, history/exam, documentation, and coordination of care on day of encounter:  60 min   Thank you for allowing me to participate in patient's care.  If I can answer any additional questions, I would be pleased to do so.  Jacquelyne Balint, MD   CC: Miguel Aschoff, MD [address] [place] [address]  CC: Referring provider: Jonnie Finner, MD [address] [place],  [place] [address]

## 2021-09-13 ENCOUNTER — Telehealth: Payer: Self-pay | Admitting: Internal Medicine

## 2021-09-13 NOTE — Telephone Encounter (Signed)
Return pt's call. States when she's asleep,feels like "a cold in her throat" and this wakes her up. States she's not coughing like before. She has not tried mucinex. Requesting medication. Thanks

## 2021-09-13 NOTE — Telephone Encounter (Signed)
Pt requesting a call back. Pt states she is having a problem sleeping.  Pt states she is still a little congested and wanting to know if she can get some medication called in.

## 2021-09-14 ENCOUNTER — Other Ambulatory Visit (INDEPENDENT_AMBULATORY_CARE_PROVIDER_SITE_OTHER): Payer: Medicare Other

## 2021-09-14 ENCOUNTER — Encounter: Payer: Self-pay | Admitting: Neurology

## 2021-09-14 ENCOUNTER — Ambulatory Visit: Payer: Medicare Other | Admitting: Neurology

## 2021-09-14 VITALS — BP 137/81 | HR 76 | Ht 64.0 in | Wt 198.0 lb

## 2021-09-14 DIAGNOSIS — H02402 Unspecified ptosis of left eyelid: Secondary | ICD-10-CM | POA: Diagnosis not present

## 2021-09-14 LAB — TSH: TSH: 1.41 u[IU]/mL (ref 0.35–5.50)

## 2021-09-14 NOTE — Patient Instructions (Signed)
I saw you today for the drooping of your left eye. The concern from your other doctor (Dr. Delynn Flavin) was that you may have a condition called myasthenia gravis. I am not sure currently.  I would like to get a lab test today that looks at your thyroid.  We talked about muscle and nerve testing called an EMG. The best one would be done at [facility]. You are not sure if you would have a ride, so I want you to discuss this with your friends and family.  I will call you next week to discuss this further and discuss other options we may have.  I would like you to schedule a 3 month follow up with me today before leaving as well.  The physicians and staff at [facility] are committed to providing excellent care. You may receive a survey requesting feedback about your experience at our office. We strive to receive "very good" responses to the survey questions. If you feel that your experience would prevent you from giving the office a "very good " response, please contact our office to try to remedy the situation. We may be reached at [phone]. Thank you for taking the time out of your busy day to complete the survey.  Jacquelyne Balint, MD [facility]

## 2021-09-20 ENCOUNTER — Telehealth: Payer: Self-pay | Admitting: Neurology

## 2021-09-20 DIAGNOSIS — H02402 Unspecified ptosis of left eyelid: Secondary | ICD-10-CM

## 2021-09-20 MED ORDER — PYRIDOSTIGMINE BROMIDE 60 MG PO TABS: 60.0000 mg | ORAL_TABLET | Freq: Three times a day (TID) | ORAL | 0 refills | Status: DC

## 2021-09-20 NOTE — Telephone Encounter (Signed)
Called patient and she confirmed that she would be unable to get a ride to have a single fiber EMG at [facility]. We discussed and will proceed with a 2 week trial of mestinon. I sent the prescription to her pharmacy. She will monitor ptosis while on mestinon.  I will check in with her next week to see if this has helped her ptosis. All questions were answered.  Kai Levins, MD [facility]

## 2021-09-29 ENCOUNTER — Telehealth: Payer: Self-pay | Admitting: Neurology

## 2021-09-29 NOTE — Telephone Encounter (Signed)
Called patient to discuss her symptoms now that she has been on Mestinon for over a week. Per patient, she has not noticed any change in her eye lid drooping.  She will continue taking the 2 week trial and call when she completes the course to discuss if she saw any change.  Given the current non-response, this makes myasthenia gravis less likely the cause of patient's ptosis.  All questions were answered.  Kai Levins, MD [facility]

## 2021-10-07 ENCOUNTER — Other Ambulatory Visit: Payer: Self-pay | Admitting: Neurology

## 2021-10-07 DIAGNOSIS — H02402 Unspecified ptosis of left eyelid: Secondary | ICD-10-CM

## 2021-11-17 ENCOUNTER — Ambulatory Visit (INDEPENDENT_AMBULATORY_CARE_PROVIDER_SITE_OTHER): Payer: Medicare Other | Admitting: Internal Medicine

## 2021-11-17 DIAGNOSIS — J40 Bronchitis, not specified as acute or chronic: Secondary | ICD-10-CM | POA: Diagnosis not present

## 2021-11-17 DIAGNOSIS — R4789 Other speech disturbances: Secondary | ICD-10-CM | POA: Diagnosis not present

## 2021-11-17 DIAGNOSIS — Z5941 Food insecurity: Secondary | ICD-10-CM | POA: Diagnosis not present

## 2021-11-17 DIAGNOSIS — I4891 Unspecified atrial fibrillation: Secondary | ICD-10-CM

## 2021-11-17 DIAGNOSIS — I4892 Unspecified atrial flutter: Secondary | ICD-10-CM | POA: Diagnosis not present

## 2021-11-17 DIAGNOSIS — R053 Chronic cough: Secondary | ICD-10-CM | POA: Diagnosis not present

## 2021-11-17 MED ORDER — ALBUTEROL SULFATE HFA 108 (90 BASE) MCG/ACT IN AERS: 1.0000 | INHALATION_SPRAY | Freq: Three times a day (TID) | RESPIRATORY_TRACT | 5 refills | Status: AC | PRN

## 2021-11-17 NOTE — Progress Notes (Signed)
Stress confusion problems concentrating are over. Feels weight lifted 50 years marriage. Dtr in town helpful throughout , other dtr[place] of state[place]  Physically she is feeling well with no complaints.  Husband  passed away at the NH peacefully.  Nighttime cough, phlem gets caught, can't clear it.  Can feel it, like a wheeze; sister, an Char[employer] fundraiser, told her to get a pill which seemed to help -  mucinex DM - not.  Nose congestion is ok, no ST, no illness.    Sounds hoarse.    When lays down feels aching in the legs, takes tylenol at night.    Word finding problems  Sleeping and eating ok  Not

## 2021-11-17 NOTE — Progress Notes (Signed)
[facility] Telephone Encounter Continuity Care Appointment  HPI:  This telephone encounter was created for Ms. [name] on 11/17/2021 for the following purpose/cc of routine f/u.  She was unable to keep her in person appointment as she is waiting on an important phone call.  Her big news is that her husband passed away peacefully at the nursing home after a long illness.  This led to immediate relief of stress, improvement in her insomnia, and problems with concentration and feeling confused.  "I feel like a weight has been lifted".  A daughter in town has been helpful throughout, and her out-of-state daughters remain supportive by phone.  Physically she is feeling well with no new complaints with the exception of cold symptoms which has "settled in my chest".  She has a cough emanating from her upper chest and is having difficulty mobilizing sputum despite Robitussin DM.  The cough is particularly concerning to her at night, when she feels as though she may "strangle" on the phlegm.  She has no difficulty breathing, no sore throat, no ear discomfort, and no change in her chronic nasal congestion.  No fevers or chills and she does not feel systemically ill.  Sleeping and eating ok (though she doesn't eat full meals; prefers to "nibble' through the day.  Assessment and plan  Atrial fibrillation and flutter (HCC) No subjective palpitations, fluttering, racing.  Asymptomatic.  Bronchitis with acute wheezing Cough and congestion without systemic symptoms and no dyspnea.  Didn't respond to guaiafenesin DM.  Sounds hoarse. Not drinking as much as she should be.  Plan guaiafenesin higher dose, oral hydration to help thin secretions, albuterol MDI for wheeze (she has a hx of same).  If symptoms persist, azithromycin for acute bronchitis may be helpful.  She has had these symptoms before.    Word finding difficulty Incidentally noticed during my phone  conversation with her today.  I will follow-up with cognitive screening at a future appointment.  Food insecurity, mild Does not receive food stamps.  Does not eat full meals and does not wish to enroll in a meal program such as Meals on Wheels.  Queries whether her [facility] card will help pay for occasional food items from the grocery store.  She had received advice prior to this call to contact [facility].  Ms. Barretto voices being very independent and is not at a point where she would need to ask her children for food assistance.       Patient Active Problem List   Diagnosis Date Noted   Oral candidiasis 09/01/2021   Geographic tongue, normal variant 09/01/2021   Bronchitis with acute wheezing 07/07/2021   Coronary artery calcification seen on CAT scan 06/23/2021   Ptosis, left eyelid 06/23/2021   Post-nasal drainage 02/10/2021   Stress due to illness of family member 11/18/2020   Aortic stenosis    Thoracic aortic atherosclerosis (HCC) 10/08/2019   Hypercoagulability due to atrial fibrillation (HCC) 09/20/2018   Chronic cough 06/07/2017   Hypertensive retinopathy of both eyes 03/07/2016   Lichen sclerosus of female genitalia 02/05/2015   Chronic constipation 11/06/2014   Atrial fibrillation and flutter (HCC) 05/07/2014   Chronic rheumatic heart disease 05/05/2014   Mitral valve regurgitation, rheumatic 05/05/2014   Cricopharyngeal achalasia 04/08/2014   Vitamin D deficiency 03/25/2014   Sigmoid diverticulosis 03/16/2014   Internal hemorrhoids 03/16/2014   Obesity, Class I, BMI 30-34.9 04/26/2012   Postmenopausal atrophic vaginitis 05/10/2011   Osteoporosis, post-menopausal 10/21/2010   Primary  osteoarthritis involving multiple joints 01/12/2006   Essential hypertension 11/20/2005    Assessment / Plan / Recommendations:  Please see A&P under problem oriented charting for assessment of the patient's acute and chronic medical conditions.  As always, pt is advised that if symptoms worsen or  new symptoms arise, they should go to an urgent care facility or to to ER for further evaluation.   Consent and Medical Decision Making:   This is a telephone encounter between [employer] and Miguel Aschoff on 11/17/2021 for routine f/u. The visit was conducted with the patient located at home and Miguel Aschoff at [facility]. The patient's identity was confirmed using their DOB and current address. The patient has consented to being evaluated through a telephone encounter and understands the associated risks (an examination cannot be done and the patient may need to come in for an appointment) / benefits (allows the patient to remain at home, decreasing exposure to coronavirus). I personally spent 15 minutes on medical discussion.

## 2021-11-21 ENCOUNTER — Encounter: Payer: Self-pay | Admitting: Internal Medicine

## 2021-11-21 DIAGNOSIS — Z5941 Food insecurity: Secondary | ICD-10-CM | POA: Insufficient documentation

## 2021-11-21 DIAGNOSIS — R4789 Other speech disturbances: Secondary | ICD-10-CM | POA: Insufficient documentation

## 2021-11-21 MED ORDER — GUAIFENESIN ER 600 MG PO TB12: 600.0000 mg | ORAL_TABLET | Freq: Two times a day (BID) | ORAL | 0 refills | Status: AC

## 2021-11-21 NOTE — Assessment & Plan Note (Signed)
Incidentally noticed during my phone conversation with her today.  I will follow-up with cognitive screening at a future appointment.

## 2021-11-21 NOTE — Assessment & Plan Note (Signed)
Cough and congestion without systemic symptoms and no dyspnea.  Didn't respond to guaiafenesin DM.  Sounds hoarse. Not drinking as much as she should be.  Plan guaiafenesin higher dose, oral hydration to help thin secretions, albuterol MDI for wheeze (she has a hx of same).  If symptoms persist, azithromycin for acute bronchitis may be helpful.  She has had these symptoms before.

## 2021-11-21 NOTE — Assessment & Plan Note (Signed)
Does not receive food stamps.  Does not eat full meals and does not wish to enroll in a meal program such as Meals on Wheels.  Queries whether her [facility] card will help pay for occasional food items from the grocery store.  She had received advice prior to this call to contact [facility].  [name] voices being very independent and is not at a point where she would need to ask her children for food assistance.

## 2021-11-21 NOTE — Assessment & Plan Note (Signed)
No subjective palpitations, fluttering, racing.  Asymptomatic.

## 2021-11-29 ENCOUNTER — Telehealth: Payer: Self-pay

## 2021-11-29 NOTE — Telephone Encounter (Signed)
Patient called she stated she is still having a cough she is requesting a call back

## 2021-11-29 NOTE — Telephone Encounter (Signed)
RTC to patient  states has a cough.  Coughing up yellow mucous.  Does not feel she is getting up all of the mucous.  Would like to get an antibiotic if possible.  Also stated that Mucinex did not help.  No fevers.

## 2021-11-30 DIAGNOSIS — H52223 Regular astigmatism, bilateral: Secondary | ICD-10-CM | POA: Diagnosis not present

## 2021-11-30 DIAGNOSIS — H5213 Myopia, bilateral: Secondary | ICD-10-CM | POA: Diagnosis not present

## 2021-11-30 DIAGNOSIS — H02402 Unspecified ptosis of left eyelid: Secondary | ICD-10-CM | POA: Diagnosis not present

## 2021-11-30 DIAGNOSIS — H35033 Hypertensive retinopathy, bilateral: Secondary | ICD-10-CM | POA: Diagnosis not present

## 2021-11-30 NOTE — Telephone Encounter (Signed)
RTC patient informed that she will need to come in for an appointment prior to getting Antibiotic.  Patient understood and has been scheduled for an appointment at 3:15 PM tomorrow

## 2021-12-01 ENCOUNTER — Ambulatory Visit (INDEPENDENT_AMBULATORY_CARE_PROVIDER_SITE_OTHER): Payer: Medicare Other | Admitting: Student

## 2021-12-01 ENCOUNTER — Other Ambulatory Visit: Payer: Self-pay

## 2021-12-01 ENCOUNTER — Encounter: Payer: Self-pay | Admitting: Student

## 2021-12-01 ENCOUNTER — Ambulatory Visit (HOSPITAL_COMMUNITY)
Admission: RE | Admit: 2021-12-01 | Discharge: 2021-12-01 | Disposition: A | Discharge status: Home / Self Care | Payer: Medicare Other | Source: Ambulatory Visit | Attending: Student in an Organized Health Care Education/Training Program | Admitting: Student in an Organized Health Care Education/Training Program

## 2021-12-01 VITALS — BP 133/93 | HR 70 | Temp 98.1°F | Ht 65.0 in | Wt 192.7 lb

## 2021-12-01 DIAGNOSIS — J40 Bronchitis, not specified as acute or chronic: Secondary | ICD-10-CM

## 2021-12-01 DIAGNOSIS — R059 Cough, unspecified: Secondary | ICD-10-CM | POA: Diagnosis not present

## 2021-12-01 DIAGNOSIS — K220 Achalasia of cardia: Secondary | ICD-10-CM

## 2021-12-01 NOTE — Patient Instructions (Signed)
  Thank you, Ms.Carolynn Hannia Matchett, for allowing [place] to provide your care today. Today we discussed . . .  > Cough       -We are going to get a chest x-ray today to look for any signs of pneumonia.  We will not start any antibiotics today but we might if the chest x-ray shows any pneumonia.  Please continue to take the Mucinex as you were and use the albuterol and Flonase as well.  Please stop taking Afrin.  After you stop Afrin continue to use Flonase, this should help decrease the rebound congestion from Afrin.  Remember in the future to only use Afrin for 3 days at a time.  We will plan to see you back in 3 months.  We will call you with the results of the chest x-ray and make an appointment earlier if necessary.  If you have any worsening symptoms or new symptoms please do not hesitate to call our office and we will see you sooner.   I have ordered the following labs for you:  Lab Orders  No laboratory test(s) ordered today      Tests ordered today:  Chest Xray   Referrals ordered today:   Referral Orders  No referral(s) requested today      I have ordered the following medication/changed the following medications:   Stop the following medications: There are no discontinued medications.   Start the following medications: No orders of the defined types were placed in this encounter.     Follow up:  3 months, or sooner if needed     Remember:     Should you have any questions or concerns please call the internal medicine clinic at [phone].     Rocky Morel, DO [facility]

## 2021-12-01 NOTE — Progress Notes (Signed)
CC: Persistent cough  HPI:  [name] is a 81 y.o. female with PMH as below who presents to the clinic with complaints of persistent cough and congestion.  Please see encounters tab for problem-based charting.  Past Medical History:  Diagnosis Date   Aortic stenosis    mild to moderate AS with mean AVG by echo 04/2020   Atrial flutter (HCC) 05/07/2014   With controlled ventricular rate on no AV nodal agents.   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   Rigid esophagoscopy, dilation, and Botox injection into cricopharyngeus muscle recommended    Essential hypertension    takes Amlodipine daily   GERD (gastroesophageal reflux disease)    takes Pantoprazole daily   History of blood transfusion    no abnormal reaction noted   History of migraine    last one a week   History of shingles    Hypertensive retinopathy of both eyes 03/07/2016   Moderate   Mitral valve regurgitation, rheumatic 05/05/2014   Moderate to severe with peak PA pressure of 56 mmHg on Echo 04/2014    Muscle spasm of both lower legs    takes Zanaflex daily as needed   Osteoarthritis    knees and hands   Peripheral edema    takes Lasix daily as needed    PONV (postoperative nausea and vomiting)    with recent dilatation of throat, had N/V   Systolic murmur    per patient report she has had rheumatic fever in childhood   Thoracic aortic atherosclerosis (HCC) 10/08/2019   Radiographic on CT chest 2020   Review of Systems:   A comprehensive review of systems was negative except for: Cough, congestion    Physical Exam:  Vitals:   12/01/21 1509 12/01/21 1555  BP: 138/87 (!) 133/93  Pulse: 70   Temp: 98.1 F (36.7 C)   TempSrc: Oral   SpO2: 98%   Weight: 192 lb 11.2 oz (87.4 kg)   Height: 5\' 5"  (1.651 m)     Constitutional: Well-appearing elderly female. In no acute distress. HENT: Normocephalic,  atraumatic,  Eyes: Sclera non-icteric, EOM intact Cardio:Regular rate and rhythm. No murmurs, rubs, or gallops. 2+ bilateral radial pulses. Pulm:Clear to auscultation bilaterally. Normal work of breathing on room air. for extremity edema. Skin:Warm and dry. Neuro:Alert and oriented x3. No focal deficit noted. Psych:Pleasant mood and affect.   Assessment & Plan:   Cricopharyngeal achalasia Patient with persistent cough and congestion as described elsewhere complicated by cricopharyngeal achalasia.  Has in the past undergone rigid esophagoscopy with dilation and Botox injections.  Denies any issues swallowing recently.  Bronchitis with acute wheezing Patient with persistent cough and congestion that was evaluated about 10 days ago by Dr. OEU:MPNTIRWE.  Plan was to increase her guaifenesin, hydrate well and use albuterol inhaler as needed.  She feels like the cough has not gotten better and that her main issue she is not coughing enough up and feels like there is mucus getting stuck.  She denies any fevers, change in mucus production, change in mucus color.  Mucus is still yellow or whitish.  Of note she did stop taking her guaifenesin 2 days ago and has been using Afrin on and off for years and currently for 1 month now.  On exam there is minimal wheezing in her lungs and no stridor.  There does not appear to be an infectious  component at this time but with her history of cricopharyngeal achalasia and persistent cough we will get a chest x-ray to make sure. - Continue with guaifenesin and albuterol as needed - Stop Afrin    Patient seen with Dr. Julieanne Cotton, DO [facility] Internal Medicine Resident PGY-1 Pager: [phone]

## 2021-12-05 NOTE — Assessment & Plan Note (Signed)
Patient with persistent cough and congestion as described elsewhere complicated by cricopharyngeal achalasia.  Has in the past undergone rigid esophagoscopy with dilation and Botox injections.  Denies any issues swallowing recently.

## 2021-12-05 NOTE — Progress Notes (Signed)
Called and spoke to the patient the results of her chest x-ray.  Chest x-ray is unchanged from prior without evidence of pneumonia.  Patient is still having issues coughing up mucus but denies any worsening.  She has not been taking the Mucinex but has been taking Robitussin DM.  I advised her to take either Mucinex or Robitussin without the dextromethorphan and to see if it helps cough up the rest the mucus.  Patient was instructed to call the office back if she has any new or worsening symptoms.  Also advised to call the office later this week if she does not have any improvement.  May consider adding azithromycin at that point that she has failed conventional treatment.

## 2021-12-05 NOTE — Progress Notes (Signed)
Internal Medicine Clinic Attending  I saw and evaluated the patient.  I personally confirmed the key portions of the history and exam documented by Dr. Geraldo Pitter and I reviewed pertinent patient test results.  The assessment, diagnosis, and plan were formulated together and I agree with the documentation in the resident's note.   Issue with chronic non productive cough with intermittent exacerbations. I reviewed documentation from a few months of similar symptoms in 2022. I also do wonder about this history of cricopharyngeal achalasia requiring dilation with ENT. Could this cough be referred symptoms from esophageal disease? She may also have silent aspiration events. Or this could be prolonged reactive airway disease related to simple URI. So far not much improvement with supportive care. Chest xray is clear with no consolidation. I am considering a repeat DG esophagram. Further esophageal interventions may be challenging given her advanced age. I will discuss with her PCP.

## 2021-12-05 NOTE — Assessment & Plan Note (Signed)
Patient with persistent cough and congestion that was evaluated about 10 days ago by Dr. Mayford Knife.  Plan was to increase her guaifenesin, hydrate well and use albuterol inhaler as needed.  She feels like the cough has not gotten better and that her main issue she is not coughing enough up and feels like there is mucus getting stuck.  She denies any fevers, change in mucus production, change in mucus color.  Mucus is still yellow or whitish.  Of note she did stop taking her guaifenesin 2 days ago and has been using Afrin on and off for years and currently for 1 month now.  On exam there is minimal wheezing in her lungs and no stridor.  There does not appear to be an infectious component at this time but with her history of cricopharyngeal achalasia and persistent cough we will get a chest x-ray to make sure. - Continue with guaifenesin and albuterol as needed - Stop Afrin

## 2021-12-13 NOTE — Progress Notes (Unsigned)
I saw [name] in neurology clinic on 12/15/21 in follow up for ptosis of left eyelid.  HPI: [name] is a 81 y.o. year old female with a history of left ptosis s/p repair (03/2021), HTN, Afib (on Eliquis), GERD, anxiety, and vit D deficiency who we last saw on 09/14/21.  To briefly review: Patient has had symptoms for decades. As she has grown older, her symptoms have gotten worse. It has always been left eyelid drooping. It is never the right eyelid. It obstructs her vision. She thinks there may be some fluctuations. She is not sure if it is worse in the morning. She thinks the drooping may get worse if she is trying harder.   Current MG like symptoms: Ptosis: yes, left eyelid, see above Double vision: never Speech: no problems Chewing: no problems Swallowing: sometimes if she drinks a lot of water, it feels like she is "losing her breath". No clear choking. Breathing: recent cold and needs an inhaler, but otherwise okay Arm strength: had pain in right shoulder but has since resolved. No current weakness. Leg strength: had bilateral knee surgery which has helped her walking. No recent falls.   There are no complaints relating to other symptoms of small fiber modalities including paresthesia/pain.   The patient report any constitutional symptoms like fever, night sweats, anorexia or unintentional weight loss.   EtOH use: None  Restrictive diet? No Family history of neuropathy/myopathy/NM disease? Patient's father had a "weak eye" as well that would droop down.    She has not had an EMG. She is not currently on any medications for ptosis.   Patient is referred by Dr. Delynn Flavin of [facility] for fluctuating ptosis of left upper eyelid and blurred vision in the left eye. She had left upper eyelid external ptosis repair on 03/23/21. At follow up on 06/16/21, patient mentioned fluctuations in eyelid height. She also reported worse vision in left eye compared to  right eye and dry eyes. AChR abs (binding, blocking, and modulating) and MuSK abs were negative.  Most recent Assessment and Plan (09/14/21): Her neurological examination is pertinent for non-fatigable left side ptosis and eye closure weakness bilaterally. Ice pack test, Cogan lid twitch, and sustained up gaze for diplopia or worsening ptosis were all negative. In fact, her ptosis improved with up gaze. Available diagnostic data is significant for negative antibodies to AChR (binding, blocking, and modulating) and MuSK.    The cause of patient's ptosis is not currently clear. While myasthenia gravis is on the differential, her symptoms do not match well. This may be isolated weakness of the levator palpebrae and perhaps worse after ptosis repair.   PLAN: -Blood work: TSH -Discussed EMG and single fiber EMG. EMG with repetitive nerve stimulation is likely to be helpful as there are no clear generalized signs. Also, with patient on eliquis, some proximal muscles that should be examined by needle, would not be able to be tested safely. Single fiber EMG would be the most sensitive test, but patient is not sure she would have a ride to [facility] for single fiber EMG. -Patient to discuss with family about a possible ride to [facility] for single fiber. If she is unable, will likely offer a trial of mestinon to see if this helps with ptosis. -I will be in touch with patient next week to discuss further.  Since their last visit: Patient was unable to get transportation to [facility] for single fiber EMG. After discussion, we agreed to  a mestinon trial. After 2 weeks, patient did not appreciate a difference in her ptosis.  Current MG-like symptoms: Ptosis: same as previous, just on the left side Double vision: None Speech: None Chewing: None Swallowing: None Breathing: None Arm strength: None Leg strength: None  Patient has no new concerns today.   MEDICATIONS:  Outpatient Encounter Medications as  of 12/15/2021  Medication Sig   albuterol (VENTOLIN HFA) 108 (90 Base) MCG/ACT inhaler Inhale 1-2 puffs into the lungs 3 (three) times daily as needed for wheezing.   alendronate (FOSAMAX) 70 MG tablet Take 1 tablet (70 mg total) by mouth once a week. Take with a full glass of water on an empty stomach.   amLODipine (NORVASC) 10 MG tablet Take 1 tablet (10 mg total) by mouth daily.   apixaban (ELIQUIS) 5 MG TABS tablet Take 1 tablet (5 mg total) by mouth 2 (two) times daily.   augmented betamethasone dipropionate (DIPROLENE-AF) 0.05 % ointment Apply topically daily.   fluticasone (FLONASE) 50 MCG/ACT nasal spray SHAKE LIQUID AND USE 1 SPRAY IN EACH NOSTRIL DAILY   hydrochlorothiazide (HYDRODIURIL) 25 MG tablet Take 1 tablet (25 mg total) by mouth daily.   potassium chloride SA (KLOR-CON M) 20 MEQ tablet Take 1 tablet (20 mEq total) by mouth daily.   No facility-administered encounter medications on file as of 12/15/2021.    PAST MEDICAL HISTORY: Past Medical History:  Diagnosis Date   Aortic stenosis    mild to moderate AS with mean AVG 17mmHg by echo 04/2020   Atrial flutter (HCC) 05/07/2014   With controlled ventricular rate on no AV nodal agents.   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   Rigid esophagoscopy, dilation, and Botox injection into cricopharyngeus muscle recommended    Essential hypertension    takes Amlodipine daily   GERD (gastroesophageal reflux disease)    takes Pantoprazole daily   History of blood transfusion    no abnormal reaction noted   History of migraine    last one a week   History of shingles    Hypertensive retinopathy of both eyes 03/07/2016   Moderate   Mitral valve regurgitation, rheumatic 05/05/2014   Moderate to severe with peak PA pressure of 56 mmHg on Echo 04/2014    Muscle spasm of both lower legs    takes Zanaflex daily as needed   Osteoarthritis    knees  and hands   Peripheral edema    takes Lasix daily as needed    PONV (postoperative nausea and vomiting)    with recent dilatation of throat, had N/V   Systolic murmur    per patient report she has had rheumatic fever in childhood   Thoracic aortic atherosclerosis (HCC) 10/08/2019   Radiographic on CT chest 2020    PAST SURGICAL HISTORY: Past Surgical History:  Procedure Laterality Date   ABDOMINAL HYSTERECTOMY     Fibroids   COLONOSCOPY W/ BIOPSIES AND POLYPECTOMY     CYST EXCISION Right    on hand   ESOPHAGOSCOPY WITH DILITATION N/A 04/14/2014   Procedure: RIGID ESOPHAGOSCOPY WITH DILITATION AND BOTOX  INJECTION;  Surgeon: Osborn Cohoavid Shoemaker, MD;  [facility];  Service: ENT;  Laterality: N/A;   MULTIPLE TOOTH EXTRACTIONS     TOTAL KNEE ARTHROPLASTY Right 04/06/2015   TOTAL KNEE ARTHROPLASTY Right 04/06/2015   Procedure: RIGHT TOTAL KNEE ARTHROPLASTY;  Surgeon: Marcene CorningPeter Dalldorf, MD;  Location: [facility] OR;  Service:  Orthopedics;  Laterality: Right;   TOTAL KNEE ARTHROPLASTY Left 09/14/2015   Procedure: TOTAL KNEE ARTHROPLASTY LEFT;  Surgeon: Marcene Corning, MD;  Location: [facility] OR;  Service: Orthopedics;  Laterality: Left;    ALLERGIES: Allergies  Allergen Reactions   Ace Inhibitors Cough    FAMILY HISTORY: Family History  Problem Relation Age of Onset   Cancer Mother        Unknown type   Stroke Father    Unexplained death Sister    Heart attack Sister    Other Brother        Unknown health   Healthy Daughter    Healthy Son    Asthma Sister    Other Sister        Unknown health   Other Brother        Unknown health   Other Brother        Unknown health   Cancer Brother        Unknown type   Cancer Brother        Unknown type   Other Brother        Accident   Other Brother        Buyer, retail Daughter    Healthy Daughter     SOCIAL HISTORY: Social History   Tobacco Use   Smoking status: Never   Smokeless tobacco: Never  Vaping Use   Vaping  Use: Never used  Substance Use Topics   Alcohol use: No   Drug use: No   Social History   Social History Narrative   Current Social History 03/02/2020        Patient lives with spouse in a/an home / condo / townhome which is 1 story/stories. There are not steps up to the entrance the patient uses.       Patient's method of transportation is personal car.      The highest level of education was junior high / middle school.      The patient currently retired.      Identified important Relationships are God, husband, kids       Pets : none       Interests / Fun: "Walking into stores and looking around.  Riding around"       Current Stressors: "Bills, the war"       Religious / Personal Beliefs:        Other:  Right handed Caffeine 1 cup and 1 soda daily   Live in one level    Objective:  Vital Signs:  BP 137/82   Pulse 76   Ht 5\' 5"  (1.651 m)   Wt 193 lb (87.5 kg)   SpO2 97%   BMI 32.12 kg/m   General: General appearance: Awake and alert. No distress. Cooperative with exam. Skin: No rash or jaundice. HEENT: Atraumatic. Anicteric. Lungs: Non-labored breathing on room air  Heart: Regular Abdomen: Soft, non tender. Extremities: No edema. No obvious deformity.  Musculoskeletal: No obvious joint swelling.  Neurological: Mental Status: Alert. Speech fluent. No pseudobulbar affect Cranial Nerves: CNII: No RAPD. Visual fields intact. CNIII, IV, VI: PERRL. No nystagmus. EOMI. CN V: Facial sensation intact bilaterally to fine touch. CN VII: Facial muscles symmetric and strong. Left sided ptosis, improved with sustained up gaze. CN VIII: Hears finger rub well bilaterally. CN IX: No hypophonia. CN X: Palate elevates symmetrically. CN XI: Full strength shoulder shrug bilaterally. CN XII: Tongue protrusion full and midline. No atrophy or fasciculations. No significant  dysarthria Motor: Tone is normal. 5/5 in bilateral upper and lower extremities Reflexes: normal and  symmetric Sensation: Intact to light touch Gait: Normal, narrow-based gait.   Lab and Test Review: New results: TSH (09/14/21): 1.41  Previously reviewed results: Internal labs: Normal or unremarkable: CBC CMP significant for mildly elevated glucose and hypokalemia Vit D: Low at 16.1 B12 (05/28/19): 958 TSH (05/28/19): 1.370   External labs: 12/29/20: AChR abs (binding, blocking, modulating) - negative MuSK abs: negative   CT chest (10/08/18): FINDINGS: Cardiovascular: Atherosclerotic calcification of the aorta, aortic valve and coronary arteries. Pulmonic trunk and heart are enlarged. No pericardial effusion.   Mediastinum/Nodes: No pathologically enlarged mediastinal or axillary lymph nodes. Hilar regions are difficult to definitively evaluate without IV contrast but appear grossly unremarkable. Esophagus is grossly unremarkable.   Lungs/Pleura: Negative for subpleural reticulation, traction bronchiectasis/bronchiolectasis, ground-glass, architectural distortion or honeycombing. Mild scarring in the medial lower lobes. Expiratory phase imaging was not performed in true expiration, limiting the evaluation for air trapping. No pleural fluid. Airway is unremarkable.   Upper Abdomen: Visualized portion of the liver is unremarkable. Stones in the gallbladder are partially imaged. Visualized portions of the adrenal glands, kidneys, spleen, pancreas, stomach and bowel are unremarkable with exception of a small hiatal hernia. Upper abdominal lymph nodes are not enlarged by CT size criteria.   Musculoskeletal: Degenerative changes in the spine. No worrisome lytic or sclerotic lesions.   IMPRESSION: 1. No evidence of interstitial lung disease. No pulmonary parenchymal findings to explain the patient's chronic cough. 2. Cholelithiasis. 3. Aortic atherosclerosis (ICD10-170.0). Coronary artery calcification. 4. Enlarged pulmonic trunk, indicative of pulmonary  arterial hypertension.   Chest xray (08/27/21): FINDINGS: Cardiomegaly and aortic tortuosity. Low volume chest. There is no edema, consolidation, effusion, or pneumothorax.   IMPRESSION: Stable from prior.  No acute finding. Cardiomegaly.  ASSESSMENT: This is Verneda Hollopeter, a 81 y.o. female with left sided ptosis. There is no clear evidence of myasthenia gravis at this time. Her abs were negative, and she had no change in symptoms with mestinon.  Plan: -Discussed symptoms to monitor such as difficulty swallowing, breathing, and weakness  Return to clinic as needed  Total time spent reviewing records, interview, history/exam, documentation, and coordination of care on day of encounter:  20 min  Jacquelyne Balint, MD

## 2021-12-15 ENCOUNTER — Encounter: Payer: Self-pay | Admitting: Neurology

## 2021-12-15 ENCOUNTER — Ambulatory Visit: Payer: Medicare Other | Admitting: Neurology

## 2021-12-15 VITALS — BP 137/82 | HR 76 | Ht 65.0 in | Wt 193.0 lb

## 2021-12-15 DIAGNOSIS — H524 Presbyopia: Secondary | ICD-10-CM | POA: Diagnosis not present

## 2021-12-15 DIAGNOSIS — H35362 Drusen (degenerative) of macula, left eye: Secondary | ICD-10-CM | POA: Diagnosis not present

## 2021-12-15 DIAGNOSIS — H02403 Unspecified ptosis of bilateral eyelids: Secondary | ICD-10-CM | POA: Diagnosis not present

## 2021-12-15 DIAGNOSIS — H02402 Unspecified ptosis of left eyelid: Secondary | ICD-10-CM | POA: Diagnosis not present

## 2021-12-15 DIAGNOSIS — H40013 Open angle with borderline findings, low risk, bilateral: Secondary | ICD-10-CM | POA: Diagnosis not present

## 2021-12-15 DIAGNOSIS — H35033 Hypertensive retinopathy, bilateral: Secondary | ICD-10-CM | POA: Diagnosis not present

## 2021-12-15 NOTE — Patient Instructions (Signed)
There is no current evidence that you have myasthenia gravis.  Call me if you have any new or worsening problems.  The physicians and staff at [facility] are committed to providing excellent care. You may receive a survey requesting feedback about your experience at our office. We strive to receive "very good" responses to the survey questions. If you feel that your experience would prevent you from giving the office a "very good " response, please contact our office to try to remedy the situation. We may be reached at [phone]. Thank you for taking the time out of your busy day to complete the survey.  Jacquelyne Balint, MD [facility]

## 2021-12-20 ENCOUNTER — Ambulatory Visit
Admission: RE | Admit: 2021-12-20 | Discharge: 2021-12-20 | Disposition: A | Discharge status: Home / Self Care | Payer: Medicare Other | Source: Ambulatory Visit | Attending: Internal Medicine | Admitting: Internal Medicine

## 2021-12-20 DIAGNOSIS — M85851 Other specified disorders of bone density and structure, right thigh: Secondary | ICD-10-CM | POA: Diagnosis not present

## 2021-12-20 DIAGNOSIS — M810 Age-related osteoporosis without current pathological fracture: Secondary | ICD-10-CM

## 2021-12-20 DIAGNOSIS — Z780 Asymptomatic menopausal state: Secondary | ICD-10-CM | POA: Diagnosis not present

## 2022-01-16 DIAGNOSIS — H40013 Open angle with borderline findings, low risk, bilateral: Secondary | ICD-10-CM | POA: Diagnosis not present

## 2022-01-31 ENCOUNTER — Ambulatory Visit (INDEPENDENT_AMBULATORY_CARE_PROVIDER_SITE_OTHER): Payer: Medicare Other | Admitting: Student

## 2022-01-31 ENCOUNTER — Other Ambulatory Visit: Payer: Self-pay

## 2022-01-31 ENCOUNTER — Encounter: Payer: Self-pay | Admitting: Student

## 2022-01-31 VITALS — BP 139/73 | HR 71 | Temp 98.1°F | Ht 64.0 in | Wt 193.4 lb

## 2022-01-31 DIAGNOSIS — G629 Polyneuropathy, unspecified: Secondary | ICD-10-CM

## 2022-01-31 DIAGNOSIS — M810 Age-related osteoporosis without current pathological fracture: Secondary | ICD-10-CM

## 2022-01-31 DIAGNOSIS — E559 Vitamin D deficiency, unspecified: Secondary | ICD-10-CM | POA: Diagnosis not present

## 2022-01-31 DIAGNOSIS — B370 Candidal stomatitis: Secondary | ICD-10-CM

## 2022-01-31 DIAGNOSIS — Z23 Encounter for immunization: Secondary | ICD-10-CM | POA: Diagnosis not present

## 2022-01-31 DIAGNOSIS — I10 Essential (primary) hypertension: Secondary | ICD-10-CM | POA: Diagnosis not present

## 2022-01-31 MED ORDER — GABAPENTIN 100 MG PO CAPS: 100.0000 mg | ORAL_CAPSULE | Freq: Every day | ORAL | 1 refills | Status: AC

## 2022-01-31 NOTE — Assessment & Plan Note (Signed)
Obtained flu shot today.

## 2022-01-31 NOTE — Assessment & Plan Note (Addendum)
Level 16 in 06/2021. She reports taking Vitamin D3 at home but unclear dosage and not fully adherent with it. Will repeat Vitamin D level today and have advised her to continue with supplementation.  Plan: -f/u Vit D3 level -Vit D3 supplementation  ADDENDUM: Vit D3 level of 28. Advised to discontinue calcium + D3 supplementation given high normal calcium levels. Advised her to instead take Vitamin D3 alone at 2000IU for supplementation. Repeat Vitamin D3 level in 3-6 months.

## 2022-01-31 NOTE — Progress Notes (Signed)
CC: lower leg aches  HPI:  [name] is a 82 y.o. female with history listed below presenting to the [facility] for lower leg aches. Please see individualized problem based charting for full HPI.  Past Medical History:  Diagnosis Date   Aortic stenosis    mild to moderate AS with mean AVG 67mmHg by echo 04/2020   Atrial flutter ([place]) 05/07/2014   With controlled ventricular rate on no AV nodal agents.   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   Rigid esophagoscopy, dilation, and Botox injection into cricopharyngeus muscle recommended    Essential hypertension    takes Amlodipine daily   GERD (gastroesophageal reflux disease)    takes Pantoprazole daily   History of blood transfusion    no abnormal reaction noted   History of migraine    last one a week   History of shingles    Hypertensive retinopathy of both eyes 03/07/2016   Moderate   Mitral valve regurgitation, rheumatic 05/05/2014   Moderate to severe with peak PA pressure of 56 mmHg on Echo 04/2014    Muscle spasm of both lower legs    takes Zanaflex daily as needed   Osteoarthritis    knees and hands   Peripheral edema    takes Lasix daily as needed    PONV (postoperative nausea and vomiting)    with recent dilatation of throat, had N/V   Systolic murmur    per patient report she has had rheumatic fever in childhood   Thoracic aortic atherosclerosis ([place]) 10/08/2019   Radiographic on CT chest 2020    Review of Systems:  Negative aside from that listed in individualized problem based charting.  Physical Exam:  Vitals:   01/31/22 1528  BP: 139/73  Pulse: 71  Temp: 98.1 F (36.7 C)  TempSrc: Oral  SpO2: 97%  Weight: 193 lb 6.4 oz (87.7 kg)  Height: 5\' 4"  (1.626 m)   Physical Exam Constitutional:      Appearance: Normal appearance. She is obese. She is not ill-appearing.  HENT:     Mouth/Throat:     Mouth:  Mucous membranes are moist.     Pharynx: Oropharynx is clear.  Eyes:     Extraocular Movements: Extraocular movements intact.     Conjunctiva/sclera: Conjunctivae normal.     Pupils: Pupils are equal, round, and reactive to light.  Cardiovascular:     Rate and Rhythm: Normal rate and regular rhythm.  Pulmonary:     Effort: Pulmonary effort is normal. No respiratory distress.     Breath sounds: Normal breath sounds.  Abdominal:     General: Bowel sounds are normal. There is no distension.     Palpations: Abdomen is soft.     Tenderness: There is no abdominal tenderness.  Musculoskeletal:        General: Normal range of motion.     Comments: Trace pitting edema in bilateral LE.  Skin:    General: Skin is warm and dry.  Neurological:     Mental Status: She is alert and oriented to person, place, and time. Mental status is at baseline.     Sensory: No sensory deficit.     Motor: No weakness.     Gait: Gait normal.  Psychiatric:        Mood and Affect: Mood normal.        Behavior: Behavior normal.  Assessment & Plan:   See Encounters Tab for problem based charting.  Patient discussed with Dr.  Jimmye Norman

## 2022-01-31 NOTE — Patient Instructions (Addendum)
[name],  It was a pleasure seeing you in the clinic today.   We are checking your vitamin D level and vitamin B12 level. Please keep taking your vitamin D supplements daily. I have ordered a medicine called gabapentin to help with your nerve pain. Please take 1 tablet at bedtime. You got your flu shot today. Please schedule an appointment at any local pharmacy to get your Shingles vaccine. Please come back for your next appointment in 6 weeks.  Please call our clinic at [phone] if you have any questions or concerns. The best time to call is Monday-Friday from 9am-4pm, but there is someone available 24/7 at the same number. If you need medication refills, please notify your pharmacy one week in advance and they will send [place] a request.   Thank you for letting us take part in your care. We look forward to seeing you next time!

## 2022-01-31 NOTE — Assessment & Plan Note (Addendum)
Patient complaining of lower leg aching that has persisted for the past year. She notes burning/aching primarily at nighttime when she is laying in bed, stating that she often gets awakened at night due to leg pain. Her burning pain begins on the posterior aspect of her lower leg (mid-shin) and travels distally to the plantar surface of her feet/toes. She does not experience pain with ambulation or during the day time when sitting on a couch.   On exam, her lower extremities have trace pitting edema but otherwise normal exam. Her sensation and strength are intact bilaterally. Vibration and proprioception testing unremarkable. She has a normal gait on limited assessment of her walking in the room.   Her history sounds like neuropathy to me although she does not have any obvious risk factors (such as diabetes). TSH was checked 4 months ago (during duration of this pain) and was normal. She does have vitamin D deficiency which may be playing a role. Will check Vitamin D level today along with B12 to assess for deficiencies. Will prescribe low dose gabapentin qhs given sedative effects and pain being predominately at nighttime.   Plan: -f/u Vit D3 and B12 -gabapentin 100mg  qhs -f/u in 6 weeks with PCP  ADDENDUM: Vitamin B12 level normal. Vitamin D3 mildly low, advised supplementation (see Vitamin D deficiency).

## 2022-01-31 NOTE — Assessment & Plan Note (Signed)
Patient living with HTN, on norvasc 10mg  daily and HCTZ 25mg  daily. BP 139/73 today. Given advanced age, no need for strict BP control.  -continue current regimen

## 2022-01-31 NOTE — Assessment & Plan Note (Signed)
On bisphosphonate therapy. Last holiday was in 2017-2018. She is again due for a drug holiday so will stop through 2024. Restart alendronate 70mg  weekly dosing in 1 year.

## 2022-01-31 NOTE — Assessment & Plan Note (Signed)
Oral mucosa clear on exam today. This appears to have resolved.

## 2022-02-01 ENCOUNTER — Ambulatory Visit (INDEPENDENT_AMBULATORY_CARE_PROVIDER_SITE_OTHER): Payer: Medicare Other | Admitting: *Deleted

## 2022-02-01 DIAGNOSIS — Z0000 Encounter for general adult medical examination without abnormal findings: Secondary | ICD-10-CM | POA: Diagnosis not present

## 2022-02-01 NOTE — Patient Instructions (Signed)

## 2022-02-01 NOTE — Progress Notes (Signed)
Subjective:   [name] is a 82 y.o. female who presents for Medicare Annual (Subsequent) preventive examination. I connected with  Charleigh Correnti on 02/01/22 by a audio enabled telemedicine application and verified that I am speaking with the correct person using two identifiers.  Patient Location: Home  Provider Location: Office/Clinic  I discussed the limitations of evaluation and management by telemedicine. The patient expressed understanding and agreed to proceed.   Review of Systems    Defer to pcp       Objective:    There were no vitals filed for this visit. There is no height or weight on file to calculate BMI.     02/01/2022    4:21 PM 01/31/2022    3:19 PM 12/15/2021    1:45 PM 12/01/2021    2:59 PM 09/14/2021    9:20 AM 09/01/2021    8:42 AM 08/27/2021    9:05 AM  Advanced Directives  Does Patient Have a Medical Advance Directive? No No No No No No No  Would patient like information on creating a medical advance directive? No - Patient declined Yes (MAU/Ambulatory/Procedural Areas - Information given)  No - Patient declined  No - Patient declined No - Patient declined    Current Medications (verified) Outpatient Encounter Medications as of 02/01/2022  Medication Sig   albuterol (VENTOLIN HFA) 108 (90 Base) MCG/ACT inhaler Inhale 1-2 puffs into the lungs 3 (three) times daily as needed for wheezing.   amLODipine (NORVASC) 10 MG tablet Take 1 tablet (10 mg total) by mouth daily.   apixaban (ELIQUIS) 5 MG TABS tablet Take 1 tablet (5 mg total) by mouth 2 (two) times daily.   augmented betamethasone dipropionate (DIPROLENE-AF) 0.05 % ointment Apply topically daily.   fluticasone (FLONASE) 50 MCG/ACT nasal spray SHAKE LIQUID AND USE 1 SPRAY IN EACH NOSTRIL DAILY   gabapentin (NEURONTIN) 100 MG capsule Take 1 capsule (100 mg total) by mouth at bedtime.   hydrochlorothiazide (HYDRODIURIL) 25 MG tablet Take 1 tablet (25 mg total) by mouth daily.    potassium chloride SA (KLOR-CON M) 20 MEQ tablet Take 1 tablet (20 mEq total) by mouth daily.   No facility-administered encounter medications on file as of 02/01/2022.    Allergies (verified) Ace inhibitors   History: Past Medical History:  Diagnosis Date   Aortic stenosis    mild to moderate AS with mean AVG by echo 04/2020   Atrial flutter (HCC) 05/07/2014   With controlled ventricular rate on no AV nodal agents.   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   Rigid esophagoscopy, dilation, and Botox injection into cricopharyngeus muscle recommended    Essential hypertension    takes Amlodipine daily   GERD (gastroesophageal reflux disease)    takes Pantoprazole daily   History of blood transfusion    no abnormal reaction noted   History of migraine    last one a week   History of shingles    Hypertensive retinopathy of both eyes 03/07/2016   Moderate   Mitral valve regurgitation, rheumatic 05/05/2014   Moderate to severe with peak PA pressure of 56 mmHg on Echo 04/2014    Muscle spasm of both lower legs    takes Zanaflex daily as needed   Osteoarthritis    knees and hands   Peripheral edema    takes Lasix daily as needed    PONV (postoperative nausea and  vomiting)    with recent dilatation of throat, had N/V   Systolic murmur    per patient report she has had rheumatic fever in childhood   Thoracic aortic atherosclerosis ([place]) 10/08/2019   Radiographic on CT chest 2020   Past Surgical History:  Procedure Laterality Date   ABDOMINAL HYSTERECTOMY     Fibroids   COLONOSCOPY W/ BIOPSIES AND POLYPECTOMY     CYST EXCISION Right    on hand   ESOPHAGOSCOPY WITH DILITATION N/A 04/14/2014   Procedure: RIGID ESOPHAGOSCOPY WITH DILITATION AND BOTOX  INJECTION;  Surgeon: Jerrell Belfast, MD;  Location: [place];  Service: ENT;  Laterality: N/A;   MULTIPLE TOOTH EXTRACTIONS     TOTAL KNEE  ARTHROPLASTY Right 04/06/2015   TOTAL KNEE ARTHROPLASTY Right 04/06/2015   Procedure: RIGHT TOTAL KNEE ARTHROPLASTY;  Surgeon: Melrose Nakayama, MD;  Location: [place];  Service: Orthopedics;  Laterality: Right;   TOTAL KNEE ARTHROPLASTY Left 09/14/2015   Procedure: TOTAL KNEE ARTHROPLASTY LEFT;  Surgeon: Melrose Nakayama, MD;  Location: [place];  Service: Orthopedics;  Laterality: Left;   Family History  Problem Relation Age of Onset   Cancer Mother        Unknown type   Stroke Father    Unexplained death Sister    Heart attack Sister    Other Brother        Unknown health   Healthy Daughter    Healthy Son    Asthma Sister    Other Sister        Unknown health   Other Brother        Unknown health   Other Brother        Unknown health   Cancer Brother        Unknown type   Cancer Brother        Unknown type   Other Brother        Accident   Other Brother        Investment banker, corporate accident   Healthy Daughter    Healthy Daughter    Social History   Socioeconomic History   Marital status: Married    Spouse name: Not on file   Number of children: Not on file   Years of education: Not on file   Highest education level: Not on file  Occupational History   Not on file  Tobacco Use   Smoking status: Never   Smokeless tobacco: Never  Vaping Use   Vaping Use: Never used  Substance and Sexual Activity   Alcohol use: No   Drug use: No   Sexual activity: Not Currently  Other Topics Concern   Not on file  Social History Narrative   Current Social History 03/02/2020        Patient lives with spouse in a/an home / condo / townhome which is 1 story/stories. There are not steps up to the entrance the patient uses.       Patient's method of transportation is personal car.      The highest level of education was junior high / middle school.      The patient currently retired.      Identified important Relationships are God, husband, kids       Pets : none       Interests / Fun:  "Walking into stores and looking around.  Riding around"       Current Stressors: "Bills, the war"       Religious / Personal Beliefs:  Other:  Right handed Caffeine 1 cup and 1 soda daily   Live in one level   Social Determinants of Health   Financial Resource Strain: High Risk (10/17/2017)   Overall Financial Resource Strain (CARDIA)    Difficulty of Paying Living Expenses: Hard  Food Insecurity: No Food Insecurity (02/01/2022)   Hunger Vital Sign    Worried About Running Out of Food in the Last Year: Never true    Ran Out of Food in the Last Year: Never true  Transportation Needs: No Transportation Needs (12/01/2021)   PRAPARE - Administrator, Civil Service (Medical): No    Lack of Transportation (Non-Medical): No  Physical Activity: Not on file  Stress: Not on file  Social Connections: Moderately Isolated (01/31/2022)   Social Connection and Isolation Panel [NHANES]    Frequency of Communication with Friends and Family: More than three times a week    Frequency of Social Gatherings with Friends and Family: More than three times a week    Attends Religious Services: More than 4 times per year    Active Member of [employer] or Organizations: No    Attends Banker Meetings: Never    Marital Status: Widowed    Tobacco Counseling Counseling given: Not Answered   Clinical Intake:  Pre-visit preparation completed: Yes  Pain : No/denies pain     BMI - recorded: 33.2 Nutritional Status: BMI > 30  Obese Nutritional Risks: None Diabetes: No  How often do you need to have someone help you when you read instructions, pamphlets, or other written materials from your doctor or pharmacy?: 1 - Never What is the last grade level you completed in school?: GED  Diabetic?no   Interpreter Needed?: No  Information entered by :: [email or web address]   Activities of Daily Living    02/01/2022   12:11 PM 01/31/2022    3:20 PM  In your present state of  health, do you have any difficulty performing the following activities:  Hearing? 0 0  Vision? 0 0  Difficulty concentrating or making decisions? 0 0  Walking or climbing stairs? 1 0  Dressing or bathing? 0 0  Doing errands, shopping? 1 0    Patient Care Team: Miguel Aschoff, MD as PCP - General (Internal Medicine) Meriam Sprague, MD as PCP - Cardiology (Cardiology) Rosalio Macadamia, NP (Inactive) as Nurse Practitioner (Nurse Practitioner) Mateo Flow, MD as Consulting Physician (Ophthalmology) Marcene Corning, MD as Consulting Physician (Orthopedic Surgery) Antony Madura, MD as Consulting Physician (Neurology)  Indicate any recent Medical Services you may have received from other than [facility] providers in the past year (date may be approximate).     Assessment:   This is a routine wellness examination for [facility].  Hearing/Vision screen No results found.  Dietary issues and exercise activities discussed:     Goals Addressed   None   Depression Screen    02/01/2022    4:21 PM 01/31/2022    3:20 PM 12/01/2021    3:00 PM 06/23/2021    8:52 AM 02/10/2021   10:53 AM 11/18/2020   10:46 AM 08/19/2020   10:56 AM  PHQ 2/9 Scores  PHQ - 2 Score 0 0 0 0 0 4 0  PHQ- 9 Score 1 0 1  0 13     Fall Risk    02/01/2022   12:11 PM 01/31/2022    3:19 PM 12/15/2021    1:45 PM 12/01/2021    3:00 PM 09/14/2021  9:20 AM  [place] in the past year? 0 0 0 0 0  Number falls in past yr: 0  0  0  Injury with Fall? 0  0  0  Risk for fall due to : History of fall(s) No Fall Risks  No Fall Risks   Follow up Falls evaluation completed  Falls evaluation completed Falls evaluation completed     [place]:  Any stairs in or around the home? No  If so, are there any without handrails? No  Home free of loose throw rugs in walkways, pet beds, electrical cords, etc? No  Adequate lighting in your home to reduce risk of falls? Yes    ASSISTIVE DEVICES UTILIZED TO PREVENT FALLS:  Life alert? No  Use of a cane, walker or w/c? No  Grab bars in the bathroom? Yes  Shower chair or bench in shower? Yes  Elevated toilet seat or a handicapped toilet? No   TIMED UP AND GO:  Was the test performed? No .  Length of time to ambulate 10 feet: 0 sec.     Cognitive Function:        02/01/2022    4:17 PM  6CIT Screen  What Year? 4 points  What month? 0 points  What time? 0 points  Count back from 20 0 points  Months in reverse 4 points  Repeat phrase 0 points  Total Score 8 points    Immunizations Immunization History  Administered Date(s) Administered   Fluad Quad(high Dose 65+) 11/18/2020, 01/31/2022   Influenza Split 10/21/2010   Influenza,inj,Quad PF,6+ Mos 10/08/2014, 11/02/2015, 10/06/2016, 10/26/2017, 09/19/2018, 10/09/2019   PFIZER(Purple Top)SARS-COV-2 Vaccination 03/13/2019, 04/03/2019   PPD Test 04/08/2015   Pneumococcal Conjugate-13 05/22/2014   Pneumococcal Polysaccharide-23 03/11/2012   Tdap 03/11/2012    TDAP status: Up to date  Flu Vaccine status: Up to date  Pneumococcal vaccine status: Up to date  Covid-19 vaccine status: Completed vaccines  Qualifies for Shingles Vaccine? Yes   Zostavax completed  unknown    Shingrix Completed?: No.    Education has been provided regarding the importance of this vaccine. Patient has been advised to call insurance company to determine out of pocket expense if they have not yet received this vaccine. Advised may also receive vaccine at local pharmacy or Health Dept. Verbalized acceptance and understanding.  Screening Tests Health Maintenance  Topic Date Due   Zoster Vaccines- Shingrix (1 of 2) Never done   COVID-19 Vaccine (3 - Pfizer risk series) 05/01/2019   DTaP/Tdap/Td (2 - Td or Tdap) 03/12/2022   MAMMOGRAM  04/19/2022   Medicare Annual Wellness (AWV)  02/02/2023   Pneumonia Vaccine 58+ Years old  Completed   INFLUENZA VACCINE  Completed    DEXA SCAN  Completed   HPV VACCINES  Aged Out    Health Maintenance  Health Maintenance Due  Topic Date Due   Zoster Vaccines- Shingrix (1 of 2) Never done   COVID-19 Vaccine (3 - Pfizer risk series) 05/01/2019    Colorectal cancer screening: No longer required.   Mammogram status: Completed 04/18/21. Repeat every year or at discretion of pcp     Lung Cancer Screening: (Low Dose CT Chest recommended if Age 40-80 years, 30 pack-year currently smoking OR have quit w/in 15years.) does not qualify.   Lung Cancer Screening Referral: n/a   Additional Screening:  Hepatitis C Screening: does not qualify; Completed unknown   Vision Screening: Recommended annual ophthalmology  exams for early detection of glaucoma and other disorders of the eye. Is the patient up to date with their annual eye exam?  Yes  Who is the provider or what is the name of the office in which the patient attends annual eye exams? [facility] If pt is not established with a provider, would they like to be referred to a provider to establish care? No .   Dental Screening: Recommended annual dental exams for proper oral hygiene  Community Resource Referral / Chronic Care Management: CRR required this visit?  No   CCM required this visit?  No      Plan:     I have personally reviewed and noted the following in the patient's chart:   Medical and social history Use of alcohol, tobacco or illicit drugs  Current medications and supplements including opioid prescriptions. Patient is not currently taking opioid prescriptions. Functional ability and status Nutritional status Physical activity Advanced directives List of other physicians Hospitalizations, surgeries, and ER visits in previous 12 months Vitals Screenings to include cognitive, depression, and falls Referrals and appointments  In addition, I have reviewed and discussed with patient certain preventive protocols, quality metrics, and best  practice recommendations. A written personalized care plan for preventive services as well as general preventive health recommendations were provided to patient.     Nicoletta Dress, [place]   02/01/2022   Nurse Notes:   Ms. Wisser , Thank you for taking time to come for your Medicare Wellness Visit. I appreciate your ongoing commitment to your health goals. Please review the following plan we discussed and let me know if I can assist you in the future.   These are the goals we discussed:  Goals       Blood Pressure < 150/90      Find part-time job (pt-stated)      Increase physical activity      Begin seated and standing exercises with exercise band to increase strength and balance.       Ride stationary bike. Start with once daily and work up to twice daily.       Weight < 190 lb (86.183 kg)        This is a list of the screening recommended for you and due dates:  Health Maintenance  Topic Date Due   Zoster (Shingles) Vaccine (1 of 2) Never done   COVID-19 Vaccine (3 - Pfizer risk series) 05/01/2019   DTaP/Tdap/Td vaccine (2 - Td or Tdap) 03/12/2022   Mammogram  04/19/2022   Medicare Annual Wellness Visit  02/02/2023   Pneumonia Vaccine  Completed   Flu Shot  Completed   DEXA scan (bone density measurement)  Completed   HPV Vaccine  Aged Out

## 2022-02-03 LAB — VITAMIN B12: Vitamin B-12: 678 pg/mL (ref 232–1245)

## 2022-02-03 LAB — VITAMIN D 25 HYDROXY (VIT D DEFICIENCY, FRACTURES): Vit D, 25-Hydroxy: 28.7 ng/mL — ABNORMAL LOW (ref 30.0–100.0)

## 2022-02-03 NOTE — Progress Notes (Signed)
Internal Medicine Clinic Attending  Case discussed with Dr. Jinwala  At the time of the visit.  We reviewed the resident's history and exam and pertinent patient test results.  I agree with the assessment, diagnosis, and plan of care documented in the resident's note.  

## 2022-02-14 DIAGNOSIS — I509 Heart failure, unspecified: Secondary | ICD-10-CM | POA: Diagnosis not present

## 2022-02-14 DIAGNOSIS — Z0000 Encounter for general adult medical examination without abnormal findings: Secondary | ICD-10-CM | POA: Diagnosis not present

## 2022-02-14 DIAGNOSIS — I4891 Unspecified atrial fibrillation: Secondary | ICD-10-CM | POA: Diagnosis not present

## 2022-02-14 DIAGNOSIS — D6869 Other thrombophilia: Secondary | ICD-10-CM | POA: Diagnosis not present

## 2022-03-01 DIAGNOSIS — Z23 Encounter for immunization: Secondary | ICD-10-CM | POA: Diagnosis not present

## 2022-03-01 DIAGNOSIS — Z1159 Encounter for screening for other viral diseases: Secondary | ICD-10-CM | POA: Diagnosis not present

## 2022-03-01 DIAGNOSIS — I10 Essential (primary) hypertension: Secondary | ICD-10-CM | POA: Diagnosis not present

## 2022-03-01 DIAGNOSIS — I4892 Unspecified atrial flutter: Secondary | ICD-10-CM | POA: Diagnosis not present

## 2022-03-01 DIAGNOSIS — I509 Heart failure, unspecified: Secondary | ICD-10-CM | POA: Diagnosis not present

## 2022-03-01 DIAGNOSIS — I700 Atherosclerosis of aorta: Secondary | ICD-10-CM | POA: Diagnosis not present

## 2022-03-01 DIAGNOSIS — Z79899 Other long term (current) drug therapy: Secondary | ICD-10-CM | POA: Diagnosis not present

## 2022-03-01 DIAGNOSIS — M810 Age-related osteoporosis without current pathological fracture: Secondary | ICD-10-CM | POA: Diagnosis not present

## 2022-03-01 DIAGNOSIS — G629 Polyneuropathy, unspecified: Secondary | ICD-10-CM | POA: Diagnosis not present

## 2022-03-01 DIAGNOSIS — I4891 Unspecified atrial fibrillation: Secondary | ICD-10-CM | POA: Diagnosis not present

## 2022-03-16 ENCOUNTER — Ambulatory Visit (HOSPITAL_COMMUNITY): Payer: Medicare Other | Attending: Cardiology

## 2022-03-16 DIAGNOSIS — I071 Rheumatic tricuspid insufficiency: Secondary | ICD-10-CM

## 2022-03-16 DIAGNOSIS — I77819 Aortic ectasia, unspecified site: Secondary | ICD-10-CM | POA: Diagnosis not present

## 2022-03-16 DIAGNOSIS — I350 Nonrheumatic aortic (valve) stenosis: Secondary | ICD-10-CM

## 2022-03-16 DIAGNOSIS — I051 Rheumatic mitral insufficiency: Secondary | ICD-10-CM

## 2022-03-16 LAB — ECHOCARDIOGRAM COMPLETE
AR max vel: 1.2 cm2
AV Area VTI: 1.34 cm2
AV Area mean vel: 1.25 cm2
AV Mean grad: 17 mmHg
AV Peak grad: 32.4 mmHg
Ao pk vel: 2.85 m/s
Area-P 1/2: 1.57 cm2
MV M vel: 5.9 m/s
MV Peak grad: 139 mmHg
MV VTI: 2.2 cm2
P 1/2 time: 1079 msec
S' Lateral: 3 cm

## 2022-03-20 ENCOUNTER — Other Ambulatory Visit: Payer: Self-pay | Admitting: Internal Medicine

## 2022-03-20 ENCOUNTER — Telehealth: Payer: Self-pay

## 2022-03-20 DIAGNOSIS — I051 Rheumatic mitral insufficiency: Secondary | ICD-10-CM

## 2022-03-20 DIAGNOSIS — I350 Nonrheumatic aortic (valve) stenosis: Secondary | ICD-10-CM

## 2022-03-20 DIAGNOSIS — I77819 Aortic ectasia, unspecified site: Secondary | ICD-10-CM

## 2022-03-20 DIAGNOSIS — I071 Rheumatic tricuspid insufficiency: Secondary | ICD-10-CM

## 2022-03-20 DIAGNOSIS — Z1231 Encounter for screening mammogram for malignant neoplasm of breast: Secondary | ICD-10-CM

## 2022-03-20 NOTE — Telephone Encounter (Signed)
Pt advised her Echo results and verbalized understanding. I advised her that she is due for follow up and I can make an appt for her but she says she was not at home and needs to call back to make the appt when she is in front of her calendar. I will forward to the [facility] for further scheduling follow up.

## 2022-03-20 NOTE — Telephone Encounter (Signed)
-----   Message from Freada Bergeron, MD sent at 03/17/2022  9:07 PM EDT ----- Her echo shows normal pumping function. There Is mild to moderate narrowing of her aortic valve and mild to moderate leakiness of her mitral valve. Her aorta measures 14mm which is very mildly dilated. We will continue to monitor with yearly echoes going forward.

## 2022-03-20 NOTE — Addendum Note (Signed)
Addended by: Stephani Police on: 03/20/2022 01:38 PM   Modules accepted: Orders

## 2022-03-22 ENCOUNTER — Telehealth: Payer: Self-pay

## 2022-03-22 NOTE — Telephone Encounter (Signed)
Faxed notes to [facility]

## 2022-03-22 NOTE — Telephone Encounter (Signed)
-----   Message from Shellia Carwin, MD sent at 03/21/2022  7:43 AM EDT ----- Joseph Art,  Can you forward my last clinic note to [facility]? I was sent their most recent note where patient told them I wanted her to go to [facility] to strengthen her eye. That was not me. She is confused. I want to make sure they know what I actually told her.  Thank you,  Kai Levins, MD  ----- Message ----- From: [facility] Sent: 03/21/2022   7:14 AM EDT To: Shellia Carwin, MD

## 2022-05-03 ENCOUNTER — Ambulatory Visit
Admission: RE | Admit: 2022-05-03 | Discharge: 2022-05-03 | Disposition: A | Discharge status: Home / Self Care | Payer: Medicare Other | Source: Ambulatory Visit | Attending: Internal Medicine | Admitting: Internal Medicine

## 2022-05-03 DIAGNOSIS — Z1231 Encounter for screening mammogram for malignant neoplasm of breast: Secondary | ICD-10-CM

## 2022-06-08 NOTE — Progress Notes (Signed)
I reviewed the AWV findings with the provider who conducted the visit. I was present in the office suite and immediately available to provide assistance and direction throughout the time the service was provided.   Merrilyn Puma, MD [facility], PGY-3 06/08/2022, 2:27 PM

## 2022-06-08 NOTE — Addendum Note (Signed)
Addended by: Merrilyn Puma on: 06/08/2022 02:28 PM   Modules accepted: Level of Service

## 2022-12-04 IMAGING — MG MM DIGITAL SCREENING BILAT W/ TOMO AND CAD
8 series · 8 of 24 positions shown · non-contrast
Comparison: Previous exam(s).

CLINICAL DATA: Screening.

EXAM:
DIGITAL SCREENING BILATERAL MAMMOGRAM WITH TOMOSYNTHESIS AND CAD
TECHNIQUE: Bilateral screening digital craniocaudal and mediolateral oblique
mammograms were obtained. Bilateral screening digital breast
tomosynthesis was performed. The images were evaluated with
computer-aided detection.

[L CC synth-2D]
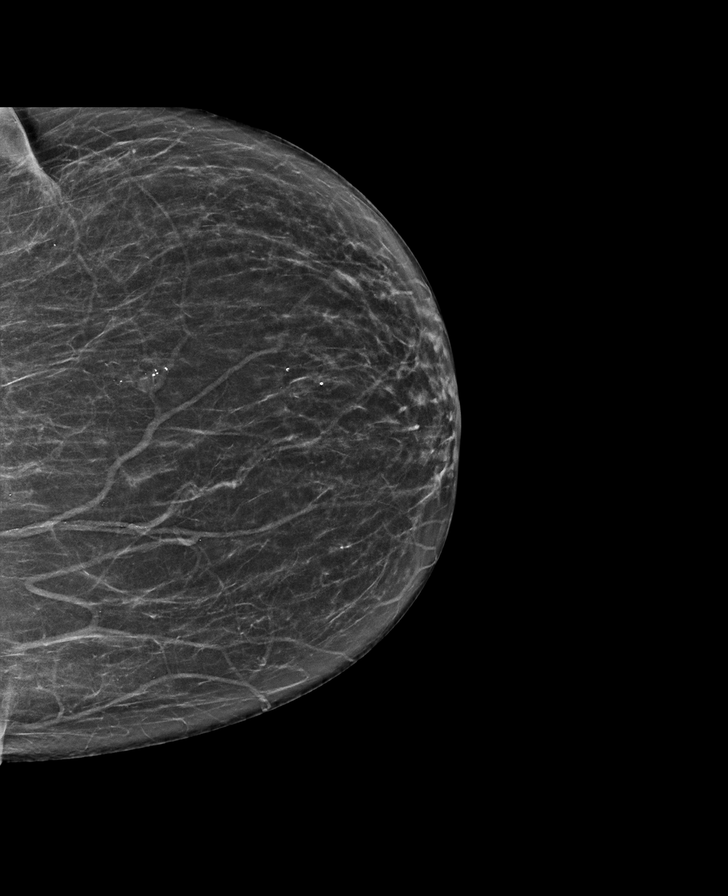

[R CC synth-2D]
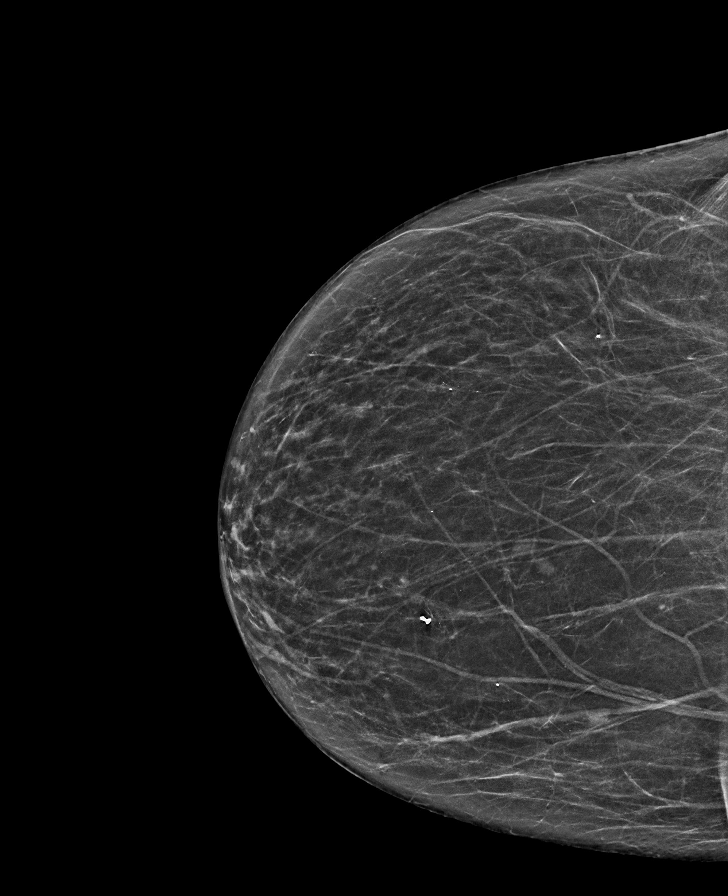

[R MLO synth-2D]
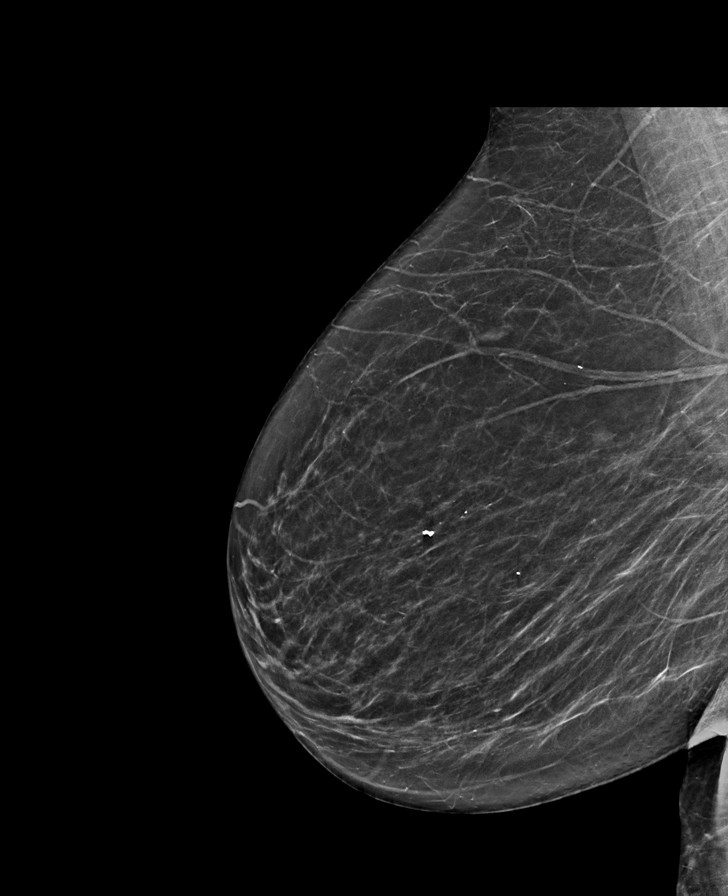

[L MLO synth-2D]
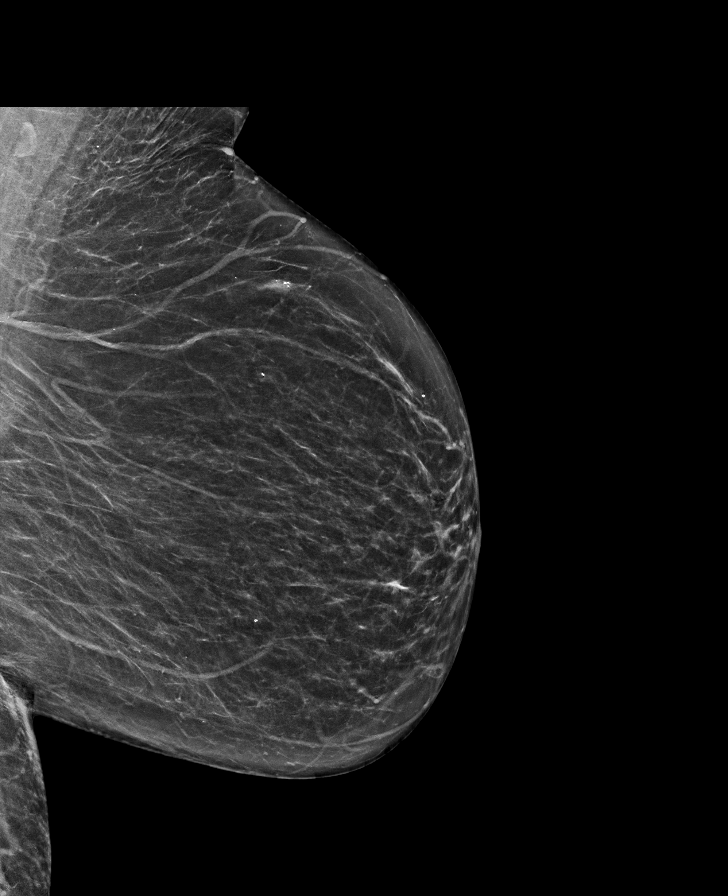

[R MLO tomo · tomo slice 33/64.0]
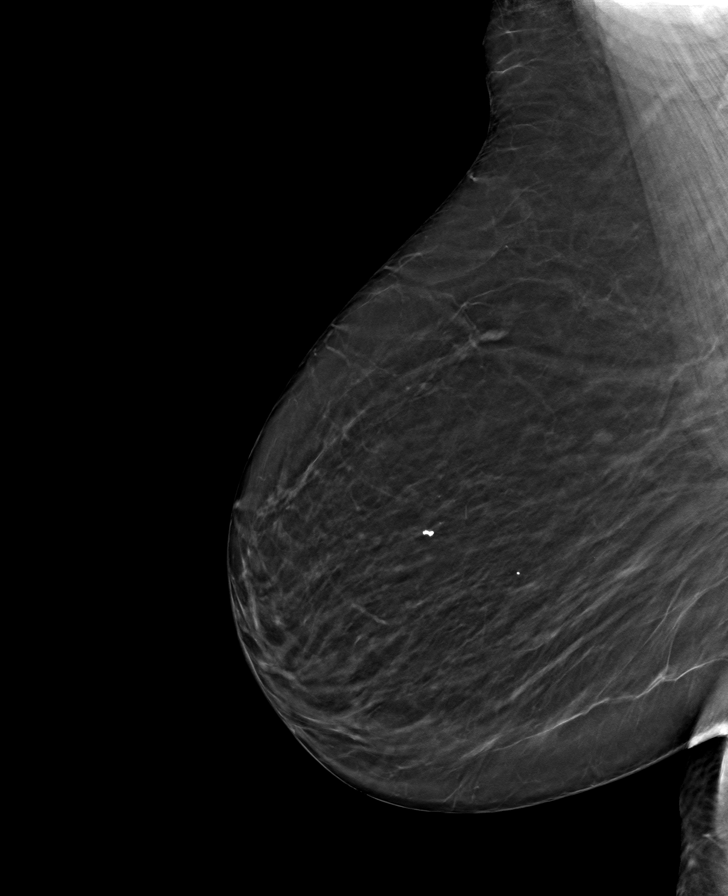

[L MLO tomo · tomo slice 33/64.0]
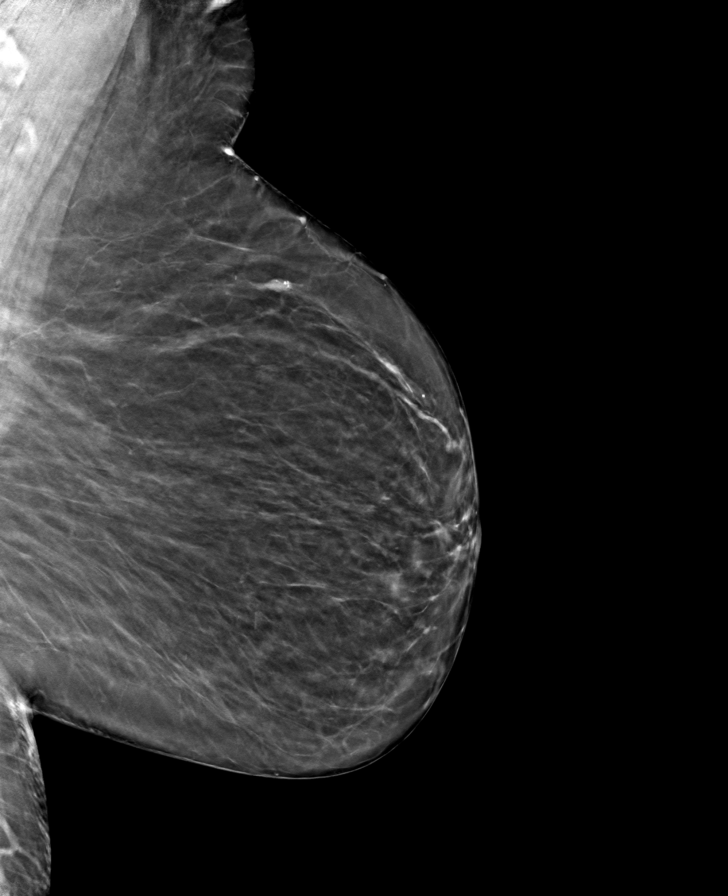

[R CC tomo · tomo slice 29/58.0]
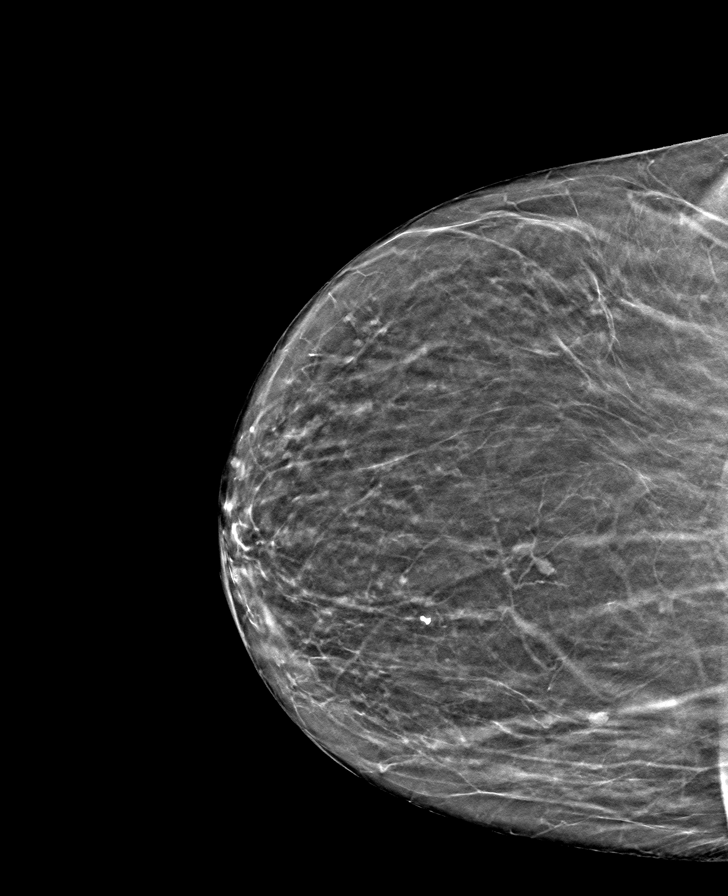

[L CC tomo · tomo slice 31/61.0]
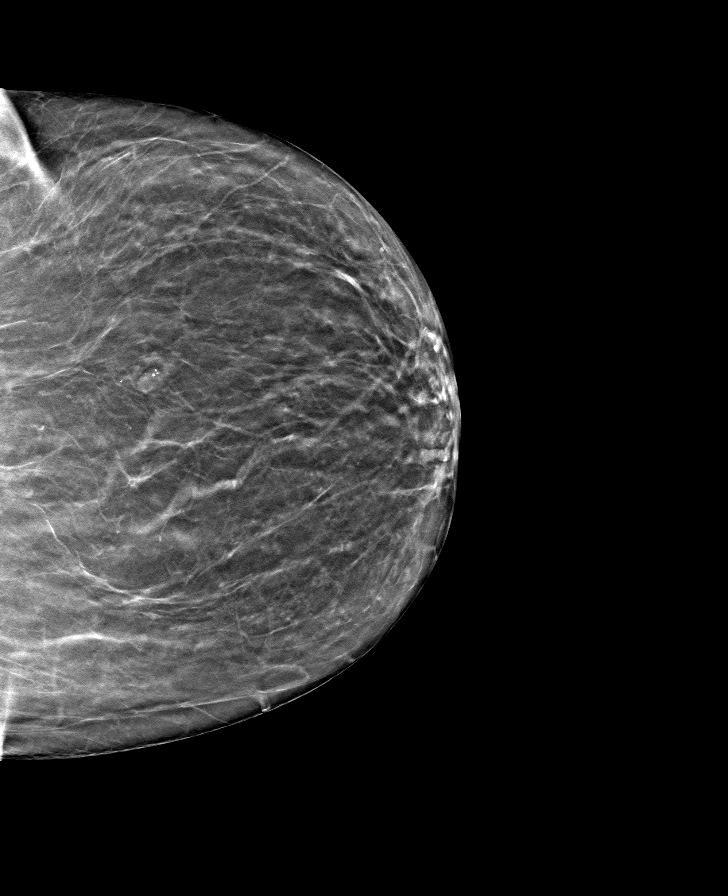

[8 of 24 positions shown; findings below may reference images not displayed]

ACR Breast Density Category b: There are scattered areas of
fibroglandular density.
FINDINGS: There are no findings suspicious for malignancy.
IMPRESSION: No mammographic evidence of malignancy. A result letter of this
screening mammogram will be mailed directly to the patient.

RECOMMENDATION:
Screening mammogram in one year. (Code:51-O-LD2)

BI-RADS CATEGORY  1: Negative.

## 2023-03-15 ENCOUNTER — Ambulatory Visit (HOSPITAL_COMMUNITY): Payer: 59 | Attending: Internal Medicine

## 2023-03-15 DIAGNOSIS — I77819 Aortic ectasia, unspecified site: Secondary | ICD-10-CM | POA: Insufficient documentation

## 2023-03-15 DIAGNOSIS — I051 Rheumatic mitral insufficiency: Secondary | ICD-10-CM | POA: Diagnosis present

## 2023-03-15 DIAGNOSIS — I071 Rheumatic tricuspid insufficiency: Secondary | ICD-10-CM | POA: Diagnosis not present

## 2023-03-15 DIAGNOSIS — I350 Nonrheumatic aortic (valve) stenosis: Secondary | ICD-10-CM | POA: Insufficient documentation

## 2023-03-15 LAB — ECHOCARDIOGRAM COMPLETE
AR max vel: 1.22 cm2
AV Area VTI: 1.31 cm2
AV Area mean vel: 1.15 cm2
AV Mean grad: 14.4 mmHg
AV Peak grad: 24.9 mmHg
Ao pk vel: 2.5 m/s
Area-P 1/2: 1.91 cm2
MV M vel: 6.06 m/s
MV Peak grad: 146.9 mmHg
MV VTI: 1.82 cm2
P 1/2 time: 394 ms
S' Lateral: 2.7 cm

## 2023-03-19 ENCOUNTER — Encounter: Payer: Self-pay | Admitting: *Deleted

## 2023-03-21 ENCOUNTER — Telehealth: Payer: Self-pay

## 2023-03-21 NOTE — Telephone Encounter (Signed)
 Called and spoke with patient to let her know that I placed a letter in the mail for her today with recent results and requesting her to call the office to schedule an appointment with Dr Antoine Poche as Dr Shari Prows is no longer with this practice. She states she would call.

## 2023-03-21 NOTE — Telephone Encounter (Signed)
-----   Message from Nurse Nehemiah Settle S sent at 03/15/2023  2:53 PM EDT -----  ----- Message ----- From: Interface, [employer] Sent: 03/15/2023  12:27 PM EDT To: [address] Triage

## 2023-06-06 ENCOUNTER — Other Ambulatory Visit: Payer: Self-pay | Admitting: Family Medicine

## 2023-06-06 DIAGNOSIS — Z1231 Encounter for screening mammogram for malignant neoplasm of breast: Secondary | ICD-10-CM

## 2023-06-29 ENCOUNTER — Ambulatory Visit
Admission: RE | Admit: 2023-06-29 | Discharge: 2023-06-29 | Disposition: A | Discharge status: Home / Self Care | Source: Ambulatory Visit | Attending: Family Medicine

## 2023-06-29 DIAGNOSIS — Z1231 Encounter for screening mammogram for malignant neoplasm of breast: Secondary | ICD-10-CM

## 2023-07-19 NOTE — Progress Notes (Unsigned)
 Cardiology Office Note:   Date:  07/20/2023  ID:  [name], DOB [birth year], MRN [id number] PCP: [place], No  [facility] Providers Cardiologist:  Powell FORBES Sorrow, MD (Inactive) {  History of Present Illness:   [name] is a 83 y.o. female  with a hx of HTN, atrial flutter, and GERD who was previously followed Dr. Sorrow.  She has had rheumatic heart disease with previously mild MR and AS.  However, echo in March demonstrated an EF of 65% with moderately elevated pulmonary pressures and AS that is at least moderate.  She has mild AI.  This is my first visit with her.    She has no symptoms per her report.  She lives alone.  She does her own household chores including vacuuming.  She has no chest pressure, neck or arm discomfort.  She has no weight gain or edema.  She actually thinks she has been doing pretty good.  She does not feel her fibrillation.  She seems to be tolerating her anticoagulation.   ROS: As stated in the HPI and negative for all other systems.  Studies Reviewed:    EKG:   EKG Interpretation Date/Time:  Friday July 20 2023 09:02:08 EDT Ventricular Rate:  85 PR Interval:    QRS Duration:  82 QT Interval:  366 QTC Calculation: 435 R Axis:   31  Text Interpretation: Atrial fibrillation with a competing junctional pacemaker Septal infarct , age undetermined When compared with ECG of 27-Aug-2021 13:37,  lateral T wave inversion is no longer present Confirmed by Lavona Agent ([id number]) on 07/20/2023 9:13:52 AM     Risk Assessment/Calculations:    CHA2DS2-VASc Score = 4   This indicates a 4.8% annual risk of stroke. The patient's score is based upon: CHF History: 0 HTN History: 1 Diabetes History: 0 Stroke History: 0 Vascular Disease History: 0 Age Score: 2 Gender Score: 1  Physical Exam:   VS:  BP (!) 146/68   Pulse 85   Ht 5' 4 (1.626 m)   Wt 183 lb (83 kg)   SpO2 96%   BMI 31.41 kg/m    Wt Readings from Last 3  Encounters:  07/20/23 183 lb (83 kg)  01/31/22 193 lb 6.4 oz (87.7 kg)  12/15/21 193 lb (87.5 kg)     GEN: Well nourished, well developed in no acute distress NECK: No JVD; No carotid bruits CARDIAC: Irregular RR, 3 out of 6 systolic murmur radiating at the aortic outflow tract and late peaking, no diastolic murmurs, rubs, gallops RESPIRATORY:  Clear to auscultation without rales, wheezing or rhonchi  ABDOMEN: Soft, non-tender, non-distended EXTREMITIES:  No edema; No deformity   ASSESSMENT AND PLAN:   Persistent Afib: She does not notice this.  She seems to have good rate control.  She tolerates anticoagulation.  I will check a CBC and a basic metabolic profile.     HTN: Her blood pressure is mildly elevated but this is unusual.  No change in therapy.  She can keep a blood pressure diary.   Rheumatic heart disease/AS/MR: The AS gradients seem to be about the same although it was suggested visually this looked more severe.  She is not having any symptoms.  I compared the readings from 2024-2025 and there is not a significant change.  I will have her come back in 6 months to make sure she is not having any new symptoms.  We will keep a close eye on this and I will plan  a repeat echo in March.   Coronary calcification on CT chest: She is having no new symptoms.  No change in therapy.    Follow up Mercy Hails, PAc, PhD in six months.   Signed, Lynwood Schilling, MD

## 2023-07-20 ENCOUNTER — Encounter: Payer: Self-pay | Admitting: Cardiology

## 2023-07-20 ENCOUNTER — Ambulatory Visit: Attending: Cardiology | Admitting: Cardiology

## 2023-07-20 VITALS — BP 146/68 | HR 85 | Ht 64.0 in | Wt 183.0 lb

## 2023-07-20 DIAGNOSIS — I099 Rheumatic heart disease, unspecified: Secondary | ICD-10-CM | POA: Diagnosis not present

## 2023-07-20 DIAGNOSIS — I10 Essential (primary) hypertension: Secondary | ICD-10-CM

## 2023-07-20 DIAGNOSIS — I2510 Atherosclerotic heart disease of native coronary artery without angina pectoris: Secondary | ICD-10-CM

## 2023-07-20 DIAGNOSIS — I4892 Unspecified atrial flutter: Secondary | ICD-10-CM

## 2023-07-20 DIAGNOSIS — I4891 Unspecified atrial fibrillation: Secondary | ICD-10-CM

## 2023-07-20 LAB — CBC

## 2023-07-20 NOTE — Patient Instructions (Signed)
 Medication Instructions:  Your physician recommends that you continue on your current medications as directed. Please refer to the Current Medication list given to you today.  *If you need a refill on your cardiac medications before your next appointment, please call your pharmacy*  Lab Work: BMET, CBC today If you have labs (blood work) drawn today and your tests are completely normal, you will receive your results only by: MyChart Message (if you have MyChart) OR A paper copy in the mail If you have any lab test that is abnormal or we need to change your treatment, we will call you to review the results.  Testing/Procedures: NONE  Follow-Up: At [facility], you and your health needs are our priority.  As part of our continuing mission to provide you with exceptional heart care, our providers are all part of one team.  This team includes your primary Cardiologist (physician) and Advanced Practice Providers or APPs (Physician Assistants and Nurse Practitioners) who all work together to provide you with the care you need, when you need it.  Your next appointment:   6 month(s)  Provider:   [place], MD  We recommend signing up for the patient portal called MyChart.  Sign up information is provided on this After Visit Summary.  MyChart is used to connect with patients for Virtual Visits (Telemedicine).  Patients are able to view lab/test results, encounter notes, upcoming appointments, etc.  Non-urgent messages can be sent to your provider as well.   To learn more about what you can do with MyChart, go to [email or web address].   Other Instructions Blood pressure diary: take your blood pressure twice daily for 10 days and send us  the readings via MyChart.

## 2023-07-21 ENCOUNTER — Ambulatory Visit: Payer: Self-pay | Admitting: Cardiology

## 2023-07-21 DIAGNOSIS — I10 Essential (primary) hypertension: Secondary | ICD-10-CM

## 2023-07-21 LAB — CBC
Hematocrit: 43.4 (ref 34.0–46.6)
Hemoglobin: 14.7 g/dL (ref 11.1–15.9)
MCH: 31.3 pg (ref 26.6–33.0)
MCHC: 33.9 g/dL (ref 31.5–35.7)
MCV: 93 fL (ref 79–97)
Platelets: 249 x10E3/uL (ref 150–450)
RBC: 4.69 x10E6/uL (ref 3.77–5.28)
RDW: 12.7 (ref 11.7–15.4)
WBC: 6.6 x10E3/uL (ref 3.4–10.8)

## 2023-07-21 LAB — BASIC METABOLIC PANEL WITH GFR
BUN/Creatinine Ratio: 19 (ref 12–28)
BUN: 14 mg/dL (ref 8–27)
CO2: 20 mmol/L (ref 20–29)
Calcium: 9.9 mg/dL (ref 8.7–10.3)
Chloride: 102 mmol/L (ref 96–106)
Creatinine, Ser: 0.74 mg/dL (ref 0.57–1.00)
Glucose: 82 mg/dL (ref 70–99)
Potassium: 4.2 mmol/L (ref 3.5–5.2)
Sodium: 139 mmol/L (ref 134–144)
eGFR: 80 mL/min/1.73 (ref >59)

## 2023-07-24 MED ORDER — POTASSIUM CHLORIDE CRYS ER 20 MEQ PO TBCR: 20.0000 meq | EXTENDED_RELEASE_TABLET | Freq: Every day | ORAL | 3 refills | Status: AC

## 2023-07-24 NOTE — Telephone Encounter (Signed)
 Spoke with pt regarding Dr. Denver suggestion to take 20 meq daily of KCL. Pt agreeable. Prescription sent to pt's pharmacy of choice. Pt verbalized understanding. And all questions if any were answered.

## 2023-07-24 NOTE — Telephone Encounter (Signed)
-----   Message from Lynwood Schilling sent at 07/21/2023  9:42 AM EDT ----- Please send in a prescription for KCL 20 meq po daily.  Disp 90 with 3 refills.  ----- Message ----- From: Rebecka Memos Lab Results In Sent: 07/20/2023  11:35 PM EDT To: Lynwood Schilling, MD

## 2023-12-25 ENCOUNTER — Encounter: Payer: Self-pay | Admitting: Cardiology
